# Patient Record
Sex: Male | Born: 1955 | Race: Black or African American | Hispanic: No | Marital: Married | Smoking: Former smoker
Health system: Southern US, Community
[De-identification: ages and names within clinical notes are randomized; demographics above are authoritative.]

## PROBLEM LIST (undated history)

## (undated) DIAGNOSIS — K2100 Gastro-esophageal reflux disease with esophagitis, without bleeding: Secondary | ICD-10-CM

## (undated) DIAGNOSIS — K219 Gastro-esophageal reflux disease without esophagitis: Secondary | ICD-10-CM

## (undated) DIAGNOSIS — R609 Edema, unspecified: Secondary | ICD-10-CM

## (undated) DIAGNOSIS — R079 Chest pain, unspecified: Secondary | ICD-10-CM

## (undated) DIAGNOSIS — J849 Interstitial pulmonary disease, unspecified: Secondary | ICD-10-CM

## (undated) DIAGNOSIS — Z720 Tobacco use: Secondary | ICD-10-CM

## (undated) DIAGNOSIS — K921 Melena: Secondary | ICD-10-CM

## (undated) DIAGNOSIS — N529 Male erectile dysfunction, unspecified: Secondary | ICD-10-CM

## (undated) DIAGNOSIS — F1010 Alcohol abuse, uncomplicated: Secondary | ICD-10-CM

## (undated) DIAGNOSIS — I351 Nonrheumatic aortic (valve) insufficiency: Secondary | ICD-10-CM

## (undated) DIAGNOSIS — R634 Abnormal weight loss: Secondary | ICD-10-CM

## (undated) DIAGNOSIS — D126 Benign neoplasm of colon, unspecified: Secondary | ICD-10-CM

## (undated) DIAGNOSIS — F17200 Nicotine dependence, unspecified, uncomplicated: Secondary | ICD-10-CM

## (undated) DIAGNOSIS — R911 Solitary pulmonary nodule: Secondary | ICD-10-CM

## (undated) DIAGNOSIS — D509 Iron deficiency anemia, unspecified: Secondary | ICD-10-CM

## (undated) DIAGNOSIS — D500 Iron deficiency anemia secondary to blood loss (chronic): Secondary | ICD-10-CM

## (undated) DIAGNOSIS — R64 Cachexia: Secondary | ICD-10-CM

## (undated) HISTORY — DX: Solitary pulmonary nodule: R91.1

## (undated) HISTORY — DX: Nicotine dependence, unspecified, uncomplicated: F17.200

## (undated) HISTORY — DX: Alcohol abuse, uncomplicated: F10.10

## (undated) HISTORY — DX: Iron deficiency anemia, unspecified: D50.9

## (undated) HISTORY — DX: Nonrheumatic aortic (valve) insufficiency: I35.1

## (undated) HISTORY — DX: Tobacco use: Z72.0

## (undated) HISTORY — DX: Edema, unspecified: R60.9

## (undated) HISTORY — DX: Gastro-esophageal reflux disease with esophagitis: K21.0

## (undated) HISTORY — DX: Male erectile dysfunction, unspecified: N52.9

## (undated) HISTORY — DX: Gastro-esophageal reflux disease with esophagitis, without bleeding: K21.00

## (undated) HISTORY — DX: Iron deficiency anemia secondary to blood loss (chronic): D50.0

## (undated) HISTORY — DX: Abnormal weight loss: R63.4

## (undated) HISTORY — DX: Interstitial pulmonary disease, unspecified: J84.9

## (undated) HISTORY — DX: Benign neoplasm of colon, unspecified: D12.6

## (undated) HISTORY — DX: Cachexia: R64

## (undated) SURGERY — EGD (ESOPHAGOGASTRODUODENOSCOPY)
Anesthesia: Moderate Sedation

---

## 2007-10-08 ENCOUNTER — Ambulatory Visit: Payer: Self-pay | Admitting: Family Medicine

## 2008-07-04 ENCOUNTER — Ambulatory Visit: Payer: Self-pay | Admitting: Family Medicine

## 2011-09-09 DIAGNOSIS — D500 Iron deficiency anemia secondary to blood loss (chronic): Secondary | ICD-10-CM

## 2011-09-09 HISTORY — DX: Iron deficiency anemia secondary to blood loss (chronic): D50.0

## 2011-10-01 ENCOUNTER — Inpatient Hospital Stay (HOSPITAL_COMMUNITY): Payer: Self-pay

## 2011-10-01 ENCOUNTER — Other Ambulatory Visit: Payer: Self-pay

## 2011-10-01 ENCOUNTER — Inpatient Hospital Stay (HOSPITAL_COMMUNITY)
Admission: EM | Admit: 2011-10-01 | Discharge: 2011-10-06 | DRG: 378 | Disposition: A | Discharge status: Home / Self Care | Payer: Self-pay | Attending: Internal Medicine | Admitting: Internal Medicine

## 2011-10-01 ENCOUNTER — Encounter (HOSPITAL_COMMUNITY): Payer: Self-pay | Admitting: Emergency Medicine

## 2011-10-01 ENCOUNTER — Emergency Department (HOSPITAL_COMMUNITY): Payer: Self-pay

## 2011-10-01 DIAGNOSIS — F1010 Alcohol abuse, uncomplicated: Secondary | ICD-10-CM | POA: Diagnosis present

## 2011-10-01 DIAGNOSIS — K222 Esophageal obstruction: Secondary | ICD-10-CM | POA: Diagnosis present

## 2011-10-01 DIAGNOSIS — I472 Ventricular tachycardia, unspecified: Secondary | ICD-10-CM | POA: Diagnosis not present

## 2011-10-01 DIAGNOSIS — R0600 Dyspnea, unspecified: Secondary | ICD-10-CM

## 2011-10-01 DIAGNOSIS — Z720 Tobacco use: Secondary | ICD-10-CM

## 2011-10-01 DIAGNOSIS — R634 Abnormal weight loss: Secondary | ICD-10-CM

## 2011-10-01 DIAGNOSIS — F17200 Nicotine dependence, unspecified, uncomplicated: Secondary | ICD-10-CM | POA: Diagnosis present

## 2011-10-01 DIAGNOSIS — K209 Esophagitis, unspecified without bleeding: Secondary | ICD-10-CM | POA: Diagnosis present

## 2011-10-01 DIAGNOSIS — R0989 Other specified symptoms and signs involving the circulatory and respiratory systems: Secondary | ICD-10-CM | POA: Diagnosis present

## 2011-10-01 DIAGNOSIS — I4729 Other ventricular tachycardia: Secondary | ICD-10-CM | POA: Diagnosis not present

## 2011-10-01 DIAGNOSIS — R0609 Other forms of dyspnea: Secondary | ICD-10-CM | POA: Diagnosis present

## 2011-10-01 DIAGNOSIS — D72829 Elevated white blood cell count, unspecified: Secondary | ICD-10-CM

## 2011-10-01 DIAGNOSIS — R1310 Dysphagia, unspecified: Secondary | ICD-10-CM | POA: Diagnosis present

## 2011-10-01 DIAGNOSIS — D126 Benign neoplasm of colon, unspecified: Secondary | ICD-10-CM | POA: Diagnosis present

## 2011-10-01 DIAGNOSIS — D500 Iron deficiency anemia secondary to blood loss (chronic): Secondary | ICD-10-CM | POA: Diagnosis present

## 2011-10-01 DIAGNOSIS — E872 Acidosis, unspecified: Secondary | ICD-10-CM | POA: Diagnosis present

## 2011-10-01 DIAGNOSIS — G8929 Other chronic pain: Secondary | ICD-10-CM | POA: Diagnosis present

## 2011-10-01 DIAGNOSIS — F1210 Cannabis abuse, uncomplicated: Secondary | ICD-10-CM | POA: Diagnosis present

## 2011-10-01 DIAGNOSIS — R531 Weakness: Secondary | ICD-10-CM

## 2011-10-01 DIAGNOSIS — E871 Hypo-osmolality and hyponatremia: Secondary | ICD-10-CM | POA: Diagnosis present

## 2011-10-01 DIAGNOSIS — E876 Hypokalemia: Secondary | ICD-10-CM | POA: Diagnosis present

## 2011-10-01 DIAGNOSIS — R911 Solitary pulmonary nodule: Secondary | ICD-10-CM | POA: Diagnosis present

## 2011-10-01 DIAGNOSIS — D509 Iron deficiency anemia, unspecified: Secondary | ICD-10-CM

## 2011-10-01 DIAGNOSIS — R195 Other fecal abnormalities: Secondary | ICD-10-CM

## 2011-10-01 DIAGNOSIS — D128 Benign neoplasm of rectum: Secondary | ICD-10-CM | POA: Diagnosis present

## 2011-10-01 DIAGNOSIS — K5730 Diverticulosis of large intestine without perforation or abscess without bleeding: Secondary | ICD-10-CM | POA: Diagnosis present

## 2011-10-01 DIAGNOSIS — K922 Gastrointestinal hemorrhage, unspecified: Principal | ICD-10-CM | POA: Diagnosis present

## 2011-10-01 HISTORY — DX: Gastro-esophageal reflux disease without esophagitis: K21.9

## 2011-10-01 LAB — RAPID URINE DRUG SCREEN, HOSP PERFORMED
Amphetamines: NOT DETECTED
Benzodiazepines: NOT DETECTED
Cocaine: NOT DETECTED
Opiates: NOT DETECTED
Tetrahydrocannabinol: POSITIVE — AB

## 2011-10-01 LAB — CBC
MCH: 13.8 pg — ABNORMAL LOW (ref 26.0–34.0)
MCV: 58.3 fL — ABNORMAL LOW (ref 78.0–100.0)
Platelets: 403 10*3/uL — ABNORMAL HIGH (ref 150–400)
RDW: 23.9 % — ABNORMAL HIGH (ref 11.5–15.5)
WBC: 25 10*3/uL — ABNORMAL HIGH (ref 4.0–10.5)

## 2011-10-01 LAB — BASIC METABOLIC PANEL
Calcium: 8.3 mg/dL — ABNORMAL LOW (ref 8.4–10.5)
Creatinine, Ser: 0.46 mg/dL — ABNORMAL LOW (ref 0.50–1.35)
GFR calc Af Amer: >90 mL/min (ref >90)
GFR calc non Af Amer: >90 mL/min (ref >90)
Sodium: 131 mEq/L — ABNORMAL LOW (ref 135–145)

## 2011-10-01 LAB — BLOOD GAS, ARTERIAL
Acid-base deficit: 1.4 mmol/L (ref 0.0–2.0)
Bicarbonate: 21.8 mEq/L (ref 20.0–24.0)
TCO2: 22.7 mmol/L (ref 0–100)
pCO2 arterial: 30.5 mmHg — ABNORMAL LOW (ref 35.0–45.0)
pO2, Arterial: 125 mmHg — ABNORMAL HIGH (ref 80.0–100.0)

## 2011-10-01 LAB — OCCULT BLOOD, POC DEVICE: Fecal Occult Bld: POSITIVE

## 2011-10-01 LAB — DIFFERENTIAL
Basophils Absolute: 0 10*3/uL (ref 0.0–0.1)
Lymphs Abs: 2.3 10*3/uL (ref 0.7–4.0)
Monocytes Absolute: 1.5 10*3/uL — ABNORMAL HIGH (ref 0.1–1.0)
Monocytes Relative: 6 % (ref 3–12)
Neutrophils Relative %: 84 % — ABNORMAL HIGH (ref 43–77)

## 2011-10-01 LAB — MRSA PCR SCREENING: MRSA by PCR: NEGATIVE

## 2011-10-01 LAB — ABO/RH: ABO/RH(D): O POS

## 2011-10-01 MED ORDER — ONDANSETRON HCL 4 MG/2ML IJ SOLN: 4.0000 mg | Freq: Four times a day (QID) | INTRAMUSCULAR | Status: DC | PRN

## 2011-10-01 MED ORDER — HYDROMORPHONE HCL PF 1 MG/ML IJ SOLN: 0.5000 mg | INTRAMUSCULAR | Status: AC | PRN

## 2011-10-01 MED ORDER — IOHEXOL 300 MG/ML  SOLN
90.0000 mL | Freq: Once | INTRAMUSCULAR | Status: AC | PRN
  Administered 2011-10-01: 90 mL via INTRAVENOUS

## 2011-10-01 MED ORDER — FOLIC ACID 1 MG PO TABS
1.0000 mg | ORAL_TABLET | Freq: Every day | ORAL | Status: DC
  Administered 2011-10-01 – 2011-10-06 (×5): 1 mg via ORAL
  Filled 2011-10-01 (×6): qty 1

## 2011-10-01 MED ORDER — NICOTINE 21 MG/24HR TD PT24
21.0000 mg | MEDICATED_PATCH | Freq: Every day | TRANSDERMAL | Status: DC
  Administered 2011-10-01 – 2011-10-06 (×6): 21 mg via TRANSDERMAL
  Filled 2011-10-01 (×6): qty 1

## 2011-10-01 MED ORDER — PANTOPRAZOLE SODIUM 40 MG PO TBEC
40.0000 mg | DELAYED_RELEASE_TABLET | Freq: Every day | ORAL | Status: DC
  Administered 2011-10-02 – 2011-10-06 (×5): 40 mg via ORAL
  Filled 2011-10-01 (×5): qty 1

## 2011-10-01 MED ORDER — VITAMIN B-1 100 MG PO TABS
100.0000 mg | ORAL_TABLET | Freq: Every day | ORAL | Status: DC
  Administered 2011-10-01 – 2011-10-06 (×5): 100 mg via ORAL
  Filled 2011-10-01 (×6): qty 1

## 2011-10-01 MED ORDER — POTASSIUM CHLORIDE 10 MEQ/100ML IV SOLN
10.0000 meq | INTRAVENOUS | Status: AC
  Administered 2011-10-01 (×4): 10 meq via INTRAVENOUS
  Filled 2011-10-01 (×4): qty 100

## 2011-10-01 MED ORDER — ALBUTEROL SULFATE (5 MG/ML) 0.5% IN NEBU: 2.5000 mg | INHALATION_SOLUTION | RESPIRATORY_TRACT | Status: AC | PRN

## 2011-10-01 MED ORDER — ONDANSETRON HCL 4 MG/2ML IJ SOLN: 4.0000 mg | Freq: Three times a day (TID) | INTRAMUSCULAR | Status: DC | PRN

## 2011-10-01 MED ORDER — ENSURE CLINICAL ST REVIGOR PO LIQD
237.0000 mL | Freq: Three times a day (TID) | ORAL | Status: DC
  Administered 2011-10-01 – 2011-10-06 (×10): 237 mL via ORAL

## 2011-10-01 MED ORDER — ONDANSETRON HCL 4 MG PO TABS: 4.0000 mg | ORAL_TABLET | Freq: Four times a day (QID) | ORAL | Status: DC | PRN

## 2011-10-01 MED ORDER — ACETAMINOPHEN 650 MG RE SUPP: 650.0000 mg | Freq: Four times a day (QID) | RECTAL | Status: DC | PRN

## 2011-10-01 MED ORDER — ACETAMINOPHEN 325 MG PO TABS
650.0000 mg | ORAL_TABLET | Freq: Four times a day (QID) | ORAL | Status: DC | PRN
  Administered 2011-10-01: 325 mg via ORAL
  Filled 2011-10-01: qty 1

## 2011-10-01 MED ORDER — SODIUM CHLORIDE 0.9 % IV SOLN
Freq: Once | INTRAVENOUS | Status: AC
  Administered 2011-10-01: 12:00:00 via INTRAVENOUS

## 2011-10-01 MED ORDER — SODIUM CHLORIDE 0.9 % IV SOLN
INTRAVENOUS | Status: AC
  Administered 2011-10-01: 1000 mL via INTRAVENOUS

## 2011-10-01 MED ORDER — ADULT MULTIVITAMIN W/MINERALS CH
1.0000 | ORAL_TABLET | Freq: Every day | ORAL | Status: DC
  Administered 2011-10-01 – 2011-10-06 (×5): 1 via ORAL
  Filled 2011-10-01 (×6): qty 1

## 2011-10-01 MED ORDER — PANTOPRAZOLE SODIUM 40 MG IV SOLR
40.0000 mg | Freq: Once | INTRAVENOUS | Status: AC
  Administered 2011-10-01: 40 mg via INTRAVENOUS
  Filled 2011-10-01: qty 40

## 2011-10-01 MED ORDER — BISACODYL 5 MG PO TBEC
10.0000 mg | DELAYED_RELEASE_TABLET | Freq: Four times a day (QID) | ORAL | Status: DC
  Filled 2011-10-01 (×3): qty 2

## 2011-10-01 MED ORDER — PANTOPRAZOLE SODIUM 40 MG IV SOLR
40.0000 mg | INTRAVENOUS | Status: DC
  Filled 2011-10-01: qty 40

## 2011-10-01 MED ORDER — POLYETHYLENE GLYCOL 3350 17 GM/SCOOP PO POWD
1.0000 | Freq: Once | ORAL | Status: DC
  Filled 2011-10-01: qty 255

## 2011-10-01 NOTE — Progress Notes (Signed)
INITIAL ADULT NUTRITION ASSESSMENT Date: 10/01/2011   Time: 4:43 PM  Reason for Assessment: Nutrition Consult  ASSESSMENT: Male 56 y.o.  Dx: Anemia due to GI blood loss, microcytic anemia, leukocytosis- GI has seen with plans for EGD and Colonoscopy Friday  Hx:  Past Medical History  Diagnosis Date  . No pertinent past medical history     Related Meds:     . sodium chloride   Intravenous Once  . sodium chloride   Intravenous STAT  . bisacodyl  10 mg Oral Q6H  . folic acid  1 mg Oral Daily  . mulitivitamin with minerals  1 tablet Oral Daily  . nicotine  21 mg Transdermal Daily  . pantoprazole  40 mg Oral Q0600  . pantoprazole (PROTONIX) IV  40 mg Intravenous Once  . polyethylene glycol powder  1 Container Oral Once  . potassium chloride  10 mEq Intravenous Q1 Hr x 4  . thiamine  100 mg Oral Daily  . DISCONTD: pantoprazole (PROTONIX) IV  40 mg Intravenous Q24H     Ht: 5\' 9"  (175.3 cm)  Wt: 97 lb 3.6 oz (44.1 kg)  Ideal Wt:  73.6 kg % Ideal Wt: 59.9%  Usual Wt: 65.9 kg % Usual Wt: 66.9%  Body mass index is 14.36 kg/(m^2). Underweight  Food/Nutrition Related Hx: Patient reported a moderate appetite, but experiences an "acid-reflux" like pain in chest that makes it difficult to keep foods down. He also stated that his coughing interfered with his ease of swallowing foods.  He reported a 48 lb weight loss over a year.  Labs:  CMP     Component Value Date/Time   NA 131* 10/01/2011 1008   K 3.1* 10/01/2011 1008   CL 92* 10/01/2011 1008   CO2 13* 10/01/2011 1008   GLUCOSE 123* 10/01/2011 1008   BUN 4* 10/01/2011 1008   CREATININE 0.46* 10/01/2011 1008   CALCIUM 8.3* 10/01/2011 1008   GFRNONAA >90 10/01/2011 1008   GFRAA >90 10/01/2011 1008    Intake:   Total I/O In: 820 [P.O.:120; Blood:700] Out: -    Diet Order: Regular  Supplements/Tube Feeding: N/A  IVF: N/A  Estimated Nutritional Needs:   Kcal:1500-1600 kcal Protein: 75-85 gm Fluid: > 1.5 L  Patient  reported to consume most foods at home, but needs to eat foods in small pieces to assist with swallowing. He seemed eager to eat lunch tray.    Patient weight loss of 33.1% usual body weight over one year and < 75% of estimated energy requirements meets criteria for severe malnutrition in context of chronic illness  NUTRITION DIAGNOSIS: -Inadequate oral intake (NI-2.1).  Status: Ongoing  RELATED TO: difficulty keeping foods down/altered GI function  AS EVIDENCE BY: 48lb weight loss over year  MONITORING/EVALUATION(Goals): Goal: Consume >/= 90% of estimated needs Monitor: PO intake, weight, electrolytes  EDUCATION NEEDS: -No education needs identified at this time  INTERVENTION: Supplement Ensure Complete with meal TID  Dietitian #:[phone]  DOCUMENTATION CODES Per approved criteria  -Severe malnutrition in the context of chronic illness -Underweight    Lloyd Huger 10/01/2011, 4:43 PM Derrell Lolling Anastasia Fiedler [phone]

## 2011-10-01 NOTE — ED Notes (Signed)
Pt back to hallway, sample to lab per PA.

## 2011-10-01 NOTE — Consult Note (Addendum)
[facility] Consult: 2:25 PM 10/01/2011   Referring Provider: S. Cleotis Lema  Primary Care Physician:  Dr. Susann Givens, but pt lost his medical insurance so has not seen him in over one year. Primary Gastroenterologist:   Gentry Fitz.  Reason for Consultation:  Microcytic anemia, Hgb of 3.5.  Stool FOB positive.   HPI: James [name] is a 56 y.o. male.  Unremarkable past medical and surgical history.  For several months having progressive DOE/fatigue.  Also several months of severe pyrosis, early satiety, solid dysphagia.  More recently having nocturnal vomiting which is non-bloody.  Today, on way to courthouse to deal with home forclosure proceedings, was unable to take more than a few steps without profound exhaustion and SOB.  He was dizzy.  No chest pain.  At ED Hgb is 3.5 with MCV in the 50s.  Starting on transfusions of 4 ordered PRBCs.  BUN is normal. Reports average weight a few years back of 155, currently 97 #, BMI 14.    He does not take ASA or NSAIDS, drinks 2 beers a day.  No unusual blood clotting problems or increased bruising.  No prior hx of abnormal liver tests or anemia.   No family hx of stomach or intestinal problems.   Past Medical History  Diagnosis Date  . No pertinent past medical history     History reviewed. No pertinent past surgical history.  Prior to Admission medications   None    Scheduled Meds:    . sodium chloride   Intravenous Once  . sodium chloride   Intravenous STAT  . folic acid  1 mg Oral Daily  . mulitivitamin with minerals  1 tablet Oral Daily  . pantoprazole (PROTONIX) IV  40 mg Intravenous Once  . pantoprazole (PROTONIX) IV  40 mg Intravenous Q24H  . potassium chloride  10 mEq Intravenous Q1 Hr x 4  . thiamine  100 mg Oral Daily   Infusions:   PRN Meds: acetaminophen, acetaminophen, albuterol, HYDROmorphone (DILAUDID) injection, ondansetron (ZOFRAN) IV, ondansetron, DISCONTD: ondansetron (ZOFRAN)  IV   Allergies as of 10/01/2011  . (No Known Allergies)    History reviewed. No pertinent family history.  History   Social History  . Marital Status: Married    Spouse Name: N/A    Number of Children: N/A  . Years of Education: N/A   Occupational History  . Unemployed, previously worked as a Facilities manager   Social History Main Topics  . Smoking status: Current Everyday Smoker -- 1.0 packs/day for 20 years    Types: Cigarettes  . Smokeless tobacco: Never Used  . Alcohol Use: 1.2 oz/week    2 Cans of beer per day  . Drug Use: No  . Sexually Active: Not Currently   Social History Narrative  . His home is being forclosed (Jan 2013)    REVIEW OF SYSTEMS: Constitutional:  above ENT:  No nose bleeding Pulm:  No cough CV:  Tachycardia with minor exertion.  No edema. GU:  No dark colored or bloody urine. GI:  Above. Heme:  above.    Transfusions:  never Neuro:  No headaches.  Dizzyness in last few days. Derm:  No rash, itching or sores Endocrine:  No history blood sugar problems Immunization:  Not current on flu shot. Travel:  None.   PHYSICAL EXAM: Vital signs in last 24 hours: Temp:  [97.4 F (36.3 C)-98 F (36.7 C)] 97.6 F (36.4 C) (01/23 1415) Pulse Rate:  [96-114] 96  (01/23 1415) Resp:  [15-19]  16  (01/23 1415) BP: (94-133)/(52-78) 108/58 mmHg (01/23 1415) SpO2:  [100 %] 100 % (01/23 1400) Weight:  [44.1 kg (97 lb 3.6 oz)-58.968 kg (130 lb)] 44.1 kg (97 lb 3.6 oz) (01/23 1321)  Last BM Date: 09/30/11  General: cachectic, pale AA man.  Looks chronically unwell. Head:  Normocephallic, atraumatic  Eyes:  Pale conjunctiva, muddy sclera Ears:  Not HOH  Nose:  No discharge. Mouth:  Missing teeth, pink and moist MM Neck:  No JVD.  No mass, no goiter Lungs:  Clear B.  Not dyspneic, no cough Heart: Regular rhythm.  Tacchy.  Slight systolic murmur. Abdomen:  Soft, NT, fullness vs mass in mid abdomen.  Active BS.  No HSM Rectal: dark brown, non-melenic but  trace FOB positive stool.   Musc/Skeltl: no swollen or deformed joints.   Extremities:  Muscle wasting of limbs Neurologic:  No tremor, oriented x 3.  Moves all 4s. Skin:  Pale, no rash or sores Tattoos:  none Nodes:  None at neck   Psych:  Pleasant, not anxious.  Cooperative.  LAB RESULTS:  Basename 10/01/11 1008  WBC 25.0*  HGB 3.5*  HCT 14.8*  PLT 403*   BMET Lab Results  Component Value Date   NA 131* 10/01/2011   K 3.1* 10/01/2011   CL 92* 10/01/2011   CO2 13* 10/01/2011   GLUCOSE 123* 10/01/2011   BUN 4* 10/01/2011   CREATININE 0.46* 10/01/2011   CALCIUM 8.3* 10/01/2011   LFT No results found for this basename: prot, albumin, ast, alt, alkphos, bilitot, bilidir, ibili   PT/INR No results found for this basename: INR, PROTIME   Hepatitis Panel No results found for this basename: HEPBSAG,HCVAB,HEPAIGM,HEPBIGM in the last 72 hours C-Diff No components found with this basename: cdiff    Drugs of Abuse  No results found for this basename: labopia, cocainscrnur, labbenz, amphetmu, thcu, labbarb     RADIOLOGY STUDIES: Dg Chest 2 View  10/01/2011  *RADIOLOGY REPORT*  Clinical Data: Shortness of breath, smoker.  CHEST - 2 VIEW  Comparison: None  Findings: Heart and mediastinal contours are within normal limits. No focal opacities or effusions.  No acute bony abnormality.  IMPRESSION: No active cardiopulmonary disease.  Original Report Authenticated By: Cyndie Chime, M.D.    ENDOSCOPIC STUDIES: Flex sig within last 5 years at Dr Jola Babinski office.  IMPRESSION: 1.  Profound, symptomatic microcytic anemia.  4 units of PRBCs ordered to transfuse. 2.  Heme positive stool.  GI ROS suggests upper GI source (pyrosis, dysphagia, early satiety, nocturnal vomiting).  He has not ever had a full colonoscopy however.  His weight loss is concerning for a cancer.  3.  Hypokalemia and hyponatremia.  4.  Leukocytosis. ? Reactive.  No sxs to suggest active infection.  Afebrile.  5.  Mild  hyperglycemia.   PLAN: 1.  EGD and Colonoscopy Friday at noon.   2.  Protime/inr and LFTs today.  3.  PPI coverage with once daily protonix, will change to pills. 4.  Solid food today, start clears tomorrow.      LOS: 0 days   Jennye Moccasin  10/01/2011, 2:25 PM Pager: [phone]  GI ATTENDING  HISTORY REVIEWED. PATIENT SEEN AND EXAMINED. LABS AND XRAYS REVIEWED. FAMILY IN ROOM. AGREE W/ ABOVE H/P. PRESENTS WITH PROFOUND MICROCYTIC ANEMIA, 30# WEIGHT LOSS, DYSPHAGIA, AND HEME+ STOOL. CHRONIC SMOKER / DRINKER. I'M VERY CONCERNED ABOUT ESOPHAGEAL CANCER. PLAN EGD TOMORROW. IF NEGATIVE, COLONOSCOPY Friday. The nature of the procedure, as well as the  risks, benefits, and alternatives were carefully and thoroughly reviewed with the patient. Ample time for discussion and questions allowed. The patient understood, was satisfied, and agreed to proceed.   Wilhemina Bonito. Eda Keys., M.D. [facility] Division of Gastroenterology       NO INTERVAL CHANGE. FOR EGD. Wilhemina Bonito. Eda Keys., M.D. [facility] Division of Gastroenterology

## 2011-10-01 NOTE — H&P (Addendum)
PCP:  Sharlot Gowda DOA:  10/01/2011  9:28 AM  Chief Complaint:  Generalized weakness and dyspnea  HPI: James [name] is a 56 years old African American man with no significant past medical history presented to the ER today with chief complaint of generalized weakness and dyspnea with exertion. He denies any cough, nasal congestion, fever or chills. He stated that he noticed dark stool for about a month. But denies any red blood per rectum. He also denies any hematemesis or abdominal pain. Patient stated that he had a colonoscopy about 5 years ago and was told was unremarkable. He noticed about 30 pounds weight loss over the last couple of years. He was last seen by a physician about 3 years ago. In the ED patient was found to be anemic with a hemoglobin of 3.5, his stool was positive for occult blood. I was called to admit for further management.  Allergies: No Known Allergies  Prior to Admission medications   None     Past Medical History  Diagnosis Date  . No pertinent past medical history     Family History : Denies family history of cancers or GI pathology. Positive family history for emphysema. Social History: Lives with his wife, reports that he has been smoking Cigarettes.  He has a 20 pack-year smoking history.Marland Kitchen He reports that he drinks about 3-4 beers 3-4 days a week . He reports that he does not use illicit drugs.  History reviewed. No pertinent family history.  Review of Systems:  Constitutional: Denies fever, chills, diaphoresis, appetite change  HEENT: Denies photophobia, eye pain, redness, hearing loss, ear pain, congestion, sore throat, rhinorrhea, sneezing, mouth sores, trouble swallowing, neck pain, neck stiffness and tinnitus.   Respiratory: C/O  SOB, DOE, denies cough, chest tightness,  and wheezing.   Cardiovascular: Denies chest pain, palpitations and leg swelling.  Gastrointestinal: Denies nausea, vomiting, abdominal pain, diarrhea, constipation, abdominal  distention.  complaint of black stools Genitourinary: Denies dysuria, urgency, frequency, hematuria, flank pain and difficulty urinating.  Musculoskeletal: Denies myalgias,  joint swelling, arthralgias and gait problem.  complain of lower back pain on and off for more than a year  Neurological: Denies dizziness, seizures, syncope, focal weakness, light-headedness, numbness and headaches.  Hematological: Denies adenopathy. Easy bruising, personal or family bleeding history .complain of occasional nosebleed with blowing his nose  Psychiatric/Behavioral: Denies suicidal ideation, mood changes, confusion, nervousness, sleep disturbance and agitation   Physical Exam:  Filed Vitals:   10/01/11 1242 10/01/11 1321 10/01/11 1330 10/01/11 1345  BP: 94/52 115/67 113/70 122/70  Pulse: 103 103 109 109  Temp: 97.7 F (36.5 C) 97.4 F (36.3 C)    TempSrc: Oral Oral    Resp: 18 18 19 16   Height:  5\' 9"  (1.753 m)    Weight:  44.1 kg (97 lb 3.6 oz)    SpO2:  100% 100% 100%    Constitutional: Vital signs reviewed.  Patient is cachectic in no acute distress and cooperative with exam. Alert and oriented x3.  Head: Normocephalic and atraumatic Ear: TM normal bilaterally Mouth: no erythema or exudates, MMM Eyes: PERRL, EOMI, conjunctivae normal, No scleral icterus.  pale Neck: Supple, Trachea midline normal ROM, No JVD, mass, thyromegaly, or carotid bruit present.  Cardiovascular: RRR, S1 normal, S2 normal, tachycardic no MRG, pulses symmetric and intact bilaterally Pulmonary/Chest: CTAB, no wheezes, rales, or rhonchi Abdominal: Soft. Non-tender, non-distended, bowel sounds are normal, no masses, organomegaly, or guarding present.  GU: no CVA tenderness Musculoskeletal: No joint deformities,  erythema, or stiffness, ROM full and no nontender Ext: no edema and no cyanosis, pulses palpable bilaterally (DP and PT) Neurological: A&O x3, Strenght is normal and symmetric bilaterally, cranial nerve II-XII are  grossly intact, no focal motor deficit, sensory intact to light touch bilaterally.    Labs on Admission:  Results for orders placed during the hospital encounter of 10/01/11 (from the past 48 hour(s))  D-DIMER, QUANTITATIVE     Status: Abnormal   Collection Time   10/01/11  9:55 AM      Component Value Range Comment   D-Dimer, Quant 1.33 (*) 0.00 - 0.48 (ug/mL-FEU)   CBC     Status: Abnormal   Collection Time   10/01/11 10:08 AM      Component Value Range Comment   WBC 25.0 (*) 4.0 - 10.5 (K/uL)    RBC 2.54 (*) 4.22 - 5.81 (MIL/uL)    Hemoglobin 3.5 (*) 13.0 - 17.0 (g/dL)    HCT 96.0 (*) 45.4 - 52.0 (%)    MCV 58.3 (*) 78.0 - 100.0 (fL)    MCH 13.8 (*) 26.0 - 34.0 (pg)    MCHC 23.6 (*) 30.0 - 36.0 (g/dL)    RDW 09.8 (*) 11.9 - 15.5 (%)    Platelets 403 (*) 150 - 400 (K/uL)   DIFFERENTIAL     Status: Abnormal   Collection Time   10/01/11 10:08 AM      Component Value Range Comment   Neutrophils Relative 84 (*) 43 - 77 (%)    Lymphocytes Relative 9 (*) 12 - 46 (%)    Monocytes Relative 6  3 - 12 (%)    Eosinophils Relative 1  0 - 5 (%)    Basophils Relative 0  0 - 1 (%)    Neutro Abs 20.9 (*) 1.7 - 7.7 (K/uL)    Lymphs Abs 2.3  0.7 - 4.0 (K/uL)    Monocytes Absolute 1.5 (*) 0.1 - 1.0 (K/uL)    Eosinophils Absolute 0.3  0.0 - 0.7 (K/uL)    Basophils Absolute 0.0  0.0 - 0.1 (K/uL)    RBC Morphology POLYCHROMASIA PRESENT     BASIC METABOLIC PANEL     Status: Abnormal   Collection Time   10/01/11 10:08 AM      Component Value Range Comment   Sodium 131 (*) 135 - 145 (mEq/L)    Potassium 3.1 (*) 3.5 - 5.1 (mEq/L)    Chloride 92 (*) 96 - 112 (mEq/L)    CO2 13 (*) 19 - 32 (mEq/L)    Glucose, Bld 123 (*) 70 - 99 (mg/dL)    BUN 4 (*) 6 - 23 (mg/dL)    Creatinine, Ser 1.47 (*) 0.50 - 1.35 (mg/dL)    Calcium 8.3 (*) 8.4 - 10.5 (mg/dL)    GFR calc non Af Amer >90  >90 (mL/min)    GFR calc Af Amer >90  >90 (mL/min)   TYPE AND SCREEN     Status: Normal (Preliminary result)    Collection Time   10/01/11 11:10 AM      Component Value Range Comment   ABO/RH(D) O POS      Antibody Screen NEG      Sample Expiration 10/04/2011      Unit Number [id number]      Blood Component Type RED CELLS,LR      Unit division 00      Status of Unit ISSUED      Transfusion Status OK TO TRANSFUSE  Crossmatch Result Compatible      Unit Number [id number]      Blood Component Type RED CELLS,LR      Unit division 00      Status of Unit ALLOCATED      Transfusion Status OK TO TRANSFUSE      Crossmatch Result Compatible      Unit Number [id number]      Blood Component Type RED CELLS,LR      Unit division 00      Status of Unit ALLOCATED      Transfusion Status OK TO TRANSFUSE      Crossmatch Result Compatible      Unit Number [id number]      Blood Component Type RED CELLS,LR      Unit division 00      Status of Unit ALLOCATED      Transfusion Status OK TO TRANSFUSE      Crossmatch Result Compatible      Unit Number [id number]      Blood Component Type RED CELLS,LR      Unit division 00      Status of Unit ALLOCATED      Transfusion Status OK TO TRANSFUSE      Crossmatch Result Compatible      Unit Number [id number]      Blood Component Type RED CELLS,LR      Unit division 00      Status of Unit ALLOCATED      Transfusion Status OK TO TRANSFUSE      Crossmatch Result Compatible     PREPARE RBC (CROSSMATCH)     Status: Normal   Collection Time   10/01/11 11:10 AM      Component Value Range Comment   Order Confirmation ORDER PROCESSED BY BLOOD BANK     ABO/RH     Status: Normal   Collection Time   10/01/11 11:10 AM      Component Value Range Comment   ABO/RH(D) O POS     OCCULT BLOOD, POC DEVICE     Status: Normal   Collection Time   10/01/11 11:57 AM      Component Value Range Comment   Fecal Occult Bld POSITIVE       Radiological Exams on Admission: CHEST - 2 VIEW  Comparison: None  Findings: Heart and mediastinal contours are within normal limits.  No focal  opacities or effusions. No acute bony abnormality.  IMPRESSION:  No active cardiopulmonary disease.   Assessment/Plan Principal Problem:  *Anemia due to GI blood loss Transfuse 4 units of packed RBCs, monitor serial H&H Would keep patient on clear liquid diet for now, continue Protonix IV LB  GI was consulted Active Problems:  Weakness generalized Most likely secondary to symptomatic anemia. Request PT eval when more stable. Anion gap metabolic acidosis Possibly secondary to alcohol versus compensatory mechanism for her respiratory process. Patient denies any ingestion of any substance other than alcohol. He also denies any diarrhea which should give non-anion gap acidosis. will check ethanol level, will check lactic acid level, acetaminophen and aspirin level. I would also check serum osmolality to calculate osmolar gap. Check ABG and urine drug screen.  Hypokalemia Replete, check magnesium level  Hyponatremia Probably secondary to dehydration, however SIADH is not excluded given his smoking history Check urine sodium, urine osmolality. Add on serum osmolality, check TSH and random cortisol. Continue to monitor sodium level and treat according to studies results.  Dyspnea Probably secondary to symptomatic anemia however d-dimer  was also noted to be elevated, will check CTA chest to rule out PE. Leukocytosis Most likely reactive secondary to severe anemia, patient is afebrile we'll continue to observe. Chronic back pain No neurological deficit appreciated on exam. Given severe anemia I will also check urine and serum protein electrophoresis. Consider imaging study if any worsening. Weight loss Rule out malignant process. Would start with GI evaluation as above if no abnormality consider CT chest and abdomen. Alcohol abuse Will place patient on CIWA protocol, folate, thiamine and multivitamin. Tobacco abuse Would order for nicotine patch, smoking cessation counseling CODE  STATUS Full code   Time Spent on Admission: 50 minutes  Dashay Giesler 10/01/2011, 1:49 PM

## 2011-10-01 NOTE — ED Notes (Signed)
Lab results reported to PA. 

## 2011-10-01 NOTE — ED Notes (Signed)
Pt moved to room cdu 2 for PA to do rectal exam and will return to hallway.

## 2011-10-01 NOTE — Progress Notes (Signed)
Pt to CT at 1805hrs, CT tolerated without problem, RN with pt.  Returned from CT at 1845hrs.  3rd unit of blood to start.

## 2011-10-01 NOTE — ED Notes (Signed)
Pt is very thin but states is his "normal".

## 2011-10-01 NOTE — ED Provider Notes (Signed)
History     CSN: [id number]  Arrival date & time 10/01/11  1478   First MD Initiated Contact with Patient 10/01/11 [phone]      Chief Complaint  Patient presents with  . Shortness of Breath    sob and general weakness on and off for "a while' but worse this morning. denies any medical history.    (Consider location/radiation/quality/duration/timing/severity/associated sxs/prior treatment) Patient is a 56 y.o. male presenting with shortness of breath. The history is provided by the patient.  Shortness of Breath  The current episode started more than 2 weeks ago. The onset was gradual. The problem occurs frequently. The problem has been gradually worsening. The problem is moderate. The symptoms are relieved by rest. The symptoms are aggravated by activity. Associated symptoms include cough and shortness of breath. Pertinent negatives include no chest pain, no chest pressure, no orthopnea, no fever, no rhinorrhea, no sore throat, no stridor and no wheezing. He has had no prior hospitalizations. His past medical history does not include asthma or past wheezing. There were no sick contacts.   Pt has had dyspnea over the past several weeks which has been worsening. States this gets worse with activity and improves with rest. He does feel that since this started, he has gradually become more winded with activity and has to rest more frequently when walking. He feels generally weak.  He is a smoker, smoking approx 1 ppd. Has a morning cough at baseline; has not noted any change in this. Denies hx of cardiopulmonary disease or other chronic medical conditions. Denies fevers.  Patient has no prior history of DVT/PE. Denies recent trauma, surgery, or prolonged immobilization. Denies hemoptysis. Denies leg swelling/pain.  History reviewed. No pertinent past medical history.  History reviewed. No pertinent past surgical history.  History reviewed. No pertinent family history.  History  Substance Use  Topics  . Smoking status: Current Everyday Smoker -- 1.0 packs/day  . Smokeless tobacco: Not on file  . Alcohol Use: 1.2 oz/week    2 Cans of beer per week      Review of Systems  Constitutional: Negative for fever, chills, appetite change and unexpected weight change.  HENT: Negative for congestion, sore throat and rhinorrhea.   Respiratory: Positive for cough and shortness of breath. Negative for chest tightness, wheezing and stridor.   Cardiovascular: Negative for chest pain, palpitations and orthopnea.  Gastrointestinal: Negative for vomiting, abdominal pain and blood in stool.  Genitourinary: Negative for dysuria.  Musculoskeletal: Negative for myalgias.  Skin: Negative for color change.  Neurological: Positive for weakness.    Allergies  Review of patient's allergies indicates no known allergies.  Home Medications  No current outpatient prescriptions on file.  BP 133/60  Pulse 112  Temp(Src) 97.9 F (36.6 C) (Oral)  Resp 18  Ht 5\' 9"  (1.753 m)  Wt 130 lb (58.968 kg)  BMI 19.20 kg/m2  SpO2 100%  Physical Exam  Nursing note and vitals reviewed. Constitutional: He is oriented to person, place, and time. He appears well-developed. No distress.       Cachetic appearing. Pt states this is normal for him.  HENT:  Head: Normocephalic and atraumatic.  Right Ear: External ear normal.  Left Ear: External ear normal.  Eyes: EOM are normal.  Neck: Normal range of motion.  Cardiovascular: Normal rate, regular rhythm and normal heart sounds.  Exam reveals no gallop and no friction rub.   No murmur heard. Pulmonary/Chest: Effort normal and breath sounds normal. No respiratory distress.  He has no wheezes. He has no rales. He exhibits no tenderness.  Abdominal: Soft. There is no tenderness.  Genitourinary: Rectal exam shows no mass. Guaiac positive stool.       RN chaperone present during exam Dark brown stool seen  Musculoskeletal: Normal range of motion. He exhibits no  edema.  Neurological: He is alert and oriented to person, place, and time.  Skin: Skin is warm and dry. He is not diaphoretic.  Psychiatric: He has a normal mood and affect.    ED Course  Procedures (including critical care time)   Date: 10/01/2011 0958  Rate: 105  Rhythm: sinus tachycardia  QRS Axis: normal  Intervals: normal  ST/T Wave abnormalities: normal  Conduction Disutrbances:none  Narrative Interpretation: "noisy" appearing ECG. Questionable R atrial enlargement.  Old EKG Reviewed: none available  Labs Reviewed  D-DIMER, QUANTITATIVE - Abnormal; Notable for the following:    D-Dimer, Quant 1.33 (*)    All other components within normal limits  CBC - Abnormal; Notable for the following:    WBC 25.0 (*)    RBC 2.54 (*)    Hemoglobin 3.5 (*)    HCT 14.8 (*)    MCV 58.3 (*)    MCH 13.8 (*)    MCHC 23.6 (*)    RDW 23.9 (*)    Platelets 403 (*)    All other components within normal limits  DIFFERENTIAL - Abnormal; Notable for the following:    Neutrophils Relative 84 (*)    Lymphocytes Relative 9 (*)    Neutro Abs 20.9 (*)    Monocytes Absolute 1.5 (*)    All other components within normal limits  BASIC METABOLIC PANEL - Abnormal; Notable for the following:    Sodium 131 (*)    Potassium 3.1 (*)    Chloride 92 (*)    CO2 13 (*)    Glucose, Bld 123 (*)    BUN 4 (*)    Creatinine, Ser 0.46 (*)    Calcium 8.3 (*)    All other components within normal limits  TYPE AND SCREEN  PREPARE RBC (CROSSMATCH)  ABO/RH  OCCULT BLOOD, POC DEVICE  POCT OCCULT BLOOD STOOL, DEVICE   Dg Chest 2 View  10/01/2011  *RADIOLOGY REPORT*  Clinical Data: Shortness of breath, smoker.  CHEST - 2 VIEW  Comparison: None  Findings: Heart and mediastinal contours are within normal limits. No focal opacities or effusions.  No acute bony abnormality.  IMPRESSION: No active cardiopulmonary disease.  Original Report Authenticated By: Cyndie Chime, M.D.     1. Anemia due to GI blood loss         MDM  12:34 PM Pt with hemoglobin of 3.5 which is the most likely explanation for his dyspnea. Stool hemoccult positive but no evidence of gross blood. Pt denies any abd pain though states he has had looser stools lately. Denies rectal pain or BRBPR. D dimer is elevated which was ordered before H/H value was known; suspect this is attributable to H/H. Do not feel that CT angio chest clinically necessary at this point. Discussed with [facility]. Will admit to stepdown given hgb. GI consult requested. Page placed.        Grant Fontana, [place] 10/01/11 [phone]

## 2011-10-01 NOTE — Progress Notes (Signed)
2 units of blood infused, CT of abd to be done.  Per Dr. Cleotis Lema will do now and then start 3rd unit of blood.

## 2011-10-01 NOTE — ED Notes (Signed)
Pt states general weakness and sob for several weeks. Denies cough. Denies fever

## 2011-10-02 ENCOUNTER — Encounter (HOSPITAL_COMMUNITY): Payer: Self-pay

## 2011-10-02 ENCOUNTER — Encounter (HOSPITAL_COMMUNITY)
Admission: EM | Disposition: A | Discharge status: Home / Self Care | Payer: Self-pay | Source: Home / Self Care | Attending: Internal Medicine

## 2011-10-02 DIAGNOSIS — D500 Iron deficiency anemia secondary to blood loss (chronic): Secondary | ICD-10-CM

## 2011-10-02 HISTORY — PX: ESOPHAGOGASTRODUODENOSCOPY: SHX5428

## 2011-10-02 LAB — CBC
HCT: 29.3 % — ABNORMAL LOW (ref 39.0–52.0)
Hemoglobin: 9.7 g/dL — ABNORMAL LOW (ref 13.0–17.0)
MCH: 22.8 pg — ABNORMAL LOW (ref 26.0–34.0)
MCHC: 32 g/dL (ref 30.0–36.0)
MCV: 71.8 fL — ABNORMAL LOW (ref 78.0–100.0)
Platelets: 276 10*3/uL (ref 150–400)
RBC: 4.08 MIL/uL — ABNORMAL LOW (ref 4.22–5.81)
RDW: 28.7 % — ABNORMAL HIGH (ref 11.5–15.5)
WBC: 19.2 10*3/uL — ABNORMAL HIGH (ref 4.0–10.5)

## 2011-10-02 LAB — COMPREHENSIVE METABOLIC PANEL
ALT: 17 U/L (ref 0–53)
AST: 33 U/L (ref 0–37)
Alkaline Phosphatase: 262 U/L — ABNORMAL HIGH (ref 39–117)
CO2: 24 mEq/L (ref 19–32)
Chloride: 99 mEq/L (ref 96–112)
GFR calc non Af Amer: >90 mL/min (ref >90)
Glucose, Bld: 91 mg/dL (ref 70–99)
Sodium: 131 mEq/L — ABNORMAL LOW (ref 135–145)
Total Bilirubin: 1.4 mg/dL — ABNORMAL HIGH (ref 0.3–1.2)

## 2011-10-02 LAB — LACTIC ACID, PLASMA: Lactic Acid, Venous: 1.3 mmol/L (ref 0.5–2.2)

## 2011-10-02 LAB — TSH: TSH: 4.302 u[IU]/mL (ref 0.350–4.500)

## 2011-10-02 LAB — ETHANOL: Alcohol, Ethyl (B): <11 mg/dL (ref 0–11)

## 2011-10-02 LAB — SALICYLATE LEVEL: Salicylate Lvl: <2 mg/dL — ABNORMAL LOW (ref 2.8–20.0)

## 2011-10-02 SURGERY — EGD (ESOPHAGOGASTRODUODENOSCOPY)
Anesthesia: Moderate Sedation

## 2011-10-02 MED ORDER — FENTANYL CITRATE 0.05 MG/ML IJ SOLN
INTRAMUSCULAR | Status: AC
  Filled 2011-10-02: qty 4

## 2011-10-02 MED ORDER — BUTAMBEN-TETRACAINE-BENZOCAINE 2-2-14 % EX AERO
INHALATION_SPRAY | CUTANEOUS | Status: DC | PRN
  Administered 2011-10-02: 2 via TOPICAL

## 2011-10-02 MED ORDER — MIDAZOLAM HCL 10 MG/2ML IJ SOLN
INTRAMUSCULAR | Status: AC
  Filled 2011-10-02: qty 4

## 2011-10-02 MED ORDER — POTASSIUM CHLORIDE IN NACL 20-0.9 MEQ/L-% IV SOLN
INTRAVENOUS | Status: DC
  Administered 2011-10-02 – 2011-10-03 (×4): via INTRAVENOUS
  Filled 2011-10-02 (×6): qty 1000

## 2011-10-02 MED ORDER — SODIUM CHLORIDE 0.9 % IV SOLN: Freq: Once | INTRAVENOUS | Status: DC

## 2011-10-02 MED ORDER — FENTANYL NICU IV SYRINGE 50 MCG/ML
INJECTION | INTRAMUSCULAR | Status: DC | PRN
  Administered 2011-10-02 (×3): 25 ug via INTRAVENOUS

## 2011-10-02 MED ORDER — PEG-KCL-NACL-NASULF-NA ASC-C 100 G PO SOLR
1.0000 | Freq: Once | ORAL | Status: AC
  Administered 2011-10-02: 100 g via ORAL
  Filled 2011-10-02 (×2): qty 1

## 2011-10-02 MED ORDER — BISACODYL 5 MG PO TBEC
10.0000 mg | DELAYED_RELEASE_TABLET | Freq: Once | ORAL | Status: AC
  Administered 2011-10-02: 10 mg via ORAL
  Filled 2011-10-02: qty 2

## 2011-10-02 MED ORDER — MIDAZOLAM HCL 10 MG/2ML IJ SOLN
INTRAMUSCULAR | Status: DC | PRN
  Administered 2011-10-02 (×3): 2 mg via INTRAVENOUS

## 2011-10-02 NOTE — Op Note (Signed)
SEE PENTAX REPORT. PLAN COLONOSCOPY TOMORROW

## 2011-10-02 NOTE — Progress Notes (Signed)
Pt smokes 1 ppd and is in contemplation stage. Discussed with pt the risk factors of smoking and its effects on his health. Pt voices understanding. He wants to think more about it however and is not ready to quit now. Advised and encouraged pt to quit. Referred to 1-800 quit now for f/u and support. Discussed oral fixation substitutes, second hand smoke and in home smoking policy. Reviewed and gave pt Written education/contact information.

## 2011-10-02 NOTE — Op Note (Signed)
[facility] [address] [place], [place]  [address]  ENDOSCOPY PROCEDURE REPORT  PATIENT:  James [name], James [name]  MR#:  [id number] BIRTHDATE:  [birth year], 55 yrs. old  GENDER:  male  ENDOSCOPIST:  Wilhemina Bonito. Eda Keys, MD Referred by:  [facility],  PROCEDURE DATE:  10/02/2011 PROCEDURE:  EGD, diagnostic 43235 ASA CLASS:  Class II INDICATIONS:  iron deficiency anemia, dysphagia, weight loss  MEDICATIONS:   Fentanyl 75 mcg IV, Versed 6 mg IV TOPICAL ANESTHETIC:  Cetacaine Spray  DESCRIPTION OF PROCEDURE:   After the risks benefits and alternatives of the procedure were thoroughly explained, informed consent was obtained.  The Pentax Gastroscope [id number] endoscope was introduced through the mouth and advanced to the second portion of the duodenum, without limitations.  The instrument was slowly withdrawn as the mucosa was fully examined. <<PROCEDUREIMAGES>>  Mild Esophagitis was found in the distal esophagus nd benign large caliber stricture.  Otherwise normal esophagus.  The stomach was entered and closely examined. The antrum, angularis, and lesser curvature were well visualized, including a retroflexed view of the cardia and fundus. The stomach wall was normally distensable. The scope passed easily through the pylorus into the duodenum. The duodenal bulb was normal in appearance, as was the postbulbar duodenum.    Retroflexed views revealed no abnormalities.    The scope was then withdrawn from the patient and the procedure completed.  COMPLICATIONS:  None  ENDOSCOPIC IMPRESSION: 1) Esophagitis in the distal esophagus 2) Stricture in the distal esophagus 3) Otherwise normal esophagus 4) Normal stomach 5) Normal duodenum  RECOMMENDATIONS: 1) Anti-reflux regimen to be followed 2) PROTONIX 40 MGDAILY 3) Plan to  schedule a colonoscopy for tomorrow  ______________________________ Wilhemina Bonito. Eda Keys, MD  CC:  The Patient.;   Hospitalists  n. eSIGNED:   Wilhemina Bonito. Eda Keys at 10/02/2011 12:58 PM  [place], [phone]

## 2011-10-02 NOTE — Progress Notes (Signed)
Utilization review completed. James [name] 10/02/2011 

## 2011-10-02 NOTE — Progress Notes (Addendum)
Subjective: Chart reviewed. Patient says he feels a whole lot better. He feels stronger and denies dyspnea, chest pain or dizziness. He says his last BM was 2-3 days ago and it was "black brown". Denies pain.  Objective: Blood pressure 103/55, pulse 74, temperature 98.1 F (36.7 C), temperature source Oral, resp. rate 19, height 5\' 9"  (1.753 m), weight 54.1 kg (119 lb 4.3 oz), SpO2 100.00%.  Intake/Output Summary (Last 24 hours) at 10/02/11 0812 Last data filed at 10/02/11 0400  Gross per 24 hour  Intake 2901.67 ml  Output   1100 ml  Net 1801.67 ml    General Exam: Comfortable. Looks older than stated age. Respiratory System: Clear. No increased work of breathing. Cardiovascular System: First and second heart sounds heard. Regular rate and rhythm. No JVD/murmurs. Telemetry shows sinus rhythm in the 70s to 80s. Gastrointestinal System: Abdomen is non distended, soft and normal bowel sounds heard. Central Nervous System: Alert and oriented. No focal neurological deficits.  Lab Results: Basic Metabolic Panel:  Basename 10/01/11 1008  NA 131*  K 3.1*  CL 92*  CO2 13*  GLUCOSE 123*  BUN 4*  CREATININE 0.46*  CALCIUM 8.3*  MG --  PHOS --   Liver Function Tests: No results found for this basename: AST:2,ALT:2,ALKPHOS:2,BILITOT:2,PROT:2,ALBUMIN:2 in the last 72 hours No results found for this basename: LIPASE:2,AMYLASE:2 in the last 72 hours No results found for this basename: AMMONIA:2 in the last 72 hours CBC:  Basename 10/02/11 0645 10/01/11 1008  WBC 19.4* 25.0*  NEUTROABS -- 20.9*  HGB 9.7* 3.5*  HCT 30.3* 14.8*  MCV 71.1* 58.3*  PLT 276 403*   Cardiac Enzymes: No results found for this basename: CKTOTAL:3,CKMB:3,CKMBINDEX:3,TROPONINI:3 in the last 72 hours BNP: No results found for this basename: PROBNP:3 in the last 72 hours D-Dimer:  Alvira Philips 10/01/11 0955  DDIMER 1.33*   CBG: No results found for this basename: GLUCAP:6 in the last 72 hours Hemoglobin  A1C: No results found for this basename: HGBA1C in the last 72 hours Fasting Lipid Panel: No results found for this basename: CHOL,HDL,LDLCALC,TRIG,CHOLHDL,LDLDIRECT in the last 72 hours Thyroid Function Tests: No results found for this basename: TSH,T4TOTAL,FREET4,T3FREE,THYROIDAB in the last 72 hours Anemia Panel: No results found for this basename: VITAMINB12,FOLATE,FERRITIN,TIBC,IRON,RETICCTPCT in the last 72 hours Coagulation:  Basename 10/02/11 0645  LABPROT 15.3*  INR 1.18   Urine Drug Screen: Drugs of Abuse     Component Value Date/Time   LABOPIA NONE DETECTED 10/01/2011 1712   COCAINSCRNUR NONE DETECTED 10/01/2011 1712   LABBENZ NONE DETECTED 10/01/2011 1712   AMPHETMU NONE DETECTED 10/01/2011 1712   THCU POSITIVE* 10/01/2011 1712   LABBARB NONE DETECTED 10/01/2011 1712    Alcohol Level: No results found for this basename: ETH:2 in the last 72 hours  Micro Results: Recent Results (from the past 240 hour(s))  MRSA PCR SCREENING     Status: Normal   Collection Time   10/01/11  1:15 PM      Component Value Range Status Comment   MRSA by PCR NEGATIVE  NEGATIVE  Final     Studies/Results: Dg Chest 2 View  10/01/2011  *RADIOLOGY REPORT*  Clinical Data: Shortness of breath, smoker.  CHEST - 2 VIEW  Comparison: None  Findings: Heart and mediastinal contours are within normal limits. No focal opacities or effusions.  No acute bony abnormality.  IMPRESSION: No active cardiopulmonary disease.  Original Report Authenticated By: Cyndie Chime, M.D.   Ct Angio Chest W/cm &/or Wo Cm  10/01/2011  *  RADIOLOGY REPORT*  Clinical Data: Shortness of breath.  Evaluate for pulmonary embolism.  CT ANGIOGRAPHY CHEST  Technique:  Multidetector CT imaging of the chest using the standard protocol during bolus administration of intravenous contrast. Multiplanar reconstructed images including MIPs were obtained and reviewed to evaluate the vascular anatomy.  Contrast: 90mL OMNIPAQUE IOHEXOL 300 MG/ML  IV SOLN  Comparison: Chest x-ray 10/01/2011.  Findings:  Mediastinum:  No filling defects are noted within the pulmonary arterial tree to suggest pulmonary emboli.  Heart size is normal. No pericardial fluid, thickening or calcification.  No acute abnormality of the thoracic aorta or other great vessels of the mediastinum.  There is atherosclerosis of the thoracic aorta, the great vessels of the mediastinum and the coronary arteries, including calcified atherosclerotic plaque in the left anterior descending left circumflex and right coronary arteries. No pathologically enlarged mediastinal or hilar lymph nodes. Mild circumferential thickening of the distal esophagus.  Lungs/Pleura:  No consolidative airspace disease.  No pleural effusions.  Images 59-61 of series 5 demonstrates a 7 mm nodular density in the left lower lobe.  This does not appear to be a parenchymal nodule, but rather appears to be associated with the bronchi extending to the posterobasal segment of the right lower lobe.  No additional suspicious appearing pulmonary nodules or masses are otherwise identified. However, there is a diffuse pattern of very fine centrilobular ground-glass attenuation micronodularity.  Upper Abdomen:  Visualized portions of the upper abdomen are unremarkable.  Musculoskeletal:  No aggressive appearing lytic or blastic lesions are noted in the visualized portions of the skeleton.  IMPRESSION:  1.  No evidence of pulmonary embolism. 2.  Pattern of a very fine centrilobular ground-glass attenuation micronodularity in the lungs bilaterally.  Appearance is suggestive of a smoking related disease such as RB (respiratory bronchiolitis), or if the patient is symptomatic, RB-ILD (respiratory bronchiolitis interstitial lung disease). 3. 7 mm nodular density in the right lower lobe, as above.  It is uncertain whether not this represents a parenchymal lung nodule, or, if this may represent an endobronchial lesion within the bronchi  extending to the posterior basal segment of the right lower lobe.  At this time, there are no postobstructive changes in the right lower lobe.  If the patient is at high risk for bronchogenic carcinoma, follow-up chest CT at 3-6 months is recommended.  If the patient is at low risk for bronchogenic carcinoma, follow-up chest CT at 6-12 months is recommended.  This recommendation follows the consensus statement: Guidelines for Management of Small Pulmonary Nodules Detected on CT Scans: A Statement from the Fleischner Society as published in Radiology 2005; 237:395-400. Online at: DietDisorder.cz. 4. Atherosclerosis, including three-vessel coronary artery disease. Please note that although the presence of coronary artery calcium documents the presence of coronary artery disease, the severity of this disease and any potential stenosis cannot be assessed on this non-gated CT examination.  Assessment for potential risk factor modification, dietary therapy or pharmacologic therapy may be warranted, if clinically indicated.  These results will be called to the ordering clinician or representative by the Radiologist Assistant, and communication documented in the PACS Dashboard.  Original Report Authenticated By: Florencia Reasons, M.D.    Medications: Scheduled Meds:    . sodium chloride   Intravenous Once  . sodium chloride   Intravenous STAT  . bisacodyl  10 mg Oral Q6H  . feeding supplement  237 mL Oral TID WC  . folic acid  1 mg Oral Daily  . mulitivitamin with  minerals  1 tablet Oral Daily  . nicotine  21 mg Transdermal Daily  . pantoprazole  40 mg Oral Q0600  . pantoprazole (PROTONIX) IV  40 mg Intravenous Once  . polyethylene glycol powder  1 Container Oral Once  . potassium chloride  10 mEq Intravenous Q1 Hr x 4  . thiamine  100 mg Oral Daily  . DISCONTD: pantoprazole (PROTONIX) IV  40 mg Intravenous Q24H   Continuous Infusions:  PRN Meds:.acetaminophen,  acetaminophen, albuterol, HYDROmorphone (DILAUDID) injection, iohexol, ondansetron (ZOFRAN) IV, ondansetron, DISCONTD: ondansetron (ZOFRAN) IV  Assessment/Plan: 1. Symptomatic severe microcytic anemia, possibly of iron deficiency secondary to slow/intermittent GI blood loss: Status post 4 units of packed red blood cell transfusion on 10/01/2011. Improved. GI workup is in progress. 2. Slow GI bleeding: GI input appreciated. Ruling out malignancy among other causes. Suspecting upper GI source. EGD today and if negative for colonoscopy tomorrow. Continue proton pump inhibitors. 3. Anion gap metabolic acidosis: Unclear etiology. Followup CMP that is pending. Normal saline hydration. Blood alcohol level, salicylate, lactate levels are pending. Creatinine and blood sugars look okay. 4. Hypokalemia: Follow up CMP and replete as needed. 5. Hyponatremia:? Secondary to dehydration versus SIADH. Continue normal saline hydration. Followup CMP, serum and urine osmolarity. 6. Tobacco abuse: Cessation counseling. Continue nicotine patch 7. Substance abuse/tetrahydrocannabinol: Substance abuse counseling 8. Abnormal CT scan of the chest: very fine centrilobular ground-glass attenuation micronodularity in the lungs bilaterally. ? Respiratory bronchiolitis interstitial lung disease. Patient asymptomatic. Smoking cessation advised. Outpatient followup. 9. Right lower lobe pulmonary nodule on CT scan of the chest in patient with history of tobacco abuse: Tobacco cessation. Followup CT scan of the chest in 3-6 months. Consider pulmonary consultation at some point. 10. Leukocytosis:? Secondary to iron deficiency anemia. Improved. No clinical focus of infection. Monitor 11. Alcohol use/?? Abuse: Continue multivitamins and check CIWA scores. Currently not on Ativan protocol. No features of withdrawal at this time.  Reviewed labs. Metabolic acidosis has resolved. Potassium and magnesium are normal. Serum lactate, acetaminophen  levels and blood alcohol levels negative. Mild hyponatremia.  Rubby Barbary 10/02/2011, 8:12 AM

## 2011-10-02 NOTE — ED Provider Notes (Signed)
Medical screening examination/treatment/procedure(s) were performed by non-physician practitioner and as supervising physician I was immediately available for consultation/collaboration. Tin Engram Y.   Lynzi Meulemans Y. Lummie Montijo, MD 10/02/11 0707 

## 2011-10-02 NOTE — Progress Notes (Signed)
Pt very sleepy, unable sustain awakeness for more than few mini.  Will Move all ext, grip equal and follows simple command with eye closed. unsafe for any po med at this time. High risk for aspiration. Will keep pt npo until fully awake. James [name]

## 2011-10-03 ENCOUNTER — Other Ambulatory Visit: Payer: Self-pay | Admitting: Internal Medicine

## 2011-10-03 ENCOUNTER — Encounter (HOSPITAL_COMMUNITY)
Admission: EM | Disposition: A | Discharge status: Home / Self Care | Payer: Self-pay | Source: Home / Self Care | Attending: Internal Medicine

## 2011-10-03 ENCOUNTER — Encounter (HOSPITAL_COMMUNITY): Payer: Self-pay | Admitting: Internal Medicine

## 2011-10-03 DIAGNOSIS — D126 Benign neoplasm of colon, unspecified: Secondary | ICD-10-CM | POA: Clinically undetermined

## 2011-10-03 DIAGNOSIS — R000 Tachycardia, unspecified: Secondary | ICD-10-CM

## 2011-10-03 HISTORY — PX: COLONOSCOPY: SHX5424

## 2011-10-03 LAB — UIFE/LIGHT CHAINS/TP QN, 24-HR UR
Alpha 1, Urine: DETECTED — AB
Alpha 2, Urine: DETECTED — AB
Free Kappa Lt Chains,Ur: 53.8 mg/dL — ABNORMAL HIGH (ref 0.14–2.42)
Free Kappa/Lambda Ratio: 8.72 ratio (ref 2.04–10.37)
Gamma Globulin, Urine: DETECTED — AB
Total Protein, Urine: 65.2 mg/dL

## 2011-10-03 LAB — CBC
HCT: 26.9 % — ABNORMAL LOW (ref 39.0–52.0)
Hemoglobin: 8.3 g/dL — ABNORMAL LOW (ref 13.0–17.0)
MCH: 22.2 pg — ABNORMAL LOW (ref 26.0–34.0)
MCHC: 30.9 g/dL (ref 30.0–36.0)
MCV: 71.9 fL — ABNORMAL LOW (ref 78.0–100.0)

## 2011-10-03 LAB — BASIC METABOLIC PANEL
BUN: 4 mg/dL — ABNORMAL LOW (ref 6–23)
Calcium: 7.5 mg/dL — ABNORMAL LOW (ref 8.4–10.5)
GFR calc non Af Amer: >90 mL/min (ref >90)
Glucose, Bld: 78 mg/dL (ref 70–99)

## 2011-10-03 SURGERY — COLONOSCOPY
Anesthesia: Moderate Sedation

## 2011-10-03 MED ORDER — POTASSIUM CHLORIDE CRYS ER 20 MEQ PO TBCR
EXTENDED_RELEASE_TABLET | ORAL | Status: AC
  Administered 2011-10-03: 40 meq via ORAL
  Filled 2011-10-03: qty 2

## 2011-10-03 MED ORDER — POTASSIUM CHLORIDE CRYS ER 20 MEQ PO TBCR
EXTENDED_RELEASE_TABLET | ORAL | Status: AC
  Filled 2011-10-03: qty 1

## 2011-10-03 MED ORDER — POTASSIUM CHLORIDE CRYS ER 20 MEQ PO TBCR
40.0000 meq | EXTENDED_RELEASE_TABLET | Freq: Once | ORAL | Status: AC
  Administered 2011-10-03: 40 meq via ORAL

## 2011-10-03 MED ORDER — MIDAZOLAM HCL 10 MG/2ML IJ SOLN
INTRAMUSCULAR | Status: AC
  Filled 2011-10-03: qty 2

## 2011-10-03 MED ORDER — POTASSIUM CHLORIDE CRYS ER 20 MEQ PO TBCR
20.0000 meq | EXTENDED_RELEASE_TABLET | Freq: Once | ORAL | Status: AC
  Administered 2011-10-03: 20 meq via ORAL
  Filled 2011-10-03: qty 1

## 2011-10-03 MED ORDER — MIDAZOLAM HCL 10 MG/2ML IJ SOLN
INTRAMUSCULAR | Status: DC | PRN
  Administered 2011-10-03: 1 mg via INTRAVENOUS
  Administered 2011-10-03 (×2): 2 mg via INTRAVENOUS
  Administered 2011-10-03: 1 mg via INTRAVENOUS
  Administered 2011-10-03: 2 mg via INTRAVENOUS

## 2011-10-03 MED ORDER — POTASSIUM CHLORIDE CRYS ER 20 MEQ PO TBCR
20.0000 meq | EXTENDED_RELEASE_TABLET | Freq: Once | ORAL | Status: AC
  Administered 2011-10-03: 20 meq via ORAL

## 2011-10-03 MED ORDER — FENTANYL CITRATE 0.05 MG/ML IJ SOLN
INTRAMUSCULAR | Status: AC
  Filled 2011-10-03: qty 2

## 2011-10-03 MED ORDER — FENTANYL CITRATE 0.05 MG/ML IJ SOLN
INTRAMUSCULAR | Status: DC | PRN
  Administered 2011-10-03 (×4): 25 ug via INTRAVENOUS

## 2011-10-03 NOTE — Progress Notes (Signed)
1 unit PRBCS given without problem.  Echo done at bedside.  Pt taken for colonoscopy at 1210hrs.

## 2011-10-03 NOTE — Progress Notes (Signed)
Subjective: Denies abdominal pain or nausea vomiting. Had bowel prep overnight for colonoscopy today. Watery brown stools. Denies blood or black-colored stools. At times feels weak on standing. Denies dizziness or lightheadedness. No chest pain, dyspnea or palpitations.  Objective: Blood pressure 127/56, pulse 84, temperature 97.6 F (36.4 C), temperature source Oral, resp. rate 23, height 5\' 9"  (1.753 m), weight 48.2 kg (106 lb 4.2 oz), SpO2 100.00%.  Intake/Output Summary (Last 24 hours) at 10/03/11 0757 Last data filed at 10/03/11 0700  Gross per 24 hour  Intake   1200 ml  Output    400 ml  Net    800 ml    General Exam: Comfortable. Looks older than stated age. Respiratory System: Clear. No increased work of breathing. Cardiovascular System: First and second heart sounds heard. Regular rate and rhythm. No JVD/murmurs. Telemetry shows sinus rhythm. One episode of 6 beat wide complex tachycardia? Nonsustained ventricular tachycardia. Gastrointestinal System: Abdomen is non distended, soft and normal bowel sounds heard. Central Nervous System: Alert and oriented. No focal neurological deficits.  Lab Results: Basic Metabolic Panel:  Basename 10/03/11 0410 10/02/11 0645  NA 135 131*  K 3.1* 3.7  CL 106 99  CO2 22 24  GLUCOSE 78 91  BUN 4* 5*  CREATININE 0.53 0.50  CALCIUM 7.5* 7.9*  MG -- 2.0  PHOS -- --   Liver Function Tests:  [facility] 10/02/11 0645  AST 33  ALT 17  ALKPHOS 262*  BILITOT 1.4*  PROT 6.1  ALBUMIN 2.1*   No results found for this basename: LIPASE:2,AMYLASE:2 in the last 72 hours No results found for this basename: AMMONIA:2 in the last 72 hours CBC:  Basename 10/03/11 0410 10/02/11 1704 10/01/11 1008  WBC 18.9* 19.2* --  NEUTROABS -- -- 20.9*  HGB 8.3* 9.2* --  HCT 26.9* 29.3* --  MCV 71.9* 71.8* --  PLT 241 245 --   Cardiac Enzymes: No results found for this basename: CKTOTAL:3,CKMB:3,CKMBINDEX:3,TROPONINI:3 in the last 72 hours BNP: No  results found for this basename: PROBNP:3 in the last 72 hours D-Dimer:  Alvira Philips 10/01/11 0955  DDIMER 1.33*   CBG: No results found for this basename: GLUCAP:6 in the last 72 hours Hemoglobin A1C: No results found for this basename: HGBA1C in the last 72 hours Fasting Lipid Panel: No results found for this basename: CHOL,HDL,LDLCALC,TRIG,CHOLHDL,LDLDIRECT in the last 72 hours Thyroid Function Tests:  [facility] 10/02/11 0645  TSH 4.302  T4TOTAL --  FREET4 --  T3FREE --  THYROIDAB --   Anemia Panel: No results found for this basename: VITAMINB12,FOLATE,FERRITIN,TIBC,IRON,RETICCTPCT in the last 72 hours Coagulation:  Basename 10/02/11 0645  LABPROT 15.3*  INR 1.18   Urine Drug Screen: Drugs of Abuse     Component Value Date/Time   LABOPIA NONE DETECTED 10/01/2011 1712   COCAINSCRNUR NONE DETECTED 10/01/2011 1712   LABBENZ NONE DETECTED 10/01/2011 1712   AMPHETMU NONE DETECTED 10/01/2011 1712   THCU POSITIVE* 10/01/2011 1712   LABBARB NONE DETECTED 10/01/2011 1712    Alcohol Level:  Basename 10/02/11 0645  ETH <11    Micro Results: Recent Results (from the past 240 hour(s))  MRSA PCR SCREENING     Status: Normal   Collection Time   10/01/11  1:15 PM      Component Value Range Status Comment   MRSA by PCR NEGATIVE  NEGATIVE  Final     Studies/Results: Dg Chest 2 View  10/01/2011  *RADIOLOGY REPORT*  Clinical Data: Shortness of breath, smoker.  CHEST - 2  VIEW  Comparison: None  Findings: Heart and mediastinal contours are within normal limits. No focal opacities or effusions.  No acute bony abnormality.  IMPRESSION: No active cardiopulmonary disease.  Original Report Authenticated By: Cyndie Chime, M.D.   Ct Angio Chest W/cm &/or Wo Cm  10/01/2011  *RADIOLOGY REPORT*  Clinical Data: Shortness of breath.  Evaluate for pulmonary embolism.  CT ANGIOGRAPHY CHEST  Technique:  Multidetector CT imaging of the chest using the standard protocol during bolus administration of  intravenous contrast. Multiplanar reconstructed images including MIPs were obtained and reviewed to evaluate the vascular anatomy.  Contrast: 90mL OMNIPAQUE IOHEXOL 300 MG/ML IV SOLN  Comparison: Chest x-ray 10/01/2011.  Findings:  Mediastinum:  No filling defects are noted within the pulmonary arterial tree to suggest pulmonary emboli.  Heart size is normal. No pericardial fluid, thickening or calcification.  No acute abnormality of the thoracic aorta or other great vessels of the mediastinum.  There is atherosclerosis of the thoracic aorta, the great vessels of the mediastinum and the coronary arteries, including calcified atherosclerotic plaque in the left anterior descending left circumflex and right coronary arteries. No pathologically enlarged mediastinal or hilar lymph nodes. Mild circumferential thickening of the distal esophagus.  Lungs/Pleura:  No consolidative airspace disease.  No pleural effusions.  Images 59-61 of series 5 demonstrates a 7 mm nodular density in the left lower lobe.  This does not appear to be a parenchymal nodule, but rather appears to be associated with the bronchi extending to the posterobasal segment of the right lower lobe.  No additional suspicious appearing pulmonary nodules or masses are otherwise identified. However, there is a diffuse pattern of very fine centrilobular ground-glass attenuation micronodularity.  Upper Abdomen:  Visualized portions of the upper abdomen are unremarkable.  Musculoskeletal:  No aggressive appearing lytic or blastic lesions are noted in the visualized portions of the skeleton.  IMPRESSION:  1.  No evidence of pulmonary embolism. 2.  Pattern of a very fine centrilobular ground-glass attenuation micronodularity in the lungs bilaterally.  Appearance is suggestive of a smoking related disease such as RB (respiratory bronchiolitis), or if the patient is symptomatic, RB-ILD (respiratory bronchiolitis interstitial lung disease). 3. 7 mm nodular density in  the right lower lobe, as above.  It is uncertain whether not this represents a parenchymal lung nodule, or, if this may represent an endobronchial lesion within the bronchi extending to the posterior basal segment of the right lower lobe.  At this time, there are no postobstructive changes in the right lower lobe.  If the patient is at high risk for bronchogenic carcinoma, follow-up chest CT at 3-6 months is recommended.  If the patient is at low risk for bronchogenic carcinoma, follow-up chest CT at 6-12 months is recommended.  This recommendation follows the consensus statement: Guidelines for Management of Small Pulmonary Nodules Detected on CT Scans: A Statement from the Fleischner Society as published in Radiology 2005; 237:395-400. Online at: DietDisorder.cz. 4. Atherosclerosis, including three-vessel coronary artery disease. Please note that although the presence of coronary artery calcium documents the presence of coronary artery disease, the severity of this disease and any potential stenosis cannot be assessed on this non-gated CT examination.  Assessment for potential risk factor modification, dietary therapy or pharmacologic therapy may be warranted, if clinically indicated.  These results will be called to the ordering clinician or representative by the Radiologist Assistant, and communication documented in the PACS Dashboard.  Original Report Authenticated By: Florencia Reasons, M.D.    Medications:  Scheduled Meds:    . bisacodyl  10 mg Oral Once  . feeding supplement  237 mL Oral TID WC  . folic acid  1 mg Oral Daily  . mulitivitamin with minerals  1 tablet Oral Daily  . nicotine  21 mg Transdermal Daily  . pantoprazole  40 mg Oral Q0600  . peg 3350 powder  1 kit Oral Once  . polyethylene glycol powder  1 Container Oral Once  . potassium chloride  20 mEq Oral Once  . thiamine  100 mg Oral Daily  . DISCONTD: sodium chloride   Intravenous Once  .  DISCONTD: bisacodyl  10 mg Oral Q6H   Continuous Infusions:    . 0.9 % NaCl with KCl 20 mEq / L 75 mL/hr at 10/02/11 2331   PRN Meds:.acetaminophen, acetaminophen, ondansetron (ZOFRAN) IV, ondansetron, DISCONTD: butamben-tetracaine-benzocaine, DISCONTD: fentaNYL, DISCONTD: midazolam  Assessment/Plan: 1. Symptomatic severe microcytic anemia, possibly of iron deficiency secondary to slow/intermittent GI blood loss: Status post 4 units of packed red blood cell transfusion on 10/01/2011 with appropriate response. Improved but still seems to be slightly symptomatic. Will transfuse another unit of packed red blood cells..  2. Slow GI bleeding: GI input appreciated. EGD showed distal esophagitis. Ruling out malignancy among other causes. For colonoscopy today. Continue proton pump inhibitors. 3. Hypokalemia: Replete and follow 4. Nonsustained wide complex tachycardia/NSVT: 2-D echocardiogram. Magnesium was 2 yesterday. Replete potassium and monitor on telemetry. TSH was normal. 5. Anion gap metabolic acidosis: Unclear etiology. Resolved. 6. Hyponatremia:? Secondary to dehydration versus SIADH. Resolved  7. Tobacco abuse: Cessation counseling. Continue nicotine patch 8. Substance abuse/tetrahydrocannabinol: Substance abuse counseling 9. Abnormal CT scan of the chest: very fine centrilobular ground-glass attenuation micronodularity in the lungs bilaterally. ? Respiratory bronchiolitis interstitial lung disease. Patient asymptomatic. Smoking cessation advised. Outpatient followup. 10. Right lower lobe pulmonary nodule on CT scan of the chest in patient with history of tobacco abuse: Tobacco cessation. Followup CT scan of the chest in 3-6 months. Consider pulmonary consultation at some point. 11. Leukocytosis:? Secondary to iron deficiency anemia. Improved. No clinical focus of infection. Monitor 12. Alcohol use/?? Abuse: Continue multivitamins and check CIWA scores. Currently not on Ativan protocol. No  features of withdrawal at this time.  Continue to monitor and manage in the step down unit for another day.   James [name] 10/03/2011, 7:57 AM

## 2011-10-03 NOTE — Op Note (Signed)
[facility] [address] [place], [place]  [address]  COLONOSCOPY PROCEDURE REPORT  PATIENT:  James [name], James [name]  MR#:  [id number] BIRTHDATE:  [birth year], 55 yrs. old  GENDER:  male ENDOSCOPIST:  Wilhemina Bonito. Eda Keys, MD REF. BY:  [facility], PROCEDURE DATE:  10/03/2011 PROCEDURE:  Colonoscopy with snare polypectomy X 3 ASA CLASS:  Class II INDICATIONS:  Iron deficiency anemia, weight loss, heme positive stool MEDICATIONS:   Fentanyl 100 mcg IV, Versed 8 mg IV  DESCRIPTION OF PROCEDURE:   After the risks benefits and alternatives of the procedure were thoroughly explained, informed consent was obtained.  Digital rectal exam was performed and revealed no abnormalities.   The EC-[id number] ([id number]) endoscope was introduced through the anus and advanced to the cecum, which was identified by both the appendix and ileocecal valve, without limitations.  The quality of the prep was excellent, using MoviPrep.  The instrument was then slowly withdrawn as the colon was fully examined. <<PROCEDUREIMAGES>>  FINDINGS:  Three polyps MEASURING 12-14mm were found in the mid transverse colon (2) and rectum. Polyps were snared, then cauterized with monopolar cautery. Retrieval was successful. Moderate diverticulosis was found found scattered throught the colon.   Retroflexed views in the rectum revealed internal hemorrhoids.    The time to cecum = 6  minutes. The scope was then withdrawn in  28  minutes from the cecum and the procedure completed.  COMPLICATIONS:  None ENDOSCOPIC IMPRESSION: 1) Three large polyps in the mid transverse colon and rectum - emoved 2) Moderate diverticulosis found scattered throught the colon 3) Internal hemorrhoids  RECOMMENDATIONS: 1) IRON REPLACEMENT AND MNITOR OF CBC BY PCP 1) Repeat Colonoscopy in 3 years if lesions benign.  ______________________________ Wilhemina Bonito. Eda Keys, MD  CC:  The Patient  n. eSIGNED:   Wilhemina Bonito. Eda Keys at  10/03/2011 02:26 PM  [place], [phone]

## 2011-10-03 NOTE — Progress Notes (Signed)
1524 hrs pt returned from colonoscopy.  VSS, Alert and oriented.

## 2011-10-03 NOTE — Progress Notes (Signed)
2D Echo completed.  Jolana Runkles Johnson, RDCS 

## 2011-10-03 NOTE — Progress Notes (Signed)
Completed prep.  Stools clear.  Ready for colonoscopy.   Did not re-eaxamine.  VSS H & H stable  Esophagitis and distal esophageal stricture on EGD yesterday.   On Protonix daily. He will need generic PPI at d/c as he has limited funds.   For colonoscopy to complete workup of profound microcytic anemia.    Jennye Moccasin  PA-C  Seen and agree.The nature of the procedure, as well as the risks, benefits, and alternatives were carefully and thoroughly reviewed with the patient. Ample time for discussion and questions allowed. The patient understood, was satisfied, and agreed to proceed.   Wilhemina Bonito. Eda Keys., M.D. [facility] Division of Gastroenterology

## 2011-10-04 LAB — CBC
HCT: 29.6 % — ABNORMAL LOW (ref 39.0–52.0)
Hemoglobin: 9.3 g/dL — ABNORMAL LOW (ref 13.0–17.0)
MCH: 23.8 pg — ABNORMAL LOW (ref 26.0–34.0)
MCH: 23.6 pg — ABNORMAL LOW (ref 26.0–34.0)
MCHC: 31.4 g/dL (ref 30.0–36.0)
MCV: 75.7 fL — ABNORMAL LOW (ref 78.0–100.0)
Platelets: 247 10*3/uL (ref 150–400)
RDW: 28.2 % — ABNORMAL HIGH (ref 11.5–15.5)
RDW: 28.3 % — ABNORMAL HIGH (ref 11.5–15.5)

## 2011-10-04 LAB — BASIC METABOLIC PANEL
BUN: 2 mg/dL — ABNORMAL LOW (ref 6–23)
Calcium: 8 mg/dL — ABNORMAL LOW (ref 8.4–10.5)
GFR calc Af Amer: >90 mL/min (ref >90)
GFR calc non Af Amer: >90 mL/min (ref >90)
Glucose, Bld: 101 mg/dL — ABNORMAL HIGH (ref 70–99)
Potassium: 4.1 mEq/L (ref 3.5–5.1)
Sodium: 135 mEq/L (ref 135–145)

## 2011-10-04 LAB — TYPE AND SCREEN
ABO/RH(D): O POS
Unit division: 0
Unit division: 0

## 2011-10-04 MED ORDER — FERROUS SULFATE 325 (65 FE) MG PO TABS
325.0000 mg | ORAL_TABLET | Freq: Two times a day (BID) | ORAL | Status: DC
  Administered 2011-10-04: 325 mg via ORAL
  Filled 2011-10-04 (×2): qty 1

## 2011-10-04 NOTE — Progress Notes (Addendum)
Subjective: Feels slightly unsteady with ambulation. Denies any other complaints. Tolerating regular diet.  Objective: Blood pressure 112/56, pulse 76, temperature 98 F (36.7 C), temperature source Oral, resp. rate 18, height 5\' 9"  (1.753 m), weight 55 kg (121 lb 4.1 oz), SpO2 99.00%.  Intake/Output Summary (Last 24 hours) at 10/04/11 0950 Last data filed at 10/04/11 0900  Gross per 24 hour  Intake   3120 ml  Output      0 ml  Net   3120 ml    General Exam: Comfortable. Looks older than stated age. Respiratory System: Clear. No increased work of breathing. Cardiovascular System: First and second heart sounds heard. Regular rate and rhythm. No JVD/murmurs. Telemetry shows sinus rhythm. No further episodes of wide complex tachycardia. Gastrointestinal System: Abdomen is non distended, soft and normal bowel sounds heard. Central Nervous System: Alert and oriented. No focal neurological deficits.  Lab Results: Basic Metabolic Panel:  Basename 10/03/11 0410 10/02/11 0645  NA 135 131*  K 3.1* 3.7  CL 106 99  CO2 22 24  GLUCOSE 78 91  BUN 4* 5*  CREATININE 0.53 0.50  CALCIUM 7.5* 7.9*  MG -- 2.0  PHOS -- --   Liver Function Tests:  [facility] 10/02/11 0645  AST 33  ALT 17  ALKPHOS 262*  BILITOT 1.4*  PROT 6.1  ALBUMIN 2.1*   No results found for this basename: LIPASE:2,AMYLASE:2 in the last 72 hours No results found for this basename: AMMONIA:2 in the last 72 hours CBC:  Basename 10/04/11 0730 10/03/11 0410 10/01/11 1008  WBC 15.1* 18.9* --  NEUTROABS -- -- 20.9*  HGB 9.3* 8.3* --  HCT 29.6* 26.9* --  MCV 75.7* 71.9* --  PLT 226 241 --   Cardiac Enzymes: No results found for this basename: CKTOTAL:3,CKMB:3,CKMBINDEX:3,TROPONINI:3 in the last 72 hours BNP: No results found for this basename: PROBNP:3 in the last 72 hours D-Dimer:  Alvira Philips 10/01/11 0955  DDIMER 1.33*   CBG: No results found for this basename: GLUCAP:6 in the last 72 hours Hemoglobin  A1C: No results found for this basename: HGBA1C in the last 72 hours Fasting Lipid Panel: No results found for this basename: CHOL,HDL,LDLCALC,TRIG,CHOLHDL,LDLDIRECT in the last 72 hours Thyroid Function Tests:  [facility] 10/02/11 0645  TSH 4.302  T4TOTAL --  FREET4 --  T3FREE --  THYROIDAB --   Anemia Panel: No results found for this basename: VITAMINB12,FOLATE,FERRITIN,TIBC,IRON,RETICCTPCT in the last 72 hours Coagulation:  Basename 10/02/11 0645  LABPROT 15.3*  INR 1.18   Urine Drug Screen: Drugs of Abuse     Component Value Date/Time   LABOPIA NONE DETECTED 10/01/2011 1712   COCAINSCRNUR NONE DETECTED 10/01/2011 1712   LABBENZ NONE DETECTED 10/01/2011 1712   AMPHETMU NONE DETECTED 10/01/2011 1712   THCU POSITIVE* 10/01/2011 1712   LABBARB NONE DETECTED 10/01/2011 1712    Alcohol Level:  Basename 10/02/11 0645  ETH <11    Micro Results: Recent Results (from the past 240 hour(s))  MRSA PCR SCREENING     Status: Normal   Collection Time   10/01/11  1:15 PM      Component Value Range Status Comment   MRSA by PCR NEGATIVE  NEGATIVE  Final     Studies/Results: Dg Chest 2 View  10/01/2011  *RADIOLOGY REPORT*  Clinical Data: Shortness of breath, smoker.  CHEST - 2 VIEW  Comparison: None  Findings: Heart and mediastinal contours are within normal limits. No focal opacities or effusions.  No acute bony abnormality.  IMPRESSION: No active  cardiopulmonary disease.  Original Report Authenticated By: Cyndie Chime, M.D.   Ct Angio Chest W/cm &/or Wo Cm  10/01/2011  *RADIOLOGY REPORT*  Clinical Data: Shortness of breath.  Evaluate for pulmonary embolism.  CT ANGIOGRAPHY CHEST  Technique:  Multidetector CT imaging of the chest using the standard protocol during bolus administration of intravenous contrast. Multiplanar reconstructed images including MIPs were obtained and reviewed to evaluate the vascular anatomy.  Contrast: 90mL OMNIPAQUE IOHEXOL 300 MG/ML IV SOLN  Comparison: Chest  x-ray 10/01/2011.  Findings:  Mediastinum:  No filling defects are noted within the pulmonary arterial tree to suggest pulmonary emboli.  Heart size is normal. No pericardial fluid, thickening or calcification.  No acute abnormality of the thoracic aorta or other great vessels of the mediastinum.  There is atherosclerosis of the thoracic aorta, the great vessels of the mediastinum and the coronary arteries, including calcified atherosclerotic plaque in the left anterior descending left circumflex and right coronary arteries. No pathologically enlarged mediastinal or hilar lymph nodes. Mild circumferential thickening of the distal esophagus.  Lungs/Pleura:  No consolidative airspace disease.  No pleural effusions.  Images 59-61 of series 5 demonstrates a 7 mm nodular density in the left lower lobe.  This does not appear to be a parenchymal nodule, but rather appears to be associated with the bronchi extending to the posterobasal segment of the right lower lobe.  No additional suspicious appearing pulmonary nodules or masses are otherwise identified. However, there is a diffuse pattern of very fine centrilobular ground-glass attenuation micronodularity.  Upper Abdomen:  Visualized portions of the upper abdomen are unremarkable.  Musculoskeletal:  No aggressive appearing lytic or blastic lesions are noted in the visualized portions of the skeleton.  IMPRESSION:  1.  No evidence of pulmonary embolism. 2.  Pattern of a very fine centrilobular ground-glass attenuation micronodularity in the lungs bilaterally.  Appearance is suggestive of a smoking related disease such as RB (respiratory bronchiolitis), or if the patient is symptomatic, RB-ILD (respiratory bronchiolitis interstitial lung disease). 3. 7 mm nodular density in the right lower lobe, as above.  It is uncertain whether not this represents a parenchymal lung nodule, or, if this may represent an endobronchial lesion within the bronchi extending to the posterior  basal segment of the right lower lobe.  At this time, there are no postobstructive changes in the right lower lobe.  If the patient is at high risk for bronchogenic carcinoma, follow-up chest CT at 3-6 months is recommended.  If the patient is at low risk for bronchogenic carcinoma, follow-up chest CT at 6-12 months is recommended.  This recommendation follows the consensus statement: Guidelines for Management of Small Pulmonary Nodules Detected on CT Scans: A Statement from the Fleischner Society as published in Radiology 2005; 237:395-400. Online at: DietDisorder.cz. 4. Atherosclerosis, including three-vessel coronary artery disease. Please note that although the presence of coronary artery calcium documents the presence of coronary artery disease, the severity of this disease and any potential stenosis cannot be assessed on this non-gated CT examination.  Assessment for potential risk factor modification, dietary therapy or pharmacologic therapy may be warranted, if clinically indicated.  These results will be called to the ordering clinician or representative by the Radiologist Assistant, and communication documented in the PACS Dashboard.  Original Report Authenticated By: Florencia Reasons, M.D.    Medications: Scheduled Meds:    . feeding supplement  237 mL Oral TID WC  . ferrous sulfate  325 mg Oral BID WC  . folic acid  1 mg Oral Daily  . mulitivitamin with minerals  1 tablet Oral Daily  . nicotine  21 mg Transdermal Daily  . pantoprazole  40 mg Oral Q0600  . potassium chloride  40 mEq Oral Once  . thiamine  100 mg Oral Daily  . DISCONTD: polyethylene glycol powder  1 Container Oral Once   Continuous Infusions:    . DISCONTD: 0.9 % NaCl with KCl 20 mEq / L 75 mL/hr at 10/03/11 2127   PRN Meds:.acetaminophen, acetaminophen, ondansetron (ZOFRAN) IV, ondansetron, DISCONTD: fentaNYL, DISCONTD: midazolam  Assessment/Plan: 1. Symptomatic severe  microcytic anemia, possibly of iron deficiency secondary to slow/intermittent GI blood loss: Status post 5 units of packed red blood cell transfusion since admission: Improved. Unfortunately anemia panel was not sent prior to transfusions and hence will not be helpful to draw at this time. Will start on oral ferrous sulfate. 2. Slow GI bleeding: GI input appreciated. EGD showed distal esophagitis. Colonoscopy showed 3 large polyps in the mid transverse colon and rectum which were removed and moderate diverticulosis. Continue proton pump inhibitors. 3. Hypokalemia: BMP sample this morning was apparently contaminated. Will repeat and follow. 4. Nonsustained wide complex tachycardia/NSVT on the night of 10/02/2011: 2-D echocardiogram shows left ventricle ejection fraction of 45-50% and no wall motion abnormalities. Will discuss with cardiology regarding need for further evaluation. Followup BMP and magnesium this morning. TSH was normal. EKG on 23rd of January 2013 showed sinus rhythm with no acute changes and QTC of 457 ms. 5. Anion gap metabolic acidosis: Unclear etiology. Resolved. 6. Hyponatremia:? Secondary to dehydration versus SIADH. Resolved  7. Tobacco abuse: Cessation counseling. Continue nicotine patch 8. Substance abuse/tetrahydrocannabinol: Substance abuse counseling 9. Abnormal CT scan of the chest: very fine centrilobular ground-glass attenuation micronodularity in the lungs bilaterally. ? Respiratory bronchiolitis interstitial lung disease. Patient asymptomatic. Smoking cessation advised. Outpatient followup. 10. Right lower lobe pulmonary nodule on CT scan of the chest in patient with history of tobacco abuse: Tobacco cessation. Followup CT scan of the chest in 3-6 months. Consider pulmonary consultation at some point. 11. Leukocytosis:? Secondary to iron deficiency anemia. Improved. No clinical focus of infection. Monitor 12. Alcohol use/?? Abuse: Continue multivitamins and check CIWA  scores. Currently not on Ativan protocol. No features of withdrawal at this time.  We'll transfer patient to telemetry. Should be able to discharge home in approximately 24 hours.  Addendum: Discussed with [place] cardiologist on call who will assist with and arrange for outpatient followup with outpatient stress testing  Discussed at length over the phone with patient's spouse Ms. Magda Paganini, at the patient's request, updated care and answered all questions.   James [name] 10/04/2011, 9:50 AM

## 2011-10-04 NOTE — Progress Notes (Signed)
Patient ID: James [name], male   DOB: 01-11-1956, 56 y.o.   MRN: [id number] The patient was admitted with severe anemia. He is being treated well and improving. Telemetry revealed 6 beats of ventricular tachycardia with a rate of approximately 150. The patient was asymptomatic. Two-dimensional echo had been obtained showing an ejection fraction of 45-50% with no focal wall motion abnormalities. Cardiology was called for further advice by telephone. It was decided that we would schedule the patient to be seen for cardiology consultation as an outpatient. Most probably at that time it would be appropriate to proceed with exercise testing. It may also be appropriate to proceed with repeat two-dimensional echo. It is possible that the patient has had high output failure with some decrease in LV function related to the work load of a hemoglobin of 3.2. We will be in contact with the patient and make the arrangements.

## 2011-10-05 DIAGNOSIS — D72829 Elevated white blood cell count, unspecified: Secondary | ICD-10-CM

## 2011-10-05 LAB — CBC
HCT: 34.7 % — ABNORMAL LOW (ref 39.0–52.0)
Hemoglobin: 10.9 g/dL — ABNORMAL LOW (ref 13.0–17.0)
MCH: 23.4 pg — ABNORMAL LOW (ref 26.0–34.0)
MCHC: 31.4 g/dL (ref 30.0–36.0)
MCV: 74.5 fL — ABNORMAL LOW (ref 78.0–100.0)
Platelets: 285 K/uL (ref 150–400)
RBC: 4.66 MIL/uL (ref 4.22–5.81)
RDW: 29.6 % — ABNORMAL HIGH (ref 11.5–15.5)
WBC: 22.2 K/uL — ABNORMAL HIGH (ref 4.0–10.5)

## 2011-10-05 LAB — BASIC METABOLIC PANEL WITH GFR
BUN: 5 mg/dL — ABNORMAL LOW (ref 6–23)
CO2: 24 meq/L (ref 19–32)
Calcium: 8.6 mg/dL (ref 8.4–10.5)
Chloride: 106 meq/L (ref 96–112)
Creatinine, Ser: 0.55 mg/dL (ref 0.50–1.35)
GFR calc Af Amer: >90 mL/min
GFR calc non Af Amer: >90 mL/min
Glucose, Bld: 94 mg/dL (ref 70–99)
Potassium: 4.9 meq/L (ref 3.5–5.1)
Sodium: 134 meq/L — ABNORMAL LOW (ref 135–145)

## 2011-10-05 MED ORDER — FERROUS SULFATE 325 (65 FE) MG PO TABS
325.0000 mg | ORAL_TABLET | Freq: Two times a day (BID) | ORAL | Status: DC
  Administered 2011-10-05 – 2011-10-06 (×3): 325 mg via ORAL
  Filled 2011-10-05 (×5): qty 1

## 2011-10-05 NOTE — Consult Note (Signed)
Reason for Referral: Anemia and Leukocytosis.    Chief Complaint  Patient presents with  . Shortness of Breath    sob and general weakness on and off for "a while' but worse this morning. denies any medical history.    HPI: 56 year old man, native of [place] and currently lives with his brother. He is a pleasant gentleman who have not seen a doctor for few years and not know to have a significant past medical history. He presented to the ED on 1/23 with chief complaint of generalized weakness and dyspnea with exertion. He denies any cough, nasal congestion, fever or chills. He stated that he noticed dark stool for about a month. But denies any red blood per rectum. He also denies any hematemesis or abdominal pain. Patient stated that he had a colonoscopy about 5 years ago and was told was unremarkable. He noticed about 30 pounds weight loss over the last couple of years. He was last seen by a physician about 3 years ago. In the ED patient was found to be anemic with a hemoglobin of 3.5, and sever microcytosis with MCV of 58.  His stool was positive for occult blood.   He under went a colonoscopy and endoscopy after transfusion of PRBC.   Colonoscopy showed polyps but no cancer, and the endoscopy showed:   Esophagitis in the distal esophagus  2) Stricture in the distal esophagus  3) Otherwise normal esophagus  4) Normal stomach  5) Normal duodenum.  It was also noted that he has elevated WBC ranging  From 25 K (1/23) down to 15 K on 1/26. And today is up to 22 K. The diff showed predominantly left shift and neutrophilia.   I was asked to comment on his leucocytosis to rule out a hematological disorder.  Clinically, patient is improving at this time. And H/H are stable after transfusion.    Past Medical History  Diagnosis Date  . No pertinent past medical history   . Anemia   . GERD (gastroesophageal reflux disease)   . Dysphagia   :  Past Surgical History  Procedure  Date  . Esophagogastroduodenoscopy 10/02/2011    Procedure: ESOPHAGOGASTRODUODENOSCOPY (EGD);  Surgeon: Yancey Flemings, MD;  Location: [facility] ENDOSCOPY;  Service: Endoscopy;  Laterality: N/A;  :  Current facility-administered medications:acetaminophen (TYLENOL) suppository 650 mg, 650 mg, Rectal, Q6H PRN, Baltazar Najjar, MD;  acetaminophen (TYLENOL) tablet 650 mg, 650 mg, Oral, Q6H PRN, Baltazar Najjar, MD, 325 mg at 10/01/11 2346;  feeding supplement (ENSURE CLINICAL STRENGTH) liquid 237 mL, 237 mL, Oral, TID WC, Jeoffrey Massed, RD, 237 mL at 10/05/11 9811 ferrous sulfate tablet 325 mg, 325 mg, Oral, BID WC, Marcellus Scott, MD, 325 mg at 10/05/11 9147;  folic acid (FOLVITE) tablet 1 mg, 1 mg, Oral, Daily, Baltazar Najjar, MD, 1 mg at 10/04/11 1006;  mulitivitamin with minerals tablet 1 tablet, 1 tablet, Oral, Daily, Baltazar Najjar, MD, 1 tablet at 10/04/11 1006;  nicotine (NICODERM CQ - dosed in mg/24 hours) patch 21 mg, 21 mg, Transdermal, Daily, Baltazar Najjar, MD, 21 mg at 10/04/11 1006 ondansetron (ZOFRAN) injection 4 mg, 4 mg, Intravenous, Q6H PRN, Baltazar Najjar, MD;  ondansetron (ZOFRAN) tablet 4 mg, 4 mg, Oral, Q6H PRN, Baltazar Najjar, MD;  pantoprazole (PROTONIX) EC tablet 40 mg, 40 mg, Oral, Q0600, Dianah Field, PA, 40 mg at 10/05/11 8295;  thiamine (VITAMIN B-1) tablet 100 mg, 100 mg, Oral, Daily, Baltazar Najjar, MD, 100 mg at 10/04/11 1006 DISCONTD: 0.9 %  NaCl with KCl 20 mEq/ L  infusion, , Intravenous, Continuous, Marcellus Scott, MD, Last Rate: 75 mL/hr at 10/03/11 2127;  DISCONTD: ferrous sulfate tablet 325 mg, 325 mg, Oral, BID WC, Marcellus Scott, MD, 325 mg at 10/04/11 1638:     . feeding supplement  237 mL Oral TID WC  . ferrous sulfate  325 mg Oral BID WC  . folic acid  1 mg Oral Daily  . mulitivitamin with minerals  1 tablet Oral Daily  . nicotine  21 mg Transdermal Daily  . pantoprazole  40 mg Oral Q0600  . thiamine  100 mg Oral Daily  . DISCONTD: ferrous sulfate  325 mg Oral BID WC    :  No Known Allergies:  History reviewed. No pertinent family history.:  History   Social History  . Marital Status: Married    Spouse Name: N/A    Number of Children: N/A  . Years of Education: N/A   Occupational History  . Not on file.   Social History Main Topics  . Smoking status: Current Everyday Smoker -- 1.0 packs/day for 20 years    Types: Cigarettes  . Smokeless tobacco: Never Used  . Alcohol Use: 1.2 oz/week    2 Cans of beer per week  . Drug Use: No  . Sexually Active: Not Currently   Other Topics Concern  . Not on file   Social History Narrative  . No narrative on file  :  Constitutional: negative Eyes: negative Ears, nose, mouth, throat, and face: negative Respiratory: negative Cardiovascular: negative Gastrointestinal: negative Genitourinary:negative Hematologic/lymphatic: negative Musculoskeletal:negative Neurological: negative  Exam: Patient Vitals for the past 24 hrs:  BP Temp Temp src Pulse Resp SpO2 Height Weight  10/05/11 0830 110/64 mmHg - - 70  - - - -  10/05/11 0550 107/64 mmHg 98.4 F (36.9 C) Oral 71  24  99 % - -  10/04/11 2345 102/61 mmHg - - 72  - - - -  10/04/11 2109 110/62 mmHg 98.4 F (36.9 C) - 73  18  99 % - -  10/04/11 1455 113/67 mmHg 98.3 F (36.8 C) - 76  19  97 % 5\' 10"  (1.778 m) 114 lb 6.7 oz (51.9 kg)  10/04/11 1200 115/56 mmHg 98.2 F (36.8 C) Oral 68  18  100 % - -   General appearance: alert, cooperative and appears older than stated age Head: Normocephalic, without obvious abnormality, atraumatic Nose: Nares normal. Septum midline. Mucosa normal. No drainage or sinus tenderness. Throat: lips, mucosa, and tongue normal; teeth and gums normal Neck: no adenopathy, no carotid bruit, no JVD, supple, symmetrical, trachea midline and thyroid not enlarged, symmetric, no tenderness/mass/nodules Resp: clear to auscultation bilaterally Chest wall: no tenderness Cardio: regular rate and rhythm, S1, S2 normal, no  murmur, click, rub or gallop GI: soft, non-tender; bowel sounds normal; no masses,  no organomegaly Extremities: extremities normal, atraumatic, no cyanosis or edema Skin: Skin color, texture, turgor normal. No rashes or lesions   Basename 10/05/11 0615 10/04/11 0943  WBC 22.2* 18.0*  HGB 10.9* 10.8*  HCT 34.7* 34.6*  PLT 285 247    Basename 10/05/11 0615 10/04/11 0943  NA 134* 135  K 4.9 4.1  CL 106 108  CO2 24 18*  GLUCOSE 94 101*  BUN 5* 2*  CREATININE 0.55 0.40*  CALCIUM 8.6 8.0*   Lab Results  Component Value Date   WBC 22.2* 10/05/2011   HGB 10.9* 10/05/2011   HCT 34.7* 10/05/2011  MCV 74.5* 10/05/2011   PLT 285 10/05/2011    Lab 10/05/11 0615 10/02/11 0645  NA 134* --  K 4.9 --  CL 106 --  CO2 24 --  BUN 5* --  CREATININE 0.55 --  CALCIUM 8.6 --  PROT -- 6.1  BILITOT -- 1.4*  ALKPHOS -- 262*  ALT -- 17  AST -- 33  GLUCOSE 94 --     Blood smear review: I personally reviewed the smear today which showed: RBCs are microcytic and hypochromic. Polychromasia noted.  No spherocytosis WBC: no immature cells noted. Hypo lobated neutrophils noted.   Platelet: increase in number and size.    Dg Chest 2 View  10/01/2011  *RADIOLOGY REPORT*  Clinical Data: Shortness of breath, smoker.  CHEST - 2 VIEW  Comparison: None  Findings: Heart and mediastinal contours are within normal limits. No focal opacities or effusions.  No acute bony abnormality.  IMPRESSION: No active cardiopulmonary disease.  Original Report Authenticated By: Cyndie Chime, M.D.   Ct Angio Chest W/cm &/or Wo Cm  10/01/2011  *RADIOLOGY REPORT*  Clinical Data: Shortness of breath.  Evaluate for pulmonary embolism.  CT ANGIOGRAPHY CHEST  Technique:  Multidetector CT imaging of the chest using the standard protocol during bolus administration of intravenous contrast. Multiplanar reconstructed images including MIPs were obtained and reviewed to evaluate the vascular anatomy.  Contrast: 90mL OMNIPAQUE  IOHEXOL 300 MG/ML IV SOLN  Comparison: Chest x-ray 10/01/2011.  Findings:  Mediastinum:  No filling defects are noted within the pulmonary arterial tree to suggest pulmonary emboli.  Heart size is normal. No pericardial fluid, thickening or calcification.  No acute abnormality of the thoracic aorta or other great vessels of the mediastinum.  There is atherosclerosis of the thoracic aorta, the great vessels of the mediastinum and the coronary arteries, including calcified atherosclerotic plaque in the left anterior descending left circumflex and right coronary arteries. No pathologically enlarged mediastinal or hilar lymph nodes. Mild circumferential thickening of the distal esophagus.  Lungs/Pleura:  No consolidative airspace disease.  No pleural effusions.  Images 59-61 of series 5 demonstrates a 7 mm nodular density in the left lower lobe.  This does not appear to be a parenchymal nodule, but rather appears to be associated with the bronchi extending to the posterobasal segment of the right lower lobe.  No additional suspicious appearing pulmonary nodules or masses are otherwise identified. However, there is a diffuse pattern of very fine centrilobular ground-glass attenuation micronodularity.  Upper Abdomen:  Visualized portions of the upper abdomen are unremarkable.  Musculoskeletal:  No aggressive appearing lytic or blastic lesions are noted in the visualized portions of the skeleton.  IMPRESSION:  1.  No evidence of pulmonary embolism. 2.  Pattern of a very fine centrilobular ground-glass attenuation micronodularity in the lungs bilaterally.  Appearance is suggestive of a smoking related disease such as RB (respiratory bronchiolitis), or if the patient is symptomatic, RB-ILD (respiratory bronchiolitis interstitial lung disease). 3. 7 mm nodular density in the right lower lobe, as above.  It is uncertain whether not this represents a parenchymal lung nodule, or, if this may represent an endobronchial lesion  within the bronchi extending to the posterior basal segment of the right lower lobe.  At this time, there are no postobstructive changes in the right lower lobe.  If the patient is at high risk for bronchogenic carcinoma, follow-up chest CT at 3-6 months is recommended.  If the patient is at low risk for bronchogenic carcinoma, follow-up chest CT  at 6-12 months is recommended.  This recommendation follows the consensus statement: Guidelines for Management of Small Pulmonary Nodules Detected on CT Scans: A Statement from the Fleischner Society as published in Radiology 2005; 237:395-400. Online at: DietDisorder.cz. 4. Atherosclerosis, including three-vessel coronary artery disease. Please note that although the presence of coronary artery calcium documents the presence of coronary artery disease, the severity of this disease and any potential stenosis cannot be assessed on this non-gated CT examination.  Assessment for potential risk factor modification, dietary therapy or pharmacologic therapy may be warranted, if clinically indicated.  These results will be called to the ordering clinician or representative by the Radiologist Assistant, and communication documented in the PACS Dashboard.  Original Report Authenticated By: Florencia Reasons, M.D.    Assessment and Plan:   56 year old with the following issues:  1. Microcytic, hypochromic severe anemia on presentation. GI work up showed colon polyps and esophagitis. 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  Other etiologies such as MDS/MPD (myelodysplastic syndrome/ myeloproliferative disorder) are also a consideration. This could have combined with his Fe loss to make his anemia more profound. I doubt there was any hemolysis involved.  I agree with Oral Fe replacement at this time.   2. Leukocytosis with a left shift. This most likely represent a reactive process to his  acute illness and severe anemia. However, MDS/MPD are also a consideration. His smear has no features to suggest an acute hematological process at this point.  I would recommend a hematological follow up as a out patient in next 3-4 weeks which include repeat his CBC and smear and see if WBC comes down in the steady state. If not, further hematological work up will be recommended at that time (JAK-2 mutation, bone marrow biopsy, etc..)  I will arrange for him to follow up at [facility] to repeat his CBC and smear in 3-4 weeks.  Thank you for the consult.  Please call with questions.

## 2011-10-05 NOTE — Evaluation (Signed)
Physical Therapy Evaluation Patient Details Name: James [name] MRN: [id number] DOB: 1956/04/11 Today's Date: 10/05/2011  Problem List:  Patient Active Problem List  Diagnoses  . Anemia due to GI blood loss  . Weakness generalized  . Hypokalemia  . Hyponatremia  . Dyspnea  . Tobacco abuse  . Alcohol abuse  . Leukocytosis  . Dysphagia, unspecified  . Iron deficiency anemia, unspecified  . Weight loss, abnormal  . Nonspecific abnormal finding in stool contents  . Reflux esophagitis  . Benign neoplasm of colon    Past Medical History:  Past Medical History  Diagnosis Date  . No pertinent past medical history   . Anemia   . GERD (gastroesophageal reflux disease)   . Dysphagia    Past Surgical History:  Past Surgical History  Procedure Date  . Esophagogastroduodenoscopy 10/02/2011    Procedure: ESOPHAGOGASTRODUODENOSCOPY (EGD);  Surgeon: Yancey Flemings, MD;  Location: [facility] ENDOSCOPY;  Service: Endoscopy;  Laterality: N/A;    PT Assessment/Plan/Recommendation PT Assessment Clinical Impression Statement: Patient is a 56 yo male admitted with anemia due to GI blood loss.  Patient with general weakness impacting functional mobility.  Minimal balance deficit (scored 21/24 on DGI).  Provided patient with exercises and encouraged ambulation for strengthening.  Anticipate patient will progress well with this plan. No other acute or follow-up PT needs identified.   PT will sign off. PT Recommendation/Assessment: Patent does not need any further PT services No Skilled PT: All education completed;Patient is independent with all acitivity/mobility PT Recommendation Follow Up Recommendations: No PT follow up Equipment Recommended: None recommended by PT PT Goals     PT Evaluation Precautions/Restrictions  Precautions Precaution Comments: None Required Braces or Orthoses: No Restrictions Weight Bearing Restrictions: No Prior Functioning  Home Living Lives With:  Spouse;Son;Daughter Receives Help From: Family Type of Home: House Home Layout: One level Home Access: Stairs to enter Entrance Stairs-Rails: None Entrance Stairs-Number of Steps: 3 Bathroom Shower/Tub: Health visitor: Standard Home Adaptive Equipment: None Prior Function Level of Independence: Independent with basic ADLs;Independent with gait Driving: No Cognition Cognition Arousal/Alertness: Awake/alert Overall Cognitive Status: Appears within functional limits for tasks assessed Orientation Level: Oriented X4 Sensation/Coordination Sensation Light Touch: Appears Intact Coordination Gross Motor Movements are Fluid and Coordinated: Yes Extremity Assessment RUE Assessment RUE Assessment: Within Functional Limits LUE Assessment LUE Assessment: Within Functional Limits RLE Assessment RLE Assessment:  (Grossly 4-/5 strength - general weakness) LLE Assessment LLE Assessment:  (Grossly 4-/5 strength - general weakness) Mobility (including Balance) Bed Mobility Bed Mobility: Yes Supine to Sit: 7: Independent;HOB flat Transfers Transfers: Yes Sit to Stand: 7: Independent;With upper extremity assist;From bed Stand to Sit: 7: Independent;With upper extremity assist;To bed (Left sitting on edge of bed with family present) Ambulation/Gait Ambulation/Gait: Yes Ambulation/Gait Assistance: 7: Independent Ambulation Distance (Feet): 300 Feet Assistive device: None Gait Pattern: Step-through pattern;Trunk flexed (Occasional stagger step - patient self-corrects balance) Gait velocity: WFL Stairs: Yes Stairs Assistance: 6: Modified independent (Device/Increase time) Stair Management Technique: One rail Right;Alternating pattern;Forwards (Unable to amb steps without use of rail due to LE weakness) Number of Stairs: 5   Balance Balance Assessed: Yes Dynamic Gait Index Level Surface: Normal Change in Gait Speed: Mild Impairment Gait with Horizontal Head Turns:  Normal Gait with Vertical Head Turns: Normal Gait and Pivot Turn: Mild Impairment Step Over Obstacle: Normal Step Around Obstacles: Normal Steps: Mild Impairment Total Score: 21  Exercise  General Exercises - Lower Extremity Mini-Sqauts: AROM;Strengthening;Both;5 reps (5 reps each: Feet parallel; Rt  foot forward; Lt foot forward) End of Session PT - End of Session Activity Tolerance: Patient tolerated treatment well Patient left: in bed;with call bell in reach;with family/visitor present (sitting on edge of bed) Nurse Communication: Mobility status for ambulation General Behavior During Session: [facility] for tasks performed Cognition: [facility] for tasks performed  Vena Austria [phone] 10/05/2011, 3:31 PM

## 2011-10-05 NOTE — Progress Notes (Signed)
Subjective: Denies any complaints.  Review of systems: No headache, earache, sore throat, tooth pain, fever, chills, nausea, vomiting, diarrhea, cough, dyspnea, skin rashes, urinary frequency or dysuria.  Objective: Blood pressure 107/64, pulse 71, temperature 98.4 F (36.9 C), temperature source Oral, resp. rate 24, height 5\' 10"  (1.778 m), weight 51.9 kg (114 lb 6.7 oz), SpO2 99.00%.  Intake/Output Summary (Last 24 hours) at 10/05/11 0835 Last data filed at 10/04/11 1400  Gross per 24 hour  Intake    435 ml  Output    375 ml  Net     60 ml    General Exam: Comfortable. Looks older than stated age. Respiratory System: Clear. No increased work of breathing. Cardiovascular System: First and second heart sounds heard. Regular rate and rhythm. No JVD/murmurs. Telemetry shows sinus rhythm. No further episodes of wide complex tachycardia. Gastrointestinal System: Abdomen is non distended, soft and normal bowel sounds heard. Central Nervous System: Alert and oriented. No focal neurological deficits.  Lab Results: Basic Metabolic Panel:  Basename 10/05/11 0615 10/04/11 0943  NA 134* 135  K 4.9 4.1  CL 106 108  CO2 24 18*  GLUCOSE 94 101*  BUN 5* 2*  CREATININE 0.55 0.40*  CALCIUM 8.6 8.0*  MG -- 1.9  PHOS -- --   Liver Function Tests: No results found for this basename: AST:2,ALT:2,ALKPHOS:2,BILITOT:2,PROT:2,ALBUMIN:2 in the last 72 hours No results found for this basename: LIPASE:2,AMYLASE:2 in the last 72 hours No results found for this basename: AMMONIA:2 in the last 72 hours CBC:  Basename 10/05/11 0615 10/04/11 0943  WBC 22.2* 18.0*  NEUTROABS -- --  HGB 10.9* 10.8*  HCT 34.7* 34.6*  MCV 74.5* 75.7*  PLT 285 247   Cardiac Enzymes: No results found for this basename: CKTOTAL:3,CKMB:3,CKMBINDEX:3,TROPONINI:3 in the last 72 hours BNP: No results found for this basename: PROBNP:3 in the last 72 hours D-Dimer: No results found for this basename: DDIMER:2 in the last  72 hours CBG: No results found for this basename: GLUCAP:6 in the last 72 hours Hemoglobin A1C: No results found for this basename: HGBA1C in the last 72 hours Fasting Lipid Panel: No results found for this basename: CHOL,HDL,LDLCALC,TRIG,CHOLHDL,LDLDIRECT in the last 72 hours Thyroid Function Tests: No results found for this basename: TSH,T4TOTAL,FREET4,T3FREE,THYROIDAB in the last 72 hours Anemia Panel: No results found for this basename: VITAMINB12,FOLATE,FERRITIN,TIBC,IRON,RETICCTPCT in the last 72 hours Coagulation: No results found for this basename: LABPROT:2,INR:2 in the last 72 hours Urine Drug Screen: Drugs of Abuse     Component Value Date/Time   LABOPIA NONE DETECTED 10/01/2011 1712   COCAINSCRNUR NONE DETECTED 10/01/2011 1712   LABBENZ NONE DETECTED 10/01/2011 1712   AMPHETMU NONE DETECTED 10/01/2011 1712   THCU POSITIVE* 10/01/2011 1712   LABBARB NONE DETECTED 10/01/2011 1712    Alcohol Level: No results found for this basename: ETH:2 in the last 72 hours  Micro Results: Recent Results (from the past 240 hour(s))  MRSA PCR SCREENING     Status: Normal   Collection Time   10/01/11  1:15 PM      Component Value Range Status Comment   MRSA by PCR NEGATIVE  NEGATIVE  Final     Studies/Results: Dg Chest 2 View  10/01/2011  *RADIOLOGY REPORT*  Clinical Data: Shortness of breath, smoker.  CHEST - 2 VIEW  Comparison: None  Findings: Heart and mediastinal contours are within normal limits. No focal opacities or effusions.  No acute bony abnormality.  IMPRESSION: No active cardiopulmonary disease.  Original Report Authenticated By:  Cyndie Chime, M.D.   Ct Angio Chest W/cm &/or Wo Cm  10/01/2011  *RADIOLOGY REPORT*  Clinical Data: Shortness of breath.  Evaluate for pulmonary embolism.  CT ANGIOGRAPHY CHEST  Technique:  Multidetector CT imaging of the chest using the standard protocol during bolus administration of intravenous contrast. Multiplanar reconstructed images including  MIPs were obtained and reviewed to evaluate the vascular anatomy.  Contrast: 90mL OMNIPAQUE IOHEXOL 300 MG/ML IV SOLN  Comparison: Chest x-ray 10/01/2011.  Findings:  Mediastinum:  No filling defects are noted within the pulmonary arterial tree to suggest pulmonary emboli.  Heart size is normal. No pericardial fluid, thickening or calcification.  No acute abnormality of the thoracic aorta or other great vessels of the mediastinum.  There is atherosclerosis of the thoracic aorta, the great vessels of the mediastinum and the coronary arteries, including calcified atherosclerotic plaque in the left anterior descending left circumflex and right coronary arteries. No pathologically enlarged mediastinal or hilar lymph nodes. Mild circumferential thickening of the distal esophagus.  Lungs/Pleura:  No consolidative airspace disease.  No pleural effusions.  Images 59-61 of series 5 demonstrates a 7 mm nodular density in the left lower lobe.  This does not appear to be a parenchymal nodule, but rather appears to be associated with the bronchi extending to the posterobasal segment of the right lower lobe.  No additional suspicious appearing pulmonary nodules or masses are otherwise identified. However, there is a diffuse pattern of very fine centrilobular ground-glass attenuation micronodularity.  Upper Abdomen:  Visualized portions of the upper abdomen are unremarkable.  Musculoskeletal:  No aggressive appearing lytic or blastic lesions are noted in the visualized portions of the skeleton.  IMPRESSION:  1.  No evidence of pulmonary embolism. 2.  Pattern of a very fine centrilobular ground-glass attenuation micronodularity in the lungs bilaterally.  Appearance is suggestive of a smoking related disease such as RB (respiratory bronchiolitis), or if the patient is symptomatic, RB-ILD (respiratory bronchiolitis interstitial lung disease). 3. 7 mm nodular density in the right lower lobe, as above.  It is uncertain whether not this  represents a parenchymal lung nodule, or, if this may represent an endobronchial lesion within the bronchi extending to the posterior basal segment of the right lower lobe.  At this time, there are no postobstructive changes in the right lower lobe.  If the patient is at high risk for bronchogenic carcinoma, follow-up chest CT at 3-6 months is recommended.  If the patient is at low risk for bronchogenic carcinoma, follow-up chest CT at 6-12 months is recommended.  This recommendation follows the consensus statement: Guidelines for Management of Small Pulmonary Nodules Detected on CT Scans: A Statement from the Fleischner Society as published in Radiology 2005; 237:395-400. Online at: DietDisorder.cz. 4. Atherosclerosis, including three-vessel coronary artery disease. Please note that although the presence of coronary artery calcium documents the presence of coronary artery disease, the severity of this disease and any potential stenosis cannot be assessed on this non-gated CT examination.  Assessment for potential risk factor modification, dietary therapy or pharmacologic therapy may be warranted, if clinically indicated.  These results will be called to the ordering clinician or representative by the Radiologist Assistant, and communication documented in the PACS Dashboard.  Original Report Authenticated By: Florencia Reasons, M.D.    Medications: Scheduled Meds:    . feeding supplement  237 mL Oral TID WC  . ferrous sulfate  325 mg Oral BID WC  . folic acid  1 mg Oral Daily  .  mulitivitamin with minerals  1 tablet Oral Daily  . nicotine  21 mg Transdermal Daily  . pantoprazole  40 mg Oral Q0600  . thiamine  100 mg Oral Daily  . DISCONTD: ferrous sulfate  325 mg Oral BID WC   Continuous Infusions:    . DISCONTD: 0.9 % NaCl with KCl 20 mEq / L 75 mL/hr at 10/03/11 2127   PRN Meds:.acetaminophen, acetaminophen, ondansetron (ZOFRAN) IV,  ondansetron  Assessment/Plan: 1. Symptomatic severe microcytic anemia, possibly of iron deficiency secondary to slow/intermittent GI blood loss: Status post 5 units of packed red blood cell transfusion since admission: Improved. Unfortunately anemia panel was not sent prior to transfusions and hence will not be helpful to draw at this time. Continue oral ferrous sulfate. Hematology consulted secondary to worsening leukocytosis in the context of anemia on admission. 2. Slow GI bleeding: GI input appreciated. EGD showed distal esophagitis. Colonoscopy showed 3 large polyps in the mid transverse colon and rectum which were removed and moderate diverticulosis. Continue proton pump inhibitors. 3. Hypokalemia: Corrected. 4. Nonsustained wide complex tachycardia/NSVT on the night of 10/02/2011: 2-D echocardiogram shows left ventricle ejection fraction of 45-50% and no wall motion abnormalities. Outpatient evaluation including a stress test by [facility] cardiology. 5. Anion gap metabolic acidosis: Unclear etiology. Resolved. 6. Hyponatremia:? Secondary to dehydration versus SIADH. Resolved  7. Tobacco abuse: Cessation counseling. Continue nicotine patch 8. Substance abuse/tetrahydrocannabinol: Substance abuse counseling 9. Abnormal CT scan of the chest: very fine centrilobular ground-glass attenuation micronodularity in the lungs bilaterally. ? Respiratory bronchiolitis interstitial lung disease. Patient asymptomatic. Smoking cessation advised. Outpatient followup. 10. Right lower lobe pulmonary nodule on CT scan of the chest in patient with history of tobacco abuse: Tobacco cessation. Followup CT scan of the chest in 3-6 months. Consider pulmonary consultation at some point. 11. Leukocytosis:? Secondary to iron deficiency anemia versus stress. No clinical focus of infection. Hematology consulted. 12. Alcohol use/?? Abuse: Continue multivitamins. No features of withdrawal at this time.   Disposition: Possible  discharge tomorrow after input from hematology, arranging outpatient primary care physician to followup and? Pulmonary followup.  Ahkeem Goede 10/05/2011, 8:35 AM

## 2011-10-06 ENCOUNTER — Encounter: Payer: Self-pay | Admitting: Internal Medicine

## 2011-10-06 ENCOUNTER — Encounter (HOSPITAL_COMMUNITY): Payer: Self-pay | Admitting: Internal Medicine

## 2011-10-06 LAB — PROTEIN ELECTROPHORESIS, SERUM
Albumin ELP: 40.5 % — ABNORMAL LOW (ref 55.8–66.1)
Alpha-2-Globulin: 12.1 % — ABNORMAL HIGH (ref 7.1–11.8)
Beta Globulin: 6.7 % (ref 4.7–7.2)
M-Spike, %: NOT DETECTED g/dL
Total Protein ELP: 5.9 g/dL — ABNORMAL LOW (ref 6.0–8.3)

## 2011-10-06 MED ORDER — THIAMINE HCL 100 MG PO TABS: 100.0000 mg | ORAL_TABLET | Freq: Every day | ORAL | Status: DC

## 2011-10-06 MED ORDER — OMEPRAZOLE MAGNESIUM 20 MG PO TBEC: 20.0000 mg | DELAYED_RELEASE_TABLET | Freq: Every day | ORAL | Status: DC

## 2011-10-06 MED ORDER — ADULT MULTIVITAMIN W/MINERALS CH: 1.0000 | ORAL_TABLET | Freq: Every day | ORAL | Status: DC

## 2011-10-06 MED ORDER — NICOTINE 21 MG/24HR TD PT24: 1.0000 | MEDICATED_PATCH | Freq: Every day | TRANSDERMAL | Status: AC

## 2011-10-06 MED ORDER — FOLIC ACID 1 MG PO TABS: 1.0000 mg | ORAL_TABLET | Freq: Every day | ORAL | Status: DC

## 2011-10-06 MED ORDER — FERROUS SULFATE 325 (65 FE) MG PO TABS: 325.0000 mg | ORAL_TABLET | Freq: Two times a day (BID) | ORAL | Status: DC

## 2011-10-06 NOTE — Discharge Summary (Addendum)
Discharge Summary  James [name] MR#: [id number]  DOB:11-10-1955  Date of Admission: 10/01/2011 Date of Discharge: 10/06/2011  Patient's PCP: No primary provider on file. Has an appointment to be [facility]  Attending Physician:James Senn  Consults: 1. Gastroenterology: Dr. Yancey Flemings Hematology: Dr. Eli Hose  Discharge Diagnoses: 1. Symptomatic, severe microcytic anemia 2. Slow GI bleeding 3. Distal esophagitis by EGD 4. Anion gap metabolic acidosis, resolved 5. Hypokalemia 6. Hyponatremia 7. Tobacco abuse 8. Substance abuse/tetrahydrocannabinol 9. Abnormal CT scan of the chest : Outpatient followup with pulmonology 10. Right lower lobe pulmonary nodule on CT scan of the chest: Outpatient followup with pulmonology 11. Alcohol use 12. Nonsustained ventricular tachycardia: Outpatient followup with cardiology for stress test 13. Leukocytosis  Brief Admitting History and Physical 56 year old [place] American male patient with no significant past medical or surgical history, history of polysubstance abuse presented with several months history of progressive dyspnea on exertion and fatigue. He also had several months history of early satiety and dysphagia. He now came in with profound exhaustion and dyspnea and dizziness. No chest pain. In the emergency room patient was found to have hemoglobin of 3.5 an MCV in the 50s. He also had a bicarbonate of 14 and anion gap metabolic acidosis of unclear etiology. Is no history of aspirin or NSAID use. He was admitted for further evaluation and management.  Discharge Medications Current Discharge Medication List    START taking these medications   Details  ferrous sulfate 325 (65 FE) MG tablet Take 1 tablet (325 mg total) by mouth 2 (two) times daily with a meal. Qty: 60 tablet, Refills: 0    folic acid (FOLVITE) 1 MG tablet Take 1 tablet (1 mg total) by mouth daily.    Multiple Vitamin (MULITIVITAMIN WITH  MINERALS) TABS Take 1 tablet by mouth daily.    nicotine (NICODERM CQ - DOSED IN MG/24 HOURS) 21 mg/24hr patch Place 1 patch onto the skin daily. Qty: 28 patch, Refills: 0    omeprazole (PRILOSEC OTC) 20 MG tablet Take 1 tablet (20 mg total) by mouth daily. Qty: 30 tablet, Refills: 0    thiamine 100 MG tablet Take 1 tablet (100 mg total) by mouth daily.        Hospital Course:  1. Symptomatic severe microcytic anemia, possibly of iron deficiency secondary to slow/intermittent GI blood loss: Status post 5 units of packed red blood cell transfusion since admission: Stable. Unfortunately anemia panel was not sent prior to transfusions. Gastroenterology was consulted and completed her GI workup. Started on oral ferrous sulfate. Hematology was consulted and will followup in their office's to see if any further workup is required. 2. Slow GI bleeding: Gastroenterology was consulted. EGD showed distal esophagitis. Colonoscopy showed 3 large polyps in the mid transverse colon and rectum which were removed and moderate diverticulosis. Continue proton pump inhibitors. Clinically no overt bleeding. 3. Hypokalemia: Corrected the 4. Nonsustained wide complex tachycardia/NSVT on the night of 10/02/2011: 2-D echocardiogram shows left ventricle ejection fraction of 45-50% and no wall motion abnormalities. TSH was normal. Discussed with [facility] cardiology and they will arrange for outpatient followup with outpatient stress test. 5. Anion gap metabolic acidosis, on admission: Unclear etiology. Resolved. 6. Hyponatremia:? Secondary to dehydration versus SIADH. Resolved  7. Tobacco abuse: Cessation counseling. Continue nicotine patch 8. Substance abuse/tetrahydrocannabinol: Substance abuse counseled 9. Abnormal CT scan of the chest: very fine centrilobular ground-glass attenuation micronodularity in the lungs bilaterally. ? Respiratory bronchiolitis interstitial lung disease. Patient asymptomatic. Smoking  cessation advised. Discussed with a [facility] pulmonologist who have made an appointment for the patient to see them as an outpatient for further evaluation and followup. 10. Right lower lobe pulmonary nodule on CT scan of the chest in patient with history of tobacco abuse: Tobacco cessation. Followup CT scan of the chest in 3-6 months. Has appointment to see [facility] pulmonology as an outpatient for further evaluation and followup. 11. Leukocytosis: Most likely due to reactive process from his acute illness and severe anemia. Differential diagnosis include myelodysplastic syndrome/myeloproliferative disease. His peripheral smear had no features to suggest acute hematological process. Dr. Clelia Croft will followup with outpatient CBC and smear in 3-4 weeks and evaluate if any further evaluation is necessary. 12. Alcohol use/?? Abuse: Patient did not have any features of withdrawal. Moderation/abstinence from alcohol was counseled.      Day of Discharge BP 99/53  Pulse 68  Temp(Src) 98.1 F (36.7 C) (Oral)  Resp 20  Ht 5\' 10"  (1.778 m)  Wt 51.9 kg (114 lb 6.7 oz)  BMI 16.42 kg/m2  SpO2 97%  General Exam: Comfortable. Looks older than stated age.  Respiratory System: Clear. No increased work of breathing.  Cardiovascular System: First and second heart sounds heard. Regular rate and rhythm. No JVD/murmurs. Telemetry shows sinus rhythm. No further episodes of wide complex tachycardia.  Gastrointestinal System: Abdomen is non distended, soft and normal bowel sounds heard.  Central Nervous System: Alert and oriented. No focal neurological deficits.  Results for orders placed during the hospital encounter of 10/01/11 (from the past 48 hour(s))  CBC     Status: Abnormal   Collection Time   10/05/11  6:15 AM      Component Value Range Comment   WBC 22.2 (*) 4.0 - 10.5 (K/uL)    RBC 4.66  4.22 - 5.81 (MIL/uL)    Hemoglobin 10.9 (*) 13.0 - 17.0 (g/dL)    HCT 82.9 (*) 56.2 - 52.0 (%)    MCV 74.5 (*)  78.0 - 100.0 (fL)    MCH 23.4 (*) 26.0 - 34.0 (pg)    MCHC 31.4  30.0 - 36.0 (g/dL)    RDW 13.0 (*) 86.5 - 15.5 (%)    Platelets 285  150 - 400 (K/uL) PLATELET COUNT CONFIRMED BY SMEAR  BASIC METABOLIC PANEL     Status: Abnormal   Collection Time   10/05/11  6:15 AM      Component Value Range Comment   Sodium 134 (*) 135 - 145 (mEq/L)    Potassium 4.9  3.5 - 5.1 (mEq/L)    Chloride 106  96 - 112 (mEq/L)    CO2 24  19 - 32 (mEq/L)    Glucose, Bld 94  70 - 99 (mg/dL)    BUN 5 (*) 6 - 23 (mg/dL)    Creatinine, Ser 7.84  0.50 - 1.35 (mg/dL)    Calcium 8.6  8.4 - 10.5 (mg/dL)    GFR calc non Af Amer >90  >90 (mL/min)    GFR calc Af Amer >90  >90 (mL/min)    Lab data: 1. Magnesium 1.9. 2. LFTs significant for alkaline phosphatase 262, albumin 2.1, AST 33, ALT 17, total protein 6.1 and bilirubin 1.4 3. Serum protein electrophoresis showed total protein 5.9, alpha 1 globulin 7.4, alpha 2 globulin 12.1, beta globulin 6.7, beta 28.4, gamma globulin 25, no M spike was detected 4. D-dimer 1.33 and INR 1.18. 5. Serum salicylate less than 2 and serum acetaminophen level less than 15 6. Plasma cortisol  of 17.5 7. TSH 4.302 8. Urine protein electrophoresis: No M spike detected. Free kappa light chain 53.8 and free lambda light chain 6.17. 9. Blood alcohol level less than 11 10. Urine drug screen was positive for tetrahydrocannabinol   Studies/Results:   Dg Chest 2 View   10/01/2011 *RADIOLOGY REPORT* Clinical Data: Shortness of breath, smoker. CHEST - 2 VIEW IMPRESSION: No active cardiopulmonary disease.  Ct Angio Chest W/cm &/or Wo Cm   10/01/2011 *RADIOLOGY REPORT* Clinical Data: Shortness of breath. Evaluate for pulmonary embolism. CT ANGIOGRAPHY CHEST IMPRESSION: 1. No evidence of pulmonary embolism. 2. Pattern of a very fine centrilobular ground-glass attenuation micronodularity in the lungs bilaterally. Appearance is suggestive of a smoking related disease such as RB (respiratory  bronchiolitis), or if the patient is symptomatic, RB-ILD (respiratory bronchiolitis interstitial lung disease). 3. 7 mm nodular density in the right lower lobe, as above. It is uncertain whether not this represents a parenchymal lung nodule, or, if this may represent an endobronchial lesion within the bronchi extending to the posterior basal segment of the right lower lobe. At this time, there are no postobstructive changes in the right lower lobe. If the patient is at high risk for bronchogenic carcinoma, follow-up chest CT at 3-6 months is recommended. If the patient is at low risk for bronchogenic carcinoma, follow-up chest CT at 6-12 months is recommended. 4. Atherosclerosis, including three-vessel coronary artery disease.    2-D echocardiogram:  Study Conclusions  - Left ventricle: The cavity size was normal. Wall thickness was normal. Systolic function was mildly reduced. The estimated ejection fraction was in the range of 45% to 50%. Wall motion was normal; there were no regional wall motion abnormalities. Left ventricular diastolic function parameters were normal. - Aortic valve: Moderate regurgitation. - Mitral valve: Mild regurgitation.    Disposition: Discharged home in stable condition.  Diet: Heart healthy  Activity: Increase activity gradually  Follow-up Appts: Discharge Orders    Future Appointments: Provider: Department: Dept Phone: Center:   10/24/2011 2:15 PM Kalman Shan, MD [facility] [phone] None     Future Orders Please Complete By Expires   Diet - low sodium heart healthy      Increase activity slowly      Call MD for:  persistant dizziness or light-headedness      Call MD for:  difficulty breathing, headache or visual disturbances         TESTS THAT NEED FOLLOW-UP CBC in 3-4 weeks through Dr. Alver Fisher office.  Time spent on discharge, talking to the patient, and coordinating care: 40 mins.  At the time of this dictation pathology reports  are pending.  SignedMarcellus Scott, MD 10/06/2011, 4:06 PM

## 2011-10-06 NOTE — Progress Notes (Signed)
IV removed, pressure and dressing applied, site unremarkable.  Discharge instructions, follow-up appointments, and prescriptions given.  Pt given 30 day supply of Prilosec, provided by [facility] pharmacy.  All belongings sent with pt and all questions answered.  Pt transported in wheelchair by tech, and traveled home with family by private vehicle.

## 2011-10-24 ENCOUNTER — Ambulatory Visit (INDEPENDENT_AMBULATORY_CARE_PROVIDER_SITE_OTHER): Payer: Self-pay | Admitting: Internal Medicine

## 2011-10-24 ENCOUNTER — Other Ambulatory Visit: Payer: Self-pay

## 2011-10-24 ENCOUNTER — Encounter: Payer: Self-pay | Admitting: Internal Medicine

## 2011-10-24 VITALS — BP 132/74 | HR 83 | Temp 98.6°F | Ht 69.0 in | Wt 123.4 lb

## 2011-10-24 DIAGNOSIS — F17200 Nicotine dependence, unspecified, uncomplicated: Secondary | ICD-10-CM

## 2011-10-24 DIAGNOSIS — R609 Edema, unspecified: Secondary | ICD-10-CM

## 2011-10-24 DIAGNOSIS — R634 Abnormal weight loss: Secondary | ICD-10-CM

## 2011-10-24 DIAGNOSIS — J8410 Pulmonary fibrosis, unspecified: Secondary | ICD-10-CM

## 2011-10-24 DIAGNOSIS — J849 Interstitial pulmonary disease, unspecified: Secondary | ICD-10-CM | POA: Insufficient documentation

## 2011-10-24 DIAGNOSIS — M7989 Other specified soft tissue disorders: Secondary | ICD-10-CM

## 2011-10-24 DIAGNOSIS — R911 Solitary pulmonary nodule: Secondary | ICD-10-CM

## 2011-10-24 DIAGNOSIS — J984 Other disorders of lung: Secondary | ICD-10-CM

## 2011-10-24 DIAGNOSIS — R64 Cachexia: Secondary | ICD-10-CM | POA: Insufficient documentation

## 2011-10-24 DIAGNOSIS — Z720 Tobacco use: Secondary | ICD-10-CM

## 2011-10-24 MED ORDER — FUROSEMIDE 20 MG PO TABS: 20.0000 mg | ORAL_TABLET | Freq: Two times a day (BID) | ORAL | Status: DC

## 2011-10-24 MED ORDER — POTASSIUM CHLORIDE CRYS ER 10 MEQ PO TBCR: 10.0000 meq | EXTENDED_RELEASE_TABLET | Freq: Every day | ORAL | Status: DC

## 2011-10-24 NOTE — Assessment & Plan Note (Signed)
He is working on quitting by self. STates he is down to 7 cig per day

## 2011-10-24 NOTE — Assessment & Plan Note (Signed)
Probably due to etoh. But will check hiv

## 2011-10-24 NOTE — Assessment & Plan Note (Addendum)
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

## 2011-10-24 NOTE — Assessment & Plan Note (Signed)
?   Due to chf v cirrhosis  Plan Start lasix Keep up cards appt 11/03/11 ultrsound liver rule out cirrhosis

## 2011-10-24 NOTE — Patient Instructions (Signed)
#  Spot in lung and scarring in lung  - this is from smoking  - repeat ct scan of chest without contrast in 6 months #Smoking   - please work on quitting #WEight loss   - please give blood for hiv test today as we agreed upon #Leg swelling   - do not know why  - do ultrasound liver - rule out cirrhosis  - please start lasix 20mg  once daily along with potassium daily  - I am only giving you one month suppply  - follow with cardiology on 11/03/11 to discuss this further if from heart #Followup  6 months after CT scan chest  at followup do walk test and spirometry

## 2011-10-24 NOTE — Progress Notes (Signed)
Subjective:    Patient ID: James [name], male    DOB: May 29, 1956, 56 y.o.   MRN: [id number]  HPI  Date of Admission: 10/01/2011 . Date of Discharge: 10/06/2011   Discharge Diagnoses:  1. Symptomatic severe microcytic anemia, possibly of iron deficiency secondary to slow/intermittent GI blood loss: Status post 5 units of packed red blood cell transfusion since admission: Stable. Unfortunately anemia panel was not sent prior to transfusions. Gastroenterology was consulted and completed her GI workup. Started on oral ferrous sulfate. Hematology was consulted and will followup in their office's to see if any further workup is required. 2. Slow GI bleeding: Gastroenterology was consulted. EGD showed distal esophagitis. Colonoscopy showed 3 large polyps in the mid transverse colon and rectum which were removed and moderate diverticulosis. Continue proton pump inhibitors. Clinically no overt bleeding. 3. Nonsustained wide complex tachycardia/NSVT on the night of 10/02/2011: 2-D echocardiogram shows left ventricle ejection fraction of 45-50% and no wall motion abnormalities. TSH was normal. Discussed with [facility] cardiology and they will arrange for outpatient followup with outpatient stress test. Appt pending 11/03/11 4. Tobacco abuse: Cessation counseling. Continue nicotine patch 5. Substance abuse/tetrahydrocannabinol: Substance abuse counseled 6. Abnormal CT scan of the chest: very fine centrilobular ground-glass attenuation micronodularity in the lungs bilaterally. ? Respiratory bronchiolitis interstitial lung disease. Patient asymptomatic. Smoking cessation advised. Discussed with a [facility] pulmonologist who have made an appointment for the patient to see them as an outpatient for further evaluation and followup. 7. Right lower lobe pulmonary nodule on CT scan of the chest in patient with history of tobacco abuse: Tobacco cessation. Followup CT scan of the chest in 3-6 months. Has appointment to see  [facility] pulmonology as an outpatient for further evaluation and followup. 8. Uninsured. Unemployed. Lives with friends 9. Alcohol use/?? Abuse: Patient did not have any features of withdrawal. Moderation/abstinence from alcohol was counseled Cachexia  Body mass index is 18.22 kg/(m^2). on 10/24/2011 -> Body mass index is 18.22 kg/(m^2). on 10/24/2011    IOV 10/24/2011 56 year old male. No PMD. New pulmonary consult 10/24/11. He was admitted to [facility] with above medical problems. Pulmonary CT was abnormal and referred to outpatient pulm clinic. Currently feels well except for bilateral pedal edema x 4 days. Insidious onset. Progressive. Associated soreness for few days present. Static all day. No clear cut aggravating or relieving factors. Tried bengay and it did not help.  No associaed abdominal distension. This is a new problem for him  Denies resp complaints of dyspnea, chest pain, cough, fever, hemoptysis  Still smoking  reports that he has been smoking Cigarettes.  He has a 20 pack-year smoking history. He has never used smokeless tobacco. He has cut down to 7 cig/day.     Review of Systems  Constitutional: Negative for fever and unexpected weight change.  HENT: Negative for ear pain, nosebleeds, congestion, sore throat, rhinorrhea, sneezing, trouble swallowing, dental problem, postnasal drip and sinus pressure.   Eyes: Negative for redness and itching.  Respiratory: Negative for cough, chest tightness, shortness of breath and wheezing.   Cardiovascular: Positive for leg swelling. Negative for palpitations.  Gastrointestinal: Negative for nausea and vomiting.  Genitourinary: Negative for dysuria.  Musculoskeletal: Negative for joint swelling.  Skin: Negative for rash.  Neurological: Negative for headaches.  Hematological: Does not bruise/bleed easily.  Psychiatric/Behavioral: Negative for dysphoric mood. The patient is not nervous/anxious.        Objective:   Physical Exam  Nursing  note and vitals reviewed. Constitutional: He is  oriented to person, place, and time. He appears well-developed and well-nourished. No distress.       Cachexia Body mass index is 18.22 kg/(m^2). On 10/24/2011   HENT:  Head: Normocephalic and atraumatic.  Right Ear: External ear normal.  Left Ear: External ear normal.  Mouth/Throat: Oropharynx is clear and moist. No oropharyngeal exudate.       Smells of etoh  Eyes: Conjunctivae and EOM are normal. Pupils are equal, round, and reactive to light. Right eye exhibits no discharge. Left eye exhibits no discharge. No scleral icterus.  Neck: Normal range of motion. Neck supple. No JVD present. No tracheal deviation present. No thyromegaly present.  Cardiovascular: Normal rate, regular rhythm and intact distal pulses.  Exam reveals no gallop and no friction rub.   No murmur heard. Pulmonary/Chest: Effort normal and breath sounds normal. No respiratory distress. He has no wheezes. He has no rales. He exhibits no tenderness.  Abdominal: Soft. Bowel sounds are normal. He exhibits no distension and no mass. There is no tenderness. There is no rebound and no guarding.  Musculoskeletal: Normal range of motion. He exhibits edema. He exhibits no tenderness.       ++  Lymphadenopathy:    He has no cervical adenopathy.  Neurological: He is alert and oriented to person, place, and time. He has normal reflexes. No cranial nerve deficit. Coordination normal.  Skin: Skin is warm and dry. No rash noted. He is not diaphoretic. No erythema. No pallor.       Chronic folliculitis on back  Psychiatric: He has a normal mood and affect. His behavior is normal. Judgment and thought content normal.          Assessment & Plan:

## 2011-10-24 NOTE — Assessment & Plan Note (Addendum)
repoeat ct chest 6 months (Warned of cancer risk)

## 2011-10-30 ENCOUNTER — Inpatient Hospital Stay (HOSPITAL_COMMUNITY): Admission: RE | Admit: 2011-10-30 | Payer: Self-pay | Source: Ambulatory Visit

## 2011-10-31 ENCOUNTER — Encounter: Payer: Self-pay | Admitting: *Deleted

## 2011-10-31 ENCOUNTER — Encounter: Payer: Self-pay | Admitting: Cardiovascular Disease

## 2011-11-03 ENCOUNTER — Ambulatory Visit: Payer: 59 | Admitting: Cardiovascular Disease

## 2011-11-17 ENCOUNTER — Telehealth: Payer: Self-pay | Admitting: Internal Medicine

## 2011-11-17 NOTE — Telephone Encounter (Signed)
You ordered an ultrasound of the pt liver to r/o cirrhosis but pt no showed for the appt. [facility] is calling asking do you want them to attempt to r/s this with the pt? Please advise.Carron Curie, CMA

## 2011-11-18 NOTE — Telephone Encounter (Signed)
Yes, have them reschedule

## 2011-11-18 NOTE — Telephone Encounter (Signed)
I spoke with James [name] and she will r/s [place]. Carron Curie, CMA

## 2011-11-21 ENCOUNTER — Ambulatory Visit: Payer: Self-pay | Admitting: Cardiovascular Disease

## 2012-05-03 ENCOUNTER — Other Ambulatory Visit: Payer: Self-pay

## 2012-06-04 ENCOUNTER — Ambulatory Visit (INDEPENDENT_AMBULATORY_CARE_PROVIDER_SITE_OTHER): Payer: BC Managed Care – PPO | Admitting: Medical

## 2012-06-04 ENCOUNTER — Encounter: Payer: Self-pay | Admitting: Medical

## 2012-06-04 VITALS — BP 128/60 | HR 92 | Temp 98.3°F | Resp 16 | Wt 120.0 lb

## 2012-06-04 DIAGNOSIS — L729 Follicular cyst of the skin and subcutaneous tissue, unspecified: Secondary | ICD-10-CM

## 2012-06-04 DIAGNOSIS — L723 Sebaceous cyst: Secondary | ICD-10-CM

## 2012-06-04 MED ORDER — DOXYCYCLINE HYCLATE 100 MG PO TABS: 100.0000 mg | ORAL_TABLET | Freq: Two times a day (BID) | ORAL | Status: DC

## 2012-06-04 NOTE — Progress Notes (Signed)
Subjective:   HPI  James [name] is a 56 y.o. male who presents for cyst on right face.  He is a former pt here, last visit 2009.  He had a large swollen bump on right cheek earlier in the week but it ended up bursting and draining pus and blood.  Its now mostly resolved.  He notes hx/o prior similar, including some that have been lanced in the past on the right lower face. He also has left small cystic lesion of left upper cheek.  He denies fever, NVD. He is using warm compresses.  No other aggravating or relieving factors.    No other c/o.  The following portions of the patient's history were reviewed and updated as appropriate: allergies, current medications, past family history, past medical history, past social history, past surgical history and problem list.  Past Medical History  Diagnosis Date  . Anemia   . GERD (gastroesophageal reflux disease)   . Dysphagia   . Alcohol abuse   . Anemia due to GI blood loss   . Benign neoplasm of colon   . Cachexia   . Edema   . ILD (interstitial lung disease)   . Iron deficiency anemia, unspecified   . Nonspecific abnormal finding in stool contents   . Pulmonary nodule   . Reflux esophagitis   . Tobacco abuse   . Weight loss, abnormal     No Known Allergies   Review of Systems ROS reviewed and was negative other than noted in HPI or above.    Objective:   Physical Exam  General appearance: alert, no distress, WD/WN Right cheek with 1.5 cm somewhat raised area with central pore and minimal drainage.   No erythema, induration, warmth.  Few other small pustular lesions of both cheeks present.   Assessment and Plan :     Encounter Diagnosis  Name Primary?  . Cyst of skin Yes   Discussed options including continued warm compresses and antibiotic vs this plus I&D to try and remove capsule.  He wants to just try antibiotic and c/t warm compresses for now.  Thus, he will begin Doxycycline, warm compresses, and this will hopefully  resolve completely.  Recheck if not.  In general, return for physical soon as last visit here 2009.

## 2013-01-06 ENCOUNTER — Telehealth: Payer: Self-pay | Admitting: Internal Medicine

## 2013-01-06 NOTE — Telephone Encounter (Signed)
He was supposted to see me in June 2013 for ILD fu but has not. He does not have IPF but still needs fu. First avail fine   Dr. Kalman Shan, M.D., [facility].C.P Pulmonary and Critical Care Medicine Staff Physician [facility] Pager: [phone], If no answer or between  15:00h - 7:00h: call [phone]  01/06/2013 5:12 PM

## 2013-01-07 NOTE — Telephone Encounter (Signed)
[email or web address].Sadrac Zeoli, CMA  

## 2013-01-13 NOTE — Telephone Encounter (Signed)
LMTCBx2. Jayani Rozman, CMA  

## 2013-01-27 ENCOUNTER — Encounter: Payer: Self-pay | Admitting: *Deleted

## 2013-01-27 NOTE — Telephone Encounter (Signed)
Letter mailed. Jennifer Castillo, CMA  

## 2013-02-02 ENCOUNTER — Encounter: Payer: Self-pay | Admitting: Family Medicine

## 2013-02-02 ENCOUNTER — Ambulatory Visit (INDEPENDENT_AMBULATORY_CARE_PROVIDER_SITE_OTHER): Payer: BC Managed Care – PPO | Admitting: Family Medicine

## 2013-02-02 VITALS — BP 120/68 | HR 86 | Wt 120.0 lb

## 2013-02-02 DIAGNOSIS — N529 Male erectile dysfunction, unspecified: Secondary | ICD-10-CM

## 2013-02-02 DIAGNOSIS — M7520 Bicipital tendinitis, unspecified shoulder: Secondary | ICD-10-CM

## 2013-02-02 DIAGNOSIS — M7521 Bicipital tendinitis, right shoulder: Secondary | ICD-10-CM

## 2013-02-02 DIAGNOSIS — L723 Sebaceous cyst: Secondary | ICD-10-CM

## 2013-02-02 MED ORDER — AVANAFIL 100 MG PO TABS: 100.0000 | ORAL_TABLET | ORAL | Status: DC

## 2013-02-02 NOTE — Patient Instructions (Signed)
Use heat to the area for 20 minutes 3 times per day. Take 2 Aleve twice per day. Be careful when you raise her arm up. If he keeps bothering you in a couple weeks come back

## 2013-02-02 NOTE — Progress Notes (Signed)
  Subjective:    Patient ID: James [name], male    DOB: 12/16/1955, 57 y.o.   MRN: [id number]  HPI 5 days ago while moving some furniture, he felt a popping sensation in his right shoulder. He was able to continue to work without difficulty however the next day noted pain in the a.c. Joint. He notes that when he slightly abducts and internally rotates his shoulder, he will have more pain and it will shoot down into his fingers. He has tried [facility] powders without success. He also complains of a 3 year history of difficulty with acne on his back. He has tried OTC acne medication no benefit.   Review of Systems     Objective:   Physical Exam Alert and in no distress. Exam of his back shows multiple cystic lesions. Right shoulder exam shows pain on abduction. He is tender to palpation over the bicipital groove. No laxity is noted. Drop arm test was uncomfortable. Slight crepitus noted with near his test.  At the end of the encounter he then mentioned difficulty with erectile dysfunction. A sample of a need the med was given however I recommend he come back for complete examination since it has been several years. He apparently has his 20 year wedding anniversary in the next several days.     Assessment & Plan:  Sebaceous cyst - Plan: Ambulatory referral to Dermatology  Bicipital tendinitis of right shoulder  ED (erectile dysfunction) - Plan: Avanafil (STENDRA) 100 MG TABS  recommend conservative care for the right shoulder with heat, anti-inflammatory and avoiding any activity that would cause undue discomfort. Referral to dermatology at his request to see if anything can be done for his back.

## 2013-03-02 ENCOUNTER — Encounter: Payer: Self-pay | Admitting: Internal Medicine

## 2013-03-09 ENCOUNTER — Ambulatory Visit (INDEPENDENT_AMBULATORY_CARE_PROVIDER_SITE_OTHER): Payer: BC Managed Care – PPO | Admitting: Family Medicine

## 2013-03-09 ENCOUNTER — Encounter: Payer: Self-pay | Admitting: Family Medicine

## 2013-03-09 VITALS — BP 150/70 | Temp 98.4°F | Wt 119.0 lb

## 2013-03-09 DIAGNOSIS — K5289 Other specified noninfective gastroenteritis and colitis: Secondary | ICD-10-CM

## 2013-03-09 DIAGNOSIS — K529 Noninfective gastroenteritis and colitis, unspecified: Secondary | ICD-10-CM

## 2013-03-09 NOTE — Progress Notes (Signed)
  Subjective:    Patient ID: James [name], male    DOB: 1956-03-10, 57 y.o.   MRN: [id number]  HPI He is here for evaluation of an upset stomach. 2 days ago he had difficulty with vomiting and diarrhea and he missed several days of work. He is now feeling better and needs a note for work. He states he is now doing well and not having any difficulties.   Review of Systems     Objective:   Physical Exam alert and in no distress. Tympanic membranes and canals are normal. Throat is clear. Tonsils are normal. Neck is supple without adenopathy or thyromegaly. Cardiac exam shows a regular sinus rhythm without murmurs or gallops. Lungs are clear to auscultation. Smells of alcohol but denies drinking       Assessment & Plan:  Acute gastroenteritis  note given to return to work.

## 2013-03-18 ENCOUNTER — Encounter: Payer: BC Managed Care – PPO | Admitting: Family Medicine

## 2015-01-01 ENCOUNTER — Inpatient Hospital Stay (HOSPITAL_COMMUNITY)
Admission: EM | Admit: 2015-01-01 | Discharge: 2015-01-05 | DRG: 377 | Disposition: A | Discharge status: Home / Self Care | Payer: BC Managed Care – PPO | Attending: Internal Medicine | Admitting: Internal Medicine

## 2015-01-01 ENCOUNTER — Encounter (HOSPITAL_COMMUNITY): Payer: Self-pay | Admitting: Emergency Medicine

## 2015-01-01 DIAGNOSIS — R079 Chest pain, unspecified: Secondary | ICD-10-CM

## 2015-01-01 DIAGNOSIS — I493 Ventricular premature depolarization: Secondary | ICD-10-CM | POA: Diagnosis present

## 2015-01-01 DIAGNOSIS — R0600 Dyspnea, unspecified: Secondary | ICD-10-CM

## 2015-01-01 DIAGNOSIS — D649 Anemia, unspecified: Secondary | ICD-10-CM

## 2015-01-01 DIAGNOSIS — I472 Ventricular tachycardia: Secondary | ICD-10-CM | POA: Diagnosis present

## 2015-01-01 DIAGNOSIS — E43 Unspecified severe protein-calorie malnutrition: Secondary | ICD-10-CM | POA: Diagnosis present

## 2015-01-01 DIAGNOSIS — F17210 Nicotine dependence, cigarettes, uncomplicated: Secondary | ICD-10-CM | POA: Diagnosis present

## 2015-01-01 DIAGNOSIS — Z681 Body mass index (BMI) 19 or less, adult: Secondary | ICD-10-CM | POA: Diagnosis not present

## 2015-01-01 DIAGNOSIS — F1020 Alcohol dependence, uncomplicated: Secondary | ICD-10-CM | POA: Diagnosis present

## 2015-01-01 DIAGNOSIS — K922 Gastrointestinal hemorrhage, unspecified: Secondary | ICD-10-CM | POA: Diagnosis present

## 2015-01-01 DIAGNOSIS — E162 Hypoglycemia, unspecified: Secondary | ICD-10-CM | POA: Diagnosis present

## 2015-01-01 DIAGNOSIS — Z86010 Personal history of colonic polyps: Secondary | ICD-10-CM

## 2015-01-01 DIAGNOSIS — R64 Cachexia: Secondary | ICD-10-CM | POA: Diagnosis present

## 2015-01-01 DIAGNOSIS — I351 Nonrheumatic aortic (valve) insufficiency: Secondary | ICD-10-CM | POA: Diagnosis present

## 2015-01-01 DIAGNOSIS — R634 Abnormal weight loss: Secondary | ICD-10-CM

## 2015-01-01 DIAGNOSIS — K222 Esophageal obstruction: Secondary | ICD-10-CM | POA: Diagnosis present

## 2015-01-01 DIAGNOSIS — J849 Interstitial pulmonary disease, unspecified: Secondary | ICD-10-CM | POA: Diagnosis present

## 2015-01-01 DIAGNOSIS — D500 Iron deficiency anemia secondary to blood loss (chronic): Secondary | ICD-10-CM | POA: Diagnosis not present

## 2015-01-01 DIAGNOSIS — Z720 Tobacco use: Secondary | ICD-10-CM | POA: Diagnosis present

## 2015-01-01 DIAGNOSIS — E871 Hypo-osmolality and hyponatremia: Secondary | ICD-10-CM | POA: Diagnosis present

## 2015-01-01 DIAGNOSIS — D62 Acute posthemorrhagic anemia: Secondary | ICD-10-CM | POA: Diagnosis present

## 2015-01-01 DIAGNOSIS — K219 Gastro-esophageal reflux disease without esophagitis: Secondary | ICD-10-CM | POA: Diagnosis present

## 2015-01-01 DIAGNOSIS — F1210 Cannabis abuse, uncomplicated: Secondary | ICD-10-CM | POA: Diagnosis present

## 2015-01-01 DIAGNOSIS — F1010 Alcohol abuse, uncomplicated: Secondary | ICD-10-CM | POA: Diagnosis not present

## 2015-01-01 DIAGNOSIS — K2970 Gastritis, unspecified, without bleeding: Secondary | ICD-10-CM

## 2015-01-01 DIAGNOSIS — K2990 Gastroduodenitis, unspecified, without bleeding: Secondary | ICD-10-CM

## 2015-01-01 DIAGNOSIS — R7989 Other specified abnormal findings of blood chemistry: Secondary | ICD-10-CM | POA: Diagnosis not present

## 2015-01-01 DIAGNOSIS — R1314 Dysphagia, pharyngoesophageal phase: Secondary | ICD-10-CM | POA: Diagnosis not present

## 2015-01-01 DIAGNOSIS — R0789 Other chest pain: Secondary | ICD-10-CM | POA: Diagnosis not present

## 2015-01-01 LAB — BASIC METABOLIC PANEL
ANION GAP: 16 — AB (ref 5–15)
BUN: 6 mg/dL (ref 6–23)
CALCIUM: 8.3 mg/dL — AB (ref 8.4–10.5)
CO2: 19 mmol/L (ref 19–32)
CREATININE: 0.68 mg/dL (ref 0.50–1.35)
Chloride: 97 mmol/L (ref 96–112)
GFR calc Af Amer: >90 mL/min (ref >90)
GFR calc non Af Amer: >90 mL/min (ref >90)
GLUCOSE: 69 mg/dL — AB (ref 70–99)
POTASSIUM: 4.2 mmol/L (ref 3.5–5.1)
SODIUM: 132 mmol/L — AB (ref 135–145)

## 2015-01-01 LAB — CBC
HEMATOCRIT: 19.3 % — AB (ref 39.0–52.0)
HEMOGLOBIN: 4.9 g/dL — AB (ref 13.0–17.0)
MCH: 14.7 pg — ABNORMAL LOW (ref 26.0–34.0)
MCHC: 25.4 g/dL — AB (ref 30.0–36.0)
MCV: 57.8 fL — AB (ref 78.0–100.0)
Platelets: 301 10*3/uL (ref 150–400)
RBC: 3.34 MIL/uL — ABNORMAL LOW (ref 4.22–5.81)
RDW: 23.6 % — AB (ref 11.5–15.5)
WBC: 9.6 10*3/uL (ref 4.0–10.5)

## 2015-01-01 LAB — BRAIN NATRIURETIC PEPTIDE: B Natriuretic Peptide: 20.4 pg/mL (ref 0.0–100.0)

## 2015-01-01 LAB — HEPATIC FUNCTION PANEL
ALBUMIN: 2.8 g/dL — AB (ref 3.5–5.2)
ALK PHOS: 135 U/L — AB (ref 39–117)
ALT: 25 U/L (ref 0–53)
AST: 44 U/L — ABNORMAL HIGH (ref 0–37)
BILIRUBIN INDIRECT: 0.6 mg/dL (ref 0.3–0.9)
Bilirubin, Direct: 0.2 mg/dL (ref 0.0–0.5)
TOTAL PROTEIN: 6.9 g/dL (ref 6.0–8.3)
Total Bilirubin: 0.8 mg/dL (ref 0.3–1.2)

## 2015-01-01 LAB — I-STAT TROPONIN, ED
TROPONIN I, POC: 0 ng/mL (ref 0.00–0.08)
Troponin i, poc: 0 ng/mL (ref 0.00–0.08)

## 2015-01-01 LAB — PROTIME-INR
INR: 1.02 (ref 0.00–1.49)
Prothrombin Time: 13.5 seconds (ref 11.6–15.2)

## 2015-01-01 LAB — PREPARE RBC (CROSSMATCH)

## 2015-01-01 MED ORDER — IOHEXOL 300 MG/ML  SOLN
25.0000 mL | INTRAMUSCULAR | Status: AC
  Administered 2015-01-01 – 2015-01-02 (×2): 25 mL via ORAL

## 2015-01-01 MED ORDER — FOLIC ACID 1 MG PO TABS
1.0000 mg | ORAL_TABLET | Freq: Every day | ORAL | Status: DC
  Administered 2015-01-02 – 2015-01-05 (×4): 1 mg via ORAL
  Filled 2015-01-01 (×4): qty 1

## 2015-01-01 MED ORDER — THIAMINE HCL 100 MG/ML IJ SOLN
100.0000 mg | Freq: Every day | INTRAMUSCULAR | Status: DC
  Filled 2015-01-01 (×4): qty 1

## 2015-01-01 MED ORDER — SODIUM CHLORIDE 0.9 % IV SOLN
INTRAVENOUS | Status: DC
  Administered 2015-01-02: 03:00:00 via INTRAVENOUS

## 2015-01-01 MED ORDER — SODIUM CHLORIDE 0.9 % IV SOLN
10.0000 mL/h | Freq: Once | INTRAVENOUS | Status: AC
  Administered 2015-01-01: 10 mL/h via INTRAVENOUS

## 2015-01-01 MED ORDER — ADULT MULTIVITAMIN W/MINERALS CH
1.0000 | ORAL_TABLET | Freq: Every day | ORAL | Status: DC
  Administered 2015-01-02 – 2015-01-05 (×4): 1 via ORAL
  Filled 2015-01-01 (×4): qty 1

## 2015-01-01 MED ORDER — M.V.I. ADULT IV INJ
INJECTION | Freq: Once | INTRAVENOUS | Status: DC
  Filled 2015-01-01: qty 1000

## 2015-01-01 MED ORDER — LORAZEPAM 1 MG PO TABS: 1.0000 mg | ORAL_TABLET | Freq: Four times a day (QID) | ORAL | Status: AC | PRN

## 2015-01-01 MED ORDER — PANTOPRAZOLE SODIUM 40 MG IV SOLR: 40.0000 mg | Freq: Two times a day (BID) | INTRAVENOUS | Status: DC

## 2015-01-01 MED ORDER — VITAMIN B-1 100 MG PO TABS
100.0000 mg | ORAL_TABLET | Freq: Every day | ORAL | Status: DC
  Administered 2015-01-02 – 2015-01-05 (×4): 100 mg via ORAL
  Filled 2015-01-01 (×4): qty 1

## 2015-01-01 MED ORDER — SODIUM CHLORIDE 0.9 % IV SOLN
8.0000 mg/h | INTRAVENOUS | Status: DC
  Administered 2015-01-01: 8 mg/h via INTRAVENOUS
  Filled 2015-01-01 (×3): qty 80

## 2015-01-01 MED ORDER — LORAZEPAM 2 MG/ML IJ SOLN: 1.0000 mg | Freq: Four times a day (QID) | INTRAMUSCULAR | Status: AC | PRN

## 2015-01-01 MED ORDER — PANTOPRAZOLE SODIUM 40 MG IV SOLR
80.0000 mg | Freq: Once | INTRAVENOUS | Status: AC
  Administered 2015-01-01: 80 mg via INTRAVENOUS
  Filled 2015-01-01: qty 80

## 2015-01-01 NOTE — H&P (Addendum)
Hospitalist Admission History and Physical  Patient name: James [name] Medical record number: [id number] Date of birth: 06/25/1956 Age: 59 y.o. Gender: male  Primary Care Provider: Wyatt Haste, MD Gastroenteroly: Velora Heckler Henrene Pastor) Chief Complaint: GIB, anemia, dyspnea   History of Present Illness:This is a 59 y.o. year old male with significant past medical history of anemia, cachexia, ETOH abuse, tobacco abuse, aortic insufficiency   presenting with GIB, anemia, dyspnea. Patient reports progressive shortness of breath over the past 2 or 3 weeks. Being unable to take 2-3 steps feeling significantly short of breath. No chest pains. Mild wheezing in the setting of tobacco abuse. Also reports at least 10-20 pound weight loss over the past 2-3 months. No fevers or chills. No night sweats. Has had recurrent episodes of emesis over the past 2-3 weeks. Nonbilious nonbloody. Unable to tolerate by mouth intake. Also with recurring episodes of diarrhea. Has noticed black stools over the past 2-3 days. Everyday drinker. Gentle ease 2-3 beers per day. Drinks very heavily on the weekend up to 12 beers daily. Reports having colonoscopy approximately 2 years ago where to 3 polyps were removed per patient. No reported NSAID use. Present to the ER afebrile, hemodynamically stable. Labs notable for hemoglobin 4.9. Hemoccult-positive. LFTs and CT of the chest abdomen and pelvis are pending. Pending 2 units of packed red cells for transfusion. Assessment and Plan: * Active Problems:   GIB (gastrointestinal bleeding)   Anemia   Dyspnea   1-GIB -hemoccult positive on presentation -noted admission 09/2011 for similar sxs  -+ ETOH abuse -transfuse pRBCs  -protonix gtt  -propranolol as BP tolerates in setting of ETOH abuse -GI c/s in am   2-Anemia -likely secondary to acute blood loss in setting of above -transfuse pRBCs -anemia panel  -serial CBCs   3- Dyspnea -likely symptomatic anemia in  setting of above -CT chest pending -2D ECHO  -trop neg x1  -EKG sinus tach w/ occ PVCs -tele bed  -follow   4-ETOH abuse -banana bag  -CIWA protocol   FEN/GI: NPO for now  Prophylaxis: SCDs  Disposition: pending further evaluation  Code Status:Full Code    Patient Active Problem List   Diagnosis Date Noted  . GIB (gastrointestinal bleeding) 01/01/2015  . ILD (interstitial lung disease) 10/24/2011  . Pulmonary nodule 10/24/2011  . Edema 10/24/2011  . Cachexia 10/24/2011  . Benign neoplasm of colon 10/03/2011  . Reflux esophagitis 10/02/2011  . Anemia due to GI blood loss 10/01/2011  . Tobacco abuse 10/01/2011  . Alcohol abuse 10/01/2011  . Dysphagia, unspecified(787.20) 10/01/2011  . Iron deficiency anemia, unspecified 10/01/2011  . Weight loss, abnormal 10/01/2011  . Nonspecific abnormal finding in stool contents 10/01/2011   Past Medical History: Past Medical History  Diagnosis Date  . Anemia   . GERD (gastroesophageal reflux disease)   . Dysphagia   . Alcohol abuse   . Anemia due to GI blood loss   . Benign neoplasm of colon   . Cachexia   . Edema   . ILD (interstitial lung disease)   . Iron deficiency anemia, unspecified   . Nonspecific abnormal finding in stool contents   . Pulmonary nodule   . Reflux esophagitis   . Tobacco abuse   . Weight loss, abnormal   . Smoker   . ED (erectile dysfunction)   . Aortic insufficiency     Past Surgical History: Past Surgical History  Procedure Laterality Date  . Esophagogastroduodenoscopy  10/02/2011    Procedure: ESOPHAGOGASTRODUODENOSCOPY (EGD);  Surgeon: Scarlette Shorts, MD;  Location: [facility] ENDOSCOPY;  Service: Endoscopy;  Laterality: N/A;  . Colonoscopy  10/03/2011    Procedure: COLONOSCOPY;  Surgeon: Scarlette Shorts, MD;  Location: [facility];  Service: Endoscopy;  Laterality: N/A;    Social History: History   Social History  . Marital Status: Married    Spouse Name: N/A  . Number of Children: N/A  . Years of  Education: N/A   Occupational History  . unemployed    Social History Main Topics  . Smoking status: Current Every Day Smoker -- 1.00 packs/day for 20 years    Types: Cigarettes  . Smokeless tobacco: Never Used     Comment: pt states he is down to 7 cigs per day  . Alcohol Use: 1.2 oz/week    2 Cans of beer per week  . Drug Use: Yes    Special: Marijuana  . Sexual Activity: Not Currently   Other Topics Concern  . None   Social History Narrative    Family History: Family History  Problem Relation Age of Onset  . Hypertension Mother   . Diabetes Maternal Grandfather     Allergies: No Known Allergies  Current Facility-Administered Medications  Medication Dose Route Frequency Provider Last Rate Last Dose  . 0.9 %  sodium chloride infusion   Intravenous Continuous Deneise Lever, MD      . Derrill Memo ON 8/89/1694] folic acid (FOLVITE) tablet 1 mg  1 mg Oral Daily Deneise Lever, MD      . iohexol (OMNIPAQUE) 300 MG/ML solution 25 mL  25 mL Oral Q1 Hr x 2 Medication Radiologist, MD   25 mL at 01/01/15 2218  . LORazepam (ATIVAN) tablet 1 mg  1 mg Oral Q6H PRN Deneise Lever, MD       Or  . LORazepam (ATIVAN) injection 1 mg  1 mg Intravenous Q6H PRN Deneise Lever, MD      . Derrill Memo ON 01/02/2015] multivitamin with minerals tablet 1 tablet  1 tablet Oral Daily Deneise Lever, MD      . sodium chloride 0.9 % 1,000 mL with thiamine 503 mg, folic acid 1 mg, multivitamins adult 10 mL infusion   Intravenous Once Deneise Lever, MD      . Derrill Memo ON 01/02/2015] thiamine (VITAMIN B-1) tablet 100 mg  100 mg Oral Daily Deneise Lever, MD       Or  . Derrill Memo ON 01/02/2015] thiamine (B-1) injection 100 mg  100 mg Intravenous Daily Deneise Lever, MD       Current Outpatient Prescriptions  Medication Sig Dispense Refill  . Avanafil (STENDRA) 100 MG TABS Take 100 capsules by mouth 1 day or 1 dose. 3 tablet 0   Review Of Systems: 12 point ROS negative except as noted above in  HPI.  Physical Exam: Filed Vitals:   01/01/15 2145  BP: 114/72  Pulse: 96  Temp:   Resp: 18    General: alert, cooperative and cachectic HEENT: PERRLA, extra ocular movement intact and dry oral mucosa Heart: S1, S2 normal, no murmur, rub or gallop, regular rate and rhythm Lungs: clear to auscultation, no wheezes or rales and unlabored breathing Abdomen: abdomen is soft without significant tenderness, masses, organomegaly or guarding Extremities: extremities normal, atraumatic, no cyanosis or edema Skin:no rashes Neurology: normal without focal findings  Labs and Imaging: Lab Results  Component Value Date/Time   NA 132* 01/01/2015 07:57 PM   K 4.2 01/01/2015 07:57 PM  CL 97 01/01/2015 07:57 PM   CO2 19 01/01/2015 07:57 PM   BUN 6 01/01/2015 07:57 PM   CREATININE 0.68 01/01/2015 07:57 PM   GLUCOSE 69* 01/01/2015 07:57 PM   Lab Results  Component Value Date   WBC 9.6 01/01/2015   HGB 4.9* 01/01/2015   HCT 19.3* 01/01/2015   MCV 57.8* 01/01/2015   PLT 301 01/01/2015    No results found.         Shanda Howells MD  Pager: [phone]

## 2015-01-01 NOTE — ED Notes (Signed)
Pt reports drinking 12 pack of beer with a pint of liquor on weekends, "couple beers" on weekdays.  Pt reports chronic ETOH use.

## 2015-01-01 NOTE — ED Notes (Signed)
Patient here with shortness of breath which began 1 week ago. States stomach virus last week, but otherwise has felt well. Presents tonight with daughters encouragement. Denies chest pain right now, but states chest pain with ambulation.

## 2015-01-01 NOTE — ED Provider Notes (Signed)
Complains of progressively worsening generalized weakness for approximately the past 2 months. Accompanied by dyspnea on exertion patient states he can't walk across the room without becoming more dyspneic. Denies chest pain He is presently asymptomatic. On exam alert no distress, chronically ill-appearing, cachectic  James Dakin, MD 01/02/15 [phone]

## 2015-01-01 NOTE — ED Provider Notes (Signed)
CSN: [id number]     Arrival date & time 01/01/15  1926 History   First MD Initiated Contact with Patient 01/01/15 2049     Chief Complaint  Patient presents with  . Shortness of Breath     (Consider location/radiation/quality/duration/timing/severity/associated sxs/prior Treatment) HPI Comments: This is 59 year old gentleman who presents with 1 week of increasing shortness of breath with exertion.  He states last week he had a GI upset that has not totally resolved.  He states he's been having difficulty digesting his food for the past several months.  He states he will eat dinner but it sits in his chest and in the morning.  He vomits.  He also reports a 20 pound plus weight loss in the past month. He states that his stools have become black and tarry in the past week. He denies having any chest pain, shortness of breath at rest. He does drink alcohol on a daily basis during the week.  He drinks 2-3 beers on the weekend.  He drinks a case of beer plus a pint of hard liquor.  He's been doing this for his entire adult life.  He does smoke cigarettes on a daily basis.  He does have a history of having a blood transfusion in the past for him to medical anemia without specific cause  Patient is a 59 y.o. male presenting with shortness of breath. The history is provided by the patient.  Shortness of Breath Severity:  Severe Onset quality:  Gradual Duration:  1 week Timing:  Constant Progression:  Worsening Chronicity:  New Context: activity   Relieved by:  Rest Worsened by:  Activity Associated symptoms: vomiting   Associated symptoms: no abdominal pain, no diaphoresis and no syncope   Vomiting:    Quality:  Undigested food   Number of occurrences:  In the morning   Severity:  Mild   Timing:  Constant Risk factors: alcohol use     Past Medical History  Diagnosis Date  . Anemia   . GERD (gastroesophageal reflux disease)   . Dysphagia   . Alcohol abuse   . Anemia due to GI blood  loss   . Benign neoplasm of colon   . Cachexia   . Edema   . ILD (interstitial lung disease)   . Iron deficiency anemia, unspecified   . Nonspecific abnormal finding in stool contents   . Pulmonary nodule   . Reflux esophagitis   . Tobacco abuse   . Weight loss, abnormal   . Smoker   . ED (erectile dysfunction)   . Aortic insufficiency    Past Surgical History  Procedure Laterality Date  . Esophagogastroduodenoscopy  10/02/2011    Procedure: ESOPHAGOGASTRODUODENOSCOPY (EGD);  Surgeon: Scarlette Shorts, MD;  Location: [facility] ENDOSCOPY;  Service: Endoscopy;  Laterality: N/A;  . Colonoscopy  10/03/2011    Procedure: COLONOSCOPY;  Surgeon: Scarlette Shorts, MD;  Location: [place];  Service: Endoscopy;  Laterality: N/A;   Family History  Problem Relation Age of Onset  . Hypertension Mother   . Diabetes Maternal Grandfather    History  Substance Use Topics  . Smoking status: Current Every Day Smoker -- 1.00 packs/day for 20 years    Types: Cigarettes  . Smokeless tobacco: Never Used     Comment: pt states he is down to 7 cigs per day  . Alcohol Use: 1.2 oz/week    2 Cans of beer per week    Review of Systems  Constitutional: Positive for activity change and  unexpected weight change. Negative for chills, diaphoresis, appetite change and fatigue.  Respiratory: Positive for shortness of breath.   Cardiovascular: Negative for syncope.  Gastrointestinal: Positive for vomiting and blood in stool. Negative for nausea, abdominal pain, diarrhea and abdominal distention.  Genitourinary: Negative for dysuria and frequency.  All other systems reviewed and are negative.     Allergies  Review of patient's allergies indicates no known allergies.  Home Medications   Prior to Admission medications   Medication Sig Start Date End Date Taking? Authorizing Provider  Avanafil (STENDRA) 100 MG TABS Take 100 capsules by mouth 1 day or 1 dose. 02/02/13   Denita Lung, MD   BP 114/72 mmHg  Pulse 96   Temp(Src) 98.4 F (36.9 C) (Oral)  Resp 18  Ht 5\' 10"  (1.778 m)  Wt 92 lb 8 oz (41.958 kg)  BMI 13.27 kg/m2  SpO2 100% Physical Exam  Constitutional: He appears cachectic. No distress.  HENT:  Head: Normocephalic.  Mouth/Throat: Oropharynx is clear and moist.  Eyes: Right eye exhibits no discharge. Left eye exhibits no discharge. Scleral icterus is present.  Neck: Normal range of motion.  Cardiovascular: Regular rhythm.  Tachycardia present.   Pulmonary/Chest: Effort normal.  Abdominal: Soft. Bowel sounds are normal. He exhibits no distension. There is no tenderness.  Genitourinary: Rectal exam shows no external hemorrhoid, no internal hemorrhoid and no tenderness. Guaiac positive stool. Prostate is not enlarged and not tender.  Neurological: He is alert.  Skin: Skin is warm. He is not diaphoretic. There is pallor.    ED Course  Procedures (including critical care time) Labs Review Labs Reviewed  CBC - Abnormal; Notable for the following:    RBC 3.34 (*)    Hemoglobin 4.9 (*)    HCT 19.3 (*)    MCV 57.8 (*)    MCH 14.7 (*)    MCHC 25.4 (*)    RDW 23.6 (*)    All other components within normal limits  BASIC METABOLIC PANEL - Abnormal; Notable for the following:    Sodium 132 (*)    Glucose, Bld 69 (*)    Calcium 8.3 (*)    Anion gap 16 (*)    All other components within normal limits  HEPATIC FUNCTION PANEL - Abnormal; Notable for the following:    Albumin 2.8 (*)    AST 44 (*)    Alkaline Phosphatase 135 (*)    All other components within normal limits  BRAIN NATRIURETIC PEPTIDE  PROTIME-INR  CBC WITH DIFFERENTIAL/PLATELET  CBC WITH DIFFERENTIAL/PLATELET  COMPREHENSIVE METABOLIC PANEL  VITAMIN V95  FOLATE  IRON AND TIBC  FERRITIN  RETICULOCYTES  URINE RAPID DRUG SCREEN ([place])  I-STAT TROPOININ, ED  I-STAT TROPOININ, ED  POC OCCULT BLOOD, ED  TYPE AND SCREEN  PREPARE RBC (CROSSMATCH)    Imaging Review No results found.   EKG  Interpretation   Date/Time:  Monday January 01 2015 19:41:09 EDT Ventricular Rate:  117 PR Interval:  140 QRS Duration: 84 QT Interval:  314 QTC Calculation: 438 R Axis:   87 Text Interpretation:  Sinus tachycardia with occasional Premature  ventricular complexes Biatrial enlargement Abnormal ECG No significant  change since last tracing Confirmed by Winfred Leeds  MD, SAM [phone]) on  01/01/2015 9:17:05 PM      MDM   Final diagnoses:  Weight loss  Anemia, unspecified anemia type  Gastrointestinal hemorrhage, unspecified gastritis, unspecified gastrointestinal hemorrhage type        Junius Creamer, NP 01/01/15 2227  Junius Creamer, NP 01/01/15 2228  Orlie Dakin, MD 01/02/15 8270

## 2015-01-02 ENCOUNTER — Inpatient Hospital Stay (HOSPITAL_COMMUNITY): Payer: BC Managed Care – PPO

## 2015-01-02 ENCOUNTER — Encounter (HOSPITAL_COMMUNITY): Payer: Self-pay

## 2015-01-02 DIAGNOSIS — D62 Acute posthemorrhagic anemia: Secondary | ICD-10-CM

## 2015-01-02 DIAGNOSIS — E43 Unspecified severe protein-calorie malnutrition: Secondary | ICD-10-CM | POA: Diagnosis present

## 2015-01-02 DIAGNOSIS — K922 Gastrointestinal hemorrhage, unspecified: Principal | ICD-10-CM

## 2015-01-02 DIAGNOSIS — R0600 Dyspnea, unspecified: Secondary | ICD-10-CM

## 2015-01-02 DIAGNOSIS — I472 Ventricular tachycardia: Secondary | ICD-10-CM

## 2015-01-02 LAB — GLUCOSE, CAPILLARY
Glucose-Capillary: 181 mg/dL — ABNORMAL HIGH (ref 70–99)
Glucose-Capillary: 150 mg/dL — ABNORMAL HIGH (ref 70–99)
Glucose-Capillary: 129 mg/dL — ABNORMAL HIGH (ref 70–99)

## 2015-01-02 LAB — CBC WITH DIFFERENTIAL/PLATELET
Basophils Absolute: 0.1 10*3/uL (ref 0.0–0.1)
Basophils Relative: 1 % (ref 0–1)
EOS ABS: 0 10*3/uL (ref 0.0–0.7)
Eosinophils Relative: 0 % (ref 0–5)
HEMATOCRIT: 17.7 % — AB (ref 39.0–52.0)
Hemoglobin: 4.5 g/dL — CL (ref 13.0–17.0)
Lymphocytes Relative: 19 % (ref 12–46)
Lymphs Abs: 1.7 10*3/uL (ref 0.7–4.0)
MCH: 14.7 pg — AB (ref 26.0–34.0)
MCHC: 25.4 g/dL — ABNORMAL LOW (ref 30.0–36.0)
MCV: 57.8 fL — ABNORMAL LOW (ref 78.0–100.0)
MONO ABS: 0.8 10*3/uL (ref 0.1–1.0)
Monocytes Relative: 9 % (ref 3–12)
NEUTROS PCT: 71 % (ref 43–77)
Neutro Abs: 6.6 10*3/uL (ref 1.7–7.7)
PLATELETS: 302 10*3/uL (ref 150–400)
RBC: 3.06 MIL/uL — AB (ref 4.22–5.81)
RDW: 23.5 % — ABNORMAL HIGH (ref 11.5–15.5)
WBC: 9.2 10*3/uL (ref 4.0–10.5)

## 2015-01-02 LAB — RETICULOCYTES
RBC.: 3.06 MIL/uL — AB (ref 4.22–5.81)
RETIC COUNT ABSOLUTE: 79.6 10*3/uL (ref 19.0–186.0)
Retic Ct Pct: 2.6 % (ref 0.4–3.1)

## 2015-01-02 LAB — COMPREHENSIVE METABOLIC PANEL
ALBUMIN: 2.5 g/dL — AB (ref 3.5–5.2)
ALT: 27 U/L (ref 0–53)
AST: 57 U/L — AB (ref 0–37)
Alkaline Phosphatase: 135 U/L — ABNORMAL HIGH (ref 39–117)
Anion gap: 18 — ABNORMAL HIGH (ref 5–15)
BUN: 6 mg/dL (ref 6–23)
CO2: 16 mmol/L — AB (ref 19–32)
CREATININE: 0.83 mg/dL (ref 0.50–1.35)
Calcium: 7.9 mg/dL — ABNORMAL LOW (ref 8.4–10.5)
Chloride: 96 mmol/L (ref 96–112)
GFR calc Af Amer: >90 mL/min (ref >90)
GFR calc non Af Amer: >90 mL/min (ref >90)
Glucose, Bld: 35 mg/dL — CL (ref 70–99)
POTASSIUM: 4 mmol/L (ref 3.5–5.1)
SODIUM: 130 mmol/L — AB (ref 135–145)
Total Bilirubin: 1.9 mg/dL — ABNORMAL HIGH (ref 0.3–1.2)
Total Protein: 6.1 g/dL (ref 6.0–8.3)

## 2015-01-02 LAB — FOLATE: Folate: 6 ng/mL

## 2015-01-02 LAB — RAPID URINE DRUG SCREEN, HOSP PERFORMED
AMPHETAMINES: NOT DETECTED
Barbiturates: NOT DETECTED
Benzodiazepines: NOT DETECTED
COCAINE: NOT DETECTED
OPIATES: NOT DETECTED
Tetrahydrocannabinol: POSITIVE — AB

## 2015-01-02 LAB — FERRITIN: Ferritin: 9 ng/mL — ABNORMAL LOW (ref 22–322)

## 2015-01-02 LAB — PREPARE RBC (CROSSMATCH)

## 2015-01-02 LAB — IRON AND TIBC
Iron: 12 ug/dL — ABNORMAL LOW (ref 42–165)
Saturation Ratios: 5 % — ABNORMAL LOW (ref 20–55)
TIBC: 257 ug/dL (ref 215–435)
UIBC: 245 ug/dL (ref 125–400)

## 2015-01-02 LAB — VITAMIN B12: Vitamin B-12: 605 pg/mL (ref 211–911)

## 2015-01-02 LAB — MAGNESIUM: Magnesium: 2 mg/dL (ref 1.5–2.5)

## 2015-01-02 MED ORDER — IOHEXOL 300 MG/ML  SOLN
80.0000 mL | Freq: Once | INTRAMUSCULAR | Status: AC | PRN
  Administered 2015-01-02: 80 mL via INTRAVENOUS

## 2015-01-02 MED ORDER — SODIUM CHLORIDE 0.9 % IV SOLN
Freq: Once | INTRAVENOUS | Status: AC
  Administered 2015-01-02: 06:00:00 via INTRAVENOUS

## 2015-01-02 MED ORDER — PANTOPRAZOLE SODIUM 40 MG IV SOLR
40.0000 mg | Freq: Two times a day (BID) | INTRAVENOUS | Status: DC
  Administered 2015-01-02 – 2015-01-03 (×3): 40 mg via INTRAVENOUS
  Filled 2015-01-02 (×5): qty 40

## 2015-01-02 MED ORDER — DEXTROSE-NACL 5-0.9 % IV SOLN
INTRAVENOUS | Status: DC
  Administered 2015-01-02 – 2015-01-03 (×2): via INTRAVENOUS

## 2015-01-02 MED ORDER — NICOTINE 21 MG/24HR TD PT24
21.0000 mg | MEDICATED_PATCH | Freq: Every day | TRANSDERMAL | Status: DC
  Administered 2015-01-02 – 2015-01-05 (×4): 21 mg via TRANSDERMAL
  Filled 2015-01-02 (×4): qty 1

## 2015-01-02 MED ORDER — SODIUM CHLORIDE 0.9 % IV SOLN
Freq: Once | INTRAVENOUS | Status: AC
  Administered 2015-01-02: 13:00:00 via INTRAVENOUS

## 2015-01-02 MED ORDER — ENSURE ENLIVE PO LIQD
237.0000 mL | Freq: Three times a day (TID) | ORAL | Status: DC
  Administered 2015-01-02 – 2015-01-05 (×5): 237 mL via ORAL

## 2015-01-02 MED ORDER — MAGNESIUM SULFATE 2 GM/50ML IV SOLN
2.0000 g | Freq: Once | INTRAVENOUS | Status: AC
  Administered 2015-01-02: 2 g via INTRAVENOUS
  Filled 2015-01-02: qty 50

## 2015-01-02 NOTE — Progress Notes (Signed)
INITIAL NUTRITION ASSESSMENT  DOCUMENTATION CODES Per approved criteria  -Severe  malnutrition in the context of social or environmental circumstances   Pt meets criteria for severe MALNUTRITION in the context of chronic illness as evidenced by severe fat and muscle depletion.  INTERVENTION: Ensure Enlive po TID, each supplement provides 350 kcal and 20 grams of protein  NUTRITION DIAGNOSIS: Malnutrition related to inadequate oral intake as evidenced by severe fat and muscle depletion.   Goal: Pt will meet >90% of estimated nutritional needs  Monitor:  Diet advancement, PO/supplement intake, labs, weight chnages, I/O's  Reason for Assessment: MST=3  59 y.o. male  Admitting Dx: <principal problem not specified>  This is a 59 y.o. year old male with significant past medical history of anemia, cachexia, ETOH abuse, tobacco abuse, aortic insufficiency presenting with GIB, anemia, dyspnea. Patient reports progressive shortness of breath over the past 2 or 3 weeks. Being unable to take 2-3 steps feeling significantly short of breath. No chest pains. Mild wheezing in the setting of tobacco abuse. Also reports at least 10-20 pound weight loss over the past 2-3 months. No fevers or chills. No night sweats. Has had recurrent episodes of emesis over the past 2-3 weeks. Nonbilious nonbloody. Unable to tolerate by mouth intake. Also with recurring episodes of diarrhea. Has noticed black stools over the past 2-3 days. Everyday drinker. Gentle ease 2-3 beers per day. Drinks very heavily on the weekend up to 12 beers daily.  ASSESSMENT: Pt admitted with anemia. He was being administered a blood transfusion at time of visit. He has a hx of ETOH abuse.  Pt reports poor appetite over the past month. He confirms that he has lost weight, however, is unable to quantify time frame or amount of weight loss. He reveals UBW 140-145#, but unable to determine the last time he weighed that amount. Per weight  records, pt weighed 199# approximately 2 years ago.  Pt reports that he consumed all of his full liquid diet and feels much better. He is requesting Ensure shakes for additional calories and protein. RD to order. Educated pt on importance of good meal and supplement intake to promote healing process.  Noted pt is also receiving MVI, folic acid, and folic acid supplements. Labs reviewed. Na: 130, CO2: 16, Calcium: 7.9, Glucose: 35.   Nutrition Focused Physical Exam:  Subcutaneous Fat:  Orbital Region: severe depletion Upper Arm Region: severe depletion Thoracic and Lumbar Region: severe depletion  Muscle:  Temple Region: severe depletion Clavicle Bone Region: severe depletion Clavicle and Acromion Bone Region: severe depletion Scapular Bone Region: severe depletion Dorsal Hand: severe depletion Patellar Region: severe depletion Anterior Thigh Region: severe depletion Posterior Calf Region: severe depletion  Edema: none present   Height: Ht Readings from Last 1 Encounters:  01/02/15 5\' 10"  (1.778 m)    Weight: Wt Readings from Last 1 Encounters:  01/02/15 97 lb 4.8 oz (44.135 kg)    Ideal Body Weight: 166#  % Ideal Body Weight: 58%  Wt Readings from Last 10 Encounters:  01/02/15 97 lb 4.8 oz (44.135 kg)  03/09/13 119 lb (53.978 kg)  02/02/13 120 lb (54.432 kg)  06/04/12 120 lb (54.432 kg)  10/24/11 123 lb 6.4 oz (55.974 kg)  10/04/11 114 lb 6.7 oz (51.9 kg)    Usual Body Weight: 120#  % Usual Body Weight: 81%  BMI:  Body mass index is 13.96 kg/(m^2). Underweight  Estimated Nutritional Needs: Kcal: 1500-1700 Protein: 65-75 grams Fluid: 1.5-1.7 L  Skin: generalized acne  Diet Order: Diet NPO time specified Diet full liquid Room service appropriate?: Yes; Fluid consistency:: Thin  EDUCATION NEEDS: -Education needs addressed   Intake/Output Summary (Last 24 hours) at 01/02/15 1420 Last data filed at 01/02/15 1409  Gross per 24 hour  Intake   1671 ml   Output    380 ml  Net   1291 ml    Last BM: 01/01/15  Labs:   Recent Labs Lab 01/01/15 1957 01/02/15 1020  NA 132* 130*  K 4.2 4.0  CL 97 96  CO2 19 16*  BUN 6 6  CREATININE 0.68 0.83  CALCIUM 8.3* 7.9*  GLUCOSE 69* 35*    CBG (last 3)  No results for input(s): GLUCAP in the last 72 hours.  Scheduled Meds: . folic acid  1 mg Oral Daily  . multivitamin with minerals  1 tablet Oral Daily  . nicotine  21 mg Transdermal Daily  . pantoprazole (PROTONIX) IV  40 mg Intravenous Q12H  . banana bag IV 1000 mL   Intravenous Once  . thiamine  100 mg Oral Daily   Or  . thiamine  100 mg Intravenous Daily    Continuous Infusions: . sodium chloride 100 mL/hr at 01/02/15 0300    Past Medical History  Diagnosis Date  . GERD (gastroesophageal reflux disease)   . Dysphagia   . Alcohol abuse   . Anemia due to GI blood loss 09/2011    microcytic.  transfused for Hgb 3.5, MCV in 50s.   . Benign neoplasm of colon   . Cachexia   . Edema   . ILD (interstitial lung disease)   . Iron deficiency anemia, unspecified   . Pulmonary nodule   . Reflux esophagitis   . Tobacco abuse   . Weight loss, abnormal   . Smoker   . ED (erectile dysfunction)   . Aortic insufficiency     Past Surgical History  Procedure Laterality Date  . Esophagogastroduodenoscopy  10/02/2011    Procedure: ESOPHAGOGASTRODUODENOSCOPY (EGD);  Surgeon: Scarlette Shorts, MD;  Location[facility] ENDOSCOPY;  Service: Endoscopy;  Laterality: N/A;  . Colonoscopy  10/03/2011    Procedure: COLONOSCOPY;  Surgeon: Scarlette Shorts, MD;  Location: [place];  Service: Endoscopy;  Laterality: N/A;    Rahma Meller A. Jimmye Norman, RD, LDN, CDE Pager: [phone] After hours Pager: [phone]

## 2015-01-02 NOTE — Progress Notes (Signed)
Re-paged Dr. Ernestina Patches regarding blood transfusion and hgb 4.5, orders received and noted.

## 2015-01-02 NOTE — Progress Notes (Signed)
CRITICAL VALUE ALERT  Critical value received:  CBG  Date of notification:  01/02/15  Time of notification:  6151  Critical value read back:Yes.    Nurse who received alert:  Riccardo Dubin, RN  MD notified (1st page):  Dr Clementeen Graham  Time of first page:  1336  MD notified (2nd page):  Time of second page:  Responding MD:    Time MD responded:

## 2015-01-02 NOTE — Progress Notes (Addendum)
[facility] PROGRESS NOTE  James [name] [facility] IWP:[id number] DOB: [birth year] DOA: 01/01/2015 PCP: Wyatt Haste, MD   brief narrative 59 year old male wit history of anemia, alcohol abuse, tobacco abuse, aortic insufficiency who presented with progressive shortness of breath for the past 2-3 weeks with significant weight loss of about 20 pounds for past 3 months nonbloody emesis for the past 2 weeks and black stools for past few days. Patient severely anemic with hb of 4.5.  Placed on protonix drip. GI following.     Assessment/Plan: Severe anemia due to GI bleed S/p 2 u prbc on admission with hb still 6.5. Ordered another 2 U PRBC. Monitor h&H closely.  Seen by GI with plan on EGD tomorrow afternoon. Colonoscopy after bowel prep subsequently. Protonix IV twice a day. Gastric wall thickening on CT abdomen and significant restenosis concerning for malignancy.  Insterstitial lung ds Follows with Dr Chase Caller.    etoh and tobacco abuse Counseled on cessation. Nicotine patch. Place on CIWA. Added thiamine , folate and  multivitamin.  Severe protein calorie malnutrition Nutrition consult    Non intentional weight loss  check CA 125 and CEA. Check acute hepatitis panel given jaundice and elevated ALKPand HIV ab   NSVT Check 12 lead EKG. Ordered magnesium sulfate. 2d echo ordered on admission for dyspnea. Monitor on tele   Hypoglycemia possibly due to being NPO. asymptmoatic switch fluid  to D5 NS   Hyponatremia and low bicarb  monitor with fluids  Diet: full liquid, nothing by mouth after midnight  DVT prophylaxis: SCDs   Code Status: full code Family Communication: none at bedside Disposition Plan: currently inpatient on tele   Consultants:  [facility]  Procedures:  CT chest and abdomen  2D echo  Antibiotics: None  HPI/Subjective: Seen and examined. No further vomiting or dark stool. Admission H&P reviewed  Objective: Filed Vitals:   01/02/15  1504  BP: 113/63  Pulse: 81  Temp: 97.8 F (36.6 C)  Resp: 18    Intake/Output Summary (Last 24 hours) at 01/02/15 1547 Last data filed at 01/02/15 1504  Gross per 24 hour  Intake   2006 ml  Output    380 ml  Net   1626 ml   Filed Weights   01/01/15 1942 01/02/15 0020  Weight: 41.958 kg (92 lb 8 oz) 44.135 kg (97 lb 4.8 oz)    Exam:   General:  Middle aged Male in no acute distress  HEENT: Pallor present moist oral mucosa, no icterus, supple neck, no cervical or cervical lymph nodes  Chest: Clear to auscultation bilaterally, no added sounds  CVS: Normal S1 and S2, no murmurs rub or gallop  GI: soft, NT, ND, BS+  Musculoskeletal: warm, no edema  CNS: alert and oriented, no tremors   Data Reviewed: Basic Metabolic Panel:  Recent Labs Lab 01/01/15 1957 01/02/15 1020  NA 132* 130*  K 4.2 4.0  CL 97 96  CO2 19 16*  GLUCOSE 69* 35*  BUN 6 6  CREATININE 0.68 0.83  CALCIUM 8.3* 7.9*   Liver Function Tests:  Recent Labs Lab 01/01/15 2108 01/02/15 1020  AST 44* 57*  ALT 25 27  ALKPHOS 135* 135*  BILITOT 0.8 1.9*  PROT 6.9 6.1  ALBUMIN 2.8* 2.5*   No results for input(s): LIPASE, AMYLASE in the last 168 hours. No results for input(s): AMMONIA in the last 168 hours. CBC:  Recent Labs Lab 01/01/15 1957 01/01/15 2304 01/02/15 1020  WBC 9.6 9.2 10.3  NEUTROABS  --  6.6 8.3*  HGB 4.9* 4.5* 6.5*  HCT 19.3* 17.7* 23.2*  MCV 57.8* 57.8* 66.3*  PLT 301 302 242   Cardiac Enzymes: No results for input(s): CKTOTAL, CKMB, CKMBINDEX, TROPONINI in the last 168 hours. BNP (last 3 results)  Recent Labs  01/01/15 1957  BNP 20.4    ProBNP (last 3 results) No results for input(s): PROBNP in the last 8760 hours.  CBG:  Recent Labs Lab 01/02/15 1512  GLUCAP 181*    No results found for this or any previous visit (from the past 240 hour(s)).   Studies: Ct Chest W Contrast  01/02/2015   CLINICAL DATA:  Acute onset of shortness of breath and  difficulty swallowing. Weight loss. Initial encounter.  EXAM: CT CHEST, ABDOMEN, AND PELVIS WITH CONTRAST  TECHNIQUE: Multidetector CT imaging of the chest, abdomen and pelvis was performed following the standard protocol during bolus administration of intravenous contrast.  CONTRAST:  7mL OMNIPAQUE IOHEXOL 300 MG/ML  SOLN  COMPARISON:  CTA of the chest performed 10/01/2011  FINDINGS: CT CHEST FINDINGS  The previously noted opacity at the right lung base appears to have reflected an impacted bronchiole. This has since resolved. The lungs are clear bilaterally. No focal consolidation, pleural effusion or pneumothorax is seen. No masses are identified.  The mediastinum is unremarkable in appearance. No mediastinal lymphadenopathy is seen. Diffuse coronary artery calcifications are noted. No pericardial effusion is identified. The ascending thoracic aorta is borderline normal in caliber. The great vessels are grossly unremarkable in appearance.  The thyroid gland is unremarkable. No axillary lymphadenopathy is seen.  No acute osseous abnormalities are identified.  CT ABDOMEN AND PELVIS FINDINGS  There is somewhat unusual marked gastric wall thickening along the greater curvature of the stomach. This may reflect some degree of underlying acute gastritis, though malignancy is also a concern. Endoscopy would be helpful for further evaluation, as deemed clinically appropriate.  Somewhat prominent surrounding gastric vessels are noted.  The liver and spleen are unremarkable in appearance. The gallbladder is within normal limits. The pancreas and adrenal glands are unremarkable.  The kidneys are unremarkable in appearance. There is no evidence of hydronephrosis. No renal or ureteral stones are seen. No perinephric stranding is appreciated.  No free fluid is identified. The small bowel is unremarkable in appearance. The stomach is within normal limits. No acute vascular abnormalities are seen. Scattered calcification is  noted along the abdominal aorta and its branches.  The appendix is not definitely characterized; there is no evidence of appendicitis. The colon is grossly unremarkable in appearance. Trace stranding along the descending colon is nonspecific and likely chronic in nature.  The bladder is mildly distended and grossly unremarkable. The prostate remains normal in size, with scattered calcification. No inguinal lymphadenopathy is seen.  No acute osseous abnormalities are identified.  IMPRESSION: 1. Somewhat unusual marked gastric wall thickening along the greater curvature of the stomach. This may reflect underlying acute gastritis, though malignancy is also a concern. Endoscopy would be helpful for further evaluation, as deemed clinically appropriate. 2. Lungs clear bilaterally. 3. Diffuse coronary artery calcifications seen. 4. Scattered calcification along the abdominal aorta and its branches. 5. Colon grossly unremarkable in appearance.   Electronically Signed   By: Garald Balding M.D.   On: 01/02/2015 03:21   Ct Abdomen Pelvis W Contrast  01/02/2015   CLINICAL DATA:  Acute onset of shortness of breath and difficulty swallowing. Weight loss. Initial encounter.  EXAM: CT CHEST, ABDOMEN, AND PELVIS WITH CONTRAST  TECHNIQUE:  Multidetector CT imaging of the chest, abdomen and pelvis was performed following the standard protocol during bolus administration of intravenous contrast.  CONTRAST:  1mL OMNIPAQUE IOHEXOL 300 MG/ML  SOLN  COMPARISON:  CTA of the chest performed 10/01/2011  FINDINGS: CT CHEST FINDINGS  The previously noted opacity at the right lung base appears to have reflected an impacted bronchiole. This has since resolved. The lungs are clear bilaterally. No focal consolidation, pleural effusion or pneumothorax is seen. No masses are identified.  The mediastinum is unremarkable in appearance. No mediastinal lymphadenopathy is seen. Diffuse coronary artery calcifications are noted. No pericardial effusion  is identified. The ascending thoracic aorta is borderline normal in caliber. The great vessels are grossly unremarkable in appearance.  The thyroid gland is unremarkable. No axillary lymphadenopathy is seen.  No acute osseous abnormalities are identified.  CT ABDOMEN AND PELVIS FINDINGS  There is somewhat unusual marked gastric wall thickening along the greater curvature of the stomach. This may reflect some degree of underlying acute gastritis, though malignancy is also a concern. Endoscopy would be helpful for further evaluation, as deemed clinically appropriate.  Somewhat prominent surrounding gastric vessels are noted.  The liver and spleen are unremarkable in appearance. The gallbladder is within normal limits. The pancreas and adrenal glands are unremarkable.  The kidneys are unremarkable in appearance. There is no evidence of hydronephrosis. No renal or ureteral stones are seen. No perinephric stranding is appreciated.  No free fluid is identified. The small bowel is unremarkable in appearance. The stomach is within normal limits. No acute vascular abnormalities are seen. Scattered calcification is noted along the abdominal aorta and its branches.  The appendix is not definitely characterized; there is no evidence of appendicitis. The colon is grossly unremarkable in appearance. Trace stranding along the descending colon is nonspecific and likely chronic in nature.  The bladder is mildly distended and grossly unremarkable. The prostate remains normal in size, with scattered calcification. No inguinal lymphadenopathy is seen.  No acute osseous abnormalities are identified.  IMPRESSION: 1. Somewhat unusual marked gastric wall thickening along the greater curvature of the stomach. This may reflect underlying acute gastritis, though malignancy is also a concern. Endoscopy would be helpful for further evaluation, as deemed clinically appropriate. 2. Lungs clear bilaterally. 3. Diffuse coronary artery  calcifications seen. 4. Scattered calcification along the abdominal aorta and its branches. 5. Colon grossly unremarkable in appearance.   Electronically Signed   By: Garald Balding M.D.   On: 01/02/2015 03:21    Scheduled Meds: . folic acid  1 mg Oral Daily  . magnesium sulfate 1 - 4 g bolus IVPB  2 g Intravenous Once  . multivitamin with minerals  1 tablet Oral Daily  . nicotine  21 mg Transdermal Daily  . pantoprazole (PROTONIX) IV  40 mg Intravenous Q12H  . banana bag IV 1000 mL   Intravenous Once  . thiamine  100 mg Oral Daily   Or  . thiamine  100 mg Intravenous Daily   Continuous Infusions: . sodium chloride 100 mL/hr at 01/02/15 0300      Time spent: 35 minutes    Ekin Pilar, [facility] Pager [phone] If 7PM-7AM, please contact night-coverage at [email or web address], password [email or web address] 01/02/2015, 3:47 PM  LOS: 1 day

## 2015-01-02 NOTE — Progress Notes (Signed)
[facility] Consult: 11:17 AM 01/02/2015  LOS: 1 day    Referring Provider: Dr Clementeen Graham  Primary Care Physician:  Wyatt Haste, MD Primary Gastroenterologist:  Dr. Henrene Pastor     Reason for Consultation:  GI bleed   HPI: James [name] is a 59 y.o. male.  Heavy drinker, smoker. Work up for microcytic anemia in 09/2011 when Hgb 3.5, MCV in 50s.  10/03/2011 Colonoscopy 1) Three large polyps in the mid transverse colon (TA without HGD) and rectum (tubular adenoma with HGD) removed 2) Moderate diverticulosis found scattered throught the colon 3) Internal hemorrhoids Suggested follow up surveillance colonoscopy was 3 years/Jan 2016.  10/02/2011 EGD 1) Esophagitis in the distal esophagus.  2) Stricture in the distal esophagus.  3) Otherwise normal esophagus.  4) Normal stomach.  5) Normal duodenum  Presented to ED with 2 to 3 weeks of progressive DOE, to the point of profound SOB, tachycardia  but no chest pain when walking 2 to 3 steps.  Recurrent emesis for 3 weeks, non-bloody.  Describes food feeling like it sticks in lower esophageal region.  Last week had 2 loose, black stools but otherwise brown BMs. ETOH of 2 -3 beers daily, 12 pack plus ~ 1 pint of gin over a weekend.  Smokes pot but no use currently or in past of cocaine, IVD etc. Still smoking cigarettes 1ppd.  10 to 20 # weight loss over 3 months.  No NSAIDs, ASA.  No GI protective meds.   Hgb of 4.9, last Hgb was 09/2011 when Hgb of 10.9 following multiple PRBCs.  MCV 57.  Coags normal.  BUN not elevated.  AST 44, alk phos 135 otherwise LFTs normal.  Hgb is 6.5 after 2 units PRBCs. CT chest/abdomen/pelvis: thickened gastric wall, coronary artery and abdominal aorta calcifications, grossly normal colon.  Admitted    Past Medical History  Diagnosis Date   . GERD (gastroesophageal reflux disease)   . Dysphagia   . Alcohol abuse   . Anemia due to GI blood loss 09/2011    microcytic.  transfused for Hgb 3.5, MCV in 50s.   . Benign neoplasm of colon   . Cachexia   . Edema   . ILD (interstitial lung disease)   . Iron deficiency anemia, unspecified   . Pulmonary nodule   . Reflux esophagitis   . Tobacco abuse   . Weight loss, abnormal   . Smoker   . ED (erectile dysfunction)   . Aortic insufficiency     Past Surgical History  Procedure Laterality Date  . Esophagogastroduodenoscopy  10/02/2011    Procedure: ESOPHAGOGASTRODUODENOSCOPY (EGD);  Surgeon: Scarlette Shorts, MD;  Location: [facility] ENDOSCOPY;  Service: Endoscopy;  Laterality: N/A;  . Colonoscopy  10/03/2011    Procedure: COLONOSCOPY;  Surgeon: Scarlette Shorts, MD;  Location: [facility];  Service: Endoscopy;  Laterality: N/A;    Prior to Admission medications   Medication Sig Start Date End Date Taking? Authorizing Provider  Avanafil (STENDRA) 100 MG TABS Take 100 capsules by mouth 1 day or 1 dose. 02/02/13   Elyse Jarvis  [employer], MD    Scheduled Meds: . folic acid  1 mg Oral Daily  . multivitamin with minerals  1 tablet Oral Daily  . [START ON 01/05/2015] pantoprazole (PROTONIX) IV  40 mg Intravenous Q12H  . banana bag IV 1000 mL   Intravenous Once  . thiamine  100 mg Oral Daily   Or  . thiamine  100 mg Intravenous Daily   Infusions: . sodium chloride 100 mL/hr at 01/02/15 0300  . pantoprozole (PROTONIX) infusion 8 mg/hr (01/01/15 2337)   PRN Meds: LORazepam **OR** LORazepam   Allergies as of 01/01/2015  . (No Known Allergies)    Family History  Problem Relation Age of Onset  . Hypertension Mother   . Diabetes Maternal Grandfather     History   Social History  . Marital Status: Married    Spouse Name: N/A  . Number of Children: N/A  . Years of Education: N/A   Occupational History  . unemployed    Social History Main Topics  . Smoking status: Current Every Day Smoker  -- 1.00 packs/day for 20 years    Types: Cigarettes  . Smokeless tobacco: Never Used     Comment: pt states he is down to 7 cigs per day  . Alcohol Use: 1.2 oz/week    2 Cans of beer per week  . Drug Use: Yes    Special: Marijuana  . Sexual Activity: Not Currently   Other Topics Concern  . Not on file   Social History Narrative    REVIEW OF SYSTEMS: Constitutional:  Per HPI ENT:  No nose bleeds Pulm:  Per HPI CV:  No LE edema. No chest pain.  Tachycardia with exertion. GU:  No hematuria, no frequency.  No oliguria. No tea-colored urine. GI:  Per HPI Heme:  Per HPI   Transfusions:  Per HPI Neuro:  No headaches, no peripheral tingling or numbness Derm:  No itching, no rash or sores.  Endocrine:  No sweats or chills.  No polyuria or dysuria Immunization:  None in last few years. Last recorded tetanus was 6/20o2.  Travel:  None beyond local counties in last few months.    PHYSICAL EXAM: Vital signs in last 24 hours: Filed Vitals:   01/02/15 0855  BP: 108/61  Pulse: 77  Temp: 97.9 F (36.6 C)  Resp: 19   Wt Readings from Last 3 Encounters:  01/02/15 97 lb 4.8 oz (44.135 kg)  03/09/13 119 lb (53.978 kg)  02/02/13 120 lb (54.432 kg)   General: Pleasant, comfortable, cachectic and generally ill appearing AAM. He looks older than stated age. Head:  No signs of head trauma. No facial asymmetry or swelling.  Eyes:  No scleral icterus, conjunctiva is pale. Ears:  Not hard of hearing.  Nose:  No congestion or nasal discharge Mouth:  Clear, moist.  No sores. Neck:  No JVD. No masses. No TMG. Lungs:  No dyspnea, no cough. Lungs clear to auscultation and percussion bilaterally. Heart: RRR. No MRG. S1/S2 audible Abdomen:  Soft, thin. No organomegaly. Active bowel sounds. No masses, no tenderness. No hernias or bruits..   Rectal: Deferred. Stool specimen submitted to lab is FOBT positive.   Musc/Skeltl: No joint contractures, swelling or gross deformity. Extremities:  No  pedal edema. Feet are warm  Neurologic:  Oriented 3. Appropriate. Provides a good history. Moves all 4 limbs. Limb strength not diminished. No tremor, no asterixis. Skin:  No rash, no sores. Nodes:  No cervical or inguinal adenopathy.  Psych:  Pleasant, cooperative. Affect somewhat flat but patient fully engaged in dialogue.  Intake/Output from previous day: 04/25 0701 - 04/26 0700 In: 970 [P.O.:480; Blood:490] Out: 80 [Urine:80] Intake/Output this shift: Total I/O In: 479 [P.O.:240; Blood:239] Out: 300 [Urine:300]  LAB RESULTS:  Recent Labs  01/01/15 1957 01/01/15 2304 01/02/15 1020  WBC 9.6 9.2 10.3  HGB 4.9* 4.5* 6.5*  HCT 19.3* 17.7* 23.2*  PLT 301 302 PENDING   BMET Lab Results  Component Value Date   NA 132* 01/01/2015   NA 134* 10/05/2011   NA 135 10/04/2011   K 4.2 01/01/2015   K 4.9 10/05/2011   K 4.1 10/04/2011   CL 97 01/01/2015   CL 106 10/05/2011   CL 108 10/04/2011   CO2 19 01/01/2015   CO2 24 10/05/2011   CO2 18* 10/04/2011   GLUCOSE 69* 01/01/2015   GLUCOSE 94 10/05/2011   GLUCOSE 101* 10/04/2011   BUN 6 01/01/2015   BUN 5* 10/05/2011   BUN 2* 10/04/2011   CREATININE 0.68 01/01/2015   CREATININE 0.55 10/05/2011   CREATININE 0.40* 10/04/2011   CALCIUM 8.3* 01/01/2015   CALCIUM 8.6 10/05/2011   CALCIUM 8.0* 10/04/2011   LFT  Recent Labs  01/01/15 2108  PROT 6.9  ALBUMIN 2.8*  AST 44*  ALT 25  ALKPHOS 135*  BILITOT 0.8  BILIDIR 0.2  IBILI 0.6   PT/INR Lab Results  Component Value Date   INR 1.02 01/01/2015   INR 1.18 10/02/2011   Lipase  No results found for: LIPASE  Drugs of Abuse     Component Value Date/Time   LABOPIA NONE DETECTED 01/01/2015 2300   COCAINSCRNUR NONE DETECTED 01/01/2015 2300   LABBENZ NONE DETECTED 01/01/2015 2300   AMPHETMU NONE DETECTED 01/01/2015 2300   THCU POSITIVE* 01/01/2015 2300   LABBARB NONE DETECTED 01/01/2015 2300     RADIOLOGY STUDIES: Ct Chest W Contrast Ct Abdomen Pelvis W  Contrast 01/02/2015  COMPARISON:  CTA of the chest performed 10/01/2011  FINDINGS: CT CHEST FINDINGS  The previously noted opacity at the right lung base appears to have reflected an impacted bronchiole. This has since resolved. The lungs are clear bilaterally. No focal consolidation, pleural effusion or pneumothorax is seen. No masses are identified.  The mediastinum is unremarkable in appearance. No mediastinal lymphadenopathy is seen. Diffuse coronary artery calcifications are noted. No pericardial effusion is identified. The ascending thoracic aorta is borderline normal in caliber. The great vessels are grossly unremarkable in appearance.  The thyroid gland is unremarkable. No axillary lymphadenopathy is seen.  No acute osseous abnormalities are identified.  CT ABDOMEN AND PELVIS FINDINGS  There is somewhat unusual marked gastric wall thickening along the greater curvature of the stomach. This may reflect some degree of underlying acute gastritis, though malignancy is also a concern. Endoscopy would be helpful for further evaluation, as deemed clinically appropriate.  Somewhat prominent surrounding gastric vessels are noted.  The liver and spleen are unremarkable in appearance. The gallbladder is within normal limits. The pancreas and adrenal glands are unremarkable.  The kidneys are unremarkable in appearance. There is no evidence of hydronephrosis. No renal or ureteral stones are seen. No perinephric stranding is appreciated.  No free fluid is identified. The small bowel is unremarkable in appearance. The stomach is within normal limits. No acute vascular abnormalities are seen. Scattered calcification is noted along the abdominal aorta and its branches.  The appendix is not definitely characterized; there is no evidence of appendicitis. The colon is  grossly unremarkable in appearance. Trace stranding along the descending colon is nonspecific and likely chronic in nature.  The bladder is mildly distended and  grossly unremarkable. The prostate remains normal in size, with scattered calcification. No inguinal lymphadenopathy is seen.  No acute osseous abnormalities are identified.  IMPRESSION: 1. Somewhat unusual marked gastric wall thickening along the greater curvature of the stomach. This may reflect underlying acute gastritis, though malignancy is also a concern. Endoscopy would be helpful for further evaluation, as deemed clinically appropriate. 2. Lungs clear bilaterally. 3. Diffuse coronary artery calcifications seen. 4. Scattered calcification along the abdominal aorta and its branches. 5. Colon grossly unremarkable in appearance.   Electronically Signed   By: Garald Balding M.D.   On: 01/02/2015 03:21    ENDOSCOPIC STUDIES: Per HPI  IMPRESSION:   *  Weight loss, n/v, recurrent microcytic anemia, FOBT + with dark stools last week. Suspect slow, chronic GI blood loss with possible accelerated losses in recent weeks.  Appropriate response to 2 units PRBCs.     Hx colon tubular adenomatous polyps, one with HGD, 09/2011 Hx esophagitis and distal esophageal stricture 09/2011.  Currently c/o dysphagia.   Gastric thickening by CT scan.  Empiric Protonix drip in place, not convinced he needs drip but will leave in place for now.   *  Alcoholism.  Minor elevation of AST and ALK phos.  CT appearance of liver, pancreas, biliary tree unremarkable.   *  Hyponatremia.    PLAN:     *  EGD with MAC is set for 1400 for 4/27.  May need esophageal dilatation.  Can trial full liquids for now.  Plan surveillance colonoscopy when nausea/vomiting subsides and he can complete bowel prep.   *  Hgb/hct BID. 2 more units of blood has been ordered to transfuse.   *  Changed Protonix to IV BID, let current drip complete   Azucena Freed  01/02/2015, 11:17 AM Pager: [phone]

## 2015-01-02 NOTE — Progress Notes (Signed)
Pt had a 10-beat run of V-tach on tele this afternoon. 12-lead and Magnesium ordered and administered.

## 2015-01-02 NOTE — Progress Notes (Signed)
CRITICAL VALUE ALERT  Critical value received:  hgb 6.5  Date of notification:  01/02/2015  Time of notification:  1108  Critical value read back:Yes.    Nurse who received alert:  Esaw Dace  MD notified (1st page):  Dr. Clementeen Graham  Time of first page:  1116  MD notified (2nd page):  Time of second page:  Responding MD:  [email or web address]  Time MD responded:  0768, pgd

## 2015-01-02 NOTE — ED Notes (Signed)
Per Santiago Glad, RN, okay for NT to take pt up with blood transfusing.  Per Bevelyn Ngo, RN, Santiago Glad approved pt going up before CT done.

## 2015-01-02 NOTE — Progress Notes (Signed)
Covering physician paged  via  [place] regarding blood transfusion orders, Dr. Baltazar Najjar returned paged, to notify Dr. Ernestina Patches. Dr. Ernestina Patches paged+

## 2015-01-02 NOTE — Progress Notes (Signed)
Utilization Review completed. Ioana Louks RN BSN CM 

## 2015-01-02 NOTE — Care Management Note (Signed)
    Page 1 of 1   01/08/2015     5:57:43 PM CARE MANAGEMENT NOTE 01/08/2015  Patient:  James [name], James [name]   Account Number:  [id number]  Date Initiated:  01/02/2015  Documentation initiated by:  Carles Collet  Subjective/Objective Assessment:   severe anemia, recieving blood, GI consult     Action/Plan:   will follow for home needs   Anticipated DC Date:  01/05/2015   Anticipated DC Plan:  [place]  CM consult      Choice offered to / List presented to:             Status of service:  Completed, signed off Medicare Important Message given?  NO (If response is "NO", the following Medicare IM given date fields will be blank) Date Medicare IM given:   Medicare IM given by:   Date Additional Medicare IM given:   Additional Medicare IM given by:    Discharge Disposition:  HOME/SELF CARE  Per UR Regulation:  Reviewed for med. necessity/level of care/duration of stay  If discussed at [place] of Stay Meetings, dates discussed:    Comments:  01-02-15 Will follow for ome needs. Carles Collet RN BSN CM

## 2015-01-02 NOTE — Progress Notes (Signed)
Echocardiogram 2D Echocardiogram has been performed.  James [name] 01/02/2015, 3:36 PM

## 2015-01-02 NOTE — Progress Notes (Signed)
Pt to 5w rm 38 via stretcher, a/o x3, skin w/ acne through out. PRBC to RT hand infusing w/o difficulty. Pt oriented to unit, rm, call light system, and poc, expressing understanding. Family at bedside. Assessment completed

## 2015-01-03 ENCOUNTER — Inpatient Hospital Stay (HOSPITAL_COMMUNITY): Payer: BC Managed Care – PPO | Admitting: Anesthesiology

## 2015-01-03 ENCOUNTER — Encounter (HOSPITAL_COMMUNITY): Payer: Self-pay

## 2015-01-03 ENCOUNTER — Encounter (HOSPITAL_COMMUNITY)
Admission: EM | Disposition: A | Discharge status: Home / Self Care | Payer: Self-pay | Source: Home / Self Care | Attending: Internal Medicine

## 2015-01-03 DIAGNOSIS — R1314 Dysphagia, pharyngoesophageal phase: Secondary | ICD-10-CM

## 2015-01-03 DIAGNOSIS — R079 Chest pain, unspecified: Secondary | ICD-10-CM

## 2015-01-03 DIAGNOSIS — R0600 Dyspnea, unspecified: Secondary | ICD-10-CM

## 2015-01-03 DIAGNOSIS — D500 Iron deficiency anemia secondary to blood loss (chronic): Secondary | ICD-10-CM

## 2015-01-03 DIAGNOSIS — F1010 Alcohol abuse, uncomplicated: Secondary | ICD-10-CM

## 2015-01-03 DIAGNOSIS — Z720 Tobacco use: Secondary | ICD-10-CM

## 2015-01-03 DIAGNOSIS — R634 Abnormal weight loss: Secondary | ICD-10-CM

## 2015-01-03 DIAGNOSIS — E43 Unspecified severe protein-calorie malnutrition: Secondary | ICD-10-CM

## 2015-01-03 LAB — TYPE AND SCREEN
ABO/RH(D): O POS
Antibody Screen: NEGATIVE
UNIT DIVISION: 0
Unit division: 0
Unit division: 0
Unit division: 0

## 2015-01-03 LAB — COMPREHENSIVE METABOLIC PANEL
ALT: 26 U/L (ref 0–53)
ANION GAP: 9 (ref 5–15)
AST: 43 U/L — ABNORMAL HIGH (ref 0–37)
Albumin: 2.2 g/dL — ABNORMAL LOW (ref 3.5–5.2)
Alkaline Phosphatase: 131 U/L — ABNORMAL HIGH (ref 39–117)
BUN: 5 mg/dL — AB (ref 6–23)
CALCIUM: 7.6 mg/dL — AB (ref 8.4–10.5)
CO2: 21 mmol/L (ref 19–32)
Chloride: 98 mmol/L (ref 96–112)
Creatinine, Ser: 0.58 mg/dL (ref 0.50–1.35)
GFR calc non Af Amer: >90 mL/min (ref >90)
Glucose, Bld: 90 mg/dL (ref 70–99)
Potassium: 3.6 mmol/L (ref 3.5–5.1)
Sodium: 128 mmol/L — ABNORMAL LOW (ref 135–145)
TOTAL PROTEIN: 5.2 g/dL — AB (ref 6.0–8.3)
Total Bilirubin: 1.2 mg/dL (ref 0.3–1.2)

## 2015-01-03 LAB — HEMOGLOBIN AND HEMATOCRIT, BLOOD
HCT: 32.6 % — ABNORMAL LOW (ref 39.0–52.0)
HEMATOCRIT: 31.1 % — AB (ref 39.0–52.0)
HEMOGLOBIN: 9.6 g/dL — AB (ref 13.0–17.0)
Hemoglobin: 10.1 g/dL — ABNORMAL LOW (ref 13.0–17.0)

## 2015-01-03 LAB — GLUCOSE, CAPILLARY
GLUCOSE-CAPILLARY: 92 mg/dL (ref 70–99)
Glucose-Capillary: 112 mg/dL — ABNORMAL HIGH (ref 70–99)
Glucose-Capillary: 130 mg/dL — ABNORMAL HIGH (ref 70–99)

## 2015-01-03 LAB — HEPATITIS PANEL, ACUTE
HCV Ab: NEGATIVE
HEP A IGM: NONREACTIVE
Hep B C IgM: NONREACTIVE
Hepatitis B Surface Ag: NEGATIVE

## 2015-01-03 LAB — CA 125: CA 125: 2.9 U/mL (ref 0.0–34.0)

## 2015-01-03 LAB — HIV ANTIBODY (ROUTINE TESTING W REFLEX): HIV SCREEN 4TH GENERATION: NONREACTIVE

## 2015-01-03 LAB — CEA: CEA: 4.2 ng/mL (ref 0.0–4.7)

## 2015-01-03 LAB — TROPONIN I: Troponin I: 0.04 ng/mL — ABNORMAL HIGH (ref <0.031)

## 2015-01-03 SURGERY — CANCELLED PROCEDURE

## 2015-01-03 MED ORDER — LACTATED RINGERS IV SOLN: INTRAVENOUS | Status: DC

## 2015-01-03 MED ORDER — SODIUM CHLORIDE 0.9 % IV SOLN
INTRAVENOUS | Status: DC
  Administered 2015-01-03: 14:00:00 via INTRAVENOUS

## 2015-01-03 NOTE — Progress Notes (Signed)
I spoke with Dr. Glennon Mac.  She elicited a history of exertional chest pain.  Because of concern for possible cardiac ischemia we have decided to postpone upper endoscopy and will ask cardiology to evaluate the patient.

## 2015-01-03 NOTE — Interval H&P Note (Signed)
History and Physical Interval Note:  01/03/2015 1:54 PM  James [name]  has presented today for surgery, with the diagnosis of Profound microcytic anemia. FOBT positive stools.  The various methods of treatment have been discussed with the patient and family. After consideration of risks, benefits and other options for treatment, the patient has consented to  Procedure(s): ESOPHAGOGASTRODUODENOSCOPY (EGD) (N/A) SAVORY DILATION (N/A) BALLOON DILATION (N/A) as a surgical intervention .  The patient's history has been reviewed, patient examined, no change in status, stable for surgery.  I have reviewed the patient's chart and labs.  Questions were answered to the patient's satisfaction.    The recent H&P (dated *01/02/15**) was reviewed, the patient was examined and there is no change in the patients condition since that H&P was completed.   Erskine Emery  01/03/2015, 1:55 PM    Erskine Emery

## 2015-01-03 NOTE — Progress Notes (Signed)
[facility] PROGRESS NOTE  Cully [place]ow [facility] ZOX:[id number] DOB: 1956-03-09 DOA: 01/01/2015 PCP: Wyatt Haste, MD  Assessment/Plan: 1. Acute blood loss anemia 1. GI consulted 2. Ultimately plans for endoscopy after cardiac clearance (see below) 3. Hemoglobin thus far stable at just under 10 4. Follow h/h 2. Chest pain 1. Likely secondary to demand mismatch 2. Cardiology was consulted 3. Plans for either cardiac CT vs stress test pending results of AM H/H 3. Interstitial lung disease 1. Stable 2. Followed by Pulmonary as outpatient 4. ETOH abuse and tobacco abuse 1. On CIWA 2. Nicotine patch 5. Severe protein calorie malnutrition 1. Nutrition consulted 6. Unintentional wt loss 1. CEA and CA-125 both unremarkable 7. Hyponatremia 1. Stable 2. Cont to monitor 8. DVT prophylaxis 1. SCD's  Code Status: Full Family Communication: Pt in room (indicate person spoken with, relationship, and if by phone, the number) Disposition Plan: Pending   Consultants:  GI  Cardiology  Procedures:    Antibiotics:   (indicate start date, and stop date if known)  HPI/Subjective: No complaints. States feeling well currently  Objective: Filed Vitals:   01/03/15 1233 01/03/15 1351 01/03/15 1516 01/03/15 1818  BP: 121/59 125/61 111/56 114/58  Pulse: 73 75 61 86  Temp: 98.1 F (36.7 C) 98.3 F (36.8 C) 98.5 F (36.9 C) 97.9 F (36.6 C)  TempSrc: Oral Oral Oral Oral  Resp: 18 14 16 16   Height:      Weight:      SpO2: 100% 100% 100% 100%    Intake/Output Summary (Last 24 hours) at 01/03/15 2033 Last data filed at 01/03/15 1946  Gross per 24 hour  Intake    240 ml  Output    450 ml  Net   -210 ml   Filed Weights   01/01/15 1942 01/02/15 0020  Weight: 41.958 kg (92 lb 8 oz) 44.135 kg (97 lb 4.8 oz)    Exam:   General:  Awake, in nad  Cardiovascular: regular, s1, s2  Respiratory: normal resp effort, no wheezing  Abdomen:  soft,nondistended  Musculoskeletal: perfused, no clubbing   Data Reviewed: Basic Metabolic Panel:  Recent Labs Lab 01/01/15 1957 01/02/15 1020 01/02/15 1630 01/03/15 0629  NA 132* 130*  --  128*  K 4.2 4.0  --  3.6  CL 97 96  --  98  CO2 19 16*  --  21  GLUCOSE 69* 35*  --  90  BUN 6 6  --  5*  CREATININE 0.68 0.83  --  0.58  CALCIUM 8.3* 7.9*  --  7.6*  MG  --   --  2.0  --    Liver Function Tests:  Recent Labs Lab 01/01/15 2108 01/02/15 1020 01/03/15 0629  AST 44* 57* 43*  ALT 25 27 26   ALKPHOS 135* 135* 131*  BILITOT 0.8 1.9* 1.2  PROT 6.9 6.1 5.2*  ALBUMIN 2.8* 2.5* 2.2*   No results for input(s): LIPASE, AMYLASE in the last 168 hours. No results for input(s): AMMONIA in the last 168 hours. CBC:  Recent Labs Lab 01/01/15 1957 01/01/15 2304 01/02/15 1020 01/03/15 0802 01/03/15 1840  WBC 9.6 9.2 10.3  --   --   NEUTROABS  --  6.6 8.3*  --   --   HGB 4.9* 4.5* 6.5* 9.6* 10.1*  HCT 19.3* 17.7* 23.2* 31.1* 32.6*  MCV 57.8* 57.8* 66.3*  --   --   PLT 301 302 242  --   --    Cardiac Enzymes:  Recent Labs Lab 01/03/15 1659  TROPONINI 0.04*   BNP (last 3 results)  Recent Labs  01/01/15 1957  BNP 20.4    ProBNP (last 3 results) No results for input(s): PROBNP in the last 8760 hours.  CBG:  Recent Labs Lab 01/02/15 1512 01/02/15 1710 01/02/15 2240 01/03/15 1231 01/03/15 1713  GLUCAP 181* 150* 129* 112* 92    No results found for this or any previous visit (from the past 240 hour(s)).   Studies: Ct Chest W Contrast  01/02/2015   CLINICAL DATA:  Acute onset of shortness of breath and difficulty swallowing. Weight loss. Initial encounter.  EXAM: CT CHEST, ABDOMEN, AND PELVIS WITH CONTRAST  TECHNIQUE: Multidetector CT imaging of the chest, abdomen and pelvis was performed following the standard protocol during bolus administration of intravenous contrast.  CONTRAST:  68mL OMNIPAQUE IOHEXOL 300 MG/ML  SOLN  COMPARISON:  CTA of the chest  performed 10/01/2011  FINDINGS: CT CHEST FINDINGS  The previously noted opacity at the right lung base appears to have reflected an impacted bronchiole. This has since resolved. The lungs are clear bilaterally. No focal consolidation, pleural effusion or pneumothorax is seen. No masses are identified.  The mediastinum is unremarkable in appearance. No mediastinal lymphadenopathy is seen. Diffuse coronary artery calcifications are noted. No pericardial effusion is identified. The ascending thoracic aorta is borderline normal in caliber. The great vessels are grossly unremarkable in appearance.  The thyroid gland is unremarkable. No axillary lymphadenopathy is seen.  No acute osseous abnormalities are identified.  CT ABDOMEN AND PELVIS FINDINGS  There is somewhat unusual marked gastric wall thickening along the greater curvature of the stomach. This may reflect some degree of underlying acute gastritis, though malignancy is also a concern. Endoscopy would be helpful for further evaluation, as deemed clinically appropriate.  Somewhat prominent surrounding gastric vessels are noted.  The liver and spleen are unremarkable in appearance. The gallbladder is within normal limits. The pancreas and adrenal glands are unremarkable.  The kidneys are unremarkable in appearance. There is no evidence of hydronephrosis. No renal or ureteral stones are seen. No perinephric stranding is appreciated.  No free fluid is identified. The small bowel is unremarkable in appearance. The stomach is within normal limits. No acute vascular abnormalities are seen. Scattered calcification is noted along the abdominal aorta and its branches.  The appendix is not definitely characterized; there is no evidence of appendicitis. The colon is grossly unremarkable in appearance. Trace stranding along the descending colon is nonspecific and likely chronic in nature.  The bladder is mildly distended and grossly unremarkable. The prostate remains normal in  size, with scattered calcification. No inguinal lymphadenopathy is seen.  No acute osseous abnormalities are identified.  IMPRESSION: 1. Somewhat unusual marked gastric wall thickening along the greater curvature of the stomach. This may reflect underlying acute gastritis, though malignancy is also a concern. Endoscopy would be helpful for further evaluation, as deemed clinically appropriate. 2. Lungs clear bilaterally. 3. Diffuse coronary artery calcifications seen. 4. Scattered calcification along the abdominal aorta and its branches. 5. Colon grossly unremarkable in appearance.   Electronically Signed   By: Garald Balding M.D.   On: 01/02/2015 03:21   Ct Abdomen Pelvis W Contrast  01/02/2015   CLINICAL DATA:  Acute onset of shortness of breath and difficulty swallowing. Weight loss. Initial encounter.  EXAM: CT CHEST, ABDOMEN, AND PELVIS WITH CONTRAST  TECHNIQUE: Multidetector CT imaging of the chest, abdomen and pelvis was performed following the standard protocol during bolus  administration of intravenous contrast.  CONTRAST:  15mL OMNIPAQUE IOHEXOL 300 MG/ML  SOLN  COMPARISON:  CTA of the chest performed 10/01/2011  FINDINGS: CT CHEST FINDINGS  The previously noted opacity at the right lung base appears to have reflected an impacted bronchiole. This has since resolved. The lungs are clear bilaterally. No focal consolidation, pleural effusion or pneumothorax is seen. No masses are identified.  The mediastinum is unremarkable in appearance. No mediastinal lymphadenopathy is seen. Diffuse coronary artery calcifications are noted. No pericardial effusion is identified. The ascending thoracic aorta is borderline normal in caliber. The great vessels are grossly unremarkable in appearance.  The thyroid gland is unremarkable. No axillary lymphadenopathy is seen.  No acute osseous abnormalities are identified.  CT ABDOMEN AND PELVIS FINDINGS  There is somewhat unusual marked gastric wall thickening along the greater  curvature of the stomach. This may reflect some degree of underlying acute gastritis, though malignancy is also a concern. Endoscopy would be helpful for further evaluation, as deemed clinically appropriate.  Somewhat prominent surrounding gastric vessels are noted.  The liver and spleen are unremarkable in appearance. The gallbladder is within normal limits. The pancreas and adrenal glands are unremarkable.  The kidneys are unremarkable in appearance. There is no evidence of hydronephrosis. No renal or ureteral stones are seen. No perinephric stranding is appreciated.  No free fluid is identified. The small bowel is unremarkable in appearance. The stomach is within normal limits. No acute vascular abnormalities are seen. Scattered calcification is noted along the abdominal aorta and its branches.  The appendix is not definitely characterized; there is no evidence of appendicitis. The colon is grossly unremarkable in appearance. Trace stranding along the descending colon is nonspecific and likely chronic in nature.  The bladder is mildly distended and grossly unremarkable. The prostate remains normal in size, with scattered calcification. No inguinal lymphadenopathy is seen.  No acute osseous abnormalities are identified.  IMPRESSION: 1. Somewhat unusual marked gastric wall thickening along the greater curvature of the stomach. This may reflect underlying acute gastritis, though malignancy is also a concern. Endoscopy would be helpful for further evaluation, as deemed clinically appropriate. 2. Lungs clear bilaterally. 3. Diffuse coronary artery calcifications seen. 4. Scattered calcification along the abdominal aorta and its branches. 5. Colon grossly unremarkable in appearance.   Electronically Signed   By: Garald Balding M.D.   On: 01/02/2015 03:21    Scheduled Meds: . feeding supplement (ENSURE ENLIVE)  237 mL Oral TID BM  . folic acid  1 mg Oral Daily  . multivitamin with minerals  1 tablet Oral Daily  .  nicotine  21 mg Transdermal Daily  . pantoprazole (PROTONIX) IV  40 mg Intravenous Q12H  . banana bag IV 1000 mL   Intravenous Once  . thiamine  100 mg Oral Daily   Or  . thiamine  100 mg Intravenous Daily   Continuous Infusions: . sodium chloride 20 mL/hr at 01/03/15 1400  . dextrose 5 % and 0.9% NaCl Stopped (01/03/15 1330)  . lactated ringers      Active Problems:   Anemia due to GI blood loss   Tobacco abuse   Weight loss, abnormal   GIB (gastrointestinal bleeding)   Anemia   Dyspnea   ETOH abuse   Severe protein-calorie malnutrition   Dysphagia, pharyngoesophageal phase   Chest pain   James [name], [facility] Pager [phone]. If 7PM-7AM, please contact night-coverage at [email or web address], password [facility] 01/03/2015, 8:33 PM  LOS: 2 days

## 2015-01-03 NOTE — Anesthesia Preprocedure Evaluation (Addendum)
Anesthesia Evaluation  Patient identified by MRN, date of birth, ID band Patient awake    Reviewed: Allergy & Precautions, NPO status , Patient's Chart, lab work & pertinent test results  History of Anesthesia Complications Negative for: history of anesthetic complications  Airway Mallampati: II  TM Distance: >3 FB Neck ROM: Full    Dental  (+) Missing, Poor Dentition, Dental Advisory Given   Pulmonary COPDCurrent Smoker,  breath sounds clear to auscultation        Cardiovascular + angina with exertion Rhythm:Regular Rate:Normal  01/02/15 ECHO: EF 55-60%, mod AI, mild TR   Neuro/Psych negative neurological ROS     GI/Hepatic GERD-  Medicated and Poorly Controlled,(+)     substance abuse  alcohol use and marijuana use,   Endo/Other    Renal/GU      Musculoskeletal   Abdominal   Peds  Hematology negative hematology ROS (+) Blood dyscrasia (Hb 9.6), ,   Anesthesia Other Findings   Reproductive/Obstetrics                          Anesthesia Physical Anesthesia Plan  ASA: III  Anesthesia Plan: MAC   Post-op Pain Management:    Induction:   Airway Management Planned: Natural Airway and Nasal Cannula  Additional Equipment:   Intra-op Plan:   Post-operative Plan:   Informed Consent:   Dental advisory given  Plan Discussed with: CRNA and Surgeon  Anesthesia Plan Comments: (Given patient's c/o of chest pain radiating to shoulder, dyspnea with exertion, Dr Deatra Ina and I are in agreement for cardiology to eval patient prior to elective endoscopy  )        Anesthesia Quick Evaluation

## 2015-01-03 NOTE — H&P (View-Only) (Signed)
[facility] Consult: 11:17 AM 01/02/2015  LOS: 1 day    Referring Provider: Dr Clementeen Graham  Primary Care Physician:  Wyatt Haste, MD Primary Gastroenterologist:  Dr. Henrene Pastor     Reason for Consultation:  GI bleed   HPI: James [name] is a 59 y.o. male.  Heavy drinker, smoker. Work up for microcytic anemia in 09/2011 when Hgb 3.5, MCV in 50s.  10/03/2011 Colonoscopy 1) Three large polyps in the mid transverse colon (TA without HGD) and rectum (tubular adenoma with HGD) removed 2) Moderate diverticulosis found scattered throught the colon 3) Internal hemorrhoids Suggested follow up surveillance colonoscopy was 3 years/Jan 2016.  10/02/2011 EGD 1) Esophagitis in the distal esophagus.  2) Stricture in the distal esophagus.  3) Otherwise normal esophagus.  4) Normal stomach.  5) Normal duodenum  Presented to ED with 2 to 3 weeks of progressive DOE, to the point of profound SOB, tachycardia  but no chest pain when walking 2 to 3 steps.  Recurrent emesis for 3 weeks, non-bloody.  Describes food feeling like it sticks in lower esophageal region.  Last week had 2 loose, black stools but otherwise brown BMs. ETOH of 2 -3 beers daily, 12 pack plus ~ 1 pint of gin over a weekend.  Smokes pot but no use currently or in past of cocaine, IVD etc. Still smoking cigarettes 1ppd.  10 to 20 # weight loss over 3 months.  No NSAIDs, ASA.  No GI protective meds.   Hgb of 4.9, last Hgb was 09/2011 when Hgb of 10.9 following multiple PRBCs.  MCV 57.  Coags normal.  BUN not elevated.  AST 44, alk phos 135 otherwise LFTs normal.  Hgb is 6.5 after 2 units PRBCs. CT chest/abdomen/pelvis: thickened gastric wall, coronary artery and abdominal aorta calcifications, grossly normal colon.  Admitted    Past Medical History  Diagnosis Date   . GERD (gastroesophageal reflux disease)   . Dysphagia   . Alcohol abuse   . Anemia due to GI blood loss 09/2011    microcytic.  transfused for Hgb 3.5, MCV in 50s.   . Benign neoplasm of colon   . Cachexia   . Edema   . ILD (interstitial lung disease)   . Iron deficiency anemia, unspecified   . Pulmonary nodule   . Reflux esophagitis   . Tobacco abuse   . Weight loss, abnormal   . Smoker   . ED (erectile dysfunction)   . Aortic insufficiency     Past Surgical History  Procedure Laterality Date  . Esophagogastroduodenoscopy  10/02/2011    Procedure: ESOPHAGOGASTRODUODENOSCOPY (EGD);  Surgeon: Scarlette Shorts, MD;  Location: [facility] ENDOSCOPY;  Service: Endoscopy;  Laterality: N/A;  . Colonoscopy  10/03/2011    Procedure: COLONOSCOPY;  Surgeon: Scarlette Shorts, MD;  Location: [facility];  Service: Endoscopy;  Laterality: N/A;    Prior to Admission medications   Medication Sig Start Date End Date Taking? Authorizing Provider  Avanafil (STENDRA) 100 MG TABS Take 100 capsules by mouth 1 day or 1 dose. 02/02/13   Elyse Jarvis  [employer], MD    Scheduled Meds: . folic acid  1 mg Oral Daily  . multivitamin with minerals  1 tablet Oral Daily  . [START ON 01/05/2015] pantoprazole (PROTONIX) IV  40 mg Intravenous Q12H  . banana bag IV 1000 mL   Intravenous Once  . thiamine  100 mg Oral Daily   Or  . thiamine  100 mg Intravenous Daily   Infusions: . sodium chloride 100 mL/hr at 01/02/15 0300  . pantoprozole (PROTONIX) infusion 8 mg/hr (01/01/15 2337)   PRN Meds: LORazepam **OR** LORazepam   Allergies as of 01/01/2015  . (No Known Allergies)    Family History  Problem Relation Age of Onset  . Hypertension Mother   . Diabetes Maternal Grandfather     History   Social History  . Marital Status: Married    Spouse Name: N/A  . Number of Children: N/A  . Years of Education: N/A   Occupational History  . unemployed    Social History Main Topics  . Smoking status: Current Every Day Smoker  -- 1.00 packs/day for 20 years    Types: Cigarettes  . Smokeless tobacco: Never Used     Comment: pt states he is down to 7 cigs per day  . Alcohol Use: 1.2 oz/week    2 Cans of beer per week  . Drug Use: Yes    Special: Marijuana  . Sexual Activity: Not Currently   Other Topics Concern  . Not on file   Social History Narrative    REVIEW OF SYSTEMS: Constitutional:  Per HPI ENT:  No nose bleeds Pulm:  Per HPI CV:  No LE edema. No chest pain.  Tachycardia with exertion. GU:  No hematuria, no frequency.  No oliguria. No tea-colored urine. GI:  Per HPI Heme:  Per HPI   Transfusions:  Per HPI Neuro:  No headaches, no peripheral tingling or numbness Derm:  No itching, no rash or sores.  Endocrine:  No sweats or chills.  No polyuria or dysuria Immunization:  None in last few years. Last recorded tetanus was 6/20o2.  Travel:  None beyond local counties in last few months.    PHYSICAL EXAM: Vital signs in last 24 hours: Filed Vitals:   01/02/15 0855  BP: 108/61  Pulse: 77  Temp: 97.9 F (36.6 C)  Resp: 19   Wt Readings from Last 3 Encounters:  01/02/15 97 lb 4.8 oz (44.135 kg)  03/09/13 119 lb (53.978 kg)  02/02/13 120 lb (54.432 kg)   General: Pleasant, comfortable, cachectic and generally ill appearing AAM. He looks older than stated age. Head:  No signs of head trauma. No facial asymmetry or swelling.  Eyes:  No scleral icterus, conjunctiva is pale. Ears:  Not hard of hearing.  Nose:  No congestion or nasal discharge Mouth:  Clear, moist.  No sores. Neck:  No JVD. No masses. No TMG. Lungs:  No dyspnea, no cough. Lungs clear to auscultation and percussion bilaterally. Heart: RRR. No MRG. S1/S2 audible Abdomen:  Soft, thin. No organomegaly. Active bowel sounds. No masses, no tenderness. No hernias or bruits..   Rectal: Deferred. Stool specimen submitted to lab is FOBT positive.   Musc/Skeltl: No joint contractures, swelling or gross deformity. Extremities:  No  pedal edema. Feet are warm  Neurologic:  Oriented 3. Appropriate. Provides a good history. Moves all 4 limbs. Limb strength not diminished. No tremor, no asterixis. Skin:  No rash, no sores. Nodes:  No cervical or inguinal adenopathy.  Psych:  Pleasant, cooperative. Affect somewhat flat but patient fully engaged in dialogue.  Intake/Output from previous day: 04/25 0701 - 04/26 0700 In: 970 [P.O.:480; Blood:490] Out: 80 [Urine:80] Intake/Output this shift: Total I/O In: 479 [P.O.:240; Blood:239] Out: 300 [Urine:300]  LAB RESULTS:  Recent Labs  01/01/15 1957 01/01/15 2304 01/02/15 1020  WBC 9.6 9.2 10.3  HGB 4.9* 4.5* 6.5*  HCT 19.3* 17.7* 23.2*  PLT 301 302 PENDING   BMET Lab Results  Component Value Date   NA 132* 01/01/2015   NA 134* 10/05/2011   NA 135 10/04/2011   K 4.2 01/01/2015   K 4.9 10/05/2011   K 4.1 10/04/2011   CL 97 01/01/2015   CL 106 10/05/2011   CL 108 10/04/2011   CO2 19 01/01/2015   CO2 24 10/05/2011   CO2 18* 10/04/2011   GLUCOSE 69* 01/01/2015   GLUCOSE 94 10/05/2011   GLUCOSE 101* 10/04/2011   BUN 6 01/01/2015   BUN 5* 10/05/2011   BUN 2* 10/04/2011   CREATININE 0.68 01/01/2015   CREATININE 0.55 10/05/2011   CREATININE 0.40* 10/04/2011   CALCIUM 8.3* 01/01/2015   CALCIUM 8.6 10/05/2011   CALCIUM 8.0* 10/04/2011   LFT  Recent Labs  01/01/15 2108  PROT 6.9  ALBUMIN 2.8*  AST 44*  ALT 25  ALKPHOS 135*  BILITOT 0.8  BILIDIR 0.2  IBILI 0.6   PT/INR Lab Results  Component Value Date   INR 1.02 01/01/2015   INR 1.18 10/02/2011   Lipase  No results found for: LIPASE  Drugs of Abuse     Component Value Date/Time   LABOPIA NONE DETECTED 01/01/2015 2300   COCAINSCRNUR NONE DETECTED 01/01/2015 2300   LABBENZ NONE DETECTED 01/01/2015 2300   AMPHETMU NONE DETECTED 01/01/2015 2300   THCU POSITIVE* 01/01/2015 2300   LABBARB NONE DETECTED 01/01/2015 2300     RADIOLOGY STUDIES: Ct Chest W Contrast Ct Abdomen Pelvis W  Contrast 01/02/2015  COMPARISON:  CTA of the chest performed 10/01/2011  FINDINGS: CT CHEST FINDINGS  The previously noted opacity at the right lung base appears to have reflected an impacted bronchiole. This has since resolved. The lungs are clear bilaterally. No focal consolidation, pleural effusion or pneumothorax is seen. No masses are identified.  The mediastinum is unremarkable in appearance. No mediastinal lymphadenopathy is seen. Diffuse coronary artery calcifications are noted. No pericardial effusion is identified. The ascending thoracic aorta is borderline normal in caliber. The great vessels are grossly unremarkable in appearance.  The thyroid gland is unremarkable. No axillary lymphadenopathy is seen.  No acute osseous abnormalities are identified.  CT ABDOMEN AND PELVIS FINDINGS  There is somewhat unusual marked gastric wall thickening along the greater curvature of the stomach. This may reflect some degree of underlying acute gastritis, though malignancy is also a concern. Endoscopy would be helpful for further evaluation, as deemed clinically appropriate.  Somewhat prominent surrounding gastric vessels are noted.  The liver and spleen are unremarkable in appearance. The gallbladder is within normal limits. The pancreas and adrenal glands are unremarkable.  The kidneys are unremarkable in appearance. There is no evidence of hydronephrosis. No renal or ureteral stones are seen. No perinephric stranding is appreciated.  No free fluid is identified. The small bowel is unremarkable in appearance. The stomach is within normal limits. No acute vascular abnormalities are seen. Scattered calcification is noted along the abdominal aorta and its branches.  The appendix is not definitely characterized; there is no evidence of appendicitis. The colon is  grossly unremarkable in appearance. Trace stranding along the descending colon is nonspecific and likely chronic in nature.  The bladder is mildly distended and  grossly unremarkable. The prostate remains normal in size, with scattered calcification. No inguinal lymphadenopathy is seen.  No acute osseous abnormalities are identified.  IMPRESSION: 1. Somewhat unusual marked gastric wall thickening along the greater curvature of the stomach. This may reflect underlying acute gastritis, though malignancy is also a concern. Endoscopy would be helpful for further evaluation, as deemed clinically appropriate. 2. Lungs clear bilaterally. 3. Diffuse coronary artery calcifications seen. 4. Scattered calcification along the abdominal aorta and its branches. 5. Colon grossly unremarkable in appearance.   Electronically Signed   By: Garald Balding M.D.   On: 01/02/2015 03:21    ENDOSCOPIC STUDIES: Per HPI  IMPRESSION:   *  Weight loss, n/v, recurrent microcytic anemia, FOBT + with dark stools last week. Suspect slow, chronic GI blood loss with possible accelerated losses in recent weeks.  Appropriate response to 2 units PRBCs.     Hx colon tubular adenomatous polyps, one with HGD, 09/2011 Hx esophagitis and distal esophageal stricture 09/2011.  Currently c/o dysphagia.   Gastric thickening by CT scan.  Empiric Protonix drip in place, not convinced he needs drip but will leave in place for now.   *  Alcoholism.  Minor elevation of AST and ALK phos.  CT appearance of liver, pancreas, biliary tree unremarkable.   *  Hyponatremia.    PLAN:     *  EGD with MAC is set for 1400 for 4/27.  May need esophageal dilatation.  Can trial full liquids for now.  Plan surveillance colonoscopy when nausea/vomiting subsides and he can complete bowel prep.   *  Hgb/hct BID. 2 more units of blood has been ordered to transfuse.   *  Changed Protonix to IV BID, let current drip complete   Azucena Freed  01/02/2015, 11:17 AM Pager: [phone]

## 2015-01-03 NOTE — Consult Note (Addendum)
Admit date: 01/01/2015 Referring Physician  Dr. Wyline Copas Primary Physician  Dr. Redmond School Primary Cardiologist  None Reason for Consultation:  Chest pain and SOB  HPI: This is a 59yo AAM with a history of anemia, cachexia, tobacco abuse, ETOH abuse, AI, interstitial lung disease who presented with progressive SOB over the past month.  He says that it gets so bad that he cannot walk very far and has to sit and rest.  He also has complained of chest pain that he describes as a pressure that is midsternal with radiation to the shoulders and associated with nausea.  He also states that he gets very fatigued and weak with the CP. Also reports at least 10-20 pound weight loss over the past 2-3 months. No fevers or chills. No night sweats. Has had recurrent episodes of emesis over the past 2-3 weeks. Nonbilious nonbloody. Unable to tolerate by mouth intake. Also with recurring episodes of diarrhea. Has noticed black stools over the past 2-3 days. Everyday drinker.  Drinks very heavily on the weekend up to 12 beers daily. Reports having colonoscopy approximately 2 years ago where to 3 polyps were removed per patient. No reported NSAID use.  Present to the ER afebrile, hemodynamically stable. Labs notable for hemoglobin 4.9. Hemoccult-positive. LFTs and CT of the chest abdomen and pelvis are pending. He got transfused 2 units of packed red cells.  Hbg today was 9.6 after transfusion.  GI wanted a cardiac workup prior to endoscoopy due to recent CP.  Troponin negative x 2 and BNP is normal.  EKG showed NSR with no ST changes.  Cardiology is now asked to consult.      PMH:   Past Medical History  Diagnosis Date  . GERD (gastroesophageal reflux disease)   . Dysphagia   . Alcohol abuse   . Anemia due to GI blood loss 09/2011    microcytic.  transfused for Hgb 3.5, MCV in 50s.   . Benign neoplasm of colon   . Cachexia   . Edema   . ILD (interstitial lung disease)   . Iron deficiency anemia, unspecified   .  Pulmonary nodule   . Reflux esophagitis   . Tobacco abuse   . Weight loss, abnormal   . Smoker   . ED (erectile dysfunction)   . Aortic insufficiency      PSH:   Past Surgical History  Procedure Laterality Date  . Esophagogastroduodenoscopy  10/02/2011    Procedure: ESOPHAGOGASTRODUODENOSCOPY (EGD);  Surgeon: Scarlette Shorts, MD;  Location: [facility] ENDOSCOPY;  Service: Endoscopy;  Laterality: N/A;  . Colonoscopy  10/03/2011    Procedure: COLONOSCOPY;  Surgeon: Scarlette Shorts, MD;  Location: [facility];  Service: Endoscopy;  Laterality: N/A;    Allergies:  Review of patient's allergies indicates no known allergies. Prior to Admit Meds:   Prescriptions prior to admission  Medication Sig Dispense Refill Last Dose  . Avanafil (STENDRA) 100 MG TABS Take 100 capsules by mouth 1 day or 1 dose. 3 tablet 0 Taking   Fam HX:    Family History  Problem Relation Age of Onset  . Hypertension Mother   . Diabetes Maternal Grandfather    Social HX:    History   Social History  . Marital Status: Married    Spouse Name: N/A  . Number of Children: N/A  . Years of Education: N/A   Occupational History  . unemployed    Social History Main Topics  . Smoking status: Current Every Day Smoker --  1.00 packs/day for 20 years    Types: Cigarettes  . Smokeless tobacco: Never Used     Comment: pt states he is down to 7 cigs per day  . Alcohol Use: 1.2 oz/week    2 Cans of beer per week  . Drug Use: Yes    Special: Marijuana  . Sexual Activity: Not Currently   Other Topics Concern  . Not on file   Social History Narrative     ROS:  All 11 ROS were addressed and are negative except what is stated in the HPI  Physical Exam: Blood pressure 111/56, pulse 61, temperature 98.5 F (36.9 C), temperature source Oral, resp. rate 16, height 5\' 10"  (1.778 m), weight 97 lb 4.8 oz (44.135 kg), SpO2 100 %.    General: Well developed, well nourished, in no acute distress Head: Eyes PERRLA, No xanthomas.   Normal  cephalic and atramatic  Lungs:   Clear bilaterally to auscultation and percussion. Heart:   HRRR S1 S2 Pulses are 2+ & equal.            No carotid bruit. No JVD.  No abdominal bruits. No femoral bruits. Abdomen: Bowel sounds are positive, abdomen soft and non-tender without masses  Extremities:   No clubbing, cyanosis or edema.  DP +1 Neuro: Alert and oriented X 3. Psych:  Good affect, responds appropriately    Labs:   Lab Results  Component Value Date   WBC 10.3 01/02/2015   HGB 9.6* 01/03/2015   HCT 31.1* 01/03/2015   MCV 66.3* 01/02/2015   PLT 242 01/02/2015    Recent Labs Lab 01/03/15 0629  NA 128*  K 3.6  CL 98  CO2 21  BUN 5*  CREATININE 0.58  CALCIUM 7.6*  PROT 5.2*  BILITOT 1.2  ALKPHOS 131*  ALT 26  AST 43*  GLUCOSE 90   No results found for: PTT Lab Results  Component Value Date   INR 1.02 01/01/2015   INR 1.18 10/02/2011   No results found for: CKTOTAL, CKMB, CKMBINDEX, TROPONINI  No results found for: CHOL No results found for: HDL No results found for: LDLCALC No results found for: TRIG No results found for: CHOLHDL No results found for: LDLDIRECT    Radiology:  Ct Chest W Contrast  01/02/2015   CLINICAL DATA:  Acute onset of shortness of breath and difficulty swallowing. Weight loss. Initial encounter.  EXAM: CT CHEST, ABDOMEN, AND PELVIS WITH CONTRAST  TECHNIQUE: Multidetector CT imaging of the chest, abdomen and pelvis was performed following the standard protocol during bolus administration of intravenous contrast.  CONTRAST:  4mL OMNIPAQUE IOHEXOL 300 MG/ML  SOLN  COMPARISON:  CTA of the chest performed 10/01/2011  FINDINGS: CT CHEST FINDINGS  The previously noted opacity at the right lung base appears to have reflected an impacted bronchiole. This has since resolved. The lungs are clear bilaterally. No focal consolidation, pleural effusion or pneumothorax is seen. No masses are identified.  The mediastinum is unremarkable in appearance. No  mediastinal lymphadenopathy is seen. Diffuse coronary artery calcifications are noted. No pericardial effusion is identified. The ascending thoracic aorta is borderline normal in caliber. The great vessels are grossly unremarkable in appearance.  The thyroid gland is unremarkable. No axillary lymphadenopathy is seen.  No acute osseous abnormalities are identified.  CT ABDOMEN AND PELVIS FINDINGS  There is somewhat unusual marked gastric wall thickening along the greater curvature of the stomach. This may reflect some degree of underlying acute gastritis, though malignancy is  also a concern. Endoscopy would be helpful for further evaluation, as deemed clinically appropriate.  Somewhat prominent surrounding gastric vessels are noted.  The liver and spleen are unremarkable in appearance. The gallbladder is within normal limits. The pancreas and adrenal glands are unremarkable.  The kidneys are unremarkable in appearance. There is no evidence of hydronephrosis. No renal or ureteral stones are seen. No perinephric stranding is appreciated.  No free fluid is identified. The small bowel is unremarkable in appearance. The stomach is within normal limits. No acute vascular abnormalities are seen. Scattered calcification is noted along the abdominal aorta and its branches.  The appendix is not definitely characterized; there is no evidence of appendicitis. The colon is grossly unremarkable in appearance. Trace stranding along the descending colon is nonspecific and likely chronic in nature.  The bladder is mildly distended and grossly unremarkable. The prostate remains normal in size, with scattered calcification. No inguinal lymphadenopathy is seen.  No acute osseous abnormalities are identified.  IMPRESSION: 1. Somewhat unusual marked gastric wall thickening along the greater curvature of the stomach. This may reflect underlying acute gastritis, though malignancy is also a concern. Endoscopy would be helpful for further  evaluation, as deemed clinically appropriate. 2. Lungs clear bilaterally. 3. Diffuse coronary artery calcifications seen. 4. Scattered calcification along the abdominal aorta and its branches. 5. Colon grossly unremarkable in appearance.   Electronically Signed   By: Garald Balding M.D.   On: 01/02/2015 03:21   Ct Abdomen Pelvis W Contrast  01/02/2015   CLINICAL DATA:  Acute onset of shortness of breath and difficulty swallowing. Weight loss. Initial encounter.  EXAM: CT CHEST, ABDOMEN, AND PELVIS WITH CONTRAST  TECHNIQUE: Multidetector CT imaging of the chest, abdomen and pelvis was performed following the standard protocol during bolus administration of intravenous contrast.  CONTRAST:  6mL OMNIPAQUE IOHEXOL 300 MG/ML  SOLN  COMPARISON:  CTA of the chest performed 10/01/2011  FINDINGS: CT CHEST FINDINGS  The previously noted opacity at the right lung base appears to have reflected an impacted bronchiole. This has since resolved. The lungs are clear bilaterally. No focal consolidation, pleural effusion or pneumothorax is seen. No masses are identified.  The mediastinum is unremarkable in appearance. No mediastinal lymphadenopathy is seen. Diffuse coronary artery calcifications are noted. No pericardial effusion is identified. The ascending thoracic aorta is borderline normal in caliber. The great vessels are grossly unremarkable in appearance.  The thyroid gland is unremarkable. No axillary lymphadenopathy is seen.  No acute osseous abnormalities are identified.  CT ABDOMEN AND PELVIS FINDINGS  There is somewhat unusual marked gastric wall thickening along the greater curvature of the stomach. This may reflect some degree of underlying acute gastritis, though malignancy is also a concern. Endoscopy would be helpful for further evaluation, as deemed clinically appropriate.  Somewhat prominent surrounding gastric vessels are noted.  The liver and spleen are unremarkable in appearance. The gallbladder is within  normal limits. The pancreas and adrenal glands are unremarkable.  The kidneys are unremarkable in appearance. There is no evidence of hydronephrosis. No renal or ureteral stones are seen. No perinephric stranding is appreciated.  No free fluid is identified. The small bowel is unremarkable in appearance. The stomach is within normal limits. No acute vascular abnormalities are seen. Scattered calcification is noted along the abdominal aorta and its branches.  The appendix is not definitely characterized; there is no evidence of appendicitis. The colon is grossly unremarkable in appearance. Trace stranding along the descending colon is nonspecific and likely  chronic in nature.  The bladder is mildly distended and grossly unremarkable. The prostate remains normal in size, with scattered calcification. No inguinal lymphadenopathy is seen.  No acute osseous abnormalities are identified.  IMPRESSION: 1. Somewhat unusual marked gastric wall thickening along the greater curvature of the stomach. This may reflect underlying acute gastritis, though malignancy is also a concern. Endoscopy would be helpful for further evaluation, as deemed clinically appropriate. 2. Lungs clear bilaterally. 3. Diffuse coronary artery calcifications seen. 4. Scattered calcification along the abdominal aorta and its branches. 5. Colon grossly unremarkable in appearance.   Electronically Signed   By: Garald Balding M.D.   On: 01/02/2015 03:21    EKG:  NSR with no ST changes  ASSESSMENT/PLAN:  1.  Chest pain that sounds like angina and is exertional and resolves with rest.  His EKG is nonischemic and his trop is normal.  His Hbg was 4.5 which could account for chest pain due to supply demand mismatch in absence of CAD.  He is a smoker though, so increased risk for CAD.  2D echo with normal LVF, DD and moderate AR.  Cycle cardiac enzymes.   If normal then consider Coronary CT angio with morphology and calcium score vs. Nuclear stress test.  If  Hbg drops again would favor CT over stress test given risk to promote ischemia in setting of severe anemia.  GI would like this worked up before endoscopy. 2.  SOB most likely secondary to severe anemia again with supply demand mismatch.   2D echo with normal LVF and diastolic dysfunction and normal BNP - suspect this is from anemia 3.  Severe anemia secondary to GI bleed - per GI.  He is a heavy drinker.      Sueanne Margarita, MD  01/03/2015  4:06 PM

## 2015-01-04 ENCOUNTER — Encounter (HOSPITAL_COMMUNITY): Payer: Self-pay

## 2015-01-04 ENCOUNTER — Inpatient Hospital Stay (HOSPITAL_COMMUNITY): Payer: BC Managed Care – PPO

## 2015-01-04 DIAGNOSIS — D649 Anemia, unspecified: Secondary | ICD-10-CM

## 2015-01-04 DIAGNOSIS — F1210 Cannabis abuse, uncomplicated: Secondary | ICD-10-CM

## 2015-01-04 DIAGNOSIS — R0789 Other chest pain: Secondary | ICD-10-CM

## 2015-01-04 DIAGNOSIS — R079 Chest pain, unspecified: Secondary | ICD-10-CM

## 2015-01-04 LAB — COMPREHENSIVE METABOLIC PANEL
ALBUMIN: 2.2 g/dL — AB (ref 3.5–5.2)
ALT: 27 U/L (ref 0–53)
AST: 47 U/L — AB (ref 0–37)
Alkaline Phosphatase: 121 U/L — ABNORMAL HIGH (ref 39–117)
Anion gap: 9 (ref 5–15)
BUN: <5 mg/dL — ABNORMAL LOW (ref 6–23)
CALCIUM: 7.8 mg/dL — AB (ref 8.4–10.5)
CO2: 21 mmol/L (ref 19–32)
Chloride: 102 mmol/L (ref 96–112)
Creatinine, Ser: 0.65 mg/dL (ref 0.50–1.35)
GFR calc non Af Amer: >90 mL/min (ref >90)
Glucose, Bld: 76 mg/dL (ref 70–99)
Potassium: 4.6 mmol/L (ref 3.5–5.1)
Sodium: 132 mmol/L — ABNORMAL LOW (ref 135–145)
TOTAL PROTEIN: 5.5 g/dL — AB (ref 6.0–8.3)
Total Bilirubin: 1 mg/dL (ref 0.3–1.2)

## 2015-01-04 LAB — GLUCOSE, CAPILLARY
GLUCOSE-CAPILLARY: 125 mg/dL — AB (ref 70–99)
Glucose-Capillary: 92 mg/dL (ref 70–99)
Glucose-Capillary: 132 mg/dL — ABNORMAL HIGH (ref 70–99)
Glucose-Capillary: 101 mg/dL — ABNORMAL HIGH (ref 70–99)

## 2015-01-04 LAB — TROPONIN I
TROPONIN I: 0.04 ng/mL — AB (ref <0.031)
Troponin I: <0.03 ng/mL (ref <0.031)

## 2015-01-04 LAB — HEMOGLOBIN AND HEMATOCRIT, BLOOD
HCT: 32.7 % — ABNORMAL LOW (ref 39.0–52.0)
Hemoglobin: 9.9 g/dL — ABNORMAL LOW (ref 13.0–17.0)

## 2015-01-04 MED ORDER — METOPROLOL TARTRATE 1 MG/ML IV SOLN
5.0000 mg | Freq: Once | INTRAVENOUS | Status: AC
  Administered 2015-01-04: 5 mg via INTRAVENOUS

## 2015-01-04 MED ORDER — IOHEXOL 350 MG/ML SOLN
80.0000 mL | Freq: Once | INTRAVENOUS | Status: AC | PRN
  Administered 2015-01-04: 80 mL via INTRAVENOUS

## 2015-01-04 MED ORDER — PANTOPRAZOLE SODIUM 40 MG PO TBEC
40.0000 mg | DELAYED_RELEASE_TABLET | Freq: Every day | ORAL | Status: DC
  Administered 2015-01-04 – 2015-01-05 (×2): 40 mg via ORAL
  Filled 2015-01-04 (×2): qty 1

## 2015-01-04 MED ORDER — METOPROLOL TARTRATE 1 MG/ML IV SOLN
INTRAVENOUS | Status: AC
  Administered 2015-01-04: 5 mg via INTRAVENOUS
  Filled 2015-01-04: qty 5

## 2015-01-04 NOTE — Progress Notes (Signed)
    Subjective: No CP.  Prior to admission he would develop dyspnea and shoulder/chest pain with exertion.   Objective: Vital signs in last 24 hours: Temp:  [97.9 F (36.6 C)-98.8 F (37.1 C)] 98.5 F (36.9 C) (04/28 0521) Pulse Rate:  [61-86] 71 (04/28 0521) Resp:  [14-18] 18 (04/28 0521) BP: (99-125)/(56-66) 99/57 mmHg (04/28 0521) SpO2:  [100 %] 100 % (04/28 0521) Last BM Date: 01/04/15  Intake/Output from previous day: 04/27 0701 - 04/28 0700 In: 240 [P.O.:240] Out: 350 [Urine:350] Intake/Output this shift:    Medications Scheduled Meds: . feeding supplement (ENSURE ENLIVE)  237 mL Oral TID BM  . folic acid  1 mg Oral Daily  . multivitamin with minerals  1 tablet Oral Daily  . nicotine  21 mg Transdermal Daily  . pantoprazole  40 mg Oral Q0600  . banana bag IV 1000 mL   Intravenous Once  . thiamine  100 mg Oral Daily   Or  . thiamine  100 mg Intravenous Daily   Continuous Infusions: . dextrose 5 % and 0.9% NaCl Stopped (01/03/15 1330)  . lactated ringers     PRN Meds:.LORazepam **OR** LORazepam  PE: General appearance: alert, cooperative and Very thin Lungs: clear to auscultation bilaterally Heart: regular rate and rhythm, S1, S2 normal, no murmur, click, rub or gallop Abdomen: +BS, nontender Extremities: No LEE Pulses: 2+ and symmetric Skin: Warm and dry Neurologic: Grossly normal  Lab Results:   Recent Labs  01/01/15 1957 01/01/15 2304 01/02/15 1020 01/03/15 0802 01/03/15 1840 01/04/15 0800  WBC 9.6 9.2 10.3  --   --   --   HGB 4.9* 4.5* 6.5* 9.6* 10.1* 9.9*  HCT 19.3* 17.7* 23.2* 31.1* 32.6* 32.7*  PLT 301 302 242  --   --   --    BMET  Recent Labs  01/02/15 1020 01/03/15 0629 01/04/15 0510  NA 130* 128* 132*  K 4.0 3.6 4.6  CL 96 98 102  CO2 16* 21 21  GLUCOSE 35* 90 76  BUN 6 5* <5*  CREATININE 0.83 0.58 0.65  CALCIUM 7.9* 7.6* 7.8*   PT/INR  Recent Labs  01/01/15 2108  LABPROT 13.5  INR 1.02    Assessment/Plan 59yo  AAM with a history of anemia, cachexia, tobacco abuse, ETOH abuse, AI, interstitial lung disease who presented with progressive SOB over the past month.  Active Problems:   Anemia due to GI blood loss   Tobacco abuse   Weight loss, abnormal   GIB (gastrointestinal bleeding)   Anemia   Dyspnea   ETOH abuse   Severe protein-calorie malnutrition   Dysphagia, pharyngoesophageal phase   Chest pain  Troponin 0.04 in the setting of severe anemia(Hgb 4.5 on 4/26 and 6.5 after four units red cells.)  Stable now at 9.9.  Coronary CT today for preop prior to EGD and colonoscopy.    Echo: EF 55-60% with normal wall motion and G1DD.     LOS: 3 days    HAGER, BRYAN PA-C 01/04/2015 11:05 AM  Personally seen and examined. Agree with above. Thin male, RRR Cor CTA for pre op Reviewed labs and Dr. Radford Pax note  Candee Furbish, MD

## 2015-01-04 NOTE — Progress Notes (Signed)
Daily Rounding Note  01/04/2015, 8:48 AM  LOS: 3 days   SUBJECTIVE:       Hungry.  Tolerating solids, no n/v for >36 hours.  No weakness, no dizziness.  Feels better  OBJECTIVE:         Vital signs in last 24 hours:    Temp:  [97.9 F (36.6 C)-98.8 F (37.1 C)] 98.5 F (36.9 C) (04/28 0521) Pulse Rate:  [61-86] 71 (04/28 0521) Resp:  [14-18] 18 (04/28 0521) BP: (99-125)/(56-66) 99/57 mmHg (04/28 0521) SpO2:  [100 %] 100 % (04/28 0521) Last BM Date: 01/04/15 Filed Weights   01/01/15 1942 01/02/15 0020  Weight: 92 lb 8 oz (41.958 kg) 97 lb 4.8 oz (44.135 kg)   General: looks unwell, cachectic.  Alert, comfortable   Heart: RRR Chest: clear bil.   Abdomen: soft, active BS, NT, ND.    Extremities: no CCE.  Muscle wasting in all limbs.  Neuro/Psych:  Oriented x 3.  Pleasant, relaxed.   Intake/Output from previous day: 04/27 0701 - 04/28 0700 In: 240 [P.O.:240] Out: 350 [Urine:350]  Intake/Output this shift:    Lab Results:  Recent Labs  01/01/15 1957 01/01/15 2304 01/02/15 1020 01/03/15 0802 01/03/15 1840 01/04/15 0800  WBC 9.6 9.2 10.3  --   --   --   HGB 4.9* 4.5* 6.5* 9.6* 10.1* 9.9*  HCT 19.3* 17.7* 23.2* 31.1* 32.6* 32.7*  PLT 301 302 242  --   --   --    BMET  Recent Labs  01/02/15 1020 01/03/15 0629 01/04/15 0510  NA 130* 128* 132*  K 4.0 3.6 4.6  CL 96 98 102  CO2 16* 21 21  GLUCOSE 35* 90 76  BUN 6 5* <5*  CREATININE 0.83 0.58 0.65  CALCIUM 7.9* 7.6* 7.8*   LFT  Recent Labs  01/01/15 2108 01/02/15 1020 01/03/15 0629 01/04/15 0510  PROT 6.9 6.1 5.2* 5.5*  ALBUMIN 2.8* 2.5* 2.2* 2.2*  AST 44* 57* 43* 47*  ALT 25 27 26 27   ALKPHOS 135* 135* 131* 121*  BILITOT 0.8 1.9* 1.2 1.0  BILIDIR 0.2  --   --   --   IBILI 0.6  --   --   --    Cardiac Panel (last 3 results)  Recent Labs  01/03/15 1659 01/03/15 2225 01/04/15 0510  TROPONINI 0.04* <0.03 0.04*    PT/INR  Recent Labs  01/01/15 2108  LABPROT 13.5  INR 1.02   Hepatitis Panel  Recent Labs  01/02/15 1815  HEPBSAG NEGATIVE  HCVAB NEGATIVE  HEPAIGM NON REACTIVE  HEPBIGM NON REACTIVE    Studies/Results: No results found.   Scheduled Meds: . feeding supplement (ENSURE ENLIVE)  237 mL Oral TID BM  . folic acid  1 mg Oral Daily  . multivitamin with minerals  1 tablet Oral Daily  . nicotine  21 mg Transdermal Daily  . pantoprazole (PROTONIX) IV  40 mg Intravenous Q12H  . banana bag IV 1000 mL   Intravenous Once  . thiamine  100 mg Oral Daily   Or  . thiamine  100 mg Intravenous Daily   Continuous Infusions: . sodium chloride 20 mL/hr at 01/03/15 1400  . dextrose 5 % and 0.9% NaCl Stopped (01/03/15 1330)  . lactated ringers     PRN Meds:.LORazepam **OR** LORazepam   ASSESMENT:   * Weight loss, n/v, recurrent microcytic anemia, FOBT + with dark stools last week. Suspect slow, chronic GI  blood loss with possible accelerated losses in recent weeks.  Ferritin 9, iron 12, B12/Folate ok.  Target and teardrop cells on periph smear.  Appropriate response to 4 units PRBCs.  Hx colon tubular adenomatous polyps, one with HGD, 09/2011 Hx esophagitis and distal esophageal stricture 09/2011. Currently c/o dysphagia.  Gastric thickening by CT scan.  IV protonix in place.  N/V resolved, appetite improved  * Alcoholism. Minor elevation of AST and ALK phos. CT appearance of liver, pancreas, biliary tree unremarkable.   * Hyponatremia.  Improved.   *  Chest pain.  Minimal bump on 2 of 3 Troponins.  Await cardiology plans as to investigational studies.    PLAN   *  Cardiac CT today.    *  Plan colon/egd once cardiology testing and clearance obtained.      *  Restart diet.  Once timing of colon/egd set, will convert to clears.   *  Switch to oral Protonix.     Azucena Freed  01/04/2015, 8:48 AM Pager: [phone]

## 2015-01-04 NOTE — Progress Notes (Signed)
[facility] PROGRESS NOTE  James [name] [facility] YDX:[id number] DOB: [birth year] DOA: 01/01/2015 PCP: Wyatt Haste, MD  Assessment/Plan: 1. Acute blood loss anemia 1. GI consulted 2. Ultimately plans for endoscopy after cardiac clearance (see below) 3. Hemoglobin remaining stable at around 10 4. Continue to follow h/h 2. Chest pain 1. Likely secondary to demand mismatch 2. Cardiology was consulted 3. Pt is now s/p cardiac CT 4. Pain free currently 3. Interstitial lung disease 1. Stable 2. Followed by Pulmonary as outpatient 4. ETOH abuse and tobacco abuse 1. On CIWA 2. Nicotine patch 5. Severe protein calorie malnutrition 1. Nutrition consulted 6. Unintentional wt loss 1. CEA and CA-125 both unremarkable 7. Hyponatremia 1. Stable 2. Cont to monitor 8. DVT prophylaxis 1. SCD's 9. Marijuana abuse 1. Cessation done at bedside 2. Pt reports smoking to "get high" 3. Pt reminded marijuana is illegal and should stop  Code Status: Full Family Communication: Pt in room Disposition Plan: Pending   Consultants:  GI  Cardiology  Procedures:    Antibiotics:   (indicate start date, and stop date if known)  HPI/Subjective: Denies chest pain or sob. No complaints currently  Objective: Filed Vitals:   01/04/15 1210 01/04/15 1507 01/04/15 1530 01/04/15 1550  BP: 100/48   97/55  Pulse: 75 77 72 68  Temp: 98.3 F (36.8 C)     TempSrc: Oral     Resp: 16     Height:      Weight:      SpO2: 100%       Intake/Output Summary (Last 24 hours) at 01/04/15 1727 Last data filed at 01/04/15 1508  Gross per 24 hour  Intake    600 ml  Output    650 ml  Net    -50 ml   Filed Weights   01/01/15 1942 01/02/15 0020  Weight: 41.958 kg (92 lb 8 oz) 44.135 kg (97 lb 4.8 oz)    Exam:   General:  Awake, sitting in bed, in nad  Cardiovascular: regular, s1, s2  Respiratory: normal resp effort, no wheezing, no crackles  Abdomen: soft,nondistended, pos  BS  Musculoskeletal: perfused, no clubbing   Data Reviewed: Basic Metabolic Panel:  Recent Labs Lab 01/01/15 1957 01/02/15 1020 01/02/15 1630 01/03/15 0629 01/04/15 0510  NA 132* 130*  --  128* 132*  K 4.2 4.0  --  3.6 4.6  CL 97 96  --  98 102  CO2 19 16*  --  21 21  GLUCOSE 69* 35*  --  90 76  BUN 6 6  --  5* <5*  CREATININE 0.68 0.83  --  0.58 0.65  CALCIUM 8.3* 7.9*  --  7.6* 7.8*  MG  --   --  2.0  --   --    Liver Function Tests:  Recent Labs Lab 01/01/15 2108 01/02/15 1020 01/03/15 0629 01/04/15 0510  AST 44* 57* 43* 47*  ALT 25 27 26 27   ALKPHOS 135* 135* 131* 121*  BILITOT 0.8 1.9* 1.2 1.0  PROT 6.9 6.1 5.2* 5.5*  ALBUMIN 2.8* 2.5* 2.2* 2.2*   No results for input(s): LIPASE, AMYLASE in the last 168 hours. No results for input(s): AMMONIA in the last 168 hours. CBC:  Recent Labs Lab 01/01/15 1957 01/01/15 2304 01/02/15 1020 01/03/15 0802 01/03/15 1840 01/04/15 0800  WBC 9.6 9.2 10.3  --   --   --   NEUTROABS  --  6.6 8.3*  --   --   --  HGB 4.9* 4.5* 6.5* 9.6* 10.1* 9.9*  HCT 19.3* 17.7* 23.2* 31.1* 32.6* 32.7*  MCV 57.8* 57.8* 66.3*  --   --   --   PLT 301 302 242  --   --   --    Cardiac Enzymes:  Recent Labs Lab 01/03/15 1659 01/03/15 2225 01/04/15 0510  TROPONINI 0.04* <0.03 0.04*   BNP (last 3 results)  Recent Labs  01/01/15 1957  BNP 20.4    ProBNP (last 3 results) No results for input(s): PROBNP in the last 8760 hours.  CBG:  Recent Labs Lab 01/03/15 1231 01/03/15 1713 01/03/15 2149 01/04/15 0757 01/04/15 1207  GLUCAP 112* 92 130* 92 132*    No results found for this or any previous visit (from the past 240 hour(s)).   Studies: Ct Coronary Morp W/cta Cor W/score W/ca W/cm &/or Wo/cm  01/04/2015   ADDENDUM REPORT: 01/04/2015 17:24  CLINICAL DATA:  Chest pain  EXAM: Cardiac CTA  MEDICATIONS: Sub lingual nitro. 4mg  and lopressor 10  mg  TECHNIQUE: The patient was scanned on a Philips 027 slice scanner. Gantry  rotation speed was 270 msecs. Collimation was .70mm. A 100 kV prospective scan was triggered in the descending thoracic aorta at 111 HU's with 5% padding centered around 78% of the R-R interval. Average HR during the scan was 80 bpm. The 3D data set was interpreted on a dedicated work station using MPR, MIP and VRT modes. A total of 80cc of contrast was used.  FINDINGS: Non-cardiac: See separate report from [facility]. No significant findings on limited lung and soft tissue windows.  Calcium Score:  1361 with calcification of LM and all 3 vessels  Coronary Arteries: Right dominant with no anomalies  LM:  No obstructive calcified lesion at ostium  LAD: Less than 50% multiple calcified lesions in proximal and mid vessel  D1:  Small and normal  D2: Normal  Circumflex: Less than 50% calcified disease in proximal and mid vessel  OM1: Large vessel with less than 50% ostial and mid vessel calcific disease  OM2:  Small and normal  RCA: Less than 50% calcified disease in proximal mid and distal vessel  PDA: Normal  PLA:  Normal  IMPRESSION: 1) Calcium score 1361 with calcium in LM and all 3 major coronary vessels 97th percentile for age and sex matched controls  2) Despite severe calcification there does not appear to be obstructive disease. See body of report  Would proceed with GI w/u for severe anemia  F/U with Dr Radford Pax for outpatient myovue which is usually recommended for calcium scores over 1000 Discussed with her  Jenkins Rouge   Electronically Signed   By: Jenkins Rouge M.D.   On: 01/04/2015 17:24   01/04/2015   EXAM: OVER-READ INTERPRETATION  CT CHEST  The following report is an over-read performed by radiologist Dr. Rebekah Chesterfield [facility] on 01/04/2015. This over-read does not include interpretation of cardiac or coronary anatomy or pathology. The coronary calcium score/coronary CTA interpretation by the cardiologist is attached.  COMPARISON:  Chest CT 01/02/2015.  FINDINGS: New small  bilateral pleural effusions layering dependently, with some associated passive atelectasis in the dependent portions of the lower lobes of the lungs bilaterally. Mild diffuse ground-glass attenuation and interlobular septal thickening, suggesting a background of mild interstitial pulmonary edema. No acute consolidative airspace disease. Within the visualized portions of the thorax there are no suspicious appearing pulmonary nodules or masses, there is no pneumothorax, and no lymphadenopathy. Visualized portions  of the upper abdomen are unremarkable. There are no aggressive appearing lytic or blastic lesions noted in the visualized portions of the skeleton.  IMPRESSION: Interval development of mild interstitial pulmonary edema and small bilateral pleural effusions with some passive dependent subsegmental atelectasis in the lower lobes of the lungs bilaterally.  Electronically Signed: By: Vinnie Langton M.D. On: 01/04/2015 16:17    Scheduled Meds: . feeding supplement (ENSURE ENLIVE)  237 mL Oral TID BM  . folic acid  1 mg Oral Daily  . multivitamin with minerals  1 tablet Oral Daily  . nicotine  21 mg Transdermal Daily  . pantoprazole  40 mg Oral Q0600  . banana bag IV 1000 mL   Intravenous Once  . thiamine  100 mg Oral Daily   Or  . thiamine  100 mg Intravenous Daily   Continuous Infusions: . lactated ringers      Active Problems:   Anemia due to GI blood loss   Tobacco abuse   Weight loss, abnormal   GIB (gastrointestinal bleeding)   Anemia   Dyspnea   ETOH abuse   Severe protein-calorie malnutrition   Dysphagia, pharyngoesophageal phase   Chest pain   James [name], [facility] Pager [phone]. If 7PM-7AM, please contact night-coverage at [email or web address], password [facility] 01/04/2015, 5:27 PM  LOS: 3 days

## 2015-01-05 ENCOUNTER — Encounter (HOSPITAL_COMMUNITY): Payer: Self-pay | Admitting: Gastroenterology

## 2015-01-05 ENCOUNTER — Other Ambulatory Visit: Payer: Self-pay | Admitting: Physician Assistant

## 2015-01-05 ENCOUNTER — Encounter (HOSPITAL_COMMUNITY)
Admission: EM | Disposition: A | Discharge status: Home / Self Care | Payer: Self-pay | Source: Home / Self Care | Attending: Internal Medicine

## 2015-01-05 ENCOUNTER — Encounter: Payer: Self-pay | Admitting: Internal Medicine

## 2015-01-05 DIAGNOSIS — K2990 Gastroduodenitis, unspecified, without bleeding: Secondary | ICD-10-CM

## 2015-01-05 DIAGNOSIS — R7989 Other specified abnormal findings of blood chemistry: Secondary | ICD-10-CM

## 2015-01-05 DIAGNOSIS — I2584 Coronary atherosclerosis due to calcified coronary lesion: Principal | ICD-10-CM

## 2015-01-05 DIAGNOSIS — K2970 Gastritis, unspecified, without bleeding: Secondary | ICD-10-CM

## 2015-01-05 DIAGNOSIS — I2510 Atherosclerotic heart disease of native coronary artery without angina pectoris: Secondary | ICD-10-CM

## 2015-01-05 HISTORY — PX: ESOPHAGOGASTRODUODENOSCOPY: SHX5428

## 2015-01-05 LAB — CBC
HEMATOCRIT: 32.8 % — AB (ref 39.0–52.0)
Hemoglobin: 9.9 g/dL — ABNORMAL LOW (ref 13.0–17.0)
MCH: 21.7 pg — ABNORMAL LOW (ref 26.0–34.0)
MCHC: 30.2 g/dL (ref 30.0–36.0)
MCV: 71.9 fL — AB (ref 78.0–100.0)
Platelets: 254 10*3/uL (ref 150–400)
RBC: 4.56 MIL/uL (ref 4.22–5.81)
RDW: 29 % — AB (ref 11.5–15.5)
WBC: 10.1 10*3/uL (ref 4.0–10.5)

## 2015-01-05 LAB — BASIC METABOLIC PANEL
Anion gap: 7 (ref 5–15)
BUN: 7 mg/dL (ref 6–23)
CALCIUM: 8.4 mg/dL (ref 8.4–10.5)
CO2: 26 mmol/L (ref 19–32)
CREATININE: 0.63 mg/dL (ref 0.50–1.35)
Chloride: 100 mmol/L (ref 96–112)
GFR calc non Af Amer: >90 mL/min (ref >90)
Glucose, Bld: 87 mg/dL (ref 70–99)
POTASSIUM: 4.9 mmol/L (ref 3.5–5.1)
Sodium: 133 mmol/L — ABNORMAL LOW (ref 135–145)

## 2015-01-05 LAB — GLUCOSE, CAPILLARY
GLUCOSE-CAPILLARY: 112 mg/dL — AB (ref 70–99)
GLUCOSE-CAPILLARY: 94 mg/dL (ref 70–99)
Glucose-Capillary: 99 mg/dL (ref 70–99)

## 2015-01-05 LAB — PREALBUMIN: PREALBUMIN: 9 mg/dL — AB (ref 21–43)

## 2015-01-05 SURGERY — EGD (ESOPHAGOGASTRODUODENOSCOPY)
Anesthesia: Moderate Sedation

## 2015-01-05 MED ORDER — FERROUS SULFATE 325 (65 FE) MG PO TABS
325.0000 mg | ORAL_TABLET | Freq: Two times a day (BID) | ORAL | Status: DC
  Administered 2015-01-05 (×2): 325 mg via ORAL
  Filled 2015-01-05 (×3): qty 1

## 2015-01-05 MED ORDER — SODIUM CHLORIDE 0.9 % IV SOLN: INTRAVENOUS | Status: DC

## 2015-01-05 MED ORDER — FERROUS SULFATE 325 (65 FE) MG PO TABS: 325.0000 mg | ORAL_TABLET | Freq: Two times a day (BID) | ORAL | Status: DC

## 2015-01-05 MED ORDER — DIPHENHYDRAMINE HCL 50 MG/ML IJ SOLN
INTRAMUSCULAR | Status: DC | PRN
  Administered 2015-01-05: 25 mg via INTRAVENOUS

## 2015-01-05 MED ORDER — FENTANYL CITRATE (PF) 100 MCG/2ML IJ SOLN
INTRAMUSCULAR | Status: DC | PRN
  Administered 2015-01-05 (×2): 25 ug via INTRAVENOUS

## 2015-01-05 MED ORDER — DIPHENHYDRAMINE HCL 50 MG/ML IJ SOLN
INTRAMUSCULAR | Status: AC
  Filled 2015-01-05: qty 1

## 2015-01-05 MED ORDER — FENTANYL CITRATE (PF) 100 MCG/2ML IJ SOLN
INTRAMUSCULAR | Status: AC
  Filled 2015-01-05: qty 2

## 2015-01-05 MED ORDER — MIDAZOLAM HCL 5 MG/5ML IJ SOLN
INTRAMUSCULAR | Status: DC | PRN
  Administered 2015-01-05 (×2): 2 mg via INTRAVENOUS

## 2015-01-05 MED ORDER — BUTAMBEN-TETRACAINE-BENZOCAINE 2-2-14 % EX AERO
INHALATION_SPRAY | CUTANEOUS | Status: DC | PRN
  Administered 2015-01-05: 2 via TOPICAL

## 2015-01-05 MED ORDER — MIDAZOLAM HCL 5 MG/ML IJ SOLN
INTRAMUSCULAR | Status: AC
  Filled 2015-01-05: qty 2

## 2015-01-05 MED ORDER — FOLIC ACID 1 MG PO TABS: 1.0000 mg | ORAL_TABLET | Freq: Every day | ORAL | Status: DC

## 2015-01-05 NOTE — Progress Notes (Signed)
No bleeding source was seen by upper endoscopy. Recommendations per progress note.  Signing off.

## 2015-01-05 NOTE — H&P (View-Only) (Signed)
Daily Rounding Note  01/04/2015, 8:48 AM  LOS: 3 days   SUBJECTIVE:       Hungry.  Tolerating solids, no n/v for >36 hours.  No weakness, no dizziness.  Feels better  OBJECTIVE:         Vital signs in last 24 hours:    Temp:  [97.9 F (36.6 C)-98.8 F (37.1 C)] 98.5 F (36.9 C) (04/28 0521) Pulse Rate:  [61-86] 71 (04/28 0521) Resp:  [14-18] 18 (04/28 0521) BP: (99-125)/(56-66) 99/57 mmHg (04/28 0521) SpO2:  [100 %] 100 % (04/28 0521) Last BM Date: 01/04/15 Filed Weights   01/01/15 1942 01/02/15 0020  Weight: 92 lb 8 oz (41.958 kg) 97 lb 4.8 oz (44.135 kg)   General: looks unwell, cachectic.  Alert, comfortable   Heart: RRR Chest: clear bil.   Abdomen: soft, active BS, NT, ND.    Extremities: no CCE.  Muscle wasting in all limbs.  Neuro/Psych:  Oriented x 3.  Pleasant, relaxed.   Intake/Output from previous day: 04/27 0701 - 04/28 0700 In: 240 [P.O.:240] Out: 350 [Urine:350]  Intake/Output this shift:    Lab Results:  Recent Labs  01/01/15 1957 01/01/15 2304 01/02/15 1020 01/03/15 0802 01/03/15 1840 01/04/15 0800  WBC 9.6 9.2 10.3  --   --   --   HGB 4.9* 4.5* 6.5* 9.6* 10.1* 9.9*  HCT 19.3* 17.7* 23.2* 31.1* 32.6* 32.7*  PLT 301 302 242  --   --   --    BMET  Recent Labs  01/02/15 1020 01/03/15 0629 01/04/15 0510  NA 130* 128* 132*  K 4.0 3.6 4.6  CL 96 98 102  CO2 16* 21 21  GLUCOSE 35* 90 76  BUN 6 5* <5*  CREATININE 0.83 0.58 0.65  CALCIUM 7.9* 7.6* 7.8*   LFT  Recent Labs  01/01/15 2108 01/02/15 1020 01/03/15 0629 01/04/15 0510  PROT 6.9 6.1 5.2* 5.5*  ALBUMIN 2.8* 2.5* 2.2* 2.2*  AST 44* 57* 43* 47*  ALT 25 27 26 27   ALKPHOS 135* 135* 131* 121*  BILITOT 0.8 1.9* 1.2 1.0  BILIDIR 0.2  --   --   --   IBILI 0.6  --   --   --    Cardiac Panel (last 3 results)  Recent Labs  01/03/15 1659 01/03/15 2225 01/04/15 0510  TROPONINI 0.04* <0.03 0.04*    PT/INR  Recent Labs  01/01/15 2108  LABPROT 13.5  INR 1.02   Hepatitis Panel  Recent Labs  01/02/15 1815  HEPBSAG NEGATIVE  HCVAB NEGATIVE  HEPAIGM NON REACTIVE  HEPBIGM NON REACTIVE    Studies/Results: No results found.   Scheduled Meds: . feeding supplement (ENSURE ENLIVE)  237 mL Oral TID BM  . folic acid  1 mg Oral Daily  . multivitamin with minerals  1 tablet Oral Daily  . nicotine  21 mg Transdermal Daily  . pantoprazole (PROTONIX) IV  40 mg Intravenous Q12H  . banana bag IV 1000 mL   Intravenous Once  . thiamine  100 mg Oral Daily   Or  . thiamine  100 mg Intravenous Daily   Continuous Infusions: . sodium chloride 20 mL/hr at 01/03/15 1400  . dextrose 5 % and 0.9% NaCl Stopped (01/03/15 1330)  . lactated ringers     PRN Meds:.LORazepam **OR** LORazepam   ASSESMENT:   * Weight loss, n/v, recurrent microcytic anemia, FOBT + with dark stools last week. Suspect slow, chronic GI  blood loss with possible accelerated losses in recent weeks.  Ferritin 9, iron 12, B12/Folate ok.  Target and teardrop cells on periph smear.  Appropriate response to 4 units PRBCs.  Hx colon tubular adenomatous polyps, one with HGD, 09/2011 Hx esophagitis and distal esophageal stricture 09/2011. Currently c/o dysphagia.  Gastric thickening by CT scan.  IV protonix in place.  N/V resolved, appetite improved  * Alcoholism. Minor elevation of AST and ALK phos. CT appearance of liver, pancreas, biliary tree unremarkable.   * Hyponatremia.  Improved.   *  Chest pain.  Minimal bump on 2 of 3 Troponins.  Await cardiology plans as to investigational studies.    PLAN   *  Cardiac CT today.    *  Plan colon/egd once cardiology testing and clearance obtained.      *  Restart diet.  Once timing of colon/egd set, will convert to clears.   *  Switch to oral Protonix.     Azucena Freed  01/04/2015, 8:48 AM Pager: [phone]

## 2015-01-05 NOTE — Progress Notes (Signed)
Nsg Discharge Note  Admit Date:  01/01/2015 Discharge date: 01/05/2015   James [name] [facility] to be D/C'd Home per MD order.  AVS completed.  Copy for chart, and copy for patient signed, and dated. Patient/caregiver able to verbalize understanding.  Discharge Medication:   Medication List    TAKE these medications        Avanafil 100 MG Tabs  Commonly known as:  STENDRA  Take 100 capsules by mouth 1 day or 1 dose.     ferrous sulfate 325 (65 FE) MG tablet  Take 1 tablet (325 mg total) by mouth 2 (two) times daily with a meal.     folic acid 1 MG tablet  Commonly known as:  FOLVITE  Take 1 tablet (1 mg total) by mouth daily.        Discharge Assessment: Filed Vitals:   01/05/15 1620  BP: 98/48  Pulse: 73  Temp:   Resp: 20   Skin clean, dry and intact without evidence of skin break down, no evidence of skin tears noted. IV catheter discontinued intact. Site without signs and symptoms of complications - no redness or edema noted at insertion site, patient denies c/o pain - only slight tenderness at site.  Dressing with slight pressure applied.  D/c Instructions-Education: Discharge instructions given to patient/family with verbalized understanding. D/c education completed with patient/family including follow up instructions, medication list, d/c activities limitations if indicated, with other d/c instructions as indicated by MD - patient able to verbalize understanding, all questions fully answered. Patient instructed to return to ED, call 911, or call MD for any changes in condition.  Patient escorted via [place], and D/C home via private auto.  Dayle Points, RN 01/05/2015 7:36 PM

## 2015-01-05 NOTE — Interval H&P Note (Signed)
History and Physical Interval Note:  01/05/2015 3:14 PM  James [name]  has presented today for surgery, with the diagnosis of Anemia, FOBT positive, anorexia, weight loss.  The various methods of treatment have been discussed with the patient and family. After consideration of risks, benefits and other options for treatment, the patient has consented to  Procedure(s): ESOPHAGOGASTRODUODENOSCOPY (EGD) (N/A) as a surgical intervention .  The patient's history has been reviewed, patient examined, no change in status, stable for surgery.  I have reviewed the patient's chart and labs.  Questions were answered to the patient's satisfaction.    The recent H&P (dated *01/04/15**) was reviewed, the patient was examined and there is no change in the patients condition since that H&P was completed.   Erskine Emery  01/05/2015, 3:14 PM    Erskine Emery

## 2015-01-05 NOTE — Op Note (Signed)
[facility] [place], [id number]   ENDOSCOPY PROCEDURE REPORT  PATIENT: James [name], James [name]  MR#: [id number] BIRTHDATE: 1956/06/05 , 74  yrs. old GENDER: male ENDOSCOPIST: Inda Castle, MD REFERRED BY: PROCEDURE DATE:  01/05/2015 PROCEDURE:  EGD w/ biopsy ASA CLASS:     Class III INDICATIONS:  anemia. MEDICATIONS: Versed 4 mg IV and Fentanyl 50 mcg IV TOPICAL ANESTHETIC:  DESCRIPTION OF PROCEDURE: After the risks benefits and alternatives of the procedure were thoroughly explained, informed consent was obtained.  The PENTAX GASTOROSCOPE [id number] endoscope was introduced through the mouth and advanced to the second portion of the duodenum , Without limitations.  The instrument was slowly withdrawn as the mucosa was fully examined.    In the gastric body there were enlarged but soft gastric folds. Multiple biopsies were taken.   Except for the findings listed, the EGD was otherwise normal.  Retroflexed views revealed no abnormalities.     The scope was then withdrawn from the patient and the procedure completed.  COMPLICATIONS: There were no immediate complications.  ENDOSCOPIC IMPRESSION: 1.   In the gastric body there were enlarged but soft gastric folds. Multiple biopsies were taken 2.   EGD was otherwise normal  RECOMMENDATIONS: 1.  Await biopsy results 2.  Colonoscopy; if negative Endoscopy  REPEAT EXAM:  eSigned:  Inda Castle, MD 2015-01-05 [phone]    CC:

## 2015-01-05 NOTE — Progress Notes (Signed)
Daily Rounding Note  01/05/2015, 9:42 AM  LOS: 4 days   SUBJECTIVE:       Patient is hungry. He is disappointed that he hasn't had any more solid meals after lunch yesterday.  He was kept nothing by mouth after midnight. Cardiology has cleared him for EGD/colonoscopy.  Last BM was 4/26. Continues to be free of nausea, vomiting. Feels much better when when he was admitted.   OBJECTIVE:         Vital signs in last 24 hours:    Temp:  [98.1 F (36.7 C)-98.5 F (36.9 C)] 98.1 F (36.7 C) (04/29 0520) Pulse Rate:  [68-77] 71 (04/29 0520) Resp:  [16-18] 18 (04/29 0520) BP: (97-100)/(48-66) 98/55 mmHg (04/29 0520) SpO2:  [100 %] 100 % (04/29 0520) Last BM Date: 01/03/15 Filed Weights   01/01/15 1942 01/02/15 0020  Weight: 92 lb 8 oz (41.958 kg) 97 lb 4.8 oz (44.135 kg)   General: Pleasant, cachectic and generally unhealthy appearing. Comfortable   Heart: RRR. No MRG. Chest: Clear bilaterally. No dyspnea or cough. Abdomen: Soft, thin, NT, ND. No masses. Active bowel sounds.  Extremities: no CCE.   Neuro/Psych:  Pleasant, relaxed, cooperative.   Intake/Output from previous day: 04/28 0701 - 04/29 0700 In: 2207 [P.O.:2207] Out: 725 [Urine:725]  Intake/Output this shift: Total I/O In: 0  Out: 450 [Urine:450]  Lab Results:  Recent Labs  01/02/15 1020  01/03/15 1840 01/04/15 0800 01/05/15 0651  WBC 10.3  --   --   --  10.1  HGB 6.5*  < > 10.1* 9.9* 9.9*  HCT 23.2*  < > 32.6* 32.7* 32.8*  PLT 242  --   --   --  PENDING  < > = values in this interval not displayed. BMET  Recent Labs  01/03/15 0629 01/04/15 0510 01/05/15 0651  NA 128* 132* 133*  K 3.6 4.6 4.9  CL 98 102 100  CO2 _0 GLUCOSE 90 76 87  BUN 5* <5* 7  CREATININE 0.58 0.65 0.63  CALCIUM 7.6* 7.8* 8.4   LFT  Recent Labs  01/02/15 1020 01/03/15 0629 01/04/15 0510  PROT 6.1 5.2* 5.5*  ALBUMIN 2.5* 2.2* 2.2*  AST 57* 43* 47*  ALT  _1 ALKPHOS 135* 131* 121*  BILITOT 1.9* 1.2 1.0   Hepatitis Panel  Recent Labs  01/02/15 1815  HEPBSAG NEGATIVE  HCVAB NEGATIVE  HEPAIGM NON REACTIVE  HEPBIGM NON REACTIVE    Studies/Results: Ct Coronary Morp W/cta Cor W/score W/ca W/cm &/or Wo/cm  01/04/2015   ADDENDUM REPORT: 01/04/2015 17:24  CLINICAL DATA:  Chest pain  EXAM: Cardiac CTA  MEDICATIONS: Sub lingual nitro. 36m and lopressor 10  mg  TECHNIQUE: The patient was scanned on a Philips 2403slice scanner. Gantry rotation speed was 270 msecs. Collimation was .956m A 100 kV prospective scan was triggered in the descending thoracic aorta at 111 HU's with 5% padding centered around 78% of the R-R interval. Average HR during the scan was 80 bpm. The 3D data set was interpreted on a dedicated work station using MPR, MIP and VRT modes. A total of 80cc of contrast was used.  FINDINGS: [facility]cardiac: [facility] No significant findings on limited lung and soft tissue windows.  Calcium Score:  1361 with calcification of LM and all 3 vessels  Coronary Arteries: Right dominant with no anomalies  LM:  No obstructive calcified lesion at  ostium  LAD: Less than 50% multiple calcified lesions in proximal and mid vessel  D1:  Small and normal  D2: Normal  Circumflex: Less than 50% calcified disease in proximal and mid vessel  OM1: Large vessel with less than 50% ostial and mid vessel calcific disease  OM2:  Small and normal  RCA: Less than 50% calcified disease in proximal mid and distal vessel  PDA: Normal  PLA:  Normal  IMPRESSION: 1) Calcium score 1361 with calcium in LM and all 3 major coronary vessels 97th percentile for age and sex matched controls  2) Despite severe calcification there does not appear to be obstructive disease. See body of report  Would proceed with GI w/u for severe anemia  F/U with Dr Radford Pax for outpatient myovue which is usually recommended for calcium scores over 1000 Discussed with her   Jenkins Rouge   Electronically Signed   By: Jenkins Rouge M.D.   On: 01/04/2015 17:24   01/04/2015   EXAM: OVER-READ INTERPRETATION  CT CHEST  The following report is an over-read performed by radiologist Dr. Rebekah Chesterfield [facility] on 01/04/2015. This over-read does not include interpretation of cardiac or coronary anatomy or pathology. The coronary calcium score/coronary CTA interpretation by the cardiologist is attached.  COMPARISON:  Chest CT 01/02/2015.  FINDINGS: New small bilateral pleural effusions layering dependently, with some associated passive atelectasis in the dependent portions of the lower lobes of the lungs bilaterally. Mild diffuse ground-glass attenuation and interlobular septal thickening, suggesting a background of mild interstitial pulmonary edema. No acute consolidative airspace disease. Within the visualized portions of the thorax there are no suspicious appearing pulmonary nodules or masses, there is no pneumothorax, and no lymphadenopathy. Visualized portions of the upper abdomen are unremarkable. There are no aggressive appearing lytic or blastic lesions noted in the visualized portions of the skeleton.  IMPRESSION: Interval development of mild interstitial pulmonary edema and small bilateral pleural effusions with some passive dependent subsegmental atelectasis in the lower lobes of the lungs bilaterally.  Electronically Signed: By: Vinnie Langton M.D. On: 01/04/2015 16:17   Scheduled Meds: . feeding supplement (ENSURE ENLIVE)  237 mL Oral TID BM  . folic acid  1 mg Oral Daily  . multivitamin with minerals  1 tablet Oral Daily  . nicotine  21 mg Transdermal Daily  . pantoprazole  40 mg Oral Q0600  . banana bag IV 1000 mL   Intravenous Once  . thiamine  100 mg Oral Daily   Or  . thiamine  100 mg Intravenous Daily   Continuous Infusions: . lactated ringers     PRN Meds:.  ASSESMENT:   * Weight loss, n/v: resolved with PPI and cessation ETOH. Hx  esophagitis and distal esophageal stricture 09/2011. Currently c/o dysphagia.  Gastric thickening by CT scan.  Hx colon tubular adenomatous polyps, one with HGD, 09/2011 Not on PPI at home.  Once daily Protonix in place. N/V resolved, appetite improved.  Suspect he will gain weight if he can comply with PPI and with ETOH abstinence.   *  Recurrent microcytic anemia, FOBT + with dark stools last week. Suspect slow, chronic GI blood loss with possible accelerated losses in recent weeks.  Ferritin 9, iron 12, B12/Folate ok. Target and teardrop cells on periph smear.  Appropriate response to 4 units PRBCs.    * Alcoholism. Minor elevation of AST and ALK phos. CT appearance of liver, pancreas, biliary tree unremarkable.   * Hyponatremia. Improved.   * Exertional  chest/shoulder pain. Minimal bump on 2 of 3 Troponins felt due to demand ischemia in setting of profound anemia. 01/04/15 cardiac CT scan with elevated calcium score and calcium evident in left main and all 3 major coronary vessels but no obvious obstructive disease. Plan is for outpatient nuclear stress test. Cardiology feels patient is safe to proceed with GI workup.   PLAN   *  Upper endoscopy set for this afternoon approximately 4 PM. Has colonoscopy set at [facility] 6/8, preop visit 6/1, at GI office. Pt understands importance of undergoing colonoscopy, given hx of adenoma with HGD in  09/2011.  Reliable phone number for contacting patient is through his daughter Landry Dyke whose cell phone number is [place] set for  01/17/15  *  Would strongly consider dosing him with parenteral iron at least once before he discharges. But will start po iron BID for now, will need to hold it for the week before the colonoscopy.   *  Pt also seems to be embracing ETOH cessation and abstinence.  Printed multipage list of [place] meetings and gave it to the pt.     Azucena Freed  01/05/2015, 9:42 AM Pager: [phone]

## 2015-01-05 NOTE — Progress Notes (Signed)
    Subjective: No CP.  Prior to admission he would develop dyspnea and shoulder/chest pain with exertion.   Objective: Vital signs in last 24 hours: Temp:  [98.1 F (36.7 C)-98.5 F (36.9 C)] 98.1 F (36.7 C) (04/29 0520) Pulse Rate:  [68-77] 71 (04/29 0520) Resp:  [16-18] 18 (04/29 0520) BP: (97-100)/(48-66) 98/55 mmHg (04/29 0520) SpO2:  [100 %] 100 % (04/29 0520) Last BM Date: 01/03/15  Intake/Output from previous day: 04/28 0701 - 04/29 0700 In: 2207 [P.O.:2207] Out: 725 [Urine:725] Intake/Output this shift:    Medications Scheduled Meds: . feeding supplement (ENSURE ENLIVE)  237 mL Oral TID BM  . folic acid  1 mg Oral Daily  . multivitamin with minerals  1 tablet Oral Daily  . nicotine  21 mg Transdermal Daily  . pantoprazole  40 mg Oral Q0600  . banana bag IV 1000 mL   Intravenous Once  . thiamine  100 mg Oral Daily   Or  . thiamine  100 mg Intravenous Daily   Continuous Infusions: . lactated ringers     PRN Meds:.  PE: General appearance: alert, cooperative and Very thin Lungs: clear to auscultation bilaterally Heart: regular rate and rhythm, S1, S2 normal, no murmur, click, rub or gallop Abdomen: +BS, nontender Extremities: No LEE Pulses: 2+ and symmetric Skin: Warm and dry Neurologic: Grossly normal  Lab Results:   Recent Labs  01/02/15 1020  01/03/15 1840 01/04/15 0800 01/05/15 0651  WBC 10.3  --   --   --  10.1  HGB 6.5*  < > 10.1* 9.9* 9.9*  HCT 23.2*  < > 32.6* 32.7* 32.8*  PLT 242  --   --   --  PENDING  < > = values in this interval not displayed. BMET  Recent Labs  01/03/15 0629 01/04/15 0510 01/05/15 0651  NA 128* 132* 133*  K 3.6 4.6 4.9  CL 98 102 100  CO2 21 21 26   GLUCOSE 90 76 87  BUN 5* <5* 7  CREATININE 0.58 0.65 0.63  CALCIUM 7.6* 7.8* 8.4   PT/INR No results for input(s): LABPROT, INR in the last 72 hours.  Assessment/Plan 59yo AAM with a history of anemia, cachexia, tobacco abuse, ETOH abuse, AI,  interstitial lung disease who presented with progressive SOB over the past month.  Active Problems:   Anemia due to GI blood loss   Tobacco abuse   Weight loss, abnormal   GIB (gastrointestinal bleeding)   Anemia   Dyspnea   ETOH abuse   Severe protein-calorie malnutrition   Dysphagia, pharyngoesophageal phase   Chest pain   Absolute anemia   Bleeding gastrointestinal   Marijuana abuse   Coronary calcium - >1000 score on CT. Will get NUC as outpatient. Will set up in 2-3 weeks. Can proceed with GI procedure.   Troponin elevation - demand in setting of Hg 4   Troponin 0.04 in the setting of severe anemia(Hgb 4.5 on 4/26 and 6.5 after four units red cells.)  Stable now at 9.9.     Echo: EF 55-60% with normal wall motion and G1DD.    Will sign off. Please call if any ?   LOS: 4 days    Candee Furbish, MD

## 2015-01-05 NOTE — Discharge Summary (Signed)
Physician Discharge Summary  James [name] Baccari JJO:[id number] DOB: 09-Sep-1955 DOA: 01/01/2015  PCP: Wyatt Haste, MD  Admit date: 01/01/2015 Discharge date: 01/05/2015  Time spent: 20 minutes  Recommendations for Outpatient Follow-up:  1. Follow up with PCP in 1-2 weeks 2. Follow up with colonoscopy as scheduled 3. Follow up with stress test as scheduled  Discharge Diagnoses:  Active Problems:   Anemia due to GI blood loss   Tobacco abuse   Weight loss, abnormal   GIB (gastrointestinal bleeding)   Anemia   Dyspnea   ETOH abuse   Severe protein-calorie malnutrition   Dysphagia, pharyngoesophageal phase   Chest pain   Absolute anemia   Bleeding gastrointestinal   Marijuana abuse   Gastritis and gastroduodenitis   Discharge Condition: Stable  Diet recommendation: heart healthy  Filed Weights   01/01/15 1942 01/02/15 0020  Weight: 41.958 kg (92 lb 8 oz) 44.135 kg (97 lb 4.8 oz)    History of present illness:  Please see admit h and p from 4/26 for details. Briefly, pt presents with anemia and sob with hgb of 4.9 and black appearing stools. The patient was admitted for further work up.  Hospital Course:  1. Acute blood loss anemia 1. GI  wasconsulted 2. The patient underwent EGD on 4/29 with no evidence of bleeding 3. Pt was recommended to continue on PO iron 4. Pt is scheduled for outpt colonoscopy on 6/1 5. Hemoglobin remained stable at around 10 since receiving prbc transfusion on admit 2. Chest pain 1. Likely secondary to demand mismatch 2. Cardiology was consulted 3. Pt is s/p cardiac CT with >1000 coronary calcium score on CT 4. Cardiology recommends NUC as outpatient, scheduled 5. Patient remained pain free 3. Interstitial lung disease 1. Remained stable 2. Followed by Pulmonary as outpatient 4. ETOH abuse and tobacco abuse 1. On CIWA 2. Nicotine patch 5. Severe protein calorie malnutrition 1. Nutrition consulted 6. Unintentional wt loss 1. CEA  and CA-125 both unremarkable 7. Hyponatremia 1. Remained stable 2. Cont to monitor 8. DVT prophylaxis 1. SCD's while inpatient 9. Marijuana abuse 1. Cessation done at bedside 2. Pt reports smoking to "get high" 3. Pt reminded marijuana is illegal and should stop  Procedures:  EGD 4/29  Cardiac CT  Consultations:  Cardiology  GI  Discharge Exam: Filed Vitals:   01/05/15 1600 01/05/15 1607 01/05/15 1610 01/05/15 1620  BP: 92/49 99/51 99/51  98/48  Pulse: 78 76 77 73  Temp:      TempSrc:      Resp: 19 22 19 20   Height:      Weight:      SpO2: 100% 100% 100% 100%    General: Awake, in nad Cardiovascular: regular, s1, s2 Respiratory: normal resp effort, no wheezing  Discharge Instructions     Medication List    TAKE these medications        Avanafil 100 MG Tabs  Commonly known as:  STENDRA  Take 100 capsules by mouth 1 day or 1 dose.     ferrous sulfate 325 (65 FE) MG tablet  Take 1 tablet (325 mg total) by mouth 2 (two) times daily with a meal.     folic acid 1 MG tablet  Commonly known as:  FOLVITE  Take 1 tablet (1 mg total) by mouth daily.       No Known Allergies Follow-up Information    Follow up with Stress test On 01/17/2015.   Why:  11:45AM   Contact information:  [place] [place] [address] [phone]      Follow up with [facility] On 02/07/2015.   Specialty:  Gastroenterology   Why:   at 9 AM: nurse visit to go over instructions for colonoscopy.  bring all your medicines and your insurance card with you.     Contact information:   [address] [place] [address] [phone]      Follow up with [facility] On 02/14/2015.   Specialty:  Gastroenterology   Why:  this is date of colonoscopy.  arrive at 12:30 for 1:30 procedure with Dr Henrene Pastor.    Contact information:   [place] [address] [phone]      Follow  up with Wyatt Haste, MD. Schedule an appointment as soon as possible for a visit in 1 week.   Specialty:  Family Medicine   Why:  Hospital follow up   Contact information:   [place] [address] [phone]        The results of significant diagnostics from this hospitalization (including imaging, microbiology, ancillary and laboratory) are listed below for reference.    Significant Diagnostic Studies: Ct Chest W Contrast  01/02/2015   CLINICAL DATA:  Acute onset of shortness of breath and difficulty swallowing. Weight loss. Initial encounter.  EXAM: CT CHEST, ABDOMEN, AND PELVIS WITH CONTRAST  TECHNIQUE: Multidetector CT imaging of the chest, abdomen and pelvis was performed following the standard protocol during bolus administration of intravenous contrast.  CONTRAST:  24mL OMNIPAQUE IOHEXOL 300 MG/ML  SOLN  COMPARISON:  CTA of the chest performed 10/01/2011  FINDINGS: CT CHEST FINDINGS  The previously noted opacity at the right lung base appears to have reflected an impacted bronchiole. This has since resolved. The lungs are clear bilaterally. No focal consolidation, pleural effusion or pneumothorax is seen. No masses are identified.  The mediastinum is unremarkable in appearance. No mediastinal lymphadenopathy is seen. Diffuse coronary artery calcifications are noted. No pericardial effusion is identified. The ascending thoracic aorta is borderline normal in caliber. The great vessels are grossly unremarkable in appearance.  The thyroid gland is unremarkable. No axillary lymphadenopathy is seen.  No acute osseous abnormalities are identified.  CT ABDOMEN AND PELVIS FINDINGS  There is somewhat unusual marked gastric wall thickening along the greater curvature of the stomach. This may reflect some degree of underlying acute gastritis, though malignancy is also a concern. Endoscopy would be helpful for further evaluation, as deemed clinically appropriate.  Somewhat  prominent surrounding gastric vessels are noted.  The liver and spleen are unremarkable in appearance. The gallbladder is within normal limits. The pancreas and adrenal glands are unremarkable.  The kidneys are unremarkable in appearance. There is no evidence of hydronephrosis. No renal or ureteral stones are seen. No perinephric stranding is appreciated.  No free fluid is identified. The small bowel is unremarkable in appearance. The stomach is within normal limits. No acute vascular abnormalities are seen. Scattered calcification is noted along the abdominal aorta and its branches.  The appendix is not definitely characterized; there is no evidence of appendicitis. The colon is grossly unremarkable in appearance. Trace stranding along the descending colon is nonspecific and likely chronic in nature.  The bladder is mildly distended and grossly unremarkable. The prostate remains normal in size, with scattered calcification. No inguinal lymphadenopathy is seen.  No acute osseous abnormalities are identified.  IMPRESSION: 1. Somewhat unusual marked gastric wall thickening along the greater  curvature of the stomach. This may reflect underlying acute gastritis, though malignancy is also a concern. Endoscopy would be helpful for further evaluation, as deemed clinically appropriate. 2. Lungs clear bilaterally. 3. Diffuse coronary artery calcifications seen. 4. Scattered calcification along the abdominal aorta and its branches. 5. Colon grossly unremarkable in appearance.   Electronically Signed   By: Garald Balding M.D.   On: 01/02/2015 03:21   Ct Abdomen Pelvis W Contrast  01/02/2015   CLINICAL DATA:  Acute onset of shortness of breath and difficulty swallowing. Weight loss. Initial encounter.  EXAM: CT CHEST, ABDOMEN, AND PELVIS WITH CONTRAST  TECHNIQUE: Multidetector CT imaging of the chest, abdomen and pelvis was performed following the standard protocol during bolus administration of intravenous contrast.   CONTRAST:  22mL OMNIPAQUE IOHEXOL 300 MG/ML  SOLN  COMPARISON:  CTA of the chest performed 10/01/2011  FINDINGS: CT CHEST FINDINGS  The previously noted opacity at the right lung base appears to have reflected an impacted bronchiole. This has since resolved. The lungs are clear bilaterally. No focal consolidation, pleural effusion or pneumothorax is seen. No masses are identified.  The mediastinum is unremarkable in appearance. No mediastinal lymphadenopathy is seen. Diffuse coronary artery calcifications are noted. No pericardial effusion is identified. The ascending thoracic aorta is borderline normal in caliber. The great vessels are grossly unremarkable in appearance.  The thyroid gland is unremarkable. No axillary lymphadenopathy is seen.  No acute osseous abnormalities are identified.  CT ABDOMEN AND PELVIS FINDINGS  There is somewhat unusual marked gastric wall thickening along the greater curvature of the stomach. This may reflect some degree of underlying acute gastritis, though malignancy is also a concern. Endoscopy would be helpful for further evaluation, as deemed clinically appropriate.  Somewhat prominent surrounding gastric vessels are noted.  The liver and spleen are unremarkable in appearance. The gallbladder is within normal limits. The pancreas and adrenal glands are unremarkable.  The kidneys are unremarkable in appearance. There is no evidence of hydronephrosis. No renal or ureteral stones are seen. No perinephric stranding is appreciated.  No free fluid is identified. The small bowel is unremarkable in appearance. The stomach is within normal limits. No acute vascular abnormalities are seen. Scattered calcification is noted along the abdominal aorta and its branches.  The appendix is not definitely characterized; there is no evidence of appendicitis. The colon is grossly unremarkable in appearance. Trace stranding along the descending colon is nonspecific and likely chronic in nature.  The  bladder is mildly distended and grossly unremarkable. The prostate remains normal in size, with scattered calcification. No inguinal lymphadenopathy is seen.  No acute osseous abnormalities are identified.  IMPRESSION: 1. Somewhat unusual marked gastric wall thickening along the greater curvature of the stomach. This may reflect underlying acute gastritis, though malignancy is also a concern. Endoscopy would be helpful for further evaluation, as deemed clinically appropriate. 2. Lungs clear bilaterally. 3. Diffuse coronary artery calcifications seen. 4. Scattered calcification along the abdominal aorta and its branches. 5. Colon grossly unremarkable in appearance.   Electronically Signed   By: Garald Balding M.D.   On: 01/02/2015 03:21   Ct Coronary Morp W/cta Cor W/score W/ca W/cm &/or Wo/cm  01/04/2015   ADDENDUM REPORT: 01/04/2015 17:24  CLINICAL DATA:  Chest pain  EXAM: Cardiac CTA  MEDICATIONS: Sub lingual nitro. 4mg  and lopressor 10  mg  TECHNIQUE: The patient was scanned on a [employer]ips 588 slice scanner. Gantry rotation speed was 270 msecs. Collimation was .60mm. A 100 kV prospective  scan was triggered in the descending thoracic aorta at 111 HU's with 5% padding centered around 78% of the R-R interval. Average HR during the scan was 80 bpm. The 3D data set was interpreted on a dedicated work station using MPR, MIP and VRT modes. A total of 80cc of contrast was used.  FINDINGS: Non-cardiac: See separate report from [facility]. No significant findings on limited lung and soft tissue windows.  Calcium Score:  1361 with calcification of LM and all 3 vessels  Coronary Arteries: Right dominant with no anomalies  LM:  No obstructive calcified lesion at ostium  LAD: Less than 50% multiple calcified lesions in proximal and mid vessel  D1:  Small and normal  D2: Normal  Circumflex: Less than 50% calcified disease in proximal and mid vessel  OM1: Large vessel with less than 50% ostial and mid vessel calcific  disease  OM2:  Small and normal  RCA: Less than 50% calcified disease in proximal mid and distal vessel  PDA: Normal  PLA:  Normal  IMPRESSION: 1) Calcium score 1361 with calcium in LM and all 3 major coronary vessels 97th percentile for age and sex matched controls  2) Despite severe calcification there does not appear to be obstructive disease. See body of report  Would proceed with GI w/u for severe anemia  F/U with Dr Radford Pax for outpatient myovue which is usually recommended for calcium scores over 1000 Discussed with her  James [name]   Electronically Signed   By: James [name] M.D.   On: 01/04/2015 17:24   01/04/2015   EXAM: OVER-READ INTERPRETATION  CT CHEST  The following report is an over-read performed by radiologist Dr. Rebekah Chesterfield [facility] on 01/04/2015. This over-read does not include interpretation of cardiac or coronary anatomy or pathology. The coronary calcium score/coronary CTA interpretation by the cardiologist is attached.  COMPARISON:  Chest CT 01/02/2015.  FINDINGS: New small bilateral pleural effusions layering dependently, with some associated passive atelectasis in the dependent portions of the lower lobes of the lungs bilaterally. Mild diffuse ground-glass attenuation and interlobular septal thickening, suggesting a background of mild interstitial pulmonary edema. No acute consolidative airspace disease. Within the visualized portions of the thorax there are no suspicious appearing pulmonary nodules or masses, there is no pneumothorax, and no lymphadenopathy. Visualized portions of the upper abdomen are unremarkable. There are no aggressive appearing lytic or blastic lesions noted in the visualized portions of the skeleton.  IMPRESSION: Interval development of mild interstitial pulmonary edema and small bilateral pleural effusions with some passive dependent subsegmental atelectasis in the lower lobes of the lungs bilaterally.  Electronically Signed: By: Vinnie Langton M.D. On: 01/04/2015 16:17    Microbiology: No results found for this or any previous visit (from the past 240 hour(s)).   Labs: Basic Metabolic Panel:  Recent Labs Lab 01/01/15 1957 01/02/15 1020 01/02/15 1630 01/03/15 0629 01/04/15 0510 01/05/15 0651  NA 132* 130*  --  128* 132* 133*  K 4.2 4.0  --  3.6 4.6 4.9  CL 97 96  --  98 102 100  CO2 19 16*  --  21 21 26   GLUCOSE 69* 35*  --  90 76 87  BUN 6 6  --  5* <5* 7  CREATININE 0.68 0.83  --  0.58 0.65 0.63  CALCIUM 8.3* 7.9*  --  7.6* 7.8* 8.4  MG  --   --  2.0  --   --   --  Liver Function Tests:  Recent Labs Lab 01/01/15 2108 01/02/15 1020 01/03/15 0629 01/04/15 0510  AST 44* 57* 43* 47*  ALT 25 27 26 27   ALKPHOS 135* 135* 131* 121*  BILITOT 0.8 1.9* 1.2 1.0  PROT 6.9 6.1 5.2* 5.5*  ALBUMIN 2.8* 2.5* 2.2* 2.2*   No results for input(s): LIPASE, AMYLASE in the last 168 hours. No results for input(s): AMMONIA in the last 168 hours. CBC:  Recent Labs Lab 01/01/15 1957 01/01/15 2304 01/02/15 1020 01/03/15 0802 01/03/15 1840 01/04/15 0800 01/05/15 0651  WBC 9.6 9.2 10.3  --   --   --  10.1  NEUTROABS  --  6.6 8.3*  --   --   --   --   HGB 4.9* 4.5* 6.5* 9.6* 10.1* 9.9* 9.9*  HCT 19.3* 17.7* 23.2* 31.1* 32.6* 32.7* 32.8*  MCV 57.8* 57.8* 66.3*  --   --   --  71.9*  PLT 301 302 242  --   --   --  254   Cardiac Enzymes:  Recent Labs Lab 01/03/15 1659 01/03/15 2225 01/04/15 0510  TROPONINI 0.04* <0.03 0.04*   BNP: BNP (last 3 results)  Recent Labs  01/01/15 1957  BNP 20.4    ProBNP (last 3 results) No results for input(s): PROBNP in the last 8760 hours.  CBG:  Recent Labs Lab 01/04/15 1207 01/04/15 1758 01/04/15 2150 01/05/15 0806 01/05/15 1203  GLUCAP 132* 101* 125* 99 112*    Signed:  Betty Daidone K  [facility] 01/05/2015, 4:58 PM

## 2015-01-08 ENCOUNTER — Telehealth: Payer: Self-pay | Admitting: Family Medicine

## 2015-01-08 ENCOUNTER — Encounter (HOSPITAL_COMMUNITY): Payer: Self-pay | Admitting: Gastroenterology

## 2015-01-08 NOTE — Telephone Encounter (Signed)
Left message for pt to call per JCL needs an appt for hospital follow up kds 01/08/2015

## 2015-01-09 ENCOUNTER — Encounter: Payer: Self-pay | Admitting: Gastroenterology

## 2015-01-10 ENCOUNTER — Other Ambulatory Visit: Payer: Self-pay

## 2015-01-10 MED ORDER — AMOXICILL-CLARITHRO-LANSOPRAZ PO MISC: Freq: Two times a day (BID) | ORAL | Status: DC

## 2015-01-16 ENCOUNTER — Telehealth (HOSPITAL_COMMUNITY): Payer: Self-pay

## 2015-01-16 NOTE — Telephone Encounter (Signed)
Left message on voicemail in reference to upcoming appointment scheduled for 01-17-2015. Phone number given for [name] call back so details instructions can be given. James [name], James [name]

## 2015-01-17 ENCOUNTER — Encounter (HOSPITAL_COMMUNITY): Payer: BC Managed Care – PPO

## 2015-01-25 ENCOUNTER — Telehealth (HOSPITAL_COMMUNITY): Payer: Self-pay

## 2015-01-25 NOTE — Telephone Encounter (Signed)
Left message on voicemail in reference to upcoming appointment scheduled for 01-26-2015. Phone number given for a call back so details instructions can be given. Oletta Lamas, Zylee Marchiano A

## 2015-01-26 ENCOUNTER — Ambulatory Visit (HOSPITAL_COMMUNITY): Payer: BC Managed Care – PPO | Attending: Cardiology

## 2015-01-26 DIAGNOSIS — R9439 Abnormal result of other cardiovascular function study: Secondary | ICD-10-CM | POA: Diagnosis not present

## 2015-01-26 DIAGNOSIS — I2584 Coronary atherosclerosis due to calcified coronary lesion: Secondary | ICD-10-CM | POA: Diagnosis not present

## 2015-01-26 DIAGNOSIS — R0609 Other forms of dyspnea: Secondary | ICD-10-CM | POA: Diagnosis not present

## 2015-01-26 DIAGNOSIS — R079 Chest pain, unspecified: Secondary | ICD-10-CM | POA: Diagnosis not present

## 2015-01-26 DIAGNOSIS — R55 Syncope and collapse: Secondary | ICD-10-CM | POA: Insufficient documentation

## 2015-01-26 DIAGNOSIS — I2510 Atherosclerotic heart disease of native coronary artery without angina pectoris: Secondary | ICD-10-CM | POA: Diagnosis not present

## 2015-01-26 LAB — MYOCARDIAL PERFUSION IMAGING
CHL CUP NUCLEAR SDS: 3
CHL CUP NUCLEAR SRS: 2
CHL CUP NUCLEAR SSS: 5
CHL CUP STRESS STAGE 1 DBP: 66 mmHg
CHL CUP STRESS STAGE 1 GRADE: 0 %
CHL CUP STRESS STAGE 1 HR: 65 {beats}/min
CHL CUP STRESS STAGE 1 SPEED: 0 mph
CHL CUP STRESS STAGE 2 HR: 64 {beats}/min
CHL CUP STRESS STAGE 3 SPEED: 0 mph
CHL CUP STRESS STAGE 5 DBP: 67 mmHg
CHL CUP STRESS STAGE 5 SBP: 133 mmHg
CHL CUP STRESS STAGE 5 SPEED: 0 mph
CHL CUP STRESS STAGE 6 SPEED: 0 mph
CSEPPBP: 138 mmHg
Estimated workload: 1 METS
LHR: 0.25
LV sys vol: 69 mL
LVDIAVOL: 158 mL
NUC STRESS TID: 1
Nuc Stress EF: 56 %
Peak HR: 90 {beats}/min
Percent of predicted max HR: 54 %
Rest HR: 99 {beats}/min
Stage 1 SBP: 136 mmHg
Stage 2 Grade: 0 %
Stage 2 Speed: 0 mph
Stage 3 DBP: 65 mmHg
Stage 3 Grade: 0 %
Stage 3 HR: 96 {beats}/min
Stage 3 SBP: 128 mmHg
Stage 4 DBP: 65 mmHg
Stage 4 Grade: 0 %
Stage 4 HR: 90 {beats}/min
Stage 4 SBP: 138 mmHg
Stage 4 Speed: 0 mph
Stage 5 Grade: 0 %
Stage 5 HR: 96 {beats}/min
Stage 6 DBP: 64 mmHg
Stage 6 Grade: 0 %
Stage 6 HR: 81 {beats}/min
Stage 6 SBP: 128 mmHg

## 2015-01-26 MED ORDER — TECHNETIUM TC 99M SESTAMIBI GENERIC - CARDIOLITE
11.0000 | Freq: Once | INTRAVENOUS | Status: AC | PRN
  Administered 2015-01-26: 11 via INTRAVENOUS

## 2015-01-26 MED ORDER — REGADENOSON 0.4 MG/5ML IV SOLN
0.4000 mg | Freq: Once | INTRAVENOUS | Status: AC
  Administered 2015-01-26: 0.4 mg via INTRAVENOUS

## 2015-01-26 MED ORDER — TECHNETIUM TC 99M SESTAMIBI GENERIC - CARDIOLITE
33.0000 | Freq: Once | INTRAVENOUS | Status: AC | PRN
  Administered 2015-01-26: 33 via INTRAVENOUS

## 2015-01-29 ENCOUNTER — Encounter (HOSPITAL_COMMUNITY): Payer: Self-pay

## 2015-01-29 NOTE — Progress Notes (Unsigned)
Error

## 2015-02-14 ENCOUNTER — Encounter: Payer: BC Managed Care – PPO | Admitting: Internal Medicine

## 2015-03-01 ENCOUNTER — Encounter: Payer: BC Managed Care – PPO | Admitting: Internal Medicine

## 2015-08-20 ENCOUNTER — Inpatient Hospital Stay (HOSPITAL_COMMUNITY)
Admission: EM | Admit: 2015-08-20 | Discharge: 2015-08-26 | DRG: 377 | Disposition: A | Discharge status: Home / Self Care | Payer: BC Managed Care – PPO | Attending: Internal Medicine | Admitting: Internal Medicine

## 2015-08-20 ENCOUNTER — Encounter (HOSPITAL_COMMUNITY): Payer: Self-pay | Admitting: *Deleted

## 2015-08-20 ENCOUNTER — Emergency Department (HOSPITAL_COMMUNITY): Payer: BC Managed Care – PPO

## 2015-08-20 DIAGNOSIS — K449 Diaphragmatic hernia without obstruction or gangrene: Secondary | ICD-10-CM | POA: Diagnosis present

## 2015-08-20 DIAGNOSIS — K259 Gastric ulcer, unspecified as acute or chronic, without hemorrhage or perforation: Secondary | ICD-10-CM | POA: Diagnosis present

## 2015-08-20 DIAGNOSIS — K219 Gastro-esophageal reflux disease without esophagitis: Secondary | ICD-10-CM | POA: Diagnosis present

## 2015-08-20 DIAGNOSIS — R64 Cachexia: Secondary | ICD-10-CM | POA: Diagnosis present

## 2015-08-20 DIAGNOSIS — K648 Other hemorrhoids: Secondary | ICD-10-CM | POA: Diagnosis present

## 2015-08-20 DIAGNOSIS — K760 Fatty (change of) liver, not elsewhere classified: Secondary | ICD-10-CM | POA: Diagnosis present

## 2015-08-20 DIAGNOSIS — K922 Gastrointestinal hemorrhage, unspecified: Principal | ICD-10-CM | POA: Diagnosis present

## 2015-08-20 DIAGNOSIS — E876 Hypokalemia: Secondary | ICD-10-CM | POA: Diagnosis present

## 2015-08-20 DIAGNOSIS — I351 Nonrheumatic aortic (valve) insufficiency: Secondary | ICD-10-CM | POA: Diagnosis present

## 2015-08-20 DIAGNOSIS — J849 Interstitial pulmonary disease, unspecified: Secondary | ICD-10-CM | POA: Diagnosis present

## 2015-08-20 DIAGNOSIS — B9681 Helicobacter pylori [H. pylori] as the cause of diseases classified elsewhere: Secondary | ICD-10-CM | POA: Diagnosis present

## 2015-08-20 DIAGNOSIS — R634 Abnormal weight loss: Secondary | ICD-10-CM | POA: Diagnosis not present

## 2015-08-20 DIAGNOSIS — D500 Iron deficiency anemia secondary to blood loss (chronic): Secondary | ICD-10-CM | POA: Diagnosis present

## 2015-08-20 DIAGNOSIS — I248 Other forms of acute ischemic heart disease: Secondary | ICD-10-CM | POA: Diagnosis present

## 2015-08-20 DIAGNOSIS — E43 Unspecified severe protein-calorie malnutrition: Secondary | ICD-10-CM | POA: Diagnosis present

## 2015-08-20 DIAGNOSIS — F1010 Alcohol abuse, uncomplicated: Secondary | ICD-10-CM | POA: Diagnosis present

## 2015-08-20 DIAGNOSIS — F1210 Cannabis abuse, uncomplicated: Secondary | ICD-10-CM | POA: Diagnosis present

## 2015-08-20 DIAGNOSIS — D649 Anemia, unspecified: Secondary | ICD-10-CM

## 2015-08-20 DIAGNOSIS — Z720 Tobacco use: Secondary | ICD-10-CM | POA: Diagnosis present

## 2015-08-20 DIAGNOSIS — Z681 Body mass index (BMI) 19 or less, adult: Secondary | ICD-10-CM

## 2015-08-20 DIAGNOSIS — D62 Acute posthemorrhagic anemia: Secondary | ICD-10-CM | POA: Diagnosis present

## 2015-08-20 DIAGNOSIS — K2920 Alcoholic gastritis without bleeding: Secondary | ICD-10-CM | POA: Diagnosis present

## 2015-08-20 DIAGNOSIS — F17210 Nicotine dependence, cigarettes, uncomplicated: Secondary | ICD-10-CM | POA: Diagnosis present

## 2015-08-20 HISTORY — DX: Melena: K92.1

## 2015-08-20 HISTORY — DX: Chest pain, unspecified: R07.9

## 2015-08-20 LAB — I-STAT TROPONIN, ED: TROPONIN I, POC: 0 ng/mL (ref 0.00–0.08)

## 2015-08-20 LAB — BASIC METABOLIC PANEL
Anion gap: 14 (ref 5–15)
CHLORIDE: 97 mmol/L — AB (ref 101–111)
CO2: 21 mmol/L — ABNORMAL LOW (ref 22–32)
Calcium: 8.5 mg/dL — ABNORMAL LOW (ref 8.9–10.3)
Creatinine, Ser: 0.61 mg/dL (ref 0.61–1.24)
GFR calc Af Amer: >60 mL/min (ref >60)
Glucose, Bld: 91 mg/dL (ref 65–99)
Potassium: 3.7 mmol/L (ref 3.5–5.1)
Sodium: 132 mmol/L — ABNORMAL LOW (ref 135–145)

## 2015-08-20 LAB — CBC
HCT: 15.6 % — ABNORMAL LOW (ref 39.0–52.0)
Hemoglobin: 3.9 g/dL — CL (ref 13.0–17.0)
MCH: 14.6 pg — ABNORMAL LOW (ref 26.0–34.0)
MCHC: 25 g/dL — ABNORMAL LOW (ref 30.0–36.0)
MCV: 58.4 fL — AB (ref 78.0–100.0)
Platelets: 363 10*3/uL (ref 150–400)
RBC: 2.67 MIL/uL — ABNORMAL LOW (ref 4.22–5.81)
RDW: 23 % — ABNORMAL HIGH (ref 11.5–15.5)
WBC: 9.3 10*3/uL (ref 4.0–10.5)

## 2015-08-20 LAB — PREPARE RBC (CROSSMATCH)

## 2015-08-20 LAB — LIPASE, BLOOD: LIPASE: 23 U/L (ref 11–51)

## 2015-08-20 LAB — POC OCCULT BLOOD, ED: FECAL OCCULT BLD: POSITIVE — AB

## 2015-08-20 MED ORDER — SODIUM CHLORIDE 0.9 % IV SOLN
Freq: Once | INTRAVENOUS | Status: AC
  Administered 2015-08-21: via INTRAVENOUS

## 2015-08-20 MED ORDER — BARIUM SULFATE 2.1 % PO SUSP
ORAL | Status: AC
  Administered 2015-08-20: 450 mL
  Filled 2015-08-20: qty 2

## 2015-08-20 MED ORDER — ONDANSETRON HCL 4 MG/2ML IJ SOLN
4.0000 mg | Freq: Once | INTRAMUSCULAR | Status: AC
Start: 2015-08-20 — End: 2015-08-21
  Administered 2015-08-21: 4 mg via INTRAVENOUS
  Filled 2015-08-20: qty 2

## 2015-08-20 MED ORDER — MORPHINE SULFATE (PF) 4 MG/ML IV SOLN
4.0000 mg | Freq: Once | INTRAVENOUS | Status: AC
  Administered 2015-08-21: 4 mg via INTRAVENOUS
  Filled 2015-08-20: qty 1

## 2015-08-20 NOTE — ED Notes (Signed)
Patient presents stating he started with chest pain and back pain last week and thought he had the flu.  States his appetite has been decreased.  Becomes SOB with ambulation

## 2015-08-20 NOTE — ED Notes (Signed)
Pt returned from radiology.

## 2015-08-21 ENCOUNTER — Encounter (HOSPITAL_COMMUNITY): Payer: Self-pay | Admitting: General Practice

## 2015-08-21 ENCOUNTER — Emergency Department (HOSPITAL_COMMUNITY): Payer: BC Managed Care – PPO

## 2015-08-21 DIAGNOSIS — K922 Gastrointestinal hemorrhage, unspecified: Secondary | ICD-10-CM | POA: Diagnosis present

## 2015-08-21 DIAGNOSIS — K2921 Alcoholic gastritis with bleeding: Secondary | ICD-10-CM | POA: Insufficient documentation

## 2015-08-21 DIAGNOSIS — I351 Nonrheumatic aortic (valve) insufficiency: Secondary | ICD-10-CM | POA: Diagnosis present

## 2015-08-21 DIAGNOSIS — D62 Acute posthemorrhagic anemia: Secondary | ICD-10-CM | POA: Diagnosis present

## 2015-08-21 DIAGNOSIS — Z681 Body mass index (BMI) 19 or less, adult: Secondary | ICD-10-CM | POA: Diagnosis not present

## 2015-08-21 DIAGNOSIS — K449 Diaphragmatic hernia without obstruction or gangrene: Secondary | ICD-10-CM | POA: Diagnosis present

## 2015-08-21 DIAGNOSIS — K274 Chronic or unspecified peptic ulcer, site unspecified, with hemorrhage: Secondary | ICD-10-CM | POA: Diagnosis not present

## 2015-08-21 DIAGNOSIS — F17210 Nicotine dependence, cigarettes, uncomplicated: Secondary | ICD-10-CM | POA: Diagnosis present

## 2015-08-21 DIAGNOSIS — F1210 Cannabis abuse, uncomplicated: Secondary | ICD-10-CM | POA: Diagnosis present

## 2015-08-21 DIAGNOSIS — D500 Iron deficiency anemia secondary to blood loss (chronic): Secondary | ICD-10-CM | POA: Diagnosis present

## 2015-08-21 DIAGNOSIS — B9681 Helicobacter pylori [H. pylori] as the cause of diseases classified elsewhere: Secondary | ICD-10-CM | POA: Diagnosis present

## 2015-08-21 DIAGNOSIS — J849 Interstitial pulmonary disease, unspecified: Secondary | ICD-10-CM

## 2015-08-21 DIAGNOSIS — K259 Gastric ulcer, unspecified as acute or chronic, without hemorrhage or perforation: Secondary | ICD-10-CM | POA: Diagnosis present

## 2015-08-21 DIAGNOSIS — K648 Other hemorrhoids: Secondary | ICD-10-CM | POA: Diagnosis present

## 2015-08-21 DIAGNOSIS — I248 Other forms of acute ischemic heart disease: Secondary | ICD-10-CM | POA: Diagnosis present

## 2015-08-21 DIAGNOSIS — D509 Iron deficiency anemia, unspecified: Secondary | ICD-10-CM | POA: Diagnosis not present

## 2015-08-21 DIAGNOSIS — K254 Chronic or unspecified gastric ulcer with hemorrhage: Secondary | ICD-10-CM | POA: Diagnosis not present

## 2015-08-21 DIAGNOSIS — E43 Unspecified severe protein-calorie malnutrition: Secondary | ICD-10-CM | POA: Diagnosis present

## 2015-08-21 DIAGNOSIS — K219 Gastro-esophageal reflux disease without esophagitis: Secondary | ICD-10-CM | POA: Diagnosis present

## 2015-08-21 DIAGNOSIS — R079 Chest pain, unspecified: Secondary | ICD-10-CM

## 2015-08-21 DIAGNOSIS — K760 Fatty (change of) liver, not elsewhere classified: Secondary | ICD-10-CM | POA: Diagnosis present

## 2015-08-21 DIAGNOSIS — R64 Cachexia: Secondary | ICD-10-CM | POA: Diagnosis present

## 2015-08-21 DIAGNOSIS — E876 Hypokalemia: Secondary | ICD-10-CM | POA: Diagnosis present

## 2015-08-21 DIAGNOSIS — K921 Melena: Secondary | ICD-10-CM

## 2015-08-21 DIAGNOSIS — K2920 Alcoholic gastritis without bleeding: Secondary | ICD-10-CM | POA: Diagnosis present

## 2015-08-21 DIAGNOSIS — F1010 Alcohol abuse, uncomplicated: Secondary | ICD-10-CM | POA: Diagnosis present

## 2015-08-21 DIAGNOSIS — R634 Abnormal weight loss: Secondary | ICD-10-CM | POA: Diagnosis present

## 2015-08-21 HISTORY — DX: Chest pain, unspecified: R07.9

## 2015-08-21 HISTORY — DX: Melena: K92.1

## 2015-08-21 LAB — CBC
HEMATOCRIT: 28.6 % — AB (ref 39.0–52.0)
HEMATOCRIT: 27.6 % — AB (ref 39.0–52.0)
HEMOGLOBIN: 8.3 g/dL — AB (ref 13.0–17.0)
Hemoglobin: 8.6 g/dL — ABNORMAL LOW (ref 13.0–17.0)
MCH: 21.7 pg — AB (ref 26.0–34.0)
MCH: 21.3 pg — AB (ref 26.0–34.0)
MCHC: 30.1 g/dL (ref 30.0–36.0)
MCHC: 30.1 g/dL (ref 30.0–36.0)
MCV: 72.2 fL — AB (ref 78.0–100.0)
MCV: 71 fL — ABNORMAL LOW (ref 78.0–100.0)
PLATELETS: 245 10*3/uL (ref 150–400)
Platelets: 248 10*3/uL (ref 150–400)
RBC: 3.96 MIL/uL — ABNORMAL LOW (ref 4.22–5.81)
RBC: 3.89 MIL/uL — ABNORMAL LOW (ref 4.22–5.81)
WBC: 8.4 10*3/uL (ref 4.0–10.5)
WBC: 9 10*3/uL (ref 4.0–10.5)

## 2015-08-21 LAB — GLUCOSE, CAPILLARY: GLUCOSE-CAPILLARY: 103 mg/dL — AB (ref 65–99)

## 2015-08-21 LAB — HEPATIC FUNCTION PANEL
ALT: 17 U/L (ref 17–63)
AST: 27 U/L (ref 15–41)
Albumin: 2.6 g/dL — ABNORMAL LOW (ref 3.5–5.0)
Alkaline Phosphatase: 80 U/L (ref 38–126)
BILIRUBIN DIRECT: 0.6 mg/dL — AB (ref 0.1–0.5)
BILIRUBIN INDIRECT: 1.8 mg/dL — AB (ref 0.3–0.9)
TOTAL PROTEIN: 6.4 g/dL — AB (ref 6.5–8.1)
Total Bilirubin: 2.4 mg/dL — ABNORMAL HIGH (ref 0.3–1.2)

## 2015-08-21 LAB — TROPONIN I
Troponin I: <0.03 ng/mL (ref <0.031)
Troponin I: <0.03 ng/mL (ref <0.031)

## 2015-08-21 LAB — RAPID URINE DRUG SCREEN, HOSP PERFORMED
Amphetamines: NOT DETECTED
BARBITURATES: NOT DETECTED
Benzodiazepines: NOT DETECTED
Cocaine: NOT DETECTED
OPIATES: POSITIVE — AB
TETRAHYDROCANNABINOL: POSITIVE — AB

## 2015-08-21 LAB — APTT: APTT: 32 s (ref 24–37)

## 2015-08-21 LAB — PROTIME-INR
INR: 1.09 (ref 0.00–1.49)
PROTHROMBIN TIME: 14.3 s (ref 11.6–15.2)

## 2015-08-21 LAB — PREPARE RBC (CROSSMATCH)

## 2015-08-21 LAB — MRSA PCR SCREENING: MRSA by PCR: NEGATIVE

## 2015-08-21 MED ORDER — LORAZEPAM 2 MG/ML IJ SOLN: 1.0000 mg | Freq: Four times a day (QID) | INTRAMUSCULAR | Status: AC | PRN

## 2015-08-21 MED ORDER — SODIUM CHLORIDE 0.9 % IV SOLN
80.0000 mg | Freq: Once | INTRAVENOUS | Status: AC
  Administered 2015-08-21: 80 mg via INTRAVENOUS
  Filled 2015-08-21: qty 80

## 2015-08-21 MED ORDER — CETYLPYRIDINIUM CHLORIDE 0.05 % MT LIQD
7.0000 mL | Freq: Two times a day (BID) | OROMUCOSAL | Status: DC
  Administered 2015-08-21 – 2015-08-25 (×3): 7 mL via OROMUCOSAL

## 2015-08-21 MED ORDER — VITAMIN B-1 100 MG PO TABS
100.0000 mg | ORAL_TABLET | Freq: Every day | ORAL | Status: DC
  Administered 2015-08-21 – 2015-08-26 (×5): 100 mg via ORAL
  Filled 2015-08-21 (×5): qty 1

## 2015-08-21 MED ORDER — SODIUM CHLORIDE 0.9 % IV BOLUS (SEPSIS)
1000.0000 mL | Freq: Once | INTRAVENOUS | Status: AC
  Administered 2015-08-21: 1000 mL via INTRAVENOUS

## 2015-08-21 MED ORDER — CHLORHEXIDINE GLUCONATE 0.12 % MT SOLN
15.0000 mL | Freq: Two times a day (BID) | OROMUCOSAL | Status: DC
  Administered 2015-08-21 – 2015-08-26 (×10): 15 mL via OROMUCOSAL
  Filled 2015-08-21 (×8): qty 15

## 2015-08-21 MED ORDER — PANTOPRAZOLE SODIUM 40 MG IV SOLR: 40.0000 mg | Freq: Two times a day (BID) | INTRAVENOUS | Status: DC

## 2015-08-21 MED ORDER — LORAZEPAM 2 MG/ML IJ SOLN: 0.0000 mg | Freq: Two times a day (BID) | INTRAMUSCULAR | Status: AC

## 2015-08-21 MED ORDER — SODIUM CHLORIDE 0.9 % IV SOLN: Freq: Once | INTRAVENOUS | Status: DC

## 2015-08-21 MED ORDER — MORPHINE SULFATE (PF) 2 MG/ML IV SOLN: 2.0000 mg | INTRAVENOUS | Status: DC | PRN

## 2015-08-21 MED ORDER — ALBUTEROL SULFATE (2.5 MG/3ML) 0.083% IN NEBU: 2.5000 mg | INHALATION_SOLUTION | RESPIRATORY_TRACT | Status: DC | PRN

## 2015-08-21 MED ORDER — IOHEXOL 300 MG/ML  SOLN
100.0000 mL | Freq: Once | INTRAMUSCULAR | Status: AC | PRN
  Administered 2015-08-21: 100 mL via INTRAVENOUS

## 2015-08-21 MED ORDER — SODIUM CHLORIDE 0.9 % IV SOLN
8.0000 mg/h | INTRAVENOUS | Status: DC
  Administered 2015-08-21 – 2015-08-25 (×10): 8 mg/h via INTRAVENOUS
  Filled 2015-08-21 (×21): qty 80

## 2015-08-21 MED ORDER — SODIUM CHLORIDE 0.9 % IJ SOLN
3.0000 mL | Freq: Two times a day (BID) | INTRAMUSCULAR | Status: DC
  Administered 2015-08-22 – 2015-08-25 (×4): 3 mL via INTRAVENOUS

## 2015-08-21 MED ORDER — PNEUMOCOCCAL VAC POLYVALENT 25 MCG/0.5ML IJ INJ
0.5000 mL | INJECTION | INTRAMUSCULAR | Status: DC
  Filled 2015-08-21: qty 0.5

## 2015-08-21 MED ORDER — THIAMINE HCL 100 MG/ML IJ SOLN
100.0000 mg | Freq: Every day | INTRAMUSCULAR | Status: DC
  Filled 2015-08-21: qty 2

## 2015-08-21 MED ORDER — ADULT MULTIVITAMIN W/MINERALS CH
1.0000 | ORAL_TABLET | Freq: Every day | ORAL | Status: DC
  Administered 2015-08-21 – 2015-08-26 (×5): 1 via ORAL
  Filled 2015-08-21 (×5): qty 1

## 2015-08-21 MED ORDER — GUAIFENESIN ER 600 MG PO TB12
600.0000 mg | ORAL_TABLET | Freq: Two times a day (BID) | ORAL | Status: DC
  Administered 2015-08-21 (×2): 600 mg via ORAL
  Filled 2015-08-21 (×3): qty 1

## 2015-08-21 MED ORDER — FOLIC ACID 1 MG PO TABS
1.0000 mg | ORAL_TABLET | Freq: Every day | ORAL | Status: DC
  Administered 2015-08-21 – 2015-08-26 (×5): 1 mg via ORAL
  Filled 2015-08-21 (×5): qty 1

## 2015-08-21 MED ORDER — IPRATROPIUM BROMIDE 0.02 % IN SOLN
0.5000 mg | Freq: Four times a day (QID) | RESPIRATORY_TRACT | Status: DC
  Administered 2015-08-21 – 2015-08-22 (×5): 0.5 mg via RESPIRATORY_TRACT
  Filled 2015-08-21 (×5): qty 2.5

## 2015-08-21 MED ORDER — NICOTINE 21 MG/24HR TD PT24
21.0000 mg | MEDICATED_PATCH | Freq: Every day | TRANSDERMAL | Status: DC
  Administered 2015-08-21 – 2015-08-26 (×6): 21 mg via TRANSDERMAL
  Filled 2015-08-21 (×6): qty 1

## 2015-08-21 MED ORDER — SODIUM CHLORIDE 0.9 % IV SOLN: INTRAVENOUS | Status: DC

## 2015-08-21 MED ORDER — LORAZEPAM 2 MG/ML IJ SOLN
0.0000 mg | Freq: Four times a day (QID) | INTRAMUSCULAR | Status: AC
Start: 2015-08-21 — End: 2015-08-23

## 2015-08-21 MED ORDER — BOOST / RESOURCE BREEZE PO LIQD
1.0000 | Freq: Three times a day (TID) | ORAL | Status: DC
Start: 2015-08-21 — End: 2015-08-24
  Administered 2015-08-21 – 2015-08-23 (×8): 1 via ORAL

## 2015-08-21 MED ORDER — LORAZEPAM 1 MG PO TABS: 1.0000 mg | ORAL_TABLET | Freq: Four times a day (QID) | ORAL | Status: AC | PRN

## 2015-08-21 MED ORDER — ONDANSETRON HCL 4 MG/2ML IJ SOLN: 4.0000 mg | Freq: Three times a day (TID) | INTRAMUSCULAR | Status: DC | PRN

## 2015-08-21 MED ORDER — PEG 3350-KCL-NA BICARB-NACL 420 G PO SOLR
4000.0000 mL | Freq: Once | ORAL | Status: AC
  Administered 2015-08-21: 4000 mL via ORAL
  Filled 2015-08-21: qty 4000

## 2015-08-21 MED ORDER — FERROUS SULFATE 325 (65 FE) MG PO TABS
325.0000 mg | ORAL_TABLET | Freq: Two times a day (BID) | ORAL | Status: DC
  Administered 2015-08-21 – 2015-08-26 (×10): 325 mg via ORAL
  Filled 2015-08-21 (×12): qty 1

## 2015-08-21 NOTE — Progress Notes (Signed)
[facility] TEAM 1 - Stepdown/ICU TEAM Progress Note  LINZY LADEHOFF [id number] DOB: [birth year] DOA: 08/20/2015 PCP: Wyatt Haste, MD  Admit HPI / Brief Narrative: 59 y.o. BM PMHx Polysubstance Substance Abuse (marijuana, EtOH, opiates) Fe Deficiency anemia, Cachexia, Tobacco Abuse, Pulmonary Nodule, Aortic Insufficiency, ILD, GERD,   Who presents with chest pain, shortness of breath, abdominal pain, black stool.  Patient reports that he has been having chest pain, shortness of breath and AP for about one week. His CP is located in front and left chest. It is constant, mild, radiating to the left back. It is aggravated by standing up and movement. His AP is located in the epigastricarea and left upper quadrant. It is moderate and nonradiating. Patient has mild cough with brownish colored sputum production which he attributes to smoking. Patient states that he noticed black stool in the past 2 days, no diarrhea, symptoms of UTI, hematemesis, hematochezia or unilateral weakness.   In ED, patient was found to have hemoglobin dropped from 9.9 on 01/05/15 -->3.9, positive FOBT, tachycardia, temperature normal, renal function and electrolytes okay. Chest x-ray showed emphysematous change without infiltration. CT abdomen/pelvis is negative for acute abnormalities. Patient is admitted to the inpatient for further eval and treatment. GI was consulted by EDP, will see in morning.   HPI/Subjective: 12/13 A/O 4, NAD. Cachectic. States had colonoscopy last year which showed polyps. Does not recall pathology of polyps.   Assessment/Plan: GIB:  -pt was was admited in 12/2014 due to GIB. He underwent EGD on 01/05/15 with no evidence of bleeding. 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  -Pt was scheduled for outpt colonoscopy on 02/07/15, but this seems to have not done. He is likely to have upper GIB given dark stool, epigastric pain, and history of gastritis and  alcohol abuse.  - GI consulted  - NPO for possible EGD - IVF: 1L of NS, then 125 mL/hr - transfuse 3 units of blood - Start IV pantoprazole infusion - Zofran IV for nausea - Avoid NSAIDs and SQ heparin - Maintain IV access (2 large bore IVs if possible). - Monitor closely and follow q6h cbc, transfuse as necessary. - LaB: INR, PTT  Polysubstance abuse EtOH abuse -UDS positive for opiates and marijuana  CP:  -Orris multifactorial to include polysubstance abuse, demand ischemia versus epigastric pain from alcoholic gastritis. -Troponins negative. EKG has no ischemic changes. -trop x 3 -prn morphine for pain  Abdominal pain:  -Most likely due to alcoholic gastritis. CT abdomen/pelvis is not impressive. Lipase normal. -When necessary morphine for pain -IV Protonix started  Iron deficiency anemia due to GI bleeding: -On iron supplement -3 units of blood was ordered  Tobacco abuse and Alcohol abuse: -Did counseling about importance of quitting smoking -Nicotine patch -Did counseling about the importance of quitting drinking -CIWA protocol  ILD (interstitial lung disease) ([place]): patient has productive cough. No wheezing or rhonchi on auscultation. Chest x-ray showed emphysematous change without infiltration. -Atrovent nebs and prn albuterol nebs -mucinex prn for cough  Severe protein-calorie malnutrition ([place]): -consult to nutrition team  Hx of Marijuana abuse: --Did counseling about importance of quitting - check UDS  Tobacco abuse -Nicotine patch -Post EGD/colonoscopy if patient able would discuss Zyban    Code Status: FULL Family Communication: family present at time of exam Disposition Plan: Per GI    Consultants: Dr.John Starr [facility]  Procedure/Significant Events: 12/13 transfused 3 units PRBC   Culture   Antibiotics:   DVT prophylaxis:  SCD   Devices   LINES / TUBES:      Continuous Infusions: . sodium chloride 10 mL/hr at 08/21/15  0900  . pantoprozole (PROTONIX) infusion 8 mg/hr (08/21/15 0829)    Objective: VITAL SIGNS: Temp: 98.4 F (36.9 C) (12/13 1115) Temp Source: Oral (12/13 1115) BP: 117/56 mmHg (12/13 1300) Pulse Rate: 84 (12/13 1300) SPO2; FIO2:   Intake/Output Summary (Last 24 hours) at 08/21/15 1512 Last data filed at 08/21/15 1115  Gross per 24 hour  Intake 1412.92 ml  Output      0 ml  Net 1412.92 ml     Exam: General: A/O 4, NAD, cachectic, No acute respiratory distress Eyes: Negative headache, eye pain, double vision,negative scleral hemorrhage ENT: Negative Runny nose, negative ear pain, negative gingival bleeding, Neck:  Negative scars, masses, torticollis, lymphadenopathy, JVD Lungs: decreased air movement diffusely but clear to auscultation, without wheezes or crackles Cardiovascular: Regular rate and rhythm without murmur gallop or rub normal S1 and S2 Abdomen:negative abdominal pain, nondistended, positive soft, bowel sounds, no rebound, no ascites, no appreciable mass Extremities: No significant cyanosis, clubbing, or edema bilateral lower extremities Psychiatric:  Negative depression, negative anxiety, negative fatigue, negative mania  Neurologic:  Cranial nerves II through XII intact, tongue/uvula midline, all extremities muscle strength 5/5, sensation intact throughout, negative dysarthria, negative expressive aphasia, negative receptive aphasia.   Data Reviewed: Basic Metabolic Panel:  Recent Labs Lab 08/20/15 2206  NA 132*  K 3.7  CL 97*  CO2 21*  GLUCOSE 91  BUN <5*  CREATININE 0.61  CALCIUM 8.5*   Liver Function Tests:  Recent Labs Lab 08/21/15 0527  AST 27  ALT 17  ALKPHOS 80  BILITOT 2.4*  PROT 6.4*  ALBUMIN 2.6*    Recent Labs Lab 08/20/15 2206  LIPASE 23   No results for input(s): AMMONIA in the last 168 hours. CBC:  Recent Labs Lab 08/20/15 2206 08/21/15 1340  WBC 9.3 8.4  HGB 3.9* 8.6*  HCT 15.6* 28.6*  MCV 58.4* 72.2*  PLT 363  245   Cardiac Enzymes:  Recent Labs Lab 08/21/15 0527 08/21/15 1340  TROPONINI <0.03 <0.03   BNP (last 3 results)  Recent Labs  01/01/15 1957  BNP 20.4    ProBNP (last 3 results) No results for input(s): PROBNP in the last 8760 hours.  CBG:  Recent Labs Lab 08/21/15 0948  GLUCAP 103*    Recent Results (from the past 240 hour(s))  MRSA PCR Screening     Status: None   Collection Time: 08/21/15  9:20 AM  Result Value Ref Range Status   MRSA by PCR NEGATIVE NEGATIVE Final    Comment:        The GeneXpert MRSA Assay (FDA approved for NASAL specimens only), is one component of a comprehensive MRSA colonization surveillance program. It is not intended to diagnose MRSA infection nor to guide or monitor treatment for MRSA infections.      Studies:  Recent x-ray studies have been reviewed in detail by the Attending Physician  Scheduled Meds:  Scheduled Meds: . sodium chloride   Intravenous Once  . antiseptic oral rinse  7 mL Mouth Rinse q12n4p  . chlorhexidine  15 mL Mouth Rinse BID  . feeding supplement  1 Container Oral TID BM  . ferrous sulfate  325 mg Oral BID WC  . folic acid  1 mg Oral Daily  . guaiFENesin  600 mg Oral BID  . ipratropium  0.5 mg Nebulization Q6H  . LORazepam  0-4 mg Intravenous Q6H   Followed by  . [START ON 08/23/2015] LORazepam  0-4 mg Intravenous Q12H  . multivitamin with minerals  1 tablet Oral Daily  . nicotine  21 mg Transdermal Daily  . [START ON 08/24/2015] pantoprazole (PROTONIX) IV  40 mg Intravenous Q12H  . [START ON 08/22/2015] pneumococcal 23 valent vaccine  0.5 mL Intramuscular Tomorrow-1000  . sodium chloride  3 mL Intravenous Q12H  . thiamine  100 mg Oral Daily   Or  . thiamine  100 mg Intravenous Daily    Time spent on care of this patient: 40 mins   Jonathon Tan, Geraldo Docker , MD  [facility] Office  [phone] Pager - [phone]  On-Call/Text Page:      [email or web address]      password [email or web address]  If 7PM-7AM,  please contact night-coverage [email or web address] Password [email or web address] 08/21/2015, 3:12 PM   LOS: 0 days   Care during the described time interval was provided by me .  I have reviewed this patient's available data, including medical history, events of note, physical examination, and all test results as part of my evaluation. I have personally reviewed and interpreted all radiology studies.   Dia Crawford, MD [phone] Pager

## 2015-08-21 NOTE — Consult Note (Signed)
[facility] Consult Note  Referring Provider: No ref. provider found Primary Care Physician:  Wyatt Haste, MD Primary Gastroenterologist:  Dr.  Laurel Dimmer Complaint: Chest pain and black stool HPI: James [name] is an 59 y.o. black male  who presents with left-sided chest pain weakness orthostasis and in the last 2 days dark stool. He had a hemoglobin of 3.9. He had a colonoscopy in April at which time he had a hemoglobin of approximately 9 which showed enlarged gastric folds and was otherwise normal. He had a colonoscopy in 2013 that showed multiple polyps.   Past Medical History  Diagnosis Date  . GERD (gastroesophageal reflux disease)   . Dysphagia   . Alcohol abuse   . Anemia due to GI blood loss 09/2011    microcytic.  transfused for Hgb 3.5, MCV in 50s.   . Benign neoplasm of colon   . Cachexia ([place])   . Edema   . ILD (interstitial lung disease) ([place])   . Iron deficiency anemia, unspecified   . Pulmonary nodule   . Reflux esophagitis   . Tobacco abuse   . Weight loss, abnormal   . Smoker   . ED (erectile dysfunction)   . Aortic insufficiency     Past Surgical History  Procedure Laterality Date  . Esophagogastroduodenoscopy  10/02/2011    Procedure: ESOPHAGOGASTRODUODENOSCOPY (EGD);  Surgeon: Scarlette Shorts, MD;  Location[facility] ENDOSCOPY;  Service: Endoscopy;  Laterality: N/A;  . Colonoscopy  10/03/2011    Procedure: COLONOSCOPY;  Surgeon: Scarlette Shorts, MD;  Location: [facility];  Service: Endoscopy;  Laterality: N/A;  . Esophagogastroduodenoscopy N/A 01/05/2015    Procedure: ESOPHAGOGASTRODUODENOSCOPY (EGD);  Surgeon: Inda Castle, MD;  Location: [facility];  Service: Endoscopy;  Laterality: N/A;     (Not in a hospital admission)  Allergies: No Known Allergies  Family History  Problem Relation Age of Onset  . Hypertension Mother   . Diabetes Maternal Grandfather     Social History:  reports that he has been smoking Cigarettes.  He has a 20  pack-year smoking history. He has never used smokeless tobacco. He reports that he drinks about 1.2 oz of alcohol per week. He reports that he uses illicit drugs (Marijuana).  Review of Systems: negative except as above   Blood pressure 112/68, pulse 78, temperature 97.9 F (36.6 C), temperature source Oral, resp. rate 17, height 5' 9"  (1.753 m), weight 42.774 kg (94 lb 4.8 oz), SpO2 100 %. Head: Normocephalic, without obvious abnormality, atraumatic Neck: no adenopathy, no carotid bruit, no JVD, supple, symmetrical, trachea midline and thyroid not enlarged, symmetric, no tenderness/mass/nodules Resp: clear to auscultation bilaterally Cardio: regular rate and rhythm, S1, S2 normal, no murmur, click, rub or gallop GI: Abdomen soft nondistended with normoactive bowel sounds. No spider megaly masses or guarding Extremities: extremities normal, atraumatic, no cyanosis or edema  Results for orders placed or performed during the hospital encounter of 08/20/15 (from the past 48 hour(s))  Basic metabolic panel     Status: Abnormal   Collection Time: 08/20/15 10:06 PM  Result Value Ref Range   Sodium 132 (L) 135 - 145 mmol/L   Potassium 3.7 3.5 - 5.1 mmol/L   Chloride 97 (L) 101 - 111 mmol/L   CO2 21 (L) 22 - 32 mmol/L   Glucose, Bld 91 65 - 99 mg/dL   BUN <5 (L) 6 - 20 mg/dL   Creatinine, Ser 0.61 0.61 - 1.24 mg/dL   Calcium 8.5 (L) 8.9 - 10.3 mg/dL  GFR calc non Af Amer >60 >60 mL/min   GFR calc Af Amer >60 >60 mL/min    Comment: (NOTE) The eGFR has been calculated using the CKD EPI equation. This calculation has not been validated in all clinical situations. eGFR's persistently <60 mL/min signify possible Chronic Kidney Disease.    Anion gap 14 5 - 15  CBC     Status: Abnormal   Collection Time: 08/20/15 10:06 PM  Result Value Ref Range   WBC 9.3 4.0 - 10.5 K/uL   RBC 2.67 (L) 4.22 - 5.81 MIL/uL   Hemoglobin 3.9 (LL) 13.0 - 17.0 g/dL    Comment: REPEATED TO VERIFY CRITICAL RESULT  CALLED TO, READ BACK BY AND VERIFIED WITH: Thomes Lolling 2237 08/20/15 D BRADLEY QUESTIONABLE RESULTS, RECOMMEND RECOLLECT TO VERIFY    HCT 15.6 (L) 39.0 - 52.0 %   MCV 58.4 (L) 78.0 - 100.0 fL   MCH 14.6 (L) 26.0 - 34.0 pg   MCHC 25.0 (L) 30.0 - 36.0 g/dL   RDW 23.0 (H) 11.5 - 15.5 %   Platelets 363 150 - 400 K/uL  Lipase, blood     Status: None   Collection Time: 08/20/15 10:06 PM  Result Value Ref Range   Lipase 23 11 - 51 U/L  I-stat troponin, ED (not at [facility])     Status: None   Collection Time: 08/20/15 10:14 PM  Result Value Ref Range   Troponin i, poc 0.00 0.00 - 0.08 ng/mL   Comment 3            Comment: Due to the release kinetics of cTnI, a negative result within the first hours of the onset of symptoms does not rule out myocardial infarction with certainty. If myocardial infarction is still suspected, repeat the test at appropriate intervals.   Prepare RBC     Status: None   Collection Time: 08/20/15 10:47 PM  Result Value Ref Range   Order Confirmation ORDER PROCESSED BY BLOOD BANK   Type and screen [place]     Status: None (Preliminary result)   Collection Time: 08/20/15 11:00 PM  Result Value Ref Range   ABO/RH(D) O POS    Antibody Screen NEG    Sample Expiration 08/23/2015    Unit Number [id number]    Blood Component Type RED CELLS,LR    Unit division 00    Status of Unit ISSUED    Transfusion Status OK TO TRANSFUSE    Crossmatch Result Compatible    Unit Number [id number]    Blood Component Type RED CELLS,LR    Unit division 00    Status of Unit ISSUED    Transfusion Status OK TO TRANSFUSE    Crossmatch Result Compatible   POC occult blood, ED     Status: Abnormal   Collection Time: 08/20/15 11:22 PM  Result Value Ref Range   Fecal Occult Bld POSITIVE (A) NEGATIVE  Protime-INR     Status: None   Collection Time: 08/21/15  5:27 AM  Result Value Ref Range   Prothrombin Time 14.3 11.6 - 15.2 seconds   INR 1.09 0.00 -  1.49  APTT     Status: None   Collection Time: 08/21/15  5:27 AM  Result Value Ref Range   aPTT 32 24 - 37 seconds  Hepatic function panel     Status: Abnormal   Collection Time: 08/21/15  5:27 AM  Result Value Ref Range   Total Protein 6.4 (L) 6.5 - 8.1  g/dL   Albumin 2.6 (L) 3.5 - 5.0 g/dL   AST 27 15 - 41 U/L   ALT 17 17 - 63 U/L   Alkaline Phosphatase 80 38 - 126 U/L   Total Bilirubin 2.4 (H) 0.3 - 1.2 mg/dL   Bilirubin, Direct 0.6 (H) 0.1 - 0.5 mg/dL   Indirect Bilirubin 1.8 (H) 0.3 - 0.9 mg/dL  Troponin I (q 6hr x 3)     Status: None   Collection Time: 08/21/15  5:27 AM  Result Value Ref Range   Troponin I <0.03 <0.031 ng/mL    Comment:        NO INDICATION OF MYOCARDIAL INJURY.    Dg Chest 2 View  08/20/2015  CLINICAL DATA:  Chest pain, shortness of breath, symptoms for 5 days but worsened today. EXAM: CHEST  2 VIEW COMPARISON:  CT chest 01/04/2015 and 01/02/2015. Chest radiograph 10/01/2011. FINDINGS: Diffuse hyperinflation suggesting emphysema. Normal heart size and pulmonary vascularity. No focal airspace disease or consolidation in the lungs. No blunting of costophrenic angles. No pneumothorax. Mediastinal contours appear intact. IMPRESSION: Emphysematous changes in the chest. No evidence of active pulmonary disease. Electronically Signed   By: Lucienne Capers M.D.   On: 08/20/2015 22:28   Ct Abdomen Pelvis W Contrast  08/21/2015  CLINICAL DATA:  Left-sided and low back pain for several days. Generalize weakness. History of alcohol abuse, GI bleed, anemia and iron deficiency due to malnutrition. EXAM: CT ABDOMEN AND PELVIS WITH CONTRAST TECHNIQUE: Multidetector CT imaging of the abdomen and pelvis was performed using the standard protocol following bolus administration of intravenous contrast. CONTRAST:  148m OMNIPAQUE IOHEXOL 300 MG/ML  SOLN COMPARISON:  01/02/2015 FINDINGS: Lung bases are clear. Diffuse fatty infiltration of the liver. The gallbladder, spleen, pancreas,  adrenal glands, kidneys, abdominal aorta, inferior vena cava, and retroperitoneal lymph nodes are unremarkable. Diffuse gastric wall thickening similar to prior study. This could indicate gastritis. Infiltrating neoplasm or lymphoma not excluded. Small bowel and colon are not abnormally distended. Contrast material flows through to the rectum without evidence of small or large bowel obstruction. No free air or free fluid in the abdomen. Pelvis: Appendix is normal. Bladder wall is not thickened. Prostate gland is not enlarged. No free or loculated pelvic fluid collections. No pelvic mass or lymphadenopathy. No destructive bone lesions. Vascular calcifications. IMPRESSION: No acute process demonstrated in the abdomen or pelvis. Gastric wall thickening is similar to previous study and likely to represent gastritis although infiltrating neoplasm or lymphoma could also potentially have this appearance. Diffuse fatty infiltration of the liver. Electronically Signed   By: WLucienne CapersM.D.   On: 08/21/2015 03:53    Assessment: Chest pain with negative troponins and normal EKG Anemia with dark stools with significant drop in hemoglobin since April. Plan:  Agree with transfusion of 3 units packed red blood cells IV proton pump inhibitor Will plan colonoscopy and EGD at noon tomorrow. Celsey Asselin C 08/21/2015, 8:39 AM  Pager [phone] no answer or after 5 PM call [phone]

## 2015-08-21 NOTE — Progress Notes (Signed)
PT Cancellation Note  Patient Details Name: RAYNEN FARBMAN MRN: [id number] DOB: 12-19-1955   Cancelled Treatment:    Reason Eval/Treat Not Completed: Patient not medically ready  Pt just up from ED with Hgb of 3.9, s/p 1 unit of blood transfusion. Planned for 2 more units. Will follow up to perform PT evaluation.  Marguarite Arbour A Kadija Cruzen 08/21/2015, 9:16 AM Wray Kearns, PT, DPT [phone]

## 2015-08-21 NOTE — Care Management Note (Signed)
Case Management Note  Patient Details  Name: James [name] MRN: [id number] Date of Birth: January 02, 1956  Subjective/Objective:     GIB              Action/Plan:  NCM spoke to pt and lives at home with wife, James [name] # [phone]. He is retired and can afford his medications at home. Pt states he is independent at home. Explained NCM will continue to follow for any dc needs.   Expected Discharge Date:                 Expected Discharge Plan:  Home/Self Care  In-House Referral:  NA  Discharge planning Services  CM Consult  Post Acute Care Choice:  NA Choice offered to:  NA  DME Arranged:  N/A DME Agency:  NA  HH Arranged:  NA HH Agency:  NA  Status of Service:  Completed, signed off  Medicare Important Message Given:    Date Medicare IM Given:    Medicare IM give by:    Date Additional Medicare IM Given:    Additional Medicare Important Message give by:     If discussed at [place] of Stay Meetings, dates discussed:    Additional Comments:  Erenest Rasher, RN 08/21/2015, 11:18 AM

## 2015-08-21 NOTE — ED Provider Notes (Signed)
CT scan no pathology to explain anemia  1 unit blood transfused No active bleednig at this time  Junius Creamer, NP 08/21/15 [id number]  Blanchie Dessert, MD 08/22/15 2130

## 2015-08-21 NOTE — Progress Notes (Signed)
Initial Nutrition Assessment  DOCUMENTATION CODES:   Severe malnutrition in context of chronic illness, Underweight  INTERVENTION:   Boost Breeze po TID, each supplement provides 250 kcal and 9 grams of protein  NUTRITION DIAGNOSIS:   Malnutrition related to chronic illness as evidenced by severe depletion of body fat, severe depletion of muscle mass  GOAL:   Patient will meet greater than or equal to 90% of their needs  MONITOR:   PO intake, Supplement acceptance, Weight trends, Labs, I & O's  REASON FOR ASSESSMENT:   Consult Assessment of nutrition requirement/status  ASSESSMENT:   59 y.o. Male with PMH of iron deficiency anemia, cachexia, ETOH abuse, tobacco abuse, marijuana abuse, aortic insufficiency, ILD, GERD, who presents with chest pain, shortness of breath, abdominal pain, black stool.  Patient reports a poor appetite PTA.  Was experiencing vomiting.  States he typically consumes 2 meals per day.  Endorses severe weight loss in a progressive nature.  Unable to quantify amount and/or time frame.  Amenable to [employer] oral nutrition supplement.  Would like [employer].  GI note reviewed.  Plan is for EGD and colonoscopy tomorrow.  Nutrition-Focused physical exam completed. Findings are severe fat depletion, severe muscle depletion, and no edema.   Diet Order:  Diet clear liquid Room service appropriate?: Yes; Fluid consistency:: Thin Diet NPO time specified Except for: Sips with Meds  Skin:  Reviewed, no issues  Last BM:  N/A  Height:   Ht Readings from Last 1 Encounters:  08/20/15 5\' 9"  (1.753 m)    Weight:   Wt Readings from Last 1 Encounters:  08/20/15 94 lb 4.8 oz (42.774 kg)    Ideal Body Weight:  73 kg  BMI:  Body mass index is 13.92 kg/(m^2).  Estimated Nutritional Needs:   Kcal:  1500-1700  Protein:  70-80 gm  Fluid:  1.5-1.7 L  EDUCATION NEEDS:   No education needs identified at this time  Arthur Holms, RD, LDN Pager  #: [phone] After-Hours Pager #: [phone]

## 2015-08-21 NOTE — ED Notes (Signed)
Patient transported to CT 

## 2015-08-21 NOTE — ED Notes (Signed)
Blood consent signed

## 2015-08-21 NOTE — H&P (Signed)
[facility] History and Physical  James [name] [id number] DOB: [birth year] DOA: 08/20/2015  Referring physician: ED physician PCP: Wyatt Haste, MD  Specialists:   Chief Complaint: Black stool, chest pain, shortness of breath, abdominal pain,  HPI: James [name] is a 59 y.o. male with PMH of iron deficiency anemia, cachexia, ETOH abuse, tobacco abuse, marijuana abuse, aortic insufficiency, ILD, GERD, who presents with chest pain, shortness of breath, abdominal pain, black stool.  Patient reports that he has been having chest pain, shortness of breath and AP for about one week. His CP is located in front and left chest. It is constant, mild, radiating to the left back. It is aggravated by standing up and movement. His AP is located in the epigastricarea and left upper quadrant. It is moderate and nonradiating. Patient has mild cough with brownish colored sputum production which he attributes to smoking. Patient states that he noticed black stool in the past 2 days, no diarrhea, symptoms of UTI, hematemesis, hematochezia or unilateral weakness.   In ED, patient was found to have hemoglobin dropped from 9.9 on 01/05/15 -->3.9, positive FOBT, tachycardia, temperature normal, renal function and electrolytes okay. Chest x-ray showed emphysematous change without infiltration. CT abdomen/pelvis is negative for acute abnormalities. Patient is admitted to the inpatient for further eval and treatment. GI was consulted by EDP, will see in morning.  Where does patient live?   At home   Can patient participate in ADLs? Little   Review of Systems:   General: no fevers, chills, no changes in body weight, has poor appetite, has fatigue HEENT: no blurry vision, hearing changes or sore throat Pulm: has dyspnea, coughing, no wheezing CV: has chest pain, no palpitations Abd: no nausea, vomiting, has abdominal pain, no diarrhea, constipation GU: no dysuria, burning on urination, increased  urinary frequency, hematuria  Ext: no leg edema Neuro: no unilateral weakness, numbness, or tingling, no vision change or hearing loss Skin: no rash MSK: No muscle spasm, no deformity, no limitation of range of movement in spin Heme: No easy bruising.  Travel history: No recent long distant travel.  Allergy: No Known Allergies  Past Medical History  Diagnosis Date  . GERD (gastroesophageal reflux disease)   . Dysphagia   . Alcohol abuse   . Anemia due to GI blood loss 09/2011    microcytic.  transfused for Hgb 3.5, MCV in 50s.   . Benign neoplasm of colon   . Cachexia ([place])   . Edema   . ILD (interstitial lung disease) ([place])   . Iron deficiency anemia, unspecified   . Pulmonary nodule   . Reflux esophagitis   . Tobacco abuse   . Weight loss, abnormal   . Smoker   . ED (erectile dysfunction)   . Aortic insufficiency     Past Surgical History  Procedure Laterality Date  . Esophagogastroduodenoscopy  10/02/2011    Procedure: ESOPHAGOGASTRODUODENOSCOPY (EGD);  Surgeon: Scarlette Shorts, MD;  Location: [facility] ENDOSCOPY;  Service: Endoscopy;  Laterality: N/A;  . Colonoscopy  10/03/2011    Procedure: COLONOSCOPY;  Surgeon: Scarlette Shorts, MD;  Location: [place];  Service: Endoscopy;  Laterality: N/A;  . Esophagogastroduodenoscopy N/A 01/05/2015    Procedure: ESOPHAGOGASTRODUODENOSCOPY (EGD);  Surgeon: Inda Castle, MD;  Location: [place];  Service: Endoscopy;  Laterality: N/A;    Social History:  reports that he has been smoking Cigarettes.  He has a 20 pack-year smoking history. He has never used smokeless tobacco. He reports that he drinks about 1.2  oz of alcohol per week. He reports that he uses illicit drugs (Marijuana).  Family History:  Family History  Problem Relation Age of Onset  . Hypertension Mother   . Diabetes Maternal Grandfather      Prior to Admission medications   Medication Sig Start Date End Date Taking? Authorizing Provider   amoxicillin-clarithromycin-lansoprazole [facility]) combo pack Take by mouth 2 (two) times daily. Follow package directions. 01/10/15   Inda Castle, MD  Avanafil (STENDRA) 100 MG TABS Take 100 capsules by mouth 1 day or 1 dose. 02/02/13   Denita Lung, MD  ferrous sulfate 325 (65 FE) MG tablet Take 1 tablet (325 mg total) by mouth 2 (two) times daily with a meal. 01/05/15   Donne Hazel, MD  folic acid (FOLVITE) 1 MG tablet Take 1 tablet (1 mg total) by mouth daily. 01/05/15   Donne Hazel, MD    Physical Exam: Filed Vitals:   08/21/15 0444 08/21/15 0445 08/21/15 0500 08/21/15 0515  BP: 122/69 126/75 127/84 116/71  Pulse: 82 90 95 92  Temp:      TempSrc:      Resp: 18     Height:      Weight:      SpO2: 99% 100% 100% 100%   General: Not in acute distress. Cachectic looking. HEENT:       Eyes: PERRL, EOMI, no scleral icterus.       ENT: No discharge from the ears and nose, no pharynx injection, no tonsillar enlargement.        Neck: No JVD, no bruit, no mass felt. Heme: No neck lymph node enlargement. Cardiac: S1/S2, RRR, tachycardia, No murmurs, No gallops or rubs. Pulm: No rales, wheezing, rhonchi or rubs. Abd: Soft, nondistended, tenderness over LUQ and epigastric area, no rebound pain, no organomegaly, BS present. Ext: No pitting leg edema bilaterally. 2+DP/PT pulse bilaterally. Musculoskeletal: No joint deformities, No joint redness or warmth, no limitation of ROM in spin. Skin: No rashes.  Neuro: Alert, oriented X3, cranial nerves II-XII grossly intact, muscle strength 5/5 in all extremities, sensation to light touch intact.  Psych: Patient is not psychotic, no suicidal or hemocidal ideation.  Labs on Admission:  Basic Metabolic Panel:  Recent Labs Lab 08/20/15 2206  NA 132*  K 3.7  CL 97*  CO2 21*  GLUCOSE 91  BUN <5*  CREATININE 0.61  CALCIUM 8.5*   Liver Function Tests: No results for input(s): AST, ALT, ALKPHOS, BILITOT, PROT, ALBUMIN in the last 168  hours.  Recent Labs Lab 08/20/15 2206  LIPASE 23   No results for input(s): AMMONIA in the last 168 hours. CBC:  Recent Labs Lab 08/20/15 2206  WBC 9.3  HGB 3.9*  HCT 15.6*  MCV 58.4*  PLT 363   Cardiac Enzymes: No results for input(s): CKTOTAL, CKMB, CKMBINDEX, TROPONINI in the last 168 hours.  BNP (last 3 results)  Recent Labs  01/01/15 1957  BNP 20.4    ProBNP (last 3 results) No results for input(s): PROBNP in the last 8760 hours.  CBG: No results for input(s): GLUCAP in the last 168 hours.  Radiological Exams on Admission: Dg Chest 2 View  08/20/2015  CLINICAL DATA:  Chest pain, shortness of breath, symptoms for 5 days but worsened today. EXAM: CHEST  2 VIEW COMPARISON:  CT chest 01/04/2015 and 01/02/2015. Chest radiograph 10/01/2011. FINDINGS: Diffuse hyperinflation suggesting emphysema. Normal heart size and pulmonary vascularity. No focal airspace disease or consolidation in the lungs. No blunting  of costophrenic angles. No pneumothorax. Mediastinal contours appear intact. IMPRESSION: Emphysematous changes in the chest. No evidence of active pulmonary disease. Electronically Signed   By: Lucienne Capers M.D.   On: 08/20/2015 22:28   Ct Abdomen Pelvis W Contrast  08/21/2015  CLINICAL DATA:  Left-sided and low back pain for several days. Generalize weakness. History of alcohol abuse, GI bleed, anemia and iron deficiency due to malnutrition. EXAM: CT ABDOMEN AND PELVIS WITH CONTRAST TECHNIQUE: Multidetector CT imaging of the abdomen and pelvis was performed using the standard protocol following bolus administration of intravenous contrast. CONTRAST:  176mL OMNIPAQUE IOHEXOL 300 MG/ML  SOLN COMPARISON:  01/02/2015 FINDINGS: Lung bases are clear. Diffuse fatty infiltration of the liver. The gallbladder, spleen, pancreas, adrenal glands, kidneys, abdominal aorta, inferior vena cava, and retroperitoneal lymph nodes are unremarkable. Diffuse gastric wall thickening similar  to prior study. This could indicate gastritis. Infiltrating neoplasm or lymphoma not excluded. Small bowel and colon are not abnormally distended. Contrast material flows through to the rectum without evidence of small or large bowel obstruction. No free air or free fluid in the abdomen. Pelvis: Appendix is normal. Bladder wall is not thickened. Prostate gland is not enlarged. No free or loculated pelvic fluid collections. No pelvic mass or lymphadenopathy. No destructive bone lesions. Vascular calcifications. IMPRESSION: No acute process demonstrated in the abdomen or pelvis. Gastric wall thickening is similar to previous study and likely to represent gastritis although infiltrating neoplasm or lymphoma could also potentially have this appearance. Diffuse fatty infiltration of the liver. Electronically Signed   By: Lucienne Capers M.D.   On: 08/21/2015 03:53    EKG: Independently reviewed. Tachycardia, QTC 479, no ischemic change   Assessment/Plan Principal Problem:   GIB (gastrointestinal bleeding) Active Problems:   Anemia due to GI blood loss   Tobacco abuse   Alcohol abuse   Iron deficiency anemia, unspecified   ILD (interstitial lung disease) (H[facility])   ETOH abuse   Severe protein-calorie malnutrition ([place])   Marijuana abuse  GIB: pt was was admited in 12/2014 due to GIB. He underwent EGD on 01/05/15 with no evidence of bleeding. 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 Pt was scheduled for outpt colonoscopy on 02/07/15, but this seems to have not done. He is likely to have upper GIB given dark stool, epigastric pain, and history of gastritis and alcohol abuse.   - will admit to SDU - GI consulted by Ed, will follow up recommendations - NPO for possible EGD - IVF: 1L of NS, then 125 mL/hr - transfuse 3 units of blood - Start IV pantoprazole infusion - Zofran IV for nausea - Avoid NSAIDs and SQ heparin - Maintain IV access (2 large bore IVs if  possible). - Monitor closely and follow q6h cbc, transfuse as necessary. - LaB: INR, PTT  CP: Etiology is not clear, likely due to demand ischemia versus epigastric pain from alcoholic gastritis. Troponins negative. EKG has no ischemic changes. -tele monitoring -trop x 3 -prn morphine for pain  AP: Most likely due to alcoholic gastritis. CT abdomen/pelvis is not impressive. Lipase normal. -When necessary morphine for pain -IV Protonix started  Iron deficiency anemia due to GI bleeding: -On iron supplement -3 units of blood was ordered  Tobacco abuse and Alcohol abuse: -Did counseling about importance of quitting smoking -Nicotine patch -Did counseling about the importance of quitting drinking -CIWA protocol  ILD (interstitial lung disease) ([place]): patient has productive cough. No wheezing or rhonchi on auscultation.  Chest x-ray showed emphysematous change without infiltration. -Atrovent nebs and prn albuterol nebs -mucinex prn for cough  Severe protein-calorie malnutrition ([place]): -consult to nutrition team  Hx of Marijuana abuse: --Did counseling about importance of quitting - check UDS  DVT ppx: SCD Code Status: Full code Family Communication: None at bed side.    Disposition Plan: Admit to inpatient   Date of Service 08/21/2015    Ivor Costa [facility] Pager [phone]  If 7PM-7AM, please contact night-coverage [email or web address] Password [email or web address] 08/21/2015, 5:35 AM

## 2015-08-21 NOTE — Progress Notes (Signed)
Patient has agreed to receive Pneumonia vaccination and decline to receive Flu vaccination.  HE reports to getting "sick" from Flu vaccination while serving in the [employer].  He is agreeable to receiving information on fluvaccination.  Richardean Canal RN, BSN, CCRN

## 2015-08-21 NOTE — Progress Notes (Signed)
Utilization review complete. Makya Yurko RN CCM Case Mgmt phone [phone] 

## 2015-08-21 NOTE — ED Notes (Signed)
CBG- 92 

## 2015-08-21 NOTE — ED Notes (Signed)
Attempted report to 2S, after Maralyn Sago previously attempted report at 89, [place] with Charge RN,  Stanton Kidney who advised that the nurse who will have the patient is coming from another floor and that she will call this RN to take report when she arrives.

## 2015-08-21 NOTE — ED Provider Notes (Signed)
CSN: [id number]     Arrival date & time 08/20/15  2150 History   First MD Initiated Contact with Patient 08/20/15 2241     Chief Complaint  Patient presents with  . Shortness of Breath  . Chest Pain     (Consider location/radiation/quality/duration/timing/severity/associated sxs/prior Treatment) Patient is a 59 y.o. male presenting with shortness of breath and chest pain. The history is provided by the patient.  Shortness of Breath Severity:  Severe Onset quality:  Gradual Duration:  1 week Timing:  Constant Progression:  Worsening Chronicity:  New Context: activity   Relieved by:  Rest Worsened by:  Activity Ineffective treatments:  None tried Associated symptoms: abdominal pain, chest pain and vomiting   Associated symptoms: no cough, no sputum production and no wheezing   Abdominal pain:    Location:  LLQ   Quality:  Sharp and shooting   Severity:  Moderate   Onset quality:  Gradual   Duration:  1 week   Timing:  Constant   Progression:  Worsening   Chronicity:  New Vomiting:    Quality:  Stomach contents   Number of occurrences:  1-2 times a day   Severity:  Mild   Duration:  2 months   Timing:  Constant   Progression:  Worsening Risk factors: alcohol use   Risk factors comment:  Hx of GI bleed in may requiring blood transfusion Chest Pain Associated symptoms: abdominal pain, shortness of breath and vomiting   Associated symptoms: no cough     Past Medical History  Diagnosis Date  . GERD (gastroesophageal reflux disease)   . Dysphagia   . Alcohol abuse   . Anemia due to GI blood loss 09/2011    microcytic.  transfused for Hgb 3.5, MCV in 50s.   . Benign neoplasm of colon   . Cachexia ([place])   . Edema   . ILD (interstitial lung disease) ([place])   . Iron deficiency anemia, unspecified   . Pulmonary nodule   . Reflux esophagitis   . Tobacco abuse   . Weight loss, abnormal   . Smoker   . ED (erectile dysfunction)   . Aortic insufficiency    Past  Surgical History  Procedure Laterality Date  . Esophagogastroduodenoscopy  10/02/2011    Procedure: ESOPHAGOGASTRODUODENOSCOPY (EGD);  Surgeon: Scarlette Shorts, MD;  Location: [facility] ENDOSCOPY;  Service: Endoscopy;  Laterality: N/A;  . Colonoscopy  10/03/2011    Procedure: COLONOSCOPY;  Surgeon: Scarlette Shorts, MD;  Location: [place];  Service: Endoscopy;  Laterality: N/A;  . Esophagogastroduodenoscopy N/A 01/05/2015    Procedure: ESOPHAGOGASTRODUODENOSCOPY (EGD);  Surgeon: Inda Castle, MD;  Location: [place];  Service: Endoscopy;  Laterality: N/A;   Family History  Problem Relation Age of Onset  . Hypertension Mother   . Diabetes Maternal Grandfather    Social History  Substance Use Topics  . Smoking status: Current Every Day Smoker -- 1.00 packs/day for 20 years    Types: Cigarettes  . Smokeless tobacco: Never Used     Comment: pt states he is down to 7 cigs per day  . Alcohol Use: 1.2 oz/week    2 Cans of beer per week    Review of Systems  Respiratory: Positive for shortness of breath. Negative for cough, sputum production and wheezing.   Cardiovascular: Positive for chest pain.  Gastrointestinal: Positive for vomiting and abdominal pain.       Black stool that last 3 days  All other systems reviewed and are negative.  Allergies  Review of patient's allergies indicates no known allergies.  Home Medications   Prior to Admission medications   Medication Sig Start Date End Date Taking? Authorizing Provider  amoxicillin-clarithromycin-lansoprazole [facility]) combo pack Take by mouth 2 (two) times daily. Follow package directions. 01/10/15   Inda Castle, MD  Avanafil (STENDRA) 100 MG TABS Take 100 capsules by mouth 1 day or 1 dose. 02/02/13   Denita Lung, MD  ferrous sulfate 325 (65 FE) MG tablet Take 1 tablet (325 mg total) by mouth 2 (two) times daily with a meal. 01/05/15   Donne Hazel, MD  folic acid (FOLVITE) 1 MG tablet Take 1 tablet (1 mg total) by mouth daily.  01/05/15   Donne Hazel, MD   BP 116/73 mmHg  Pulse 99  Temp(Src) 97.8 F (36.6 C) (Oral)  Resp 16  Ht 5\' 9"  (1.753 m)  Wt 94 lb 4.8 oz (42.774 kg)  BMI 13.92 kg/m2  SpO2 100% Physical Exam  Constitutional: He is oriented to person, place, and time. He appears well-developed. He appears cachectic. No distress.  HENT:  Head: Normocephalic and atraumatic.  Mouth/Throat: Oropharynx is clear and moist.  Eyes: EOM are normal. Pupils are equal, round, and reactive to light.  Pale conjunctiva  Neck: Normal range of motion. Neck supple.  Cardiovascular: Regular rhythm and intact distal pulses.  Tachycardia present.   No murmur heard. Pulmonary/Chest: Effort normal and breath sounds normal. No respiratory distress. He has no wheezes. He has no rales.  Abdominal: Soft. He exhibits no distension. There is tenderness in the left lower quadrant. There is guarding. There is no rebound and no CVA tenderness.  Genitourinary: Rectal exam shows no fissure and no mass. Guaiac positive stool.  Black stool  Musculoskeletal: Normal range of motion. He exhibits no edema or tenderness.  Neurological: He is alert and oriented to person, place, and time.  Skin: Skin is warm and dry. No rash noted. No erythema. There is pallor.  Psychiatric: He has a normal mood and affect. His behavior is normal.  Nursing note and vitals reviewed.   ED Course  Procedures (including critical care time) Labs Review Labs Reviewed  BASIC METABOLIC PANEL - Abnormal; Notable for the following:    Sodium 132 (*)    Chloride 97 (*)    CO2 21 (*)    BUN <5 (*)    Calcium 8.5 (*)    All other components within normal limits  CBC - Abnormal; Notable for the following:    RBC 2.67 (*)    Hemoglobin 3.9 (*)    HCT 15.6 (*)    MCV 58.4 (*)    MCH 14.6 (*)    MCHC 25.0 (*)    RDW 23.0 (*)    All other components within normal limits  HEPATIC FUNCTION PANEL - Abnormal; Notable for the following:    Total Protein 6.4 (*)     Albumin 2.6 (*)    Total Bilirubin 2.4 (*)    Bilirubin, Direct 0.6 (*)    Indirect Bilirubin 1.8 (*)    All other components within normal limits  CBC - Abnormal; Notable for the following:    RBC 3.96 (*)    Hemoglobin 8.6 (*)    HCT 28.6 (*)    MCV 72.2 (*)    MCH 21.7 (*)    All other components within normal limits  CBC - Abnormal; Notable for the following:    RBC 3.89 (*)  Hemoglobin 8.3 (*)    HCT 27.6 (*)    MCV 71.0 (*)    MCH 21.3 (*)    All other components within normal limits  URINE RAPID DRUG SCREEN, HOSP PERFORMED - Abnormal; Notable for the following:    Opiates POSITIVE (*)    Tetrahydrocannabinol POSITIVE (*)    All other components within normal limits  GLUCOSE, CAPILLARY - Abnormal; Notable for the following:    Glucose-Capillary 103 (*)    All other components within normal limits  POC OCCULT BLOOD, ED - Abnormal; Notable for the following:    Fecal Occult Bld POSITIVE (*)    All other components within normal limits  MRSA PCR SCREENING  LIPASE, BLOOD  PROTIME-INR  APTT  TROPONIN I  TROPONIN I  TROPONIN I  CBC  COMPREHENSIVE METABOLIC PANEL  CBC  I-STAT TROPOININ, ED  TYPE AND SCREEN  PREPARE RBC (CROSSMATCH)  PREPARE RBC (CROSSMATCH)    Imaging Review Dg Chest 2 View  08/20/2015  CLINICAL DATA:  Chest pain, shortness of breath, symptoms for 5 days but worsened today. EXAM: CHEST  2 VIEW COMPARISON:  CT chest 01/04/2015 and 01/02/2015. Chest radiograph 10/01/2011. FINDINGS: Diffuse hyperinflation suggesting emphysema. Normal heart size and pulmonary vascularity. No focal airspace disease or consolidation in the lungs. No blunting of costophrenic angles. No pneumothorax. Mediastinal contours appear intact. IMPRESSION: Emphysematous changes in the chest. No evidence of active pulmonary disease. Electronically Signed   By: Lucienne Capers M.D.   On: 08/20/2015 22:28   Ct Abdomen Pelvis W Contrast  08/21/2015  CLINICAL DATA:  Left-sided and  low back pain for several days. Generalize weakness. History of alcohol abuse, GI bleed, anemia and iron deficiency due to malnutrition. EXAM: CT ABDOMEN AND PELVIS WITH CONTRAST TECHNIQUE: Multidetector CT imaging of the abdomen and pelvis was performed using the standard protocol following bolus administration of intravenous contrast. CONTRAST:  149mL OMNIPAQUE IOHEXOL 300 MG/ML  SOLN COMPARISON:  01/02/2015 FINDINGS: Lung bases are clear. Diffuse fatty infiltration of the liver. The gallbladder, spleen, pancreas, adrenal glands, kidneys, abdominal aorta, inferior vena cava, and retroperitoneal lymph nodes are unremarkable. Diffuse gastric wall thickening similar to prior study. This could indicate gastritis. Infiltrating neoplasm or lymphoma not excluded. Small bowel and colon are not abnormally distended. Contrast material flows through to the rectum without evidence of small or large bowel obstruction. No free air or free fluid in the abdomen. Pelvis: Appendix is normal. Bladder wall is not thickened. Prostate gland is not enlarged. No free or loculated pelvic fluid collections. No pelvic mass or lymphadenopathy. No destructive bone lesions. Vascular calcifications. IMPRESSION: No acute process demonstrated in the abdomen or pelvis. Gastric wall thickening is similar to previous study and likely to represent gastritis although infiltrating neoplasm or lymphoma could also potentially have this appearance. Diffuse fatty infiltration of the liver. Electronically Signed   By: Lucienne Capers M.D.   On: 08/21/2015 03:53   I have personally reviewed and evaluated these images and lab results as part of my medical decision-making.   EKG Interpretation   Date/Time:  Monday August 20 2015 21:53:27 EST Ventricular Rate:  118 PR Interval:  138 QRS Duration: 80 QT Interval:  342 QTC Calculation: 479 R Axis:   78 Text Interpretation:  Sinus tachycardia Right atrial enlargement No  significant change since  last tracing Confirmed by Maryan Rued  MD, Loree Fee  [phone]) on 08/21/2015 12:13:27 AM      MDM   Final diagnoses:  Anemia, unspecified anemia type  Weight loss, unintentional    Patient is a 59 year old male with a history of alcohol abuse, GI bleeding with anemia with prior hemoglobin of 3.5 requiring 5 units of blood admit a did not follow-up with his outpatient colonoscopy but had a negative endoscopy who presents today with shortness of breath, chest pain and generalized weakness. For the last 1 week he's had worsening left-sided abdominal pain as well as dark stool.  Today patient is found to have a hemoglobin of 3.9. This is most likely the cause of his chest pain and shortness of breath which be demand ischemia. His troponin and EKG are without acute findings. His Hemoccult is positive. CMP is within normal limits. Lipase is within normal limits. Blood ordered to start transfusion for the anemia. Given patient's significant pain in his left lower quadrant which is new will also get a CT of the abdomen and pelvis for further evaluation.  Spoke with Dr. Watt Climes who will see the pt in the morning.  CRITICAL CARE Performed by: Blanchie Dessert Total critical care time: 30 minutes Critical care time was exclusive of separately billable procedures and treating other patients. Critical care was necessary to treat or prevent imminent or life-threatening deterioration. Critical care was time spent personally by me on the following activities: development of treatment plan with patient and/or surrogate as well as nursing, discussions with consultants, evaluation of patient's response to treatment, examination of patient, obtaining history from patient or surrogate, ordering and performing treatments and interventions, ordering and review of laboratory studies, ordering and review of radiographic studies, pulse oximetry and re-evaluation of patient's condition.   Blanchie Dessert, MD 08/21/15  2223

## 2015-08-22 ENCOUNTER — Inpatient Hospital Stay (HOSPITAL_COMMUNITY): Payer: BC Managed Care – PPO | Admitting: Critical Care Medicine

## 2015-08-22 ENCOUNTER — Encounter (HOSPITAL_COMMUNITY)
Admission: EM | Disposition: A | Discharge status: Home / Self Care | Payer: Self-pay | Source: Home / Self Care | Attending: Internal Medicine

## 2015-08-22 ENCOUNTER — Encounter (HOSPITAL_COMMUNITY): Payer: Self-pay | Admitting: Critical Care Medicine

## 2015-08-22 DIAGNOSIS — K2921 Alcoholic gastritis with bleeding: Secondary | ICD-10-CM

## 2015-08-22 DIAGNOSIS — D509 Iron deficiency anemia, unspecified: Secondary | ICD-10-CM

## 2015-08-22 DIAGNOSIS — Z720 Tobacco use: Secondary | ICD-10-CM

## 2015-08-22 HISTORY — PX: ESOPHAGOGASTRODUODENOSCOPY (EGD) WITH PROPOFOL: SHX5813

## 2015-08-22 HISTORY — PX: COLONOSCOPY WITH PROPOFOL: SHX5780

## 2015-08-22 LAB — CBC
HCT: 25.1 % — ABNORMAL LOW (ref 39.0–52.0)
HCT: 25.5 % — ABNORMAL LOW (ref 39.0–52.0)
HEMATOCRIT: 25 % — AB (ref 39.0–52.0)
HEMOGLOBIN: 7.8 g/dL — AB (ref 13.0–17.0)
Hemoglobin: 7.6 g/dL — ABNORMAL LOW (ref 13.0–17.0)
Hemoglobin: 7.8 g/dL — ABNORMAL LOW (ref 13.0–17.0)
MCH: 21.5 pg — AB (ref 26.0–34.0)
MCH: 22 pg — AB (ref 26.0–34.0)
MCH: 22.2 pg — AB (ref 26.0–34.0)
MCHC: 30.3 g/dL (ref 30.0–36.0)
MCHC: 30.6 g/dL (ref 30.0–36.0)
MCHC: 31.2 g/dL (ref 30.0–36.0)
MCV: 70.9 fL — AB (ref 78.0–100.0)
MCV: 72 fL — AB (ref 78.0–100.0)
MCV: 71.2 fL — AB (ref 78.0–100.0)
PLATELETS: 233 10*3/uL (ref 150–400)
PLATELETS: 257 10*3/uL (ref 150–400)
Platelets: 252 10*3/uL (ref 150–400)
RBC: 3.54 MIL/uL — AB (ref 4.22–5.81)
RBC: 3.51 MIL/uL — ABNORMAL LOW (ref 4.22–5.81)
RBC: 3.54 MIL/uL — AB (ref 4.22–5.81)
WBC: 8.4 10*3/uL (ref 4.0–10.5)
WBC: 7.8 10*3/uL (ref 4.0–10.5)
WBC: 6.7 10*3/uL (ref 4.0–10.5)

## 2015-08-22 LAB — COMPREHENSIVE METABOLIC PANEL
ALBUMIN: 2.2 g/dL — AB (ref 3.5–5.0)
ALT: 15 U/L — AB (ref 17–63)
AST: 21 U/L (ref 15–41)
Alkaline Phosphatase: 71 U/L (ref 38–126)
Anion gap: 7 (ref 5–15)
CHLORIDE: 102 mmol/L (ref 101–111)
CO2: 23 mmol/L (ref 22–32)
Calcium: 8.1 mg/dL — ABNORMAL LOW (ref 8.9–10.3)
Creatinine, Ser: 0.62 mg/dL (ref 0.61–1.24)
GFR calc Af Amer: >60 mL/min (ref >60)
GFR calc non Af Amer: >60 mL/min (ref >60)
GLUCOSE: 82 mg/dL (ref 65–99)
POTASSIUM: 3.8 mmol/L (ref 3.5–5.1)
Sodium: 132 mmol/L — ABNORMAL LOW (ref 135–145)
Total Bilirubin: 1 mg/dL (ref 0.3–1.2)
Total Protein: 5.5 g/dL — ABNORMAL LOW (ref 6.5–8.1)

## 2015-08-22 LAB — GLUCOSE, CAPILLARY
Glucose-Capillary: 92 mg/dL (ref 65–99)
Glucose-Capillary: 76 mg/dL (ref 65–99)

## 2015-08-22 SURGERY — COLONOSCOPY WITH PROPOFOL
Anesthesia: Monitor Anesthesia Care

## 2015-08-22 MED ORDER — LACTATED RINGERS IV SOLN
INTRAVENOUS | Status: DC | PRN
  Administered 2015-08-22: 12:00:00 via INTRAVENOUS

## 2015-08-22 MED ORDER — SODIUM CHLORIDE 0.9 % IV SOLN
INTRAVENOUS | Status: DC
  Administered 2015-08-22 – 2015-08-23 (×4): via INTRAVENOUS

## 2015-08-22 MED ORDER — OXYCODONE HCL 5 MG/5ML PO SOLN: 5.0000 mg | Freq: Once | ORAL | Status: DC | PRN

## 2015-08-22 MED ORDER — PROPOFOL 500 MG/50ML IV EMUL
INTRAVENOUS | Status: DC | PRN
  Administered 2015-08-22: 150 ug/kg/min via INTRAVENOUS

## 2015-08-22 MED ORDER — GUAIFENESIN ER 600 MG PO TB12: 600.0000 mg | ORAL_TABLET | Freq: Two times a day (BID) | ORAL | Status: DC | PRN

## 2015-08-22 MED ORDER — PHENYLEPHRINE HCL 10 MG/ML IJ SOLN
INTRAMUSCULAR | Status: DC | PRN
  Administered 2015-08-22 (×2): 80 ug via INTRAVENOUS

## 2015-08-22 MED ORDER — FENTANYL CITRATE (PF) 100 MCG/2ML IJ SOLN: 25.0000 ug | INTRAMUSCULAR | Status: DC | PRN

## 2015-08-22 MED ORDER — MIDAZOLAM HCL 5 MG/5ML IJ SOLN
INTRAMUSCULAR | Status: DC | PRN
  Administered 2015-08-22: 2 mg via INTRAVENOUS

## 2015-08-22 MED ORDER — ALBUTEROL SULFATE (2.5 MG/3ML) 0.083% IN NEBU: 2.5000 mg | INHALATION_SOLUTION | RESPIRATORY_TRACT | Status: DC | PRN

## 2015-08-22 MED ORDER — OXYCODONE HCL 5 MG PO TABS: 5.0000 mg | ORAL_TABLET | Freq: Once | ORAL | Status: DC | PRN

## 2015-08-22 MED ORDER — ONDANSETRON HCL 4 MG/2ML IJ SOLN: 4.0000 mg | Freq: Four times a day (QID) | INTRAMUSCULAR | Status: DC | PRN

## 2015-08-22 MED ORDER — PROPOFOL 10 MG/ML IV BOLUS
INTRAVENOUS | Status: DC | PRN
  Administered 2015-08-22 (×2): 10 mg via INTRAVENOUS
  Administered 2015-08-22: 20 mg via INTRAVENOUS
  Administered 2015-08-22 (×2): 10 mg via INTRAVENOUS

## 2015-08-22 MED ORDER — BUTAMBEN-TETRACAINE-BENZOCAINE 2-2-14 % EX AERO
INHALATION_SPRAY | CUTANEOUS | Status: DC | PRN
  Administered 2015-08-22: 2 via TOPICAL

## 2015-08-22 NOTE — Evaluation (Signed)
Occupational Therapy Evaluation Patient Details Name: RICHARD SQUYRES MRN: [id number] DOB: 04/04/1956 Today's Date: 08/22/2015    History of Present Illness  This 59 y.o. Male admitted with one week h/o chest pain, SOB, abdominal pain, and black stool.   Hbg in ED 3.9.  Chest x-ray showed emphysematous change without infiltration.  CT abdomen/pelvis negative for acute abnormalities.  Work up underway.  PMH includes:  Polysubstance abuse, Fe deficiency anemia, pulmonary nodule, aortic insufficiency, ILD, CERD.    Clinical Impression   Patient evaluated by Occupational Therapy with no further acute OT needs identified. All education has been completed and the patient has no further questions. Pt is independent with ADLs - appears to be at baseline.  See below for any follow-up Occupational Therapy or equipment needs. OT is signing off. Thank you for this referral.      Follow Up Recommendations  No OT follow up    Equipment Recommendations  None recommended by OT    Recommendations for Other Services       Precautions / Restrictions Precautions Precautions: None      Mobility Bed Mobility Overal bed mobility: Independent                Transfers Overall transfer level: Independent                    Balance Overall balance assessment: Independent                                          ADL Overall ADL's : Independent                                             Vision     Perception     Praxis      Pertinent Vitals/Pain Pain Assessment: No/denies pain     Hand Dominance Right   Extremity/Trunk Assessment Upper Extremity Assessment Upper Extremity Assessment: Overall WFL for tasks assessed   Lower Extremity Assessment Lower Extremity Assessment: Overall WFL for tasks assessed   Cervical / Trunk Assessment Cervical / Trunk Assessment: Normal   Communication     Cognition Arousal/Alertness:  Awake/alert Behavior During Therapy: WFL for tasks assessed/performed Overall Cognitive Status: Within Functional Limits for tasks assessed                     General Comments       Exercises       Shoulder Instructions      Home Living Family/patient expects to be discharged to:: Private residence Living Arrangements: Spouse/significant other;Children Available Help at Discharge: Family;Available PRN/intermittently Type of Home: Apartment Home Access: Level entry     Home Layout: One level     Bathroom Shower/Tub: Tub/shower unit;Curtain Shower/tub characteristics: Architectural technologist: Standard     Home Equipment: None          Prior Functioning/Environment Level of Independence: Independent        Comments: Pt does not drive. Uses public transportation     OT Diagnosis:     OT Problem List:     OT Treatment/Interventions:      OT Goals(Current goals can be found in the care plan section) Acute Rehab OT Goals Patient Stated Goal: to go home  OT Goal Formulation: All assessment and education complete, DC therapy  OT Frequency:     Barriers to D/C:            Co-evaluation              End of Session Nurse Communication: Mobility status  Activity Tolerance: Patient tolerated treatment well Patient left: in bed;with call bell/phone within reach   Time: [id number] OT Time Calculation (min): 15 min Charges:  OT General Charges $OT Visit: 1 Procedure OT Evaluation $Initial OT Evaluation Tier I: 1 Procedure G-Codes:    Kobie Matkins, Ellard Artis M 09-19-2015, 2:26 PM

## 2015-08-22 NOTE — Evaluation (Addendum)
Physical Therapy Evaluation and DISCHARGE Patient Details Name: James [name] MRN: [id number] DOB: Jul 02, 1956 Today's Date: 08/22/2015   History of Present Illness   This 59 y.o. Male admitted with one week h/o chest pain, SOB, abdominal pain, and black stool.   Hbg in ED 3.9.  Chest x-ray showed emphysematous change without infiltration.  CT abdomen/pelvis negative for acute abnormalities.  Work up underway.  PMH includes:  Polysubstance abuse, Fe deficiency anemia, pulmonary nodule, aortic insufficiency, ILD, CERD.   Clinical Impression  Pt is at or close to baseline functioning and should be safe at home. There are no further acute PT needs.  Will sign off at this time.     Follow Up Recommendations No PT follow up    Equipment Recommendations  None recommended by PT    Recommendations for Other Services       Precautions / Restrictions Precautions Precautions: None      Mobility  Bed Mobility Overal bed mobility: Independent                Transfers Overall transfer level: Independent                  Ambulation/Gait Ambulation/Gait assistance: Independent Ambulation Distance (Feet): 400 Feet Assistive device: None Gait Pattern/deviations: Step-through pattern   Gait velocity interpretation: at or above normal speed for age/gender General Gait Details: Steady except some unsteadiness/wandering with scanning horizontally and vertically  Stairs Stairs: Yes Stairs assistance: Modified independent (Device/Increase time) Stair Management: One rail Right;Alternating pattern;Forwards Number of Stairs: 4 General stair comments: safe with rails  Wheelchair Mobility    Modified Rankin (Stroke Patients Only)       Balance Overall balance assessment: No apparent balance deficits (not formally assessed) (see DGI)                               Standardized Balance Assessment Standardized Balance Assessment : Dynamic Gait Index    Dynamic Gait Index Level Surface: Normal Change in Gait Speed: Normal Gait with Horizontal Head Turns: Mild Impairment Gait with Vertical Head Turns: Mild Impairment Gait and Pivot Turn: Normal Step Over Obstacle: Normal Step Around Obstacles: Normal Steps: Mild Impairment Total Score: 21       Pertinent Vitals/Pain Pain Assessment: No/denies pain    Home Living Family/patient expects to be discharged to:: Private residence Living Arrangements: Spouse/significant other;Children Available Help at Discharge: Family;Available PRN/intermittently Type of Home: Apartment Home Access: Level entry     Home Layout: One level Home Equipment: None      Prior Function Level of Independence: Independent         Comments: Pt does not drive. Uses public transportation      Hand Dominance   Dominant Hand: Right    Extremity/Trunk Assessment   Upper Extremity Assessment: Defer to OT evaluation           Lower Extremity Assessment: Overall WFL for tasks assessed      Cervical / Trunk Assessment: Normal  Communication   Communication: No difficulties  Cognition Arousal/Alertness: Awake/alert Behavior During Therapy: WFL for tasks assessed/performed Overall Cognitive Status: Within Functional Limits for tasks assessed                      General Comments General comments (skin integrity, edema, etc.): vss    Exercises        Assessment/Plan    PT Assessment Patent does  not need any further PT services  PT Diagnosis     PT Problem List    PT Treatment Interventions     PT Goals (Current goals can be found in the Care Plan section) Acute Rehab PT Goals Patient Stated Goal: to go home     Frequency     Barriers to discharge        Co-evaluation               End of Session   Activity Tolerance: Patient tolerated treatment well Patient left: in bed;with call bell/phone within reach Nurse Communication: Mobility status          Time: [id number] PT Time Calculation (min) (ACUTE ONLY): 14 min   Charges:   PT Evaluation $Initial PT Evaluation Tier I: 1 Procedure     PT G Codes:        Shannette Tabares, Tessie Fass 08/22/2015, 2:32 PM 08/22/2015  [name], PT [phone]  (pager)

## 2015-08-22 NOTE — Progress Notes (Signed)
Received report from [place], [place] from 2S.

## 2015-08-22 NOTE — Interval H&P Note (Signed)
History and Physical Interval Note:  08/22/2015 11:33 AM  James [name]  has presented today for surgery, with the diagnosis of Black stool and anemia  The various methods of treatment have been discussed with the patient and family. After consideration of risks, benefits and other options for treatment, the patient has consented to  Procedure(s): COLONOSCOPY WITH PROPOFOL (N/A) ESOPHAGOGASTRODUODENOSCOPY (EGD) WITH PROPOFOL (N/A) as a surgical intervention .  The patient's history has been reviewed, patient examined, no change in status, stable for surgery.  I have reviewed the patient's chart and labs.  Questions were answered to the patient's satisfaction.     [place] C.

## 2015-08-22 NOTE — Progress Notes (Signed)
Per charting, patient has not voided since 3AM.  Per patient, he has voided several times since then.  Patient states he feels like he may have to void "a little bit" but he would like to drink his Boost and then try.  Will monitor patient.

## 2015-08-22 NOTE — H&P (View-Only) (Signed)
[facility] Consult Note  Referring Provider: No ref. provider found Primary Care Physician:  Wyatt Haste, MD Primary Gastroenterologist:  Dr.  Laurel Dimmer Complaint: Chest pain and black stool HPI: James [name] is an 59 y.o. black male  who presents with left-sided chest pain weakness orthostasis and in the last 2 days dark stool. He had a hemoglobin of 3.9. He had a colonoscopy in April at which time he had a hemoglobin of approximately 9 which showed enlarged gastric folds and was otherwise normal. He had a colonoscopy in 2013 that showed multiple polyps.   Past Medical History  Diagnosis Date  . GERD (gastroesophageal reflux disease)   . Dysphagia   . Alcohol abuse   . Anemia due to GI blood loss 09/2011    microcytic.  transfused for Hgb 3.5, MCV in 50s.   . Benign neoplasm of colon   . Cachexia ([place])   . Edema   . ILD (interstitial lung disease) ([place])   . Iron deficiency anemia, unspecified   . Pulmonary nodule   . Reflux esophagitis   . Tobacco abuse   . Weight loss, abnormal   . Smoker   . ED (erectile dysfunction)   . Aortic insufficiency     Past Surgical History  Procedure Laterality Date  . Esophagogastroduodenoscopy  10/02/2011    Procedure: ESOPHAGOGASTRODUODENOSCOPY (EGD);  Surgeon: Scarlette Shorts, MD;  Location[facility] ENDOSCOPY;  Service: Endoscopy;  Laterality: N/A;  . Colonoscopy  10/03/2011    Procedure: COLONOSCOPY;  Surgeon: Scarlette Shorts, MD;  Location: [facility];  Service: Endoscopy;  Laterality: N/A;  . Esophagogastroduodenoscopy N/A 01/05/2015    Procedure: ESOPHAGOGASTRODUODENOSCOPY (EGD);  Surgeon: Inda Castle, MD;  Location: [facility];  Service: Endoscopy;  Laterality: N/A;     (Not in a hospital admission)  Allergies: No Known Allergies  Family History  Problem Relation Age of Onset  . Hypertension Mother   . Diabetes Maternal Grandfather     Social History:  reports that he has been smoking Cigarettes.  He has a 20  pack-year smoking history. He has never used smokeless tobacco. He reports that he drinks about 1.2 oz of alcohol per week. He reports that he uses illicit drugs (Marijuana).  Review of Systems: negative except as above   Blood pressure 112/68, pulse 78, temperature 97.9 F (36.6 C), temperature source Oral, resp. rate 17, height 5' 9"  (1.753 m), weight 42.774 kg (94 lb 4.8 oz), SpO2 100 %. Head: Normocephalic, without obvious abnormality, atraumatic Neck: no adenopathy, no carotid bruit, no JVD, supple, symmetrical, trachea midline and thyroid not enlarged, symmetric, no tenderness/mass/nodules Resp: clear to auscultation bilaterally Cardio: regular rate and rhythm, S1, S2 normal, no murmur, click, rub or gallop GI: Abdomen soft nondistended with normoactive bowel sounds. No spider megaly masses or guarding Extremities: extremities normal, atraumatic, no cyanosis or edema  Results for orders placed or performed during the hospital encounter of 08/20/15 (from the past 48 hour(s))  Basic metabolic panel     Status: Abnormal   Collection Time: 08/20/15 10:06 PM  Result Value Ref Range   Sodium 132 (L) 135 - 145 mmol/L   Potassium 3.7 3.5 - 5.1 mmol/L   Chloride 97 (L) 101 - 111 mmol/L   CO2 21 (L) 22 - 32 mmol/L   Glucose, Bld 91 65 - 99 mg/dL   BUN <5 (L) 6 - 20 mg/dL   Creatinine, Ser 0.61 0.61 - 1.24 mg/dL   Calcium 8.5 (L) 8.9 - 10.3 mg/dL  GFR calc non Af Amer >60 >60 mL/min   GFR calc Af Amer >60 >60 mL/min    Comment: (NOTE) The eGFR has been calculated using the CKD EPI equation. This calculation has not been validated in all clinical situations. eGFR's persistently <60 mL/min signify possible Chronic Kidney Disease.    Anion gap 14 5 - 15  CBC     Status: Abnormal   Collection Time: 08/20/15 10:06 PM  Result Value Ref Range   WBC 9.3 4.0 - 10.5 K/uL   RBC 2.67 (L) 4.22 - 5.81 MIL/uL   Hemoglobin 3.9 (LL) 13.0 - 17.0 g/dL    Comment: REPEATED TO VERIFY CRITICAL RESULT  CALLED TO, READ BACK BY AND VERIFIED WITH: Thomes Lolling 2237 08/20/15 D BRADLEY QUESTIONABLE RESULTS, RECOMMEND RECOLLECT TO VERIFY    HCT 15.6 (L) 39.0 - 52.0 %   MCV 58.4 (L) 78.0 - 100.0 fL   MCH 14.6 (L) 26.0 - 34.0 pg   MCHC 25.0 (L) 30.0 - 36.0 g/dL   RDW 23.0 (H) 11.5 - 15.5 %   Platelets 363 150 - 400 K/uL  Lipase, blood     Status: None   Collection Time: 08/20/15 10:06 PM  Result Value Ref Range   Lipase 23 11 - 51 U/L  I-stat troponin, ED (not at [facility], [facility])     Status: None   Collection Time: 08/20/15 10:14 PM  Result Value Ref Range   Troponin i, poc 0.00 0.00 - 0.08 ng/mL   Comment 3            Comment: Due to the release kinetics of cTnI, a negative result within the first hours of the onset of symptoms does not rule out myocardial infarction with certainty. If myocardial infarction is still suspected, repeat the test at appropriate intervals.   Prepare RBC     Status: None   Collection Time: 08/20/15 10:47 PM  Result Value Ref Range   Order Confirmation ORDER PROCESSED BY BLOOD BANK   Type and screen [place]     Status: None (Preliminary result)   Collection Time: 08/20/15 11:00 PM  Result Value Ref Range   ABO/RH(D) O POS    Antibody Screen NEG    Sample Expiration 08/23/2015    Unit Number [id number]    Blood Component Type RED CELLS,LR    Unit division 00    Status of Unit ISSUED    Transfusion Status OK TO TRANSFUSE    Crossmatch Result Compatible    Unit Number [id number]    Blood Component Type RED CELLS,LR    Unit division 00    Status of Unit ISSUED    Transfusion Status OK TO TRANSFUSE    Crossmatch Result Compatible   POC occult blood, ED     Status: Abnormal   Collection Time: 08/20/15 11:22 PM  Result Value Ref Range   Fecal Occult Bld POSITIVE (A) NEGATIVE  Protime-INR     Status: None   Collection Time: 08/21/15  5:27 AM  Result Value Ref Range   Prothrombin Time 14.3 11.6 - 15.2 seconds   INR 1.09 0.00 -  1.49  APTT     Status: None   Collection Time: 08/21/15  5:27 AM  Result Value Ref Range   aPTT 32 24 - 37 seconds  Hepatic function panel     Status: Abnormal   Collection Time: 08/21/15  5:27 AM  Result Value Ref Range   Total Protein 6.4 (L) 6.5 - 8.1  g/dL   Albumin 2.6 (L) 3.5 - 5.0 g/dL   AST 27 15 - 41 U/L   ALT 17 17 - 63 U/L   Alkaline Phosphatase 80 38 - 126 U/L   Total Bilirubin 2.4 (H) 0.3 - 1.2 mg/dL   Bilirubin, Direct 0.6 (H) 0.1 - 0.5 mg/dL   Indirect Bilirubin 1.8 (H) 0.3 - 0.9 mg/dL  Troponin I (q 6hr x 3)     Status: None   Collection Time: 08/21/15  5:27 AM  Result Value Ref Range   Troponin I <0.03 <0.031 ng/mL    Comment:        NO INDICATION OF MYOCARDIAL INJURY.    Dg Chest 2 View  08/20/2015  CLINICAL DATA:  Chest pain, shortness of breath, symptoms for 5 days but worsened today. EXAM: CHEST  2 VIEW COMPARISON:  CT chest 01/04/2015 and 01/02/2015. Chest radiograph 10/01/2011. FINDINGS: Diffuse hyperinflation suggesting emphysema. Normal heart size and pulmonary vascularity. No focal airspace disease or consolidation in the lungs. No blunting of costophrenic angles. No pneumothorax. Mediastinal contours appear intact. IMPRESSION: Emphysematous changes in the chest. No evidence of active pulmonary disease. Electronically Signed   By: Lucienne Capers M.D.   On: 08/20/2015 22:28   Ct Abdomen Pelvis W Contrast  08/21/2015  CLINICAL DATA:  Left-sided and low back pain for several days. Generalize weakness. History of alcohol abuse, GI bleed, anemia and iron deficiency due to malnutrition. EXAM: CT ABDOMEN AND PELVIS WITH CONTRAST TECHNIQUE: Multidetector CT imaging of the abdomen and pelvis was performed using the standard protocol following bolus administration of intravenous contrast. CONTRAST:  141m OMNIPAQUE IOHEXOL 300 MG/ML  SOLN COMPARISON:  01/02/2015 FINDINGS: Lung bases are clear. Diffuse fatty infiltration of the liver. The gallbladder, spleen, pancreas,  adrenal glands, kidneys, abdominal aorta, inferior vena cava, and retroperitoneal lymph nodes are unremarkable. Diffuse gastric wall thickening similar to prior study. This could indicate gastritis. Infiltrating neoplasm or lymphoma not excluded. Small bowel and colon are not abnormally distended. Contrast material flows through to the rectum without evidence of small or large bowel obstruction. No free air or free fluid in the abdomen. Pelvis: Appendix is normal. Bladder wall is not thickened. Prostate gland is not enlarged. No free or loculated pelvic fluid collections. No pelvic mass or lymphadenopathy. No destructive bone lesions. Vascular calcifications. IMPRESSION: No acute process demonstrated in the abdomen or pelvis. Gastric wall thickening is similar to previous study and likely to represent gastritis although infiltrating neoplasm or lymphoma could also potentially have this appearance. Diffuse fatty infiltration of the liver. Electronically Signed   By: WLucienne CapersM.D.   On: 08/21/2015 03:53    Assessment: Chest pain with negative troponins and normal EKG Anemia with dark stools with significant drop in hemoglobin since April. Plan:  Agree with transfusion of 3 units packed red blood cells IV proton pump inhibitor Will plan colonoscopy and EGD at noon tomorrow. Jahad Old C 08/21/2015, 8:39 AM  Pager [phone] no answer or after 5 PM call [phone]

## 2015-08-22 NOTE — Transfer of Care (Signed)
Immediate Anesthesia Transfer of Care Note  Patient: James [name]  Procedure(s) Performed: Procedure(s): COLONOSCOPY WITH PROPOFOL (N/A) ESOPHAGOGASTRODUODENOSCOPY (EGD) WITH PROPOFOL (N/A)  Patient Location: Endoscopy Unit  Anesthesia Type:MAC  Level of Consciousness: awake and alert   Airway & Oxygen Therapy: Patient Spontanous Breathing and Patient connected to nasal cannula oxygen  Post-op Assessment: Report given to RN  Post vital signs: Reviewed and stable  Last Vitals:  Filed Vitals:   08/22/15 0900 08/22/15 1121  BP: 103/61 117/80  Pulse: 77 70  Temp:  36.5 C  Resp: 18 18    Complications: No apparent anesthesia complications

## 2015-08-22 NOTE — Anesthesia Preprocedure Evaluation (Addendum)
Anesthesia Evaluation  Patient identified by MRN, date of birth, ID band Patient awake    Reviewed: Allergy & Precautions, NPO status , Patient's Chart, lab work & pertinent test results  Airway Mallampati: II   Neck ROM: full    Dental  (+) Dental Advisory Given, Poor Dentition   Pulmonary shortness of breath, Current Smoker,    breath sounds clear to auscultation       Cardiovascular negative cardio ROS   Rhythm:irregular Rate:Tachycardia     Neuro/Psych    GI/Hepatic GERD  ,(+)     substance abuse  alcohol use,   Endo/Other    Renal/GU      Musculoskeletal   Abdominal   Peds  Hematology  (+) anemia ,   Anesthesia Other Findings   Reproductive/Obstetrics                           Anesthesia Physical Anesthesia Plan  ASA: II  Anesthesia Plan: MAC   Post-op Pain Management:    Induction: Intravenous  Airway Management Planned: Nasal Cannula  Additional Equipment:   Intra-op Plan:   Post-operative Plan:   Informed Consent: I have reviewed the patients History and Physical, chart, labs and discussed the procedure including the risks, benefits and alternatives for the proposed anesthesia with the patient or authorized representative who has indicated his/her understanding and acceptance.   Dental advisory given  Plan Discussed with: Anesthesiologist and Surgeon  Anesthesia Plan Comments:         Anesthesia Quick Evaluation

## 2015-08-22 NOTE — Anesthesia Procedure Notes (Signed)
Procedure Name: MAC Date/Time: 08/22/2015 12:00 PM Performed by: Merrilyn Puma B Pre-anesthesia Checklist: Emergency Drugs available, Suction available, Patient identified, Patient being monitored and Timeout performed Patient Re-evaluated:Patient Re-evaluated prior to inductionOxygen Delivery Method: Nasal cannula Preoxygenation: Pre-oxygenation with 100% oxygen Intubation Type: IV induction Placement Confirmation: positive ETCO2 and breath sounds checked- equal and bilateral Dental Injury: Teeth and Oropharynx as per pre-operative assessment

## 2015-08-22 NOTE — Progress Notes (Signed)
NURSING PROGRESS NOTE  James [name]  MRN: [id number] Transfer Data: 08/22/2015 8:55 PM Attending Provider: Cherene Altes, MD  PCP: Wyatt Haste, MD  Code status: None  Allergies: No Known Allergies   Past Medical History:  has a past medical history of GERD (gastroesophageal reflux disease); Dysphagia; Alcohol abuse; Anemia due to GI blood loss (09/2011); Benign neoplasm of colon; Cachexia ([place]); Edema; ILD (interstitial lung disease) ([place]); Iron deficiency anemia, unspecified; Pulmonary nodule; Reflux esophagitis; Tobacco abuse; Weight loss, abnormal; Smoker; ED (erectile dysfunction); Aortic insufficiency; Black stools (08/21/2015); and Chest pain (08/21/2015).   Past Surgical History:  has past surgical history that includes Esophagogastroduodenoscopy (10/02/2011); Colonoscopy (10/03/2011); and Esophagogastroduodenoscopy (N/A, 01/05/2015).   James [name] is a 59 y.o. y.o. male patient, arrived to floor in room [phone] via bed, transferred from 2S. Patient alert and oriented X 4. No acute distress noted. Denies pain.   Vital signs: Oral temperature 97.9 F (36.6 C), Blood pressure 108/56, Pulse 73, RR 20, SpO2 100 % on room air. Height 5'9" (175.3 m), weight 110.9 lbs (50.304 kg).   Cardiac monitoring: Telemetry box 5W #  26 in place, verified with Rodman Pickle NT  IV access: Right forearm and Left forearm; condition patent and no redness.   Skin: intact, no pressure ulcer noted in sacral area.   Patient's ID armband verified with patient/ family, and in place. Information packet given to patient/ family. Fall risk assessed, SR up X2, patient/ family able to verbalize understanding of risks associated with falls and to call nurse or staff to assist before getting out of bed. Patient/ family oriented to room and equipment. Call bell within reach.

## 2015-08-22 NOTE — Progress Notes (Signed)
[place] TEAM 1 - Stepdown/ICU TEAM PROGRESS NOTE  James [name] [id number] DOB: Feb 21, 1956 DOA: 08/20/2015 PCP: Wyatt Haste, MD  Admit HPI / Brief Narrative: 59 y.o. M Hx Polysubstance Substance Abuse (marijuana, EtOH, opiates) Fe Deficiency anemia, Cachexia, Tobacco Abuse, Pulmonary Nodule, Aortic Insufficiency, ILD, and GERD who presented w/ a one week hx of chest pain, shortness of breath, abdominal pain, and black stool.  He denied hematemesis or hematochezia.   In ED, patient was found to have hemoglobin of 3.9, positive FOBT, tachycardia. Chest x-ray showed emphysematous change without infiltration. CT abdomen/pelvis was negative for acute abnormalities.   HPI/Subjective: The pt is resting comfortably.  He completed his prep last night, and states that he has passed only clear liquid stool since then.  He denies current CP, n/v, or abdom pain.  No HA or sob.    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  -was scheduled for outpt colonoscopy on 02/07/15 but appears this did not happen -coags normal  -for EGD and colo today   Acute blood loss anemia due to GI bleeding -s/p 4U PRBC thus far - Hgb appears to be stabilizing - follow in serial fashion - will plan to load w/ IV Fe prior to d/c  -goal is to keep Hgb >6.9   Polysubstance abuse - EtOH abuse - Tob abuse  -UDS positive for opiates and marijuana - CSW consult to assist pt in identifying resources to assist him in abstaining -pt has been counseled on importance of stopping all of these substances   Fatty liver -noted on CT abdom - due to EtOH abuse - pt has been advised to abstain from EtOH  CP -likely epigastric pain from alcoholic gastritis - has resolved at this time  -trop x 3 negative - EKG w/o ischemic change  Epigastric Abdominal pain  -likely due to alcoholic gastritis - CT  abdomen/pelvis not impressive - lipase normal - sx have resolved   ILD -stable from a pulmonary standpoint  Severe protein-calorie malnutrition  -Nutrition consulted - supplement added to regimen  Code Status: FULL Family Communication: no family present at time of exam Disposition Plan: SDU - possible transfer to med bed after procedures depending upon results   Consultants: [facility]  Procedures: none  Antibiotics: none  DVT prophylaxis: SCDs  Objective: Blood pressure 107/63, pulse 79, temperature 98.4 F (36.9 C), temperature source Oral, resp. rate 20, height 5\' 9"  (1.753 m), weight 46.4 kg (102 lb 4.7 oz), SpO2 100 %.  Intake/Output Summary (Last 24 hours) at 08/22/15 0858 Last data filed at 08/22/15 0800  Gross per 24 hour  Intake 2532.92 ml  Output    750 ml  Net 1782.92 ml   Exam: General: No acute respiratory distress - alert and conversant - very thin  Lungs: Clear to auscultation bilaterally without wheezes or crackles Cardiovascular: Regular rate and rhythm without murmur gallop or rub normal S1 and S2 Abdomen: Nontender, nondistended, soft, bowel sounds positive, no rebound, no ascites, no appreciable mass Extremities: No significant cyanosis, clubbing, or edema bilateral lower extremities  Data Reviewed: Basic Metabolic Panel:  Recent Labs Lab 08/20/15 2206 08/22/15 0612  NA 132* 132*  K 3.7 3.8  CL 97* 102  CO2 21* 23  GLUCOSE 91 82  BUN <5* <5*  CREATININE 0.61 0.62  CALCIUM 8.5* 8.1*    CBC:  Recent Labs Lab 08/20/15  2206 08/21/15 1340 08/21/15 1817 08/21/15 2349 08/22/15 0612  WBC 9.3 8.4 9.0 8.4 7.8  HGB 3.9* 8.6* 8.3* 7.6* 7.8*  HCT 15.6* 28.6* 27.6* 25.1* 25.0*  MCV 58.4* 72.2* 71.0* 70.9* 71.2*  PLT 363 245 248 233 252    Liver Function Tests:  Recent Labs Lab 08/21/15 0527 08/22/15 0612  AST 27 21  ALT 17 15*  ALKPHOS 80 71  BILITOT 2.4* 1.0  PROT 6.4* 5.5*  ALBUMIN 2.6* 2.2*    Recent Labs Lab  08/20/15 2206  LIPASE 23   Coags:  Recent Labs Lab 08/21/15 0527  INR 1.09    Recent Labs Lab 08/21/15 0527  APTT 32    Cardiac Enzymes:  Recent Labs Lab 08/21/15 0527 08/21/15 1340 08/21/15 1817  TROPONINI <0.03 <0.03 <0.03    CBG:  Recent Labs Lab 08/21/15 0821 08/21/15 0948  GLUCAP 92 103*    Recent Results (from the past 240 hour(s))  MRSA PCR Screening     Status: None   Collection Time: 08/21/15  9:20 AM  Result Value Ref Range Status   MRSA by PCR NEGATIVE NEGATIVE Final    Comment:        The GeneXpert MRSA Assay (FDA approved for NASAL specimens only), is one component of a comprehensive MRSA colonization surveillance program. It is not intended to diagnose MRSA infection nor to guide or monitor treatment for MRSA infections.      Studies:   Recent x-ray studies have been reviewed in detail by the Attending Physician  Scheduled Meds:  Scheduled Meds: . sodium chloride   Intravenous Once  . antiseptic oral rinse  7 mL Mouth Rinse q12n4p  . chlorhexidine  15 mL Mouth Rinse BID  . feeding supplement  1 Container Oral TID BM  . ferrous sulfate  325 mg Oral BID WC  . folic acid  1 mg Oral Daily  . guaiFENesin  600 mg Oral BID  . ipratropium  0.5 mg Nebulization Q6H  . LORazepam  0-4 mg Intravenous Q6H   Followed by  . [START ON 08/23/2015] LORazepam  0-4 mg Intravenous Q12H  . multivitamin with minerals  1 tablet Oral Daily  . nicotine  21 mg Transdermal Daily  . [START ON 08/24/2015] pantoprazole (PROTONIX) IV  40 mg Intravenous Q12H  . pneumococcal 23 valent vaccine  0.5 mL Intramuscular Tomorrow-1000  . sodium chloride  3 mL Intravenous Q12H  . thiamine  100 mg Oral Daily   Or  . thiamine  100 mg Intravenous Daily    Time spent on care of this patient: 35 mins   MCCLUNG,JEFFREY T , MD   [facility] Office  [phone] Pager - Text Page per Shea Evans as per below:  On-Call/Text Page:      [email or web address]      password  [email or web address]  If 7PM-7AM, please contact night-coverage [email or web address] Password [email or web address] 08/22/2015, 8:58 AM   LOS: 1 day

## 2015-08-22 NOTE — Progress Notes (Signed)
PT Cancellation Note  Patient Details Name: James [name] MRN: [id number] DOB: [birth year]   Cancelled Treatment:    Reason Eval/Treat Not Completed: Patient at procedure or test/unavailable.  Pt out of room having an endoscopy/colonoscopy.  Will see pt for evaluation when able. 08/22/2015  [name], [place] [phone]  (pager)   Drishti Pepperman, Tessie Fass 08/22/2015, 1:09 PM

## 2015-08-23 ENCOUNTER — Encounter (HOSPITAL_COMMUNITY): Payer: Self-pay | Admitting: Gastroenterology

## 2015-08-23 DIAGNOSIS — E43 Unspecified severe protein-calorie malnutrition: Secondary | ICD-10-CM

## 2015-08-23 DIAGNOSIS — F1010 Alcohol abuse, uncomplicated: Secondary | ICD-10-CM

## 2015-08-23 DIAGNOSIS — K274 Chronic or unspecified peptic ulcer, site unspecified, with hemorrhage: Secondary | ICD-10-CM

## 2015-08-23 DIAGNOSIS — D500 Iron deficiency anemia secondary to blood loss (chronic): Secondary | ICD-10-CM

## 2015-08-23 LAB — BASIC METABOLIC PANEL
Anion gap: 6 (ref 5–15)
CHLORIDE: 104 mmol/L (ref 101–111)
CO2: 23 mmol/L (ref 22–32)
CREATININE: 0.59 mg/dL — AB (ref 0.61–1.24)
Calcium: 7.9 mg/dL — ABNORMAL LOW (ref 8.9–10.3)
Glucose, Bld: 87 mg/dL (ref 65–99)
Potassium: 3.2 mmol/L — ABNORMAL LOW (ref 3.5–5.1)
SODIUM: 133 mmol/L — AB (ref 135–145)

## 2015-08-23 LAB — CBC
HCT: 25.7 % — ABNORMAL LOW (ref 39.0–52.0)
HEMOGLOBIN: 8.1 g/dL — AB (ref 13.0–17.0)
MCH: 22.5 pg — AB (ref 26.0–34.0)
MCHC: 31.5 g/dL (ref 30.0–36.0)
MCV: 71.4 fL — ABNORMAL LOW (ref 78.0–100.0)
PLATELETS: 281 10*3/uL (ref 150–400)
RBC: 3.6 MIL/uL — AB (ref 4.22–5.81)
WBC: 6.5 10*3/uL (ref 4.0–10.5)

## 2015-08-23 LAB — H. PYLORI ANTIBODY, IGG: H PYLORI IGG: 6.2 U/mL — AB (ref 0.0–0.8)

## 2015-08-23 MED ORDER — PANTOPRAZOLE SODIUM 40 MG IV SOLR: 40.0000 mg | Freq: Two times a day (BID) | INTRAVENOUS | Status: DC

## 2015-08-23 MED ORDER — POTASSIUM CHLORIDE CRYS ER 20 MEQ PO TBCR
40.0000 meq | EXTENDED_RELEASE_TABLET | Freq: Once | ORAL | Status: AC
  Administered 2015-08-23: 40 meq via ORAL
  Filled 2015-08-23: qty 2

## 2015-08-23 NOTE — Care Management Note (Addendum)
Case Management Note  Patient Details  Name: James [name] MRN: [id number] Date of Birth: 01-Feb-1956  Subjective/Objective:     Date: 08/23/15 Spoke with patient at the bedside.  Introduced self as Tourist information centre manager and explained role in discharge planning and how to be reached.  Verified patient lives in town, alone with spouse and son. Expressed potential need for cane (single point) DME but he will purchase this from a store rather than go thru insurance..  Verified patient anticipates to go home with family,  at time of discharge and will have full-time supervision by family at this time to best of their knowledge. Patient denied needing help with their medication.  Patient drives or is driven by wife to MD appointments.  Verified patient has PCP Jill Alexanders. Patient is indep.   Plan: CM will continue to follow for discharge planning and [facility] resources.                Action/Plan:   Expected Discharge Date:                  Expected Discharge Plan:  Home/Self Care  In-House Referral:  NA  Discharge planning Services  CM Consult  Post Acute Care Choice:  NA Choice offered to:  NA  DME Arranged:  N/A DME Agency:  NA  HH Arranged:  NA HH Agency:  NA  Status of Service:  Completed, signed off  Medicare Important Message Given:    Date Medicare IM Given:    Medicare IM give by:    Date Additional Medicare IM Given:    Additional Medicare Important Message give by:     If discussed at [place] of Stay Meetings, dates discussed:    Additional Comments:  Zenon Mayo, RN 08/23/2015, 11:30 AM

## 2015-08-23 NOTE — Progress Notes (Signed)
Patient ID: [name], male   DOB: 12-17-1955, 59 y.o.   MRN: [id number] [facility] Gastroenterology Progress Note  Chace Sneary [id number] y.o. [birth year]   Subjective: Feels ok. Hungry. No further bleeding.  Objective: Vital signs in last 24 hours: Filed Vitals:   08/23/15 0617 08/23/15 1413  BP: 97/61 107/63  Pulse: 74 84  Temp: 98.6 F (37 C) 97.9 F (36.6 C)  Resp: 17 20    Physical Exam: Gen: lethargic, thin, no acute distress CV: RRR Chest: CTA B Abd: soft, nontender, nondistended, +BS  Lab Results:  Recent Labs  08/22/15 0612 08/23/15 0633  NA 132* 133*  K 3.8 3.2*  CL 102 104  CO2 23 23  GLUCOSE 82 87  BUN <5* <5*  CREATININE 0.62 0.59*  CALCIUM 8.1* 7.9*    Recent Labs  08/21/15 0527 08/22/15 0612  AST 27 21  ALT 17 15*  ALKPHOS 80 71  BILITOT 2.4* 1.0  PROT 6.4* 5.5*  ALBUMIN 2.6* 2.2*    Recent Labs  08/22/15 1510 08/23/15 0633  WBC 6.7 6.5  HGB 7.8* 8.1*  HCT 25.5* 25.7*  MCV 72.0* 71.4*  PLT 257 281    Recent Labs  08/21/15 0527  LABPROT 14.3  INR 1.09      Assessment/Plan: Large gastric ulcer without any recurrent bleeding. Continue Protonix drip until 08/25/15 and then change to IV PPI Q 12 hours. Will need PPI PO BID for 3 months and then QD. Avoid all NSAIDs. H pylori positive and will need treatment for 14 days for H. Pylori at discharge. Soft food ok today and if tolerates then advance further tomorrow. Will sign off. Call if questions.   [place] C. 08/23/2015, 3:45 PM  Pager [phone]  If no answer or after 5 PM call [phone]

## 2015-08-23 NOTE — Op Note (Signed)
[facility] [place], [id number]   ENDOSCOPY PROCEDURE REPORT  PATIENT: James, [name]  MR#: [id number] BIRTHDATE: [birth year] , 59  yrs. old GENDER: male ENDOSCOPIST: Wilford Corner, MD REFERRED BY:  hospital team PROCEDURE DATE:  08-23-15 PROCEDURE:  EGD w/ biopsy ASA CLASS:     Class II INDICATIONS:  GI bleed. MEDICATIONS: Monitored anesthesia care and Per Anesthesia TOPICAL ANESTHETIC:  DESCRIPTION OF PROCEDURE: After the risks benefits and alternatives of the procedure were thoroughly explained, informed consent was obtained.  The Pentax Gastroscope [id number] endoscope was introduced through the mouth and advanced to the second portion of the duodenum , Without limitations.  The instrument was slowly withdrawn as the mucosa was fully examined. Estimated blood loss is zero unless otherwise noted in this procedure report.    Esophagus normal. GEJ 42 cm from the incisors. Large deep cratered clean-based ulcer on lesser curvature of stomach with surrounding mucosa that is edematous. S/P biopsies of surrounding mucosa. Clear fluid in stomach. Duod bulb and 2nd part of duodenum normal in appearance.         Retroflexed views revealed small hiatal hernia. Friable mucosa in fundus with small amount of bleeding from endoscopic trauma.  Ulcer again noted.     The scope was then withdrawn from the patient and the procedure completed.  COMPLICATIONS: There were no immediate complications.  ENDOSCOPIC IMPRESSION:     Large cratered clean-based gastric ulcer (lesser curvature) - no active bleeding from ulcer Small hiatal hernia  RECOMMENDATIONS:     F/U path; Protonix drip; Avoid NSAIDs    eSigned:  Wilford Corner, MD 08/23/15 12:53 PM    CC:  CPT CODES: ICD CODES:  The ICD and CPT codes recommended by this software are interpretations from the data that the clinical staff has captured with the software.  The verification  of the translation of this report to the ICD and CPT codes and modifiers is the sole responsibility of the health care institution and practicing physician where this report was generated.  [employer]. will not be held responsible for the validity of the ICD and CPT codes included on this report.  AMA assumes no liability for data contained or not contained herein. CPT is a Designer, television/film set of the [employer].  PATIENT NAME:  James, [name] MR#: [id number]

## 2015-08-23 NOTE — Op Note (Signed)
[facility] [place], [id number]   COLONOSCOPY PROCEDURE REPORT     EXAM DATE: 09/12/2015  PATIENT NAME:      Robertt, Whitty           MR #:      [id number]  BIRTHDATE:       [birth year]      VISIT #:     [id number]  ATTENDING:     Wilford Corner, MD     STATUS:     inpatient REFERRING MD: ASA CLASS:        Class II  INDICATIONS:  The patient is a 59 yr old male here for a colonoscopy due to GI bleed. PROCEDURE PERFORMED:     Colonoscopy, diagnostic MEDICATIONS:     Monitored anesthesia care and Per Anesthesia  ESTIMATED BLOOD LOSS:     None  CONSENT: The patient understands the risks and benefits of the procedure and understands that these risks include, but are not limited to: sedation, allergic reaction, infection, perforation and/or bleeding. Alternative means of evaluation and treatment include, among others: physical exam, x-rays, and/or surgical intervention. The patient elects to proceed with this endoscopic procedure.  DESCRIPTION OF PROCEDURE: During intra-op preparation period all mechanical & medical equipment was checked for proper function. Hand hygiene and appropriate measures for infection prevention was taken. After the risks, benefits and alternatives of the procedure were thoroughly explained, Informed consent was verified, confirmed and timeout was successfully executed by the treatment team. A digital exam revealed no abnormalities of the rectum.      The Pentax Ped Colon [id number] endoscope was introduced through the anus and advanced to the cecum, which was identified by both the appendix and ileocecal valve. The prep was suboptimal. The instrument was then slowly withdrawn as the colon was fully examined. Estimated blood loss is zero unless otherwise noted in this procedure report. Limited view of parts of the colon due to suboptimal prep. Loop reduction and abdominal pressure necessary to reach the  cecum due to excessive looping. Frequent irrigation and aspiration needed due to suboptimal prep. No colon polyps seen.      Retroflexed views revealed large internal hemorrhoids.  The scope was then completely withdrawn from the patient and the procedure terminated.     ADVERSE EVENTS:      There were no immediate complications.   IMPRESSIONS:     Large internal hemorrhoids otherwise normal colonoscopy (limited view due to suboptimal prep)   RECOMMENDATIONS:     Repeat colonoscopy in 5 years   Wilford Corner, MD eSigned:  Wilford Corner, MD Sep 12, 2015 12:57 PM   cc:  CPT CODES: ICD CODES:  The ICD and CPT codes recommended by this software are interpretations from the data that the clinical staff has captured with the software.  The verification of the translation of this report to the ICD and CPT codes and modifiers is the sole responsibility of the health care institution and practicing physician where this report was generated.  [place]. will not be held responsible for the validity of the ICD and CPT codes included on this report.  AMA assumes no liability for data contained or not contained herein. CPT is a Designer, television/film set of the [employer].

## 2015-08-23 NOTE — Anesthesia Postprocedure Evaluation (Signed)
Anesthesia Post Note  Patient: James [name]  Procedure(s) Performed: Procedure(s) (LRB): COLONOSCOPY WITH PROPOFOL (N/A) ESOPHAGOGASTRODUODENOSCOPY (EGD) WITH PROPOFOL (N/A)  Patient location during evaluation: PACU Anesthesia Type: MAC Level of consciousness: awake and alert Pain management: pain level controlled Vital Signs Assessment: post-procedure vital signs reviewed and stable Respiratory status: spontaneous breathing, nonlabored ventilation, respiratory function stable and patient connected to nasal cannula oxygen Cardiovascular status: stable and blood pressure returned to baseline Anesthetic complications: no    Last Vitals:  Filed Vitals:   08/23/15 0028 08/23/15 0617  BP: 109/48 97/61  Pulse: 71 74  Temp: 37.1 C 37 C  Resp: 18 17    Last Pain:  Filed Vitals:   08/23/15 0813  PainSc: 0-No pain                 Alen Matheson S

## 2015-08-23 NOTE — Progress Notes (Signed)
[facility] PROGRESS NOTE  James [name] [id number] DOB: 1956/05/01 DOA: 08/20/2015  PCP: Wyatt Haste, MD  Brief HPI: 59 year old African-American male with past medical history of polysubstance abuse, including alcohol abuse, iron deficiency anemia who presented with one-week history of chest pain, shortness of breath, abdominal pain and black stool. He was noted to have a hemoglobin of 3.9, with heme positive stool. He was admitted for further management. Patient underwent EGD and colposcopy.  Past medical history:  Past Medical History  Diagnosis Date  . GERD (gastroesophageal reflux disease)   . Dysphagia   . Alcohol abuse   . Anemia due to GI blood loss 09/2011    microcytic.  transfused for Hgb 3.5, MCV in 50s.   . Benign neoplasm of colon   . Cachexia ([place])   . Edema   . ILD (interstitial lung disease) ([place])   . Iron deficiency anemia, unspecified   . Pulmonary nodule   . Reflux esophagitis   . Tobacco abuse   . Weight loss, abnormal   . Smoker   . ED (erectile dysfunction)   . Aortic insufficiency   . Black stools 08/21/2015  . Chest pain 08/21/2015    Consultants: Gastroenterology  Procedures:  EGD ENDOSCOPIC IMPRESSION:Large cratered clean-based gastric ulcer (lesser curvature) - no active bleeding from ulcer. Small hiatal hernia  Colonoscopy IMPRESSIONS:Large internal hemorrhoids otherwise normal colonoscopy (limited view due to suboptimal prep).  Antibiotics: None  Subjective: Patient feels well. Requesting advancement in diet. Denies any abdominal pain, nausea, vomiting. No blood in stool.  Objective: Vital Signs  Filed Vitals:   08/22/15 2000 08/22/15 2054 08/23/15 0028 08/23/15 0617  BP: 118/68 108/56 109/48 97/61  Pulse: 86 73 71 74  Temp:  97.9 F (36.6 C) 98.8 F (37.1 C) 98.6 F (37 C)  TempSrc:  Oral Oral Oral  Resp: 25 20 18 17   Height:  5\' 9"  (1.753 m)    Weight:  50.304 kg (110 lb 14.4 oz)    SpO2: 100%  100% 100% 100%    Intake/Output Summary (Last 24 hours) at 08/23/15 1404 Last data filed at 08/23/15 1121  Gross per 24 hour  Intake   2330 ml  Output   1300 ml  Net   1030 ml   Filed Weights   08/20/15 2159 08/22/15 0600 08/22/15 2054  Weight: 42.774 kg (94 lb 4.8 oz) 46.4 kg (102 lb 4.7 oz) 50.304 kg (110 lb 14.4 oz)    General appearance: alert, cooperative, appears stated age and no distress Resp: clear to auscultation bilaterally Cardio: regular rate and rhythm, S1, S2 normal, no murmur, click, rub or gallop GI: soft, non-tender; bowel sounds normal; no masses,  no organomegaly Extremities: extremities normal, atraumatic, no cyanosis or edema Neurologic: Alert and oriented X 3,. No focal deficits.  Lab Results:  Basic Metabolic Panel:  Recent Labs Lab 08/20/15 2206 08/22/15 0612 08/23/15 0633  NA 132* 132* 133*  K 3.7 3.8 3.2*  CL 97* 102 104  CO2 21* 23 23  GLUCOSE 91 82 87  BUN <5* <5* <5*  CREATININE 0.61 0.62 0.59*  CALCIUM 8.5* 8.1* 7.9*   Liver Function Tests:  Recent Labs Lab 08/21/15 0527 08/22/15 0612  AST 27 21  ALT 17 15*  ALKPHOS 80 71  BILITOT 2.4* 1.0  PROT 6.4* 5.5*  ALBUMIN 2.6* 2.2*    Recent Labs Lab 08/20/15 2206  LIPASE 23   No results for input(s): AMMONIA in the last 168 hours. CBC:  Recent Labs Lab 08/21/15 1817 08/21/15 2349 08/22/15 0612 08/22/15 1510 08/23/15 0633  WBC 9.0 8.4 7.8 6.7 6.5  HGB 8.3* 7.6* 7.8* 7.8* 8.1*  HCT 27.6* 25.1* 25.0* 25.5* 25.7*  MCV 71.0* 70.9* 71.2* 72.0* 71.4*  PLT 248 233 252 257 281   Cardiac Enzymes:  Recent Labs Lab 08/21/15 0527 08/21/15 1340 08/21/15 1817  TROPONINI <0.03 <0.03 <0.03   BNP (last 3 results)  Recent Labs  01/01/15 1957  BNP 20.4    CBG:  Recent Labs Lab 08/21/15 0821 08/21/15 0948 08/22/15 0912  GLUCAP 92 103* 76    Recent Results (from the past 240 hour(s))  MRSA PCR Screening     Status: None   Collection Time: 08/21/15  9:20 AM    Result Value Ref Range Status   MRSA by PCR NEGATIVE NEGATIVE Final    Comment:        The GeneXpert MRSA Assay (FDA approved for NASAL specimens only), is one component of a comprehensive MRSA colonization surveillance program. It is not intended to diagnose MRSA infection nor to guide or monitor treatment for MRSA infections.       Studies/Results: No results found.  Medications:  Scheduled: . antiseptic oral rinse  7 mL Mouth Rinse q12n4p  . chlorhexidine  15 mL Mouth Rinse BID  . feeding supplement  1 Container Oral TID BM  . ferrous sulfate  325 mg Oral BID WC  . folic acid  1 mg Oral Daily  . LORazepam  0-4 mg Intravenous Q12H  . multivitamin with minerals  1 tablet Oral Daily  . nicotine  21 mg Transdermal Daily  . [START ON 08/24/2015] pantoprazole (PROTONIX) IV  40 mg Intravenous Q12H  . pneumococcal 23 valent vaccine  0.5 mL Intramuscular Tomorrow-1000  . sodium chloride  3 mL Intravenous Q12H  . thiamine  100 mg Oral Daily   Or  . thiamine  100 mg Intravenous Daily   Continuous: . sodium chloride 75 mL/hr at 08/23/15 1307  . pantoprozole (PROTONIX) infusion 8 mg/hr (08/23/15 1307)   [id number], guaiFENesin, LORazepam **OR** LORazepam, morphine injection, ondansetron  Assessment/Plan:  Principal Problem:   GIB (gastrointestinal bleeding) Active Problems:   Anemia due to GI blood loss   Tobacco abuse   Alcohol abuse   Iron deficiency anemia, unspecified   ILD (interstitial lung disease) ([facility])   ETOH abuse   Severe protein-calorie malnutrition ([place])   Marijuana abuse    GI bleed EGD revealed large gastric ulcer. Patient placed on Protonix infusion. This will be continued at least through tomorrow. Colonoscopy showed internal hemorrhoids but otherwise unremarkable, although there was a poor prep. H pylori serology was positive. Discussed with Dr. Michail Sermon. We will initiate antibiotic treatment before discharge.  Acute blood loss  anemia Secondary to GI bleed. Patient received 4 units of packed red blood cells. Hemoglobin has responded appropriately. Continue ferrous sulfate.  History of substance abuse including alcohol and tobacco Urine drug screen was also positive for opiates and marijuana. Social worker evaluate. Patient has been counseled.  Fatty liver Noted on CT scan. Patient has been advised to abstain from alcohol.  Chest pain Most likely due to alcoholic gastritis. Currently resolved. Troponins were negative. EKG without any ischemic changes.  Interstitial lung disease Stable  Severe protein calorie malnutrition Continue nutritional supplements.   DVT Prophylaxis: SCDs    Code Status: Full code  Family Communication: Discussed with the patient  Disposition Plan: Advance diet today. He will likely remain on PPI  infusion till Saturday.     LOS: 2 days   [facility] Pager [phone] 08/23/2015, 2:04 PM  If 7PM-7AM, please contact night-coverage at [email or web address], password [facility]

## 2015-08-24 DIAGNOSIS — K254 Chronic or unspecified gastric ulcer with hemorrhage: Secondary | ICD-10-CM

## 2015-08-24 DIAGNOSIS — E876 Hypokalemia: Secondary | ICD-10-CM

## 2015-08-24 LAB — CBC
HEMATOCRIT: 24.3 % — AB (ref 39.0–52.0)
HEMATOCRIT: 26.6 % — AB (ref 39.0–52.0)
HEMOGLOBIN: 7.4 g/dL — AB (ref 13.0–17.0)
HEMOGLOBIN: 8 g/dL — AB (ref 13.0–17.0)
MCH: 21.8 pg — AB (ref 26.0–34.0)
MCH: 21.9 pg — ABNORMAL LOW (ref 26.0–34.0)
MCHC: 30.5 g/dL (ref 30.0–36.0)
MCHC: 30.1 g/dL (ref 30.0–36.0)
MCV: 71.7 fL — AB (ref 78.0–100.0)
MCV: 72.7 fL — AB (ref 78.0–100.0)
Platelets: 301 10*3/uL (ref 150–400)
Platelets: 301 10*3/uL (ref 150–400)
RBC: 3.39 MIL/uL — AB (ref 4.22–5.81)
RBC: 3.66 MIL/uL — ABNORMAL LOW (ref 4.22–5.81)
WBC: 6.1 10*3/uL (ref 4.0–10.5)
WBC: 6.2 10*3/uL (ref 4.0–10.5)

## 2015-08-24 LAB — BASIC METABOLIC PANEL
Anion gap: 6 (ref 5–15)
CHLORIDE: 106 mmol/L (ref 101–111)
CO2: 22 mmol/L (ref 22–32)
CREATININE: 0.55 mg/dL — AB (ref 0.61–1.24)
Calcium: 7.8 mg/dL — ABNORMAL LOW (ref 8.9–10.3)
GFR calc Af Amer: >60 mL/min (ref >60)
GFR calc non Af Amer: >60 mL/min (ref >60)
GLUCOSE: 90 mg/dL (ref 65–99)
POTASSIUM: 3.2 mmol/L — AB (ref 3.5–5.1)
SODIUM: 134 mmol/L — AB (ref 135–145)

## 2015-08-24 LAB — TYPE AND SCREEN
ABO/RH(D): O POS
ANTIBODY SCREEN: NEGATIVE
UNIT DIVISION: 0
UNIT DIVISION: 0
Unit division: 0
Unit division: 0

## 2015-08-24 LAB — MAGNESIUM: MAGNESIUM: 1.4 mg/dL — AB (ref 1.7–2.4)

## 2015-08-24 MED ORDER — ENSURE ENLIVE PO LIQD
237.0000 mL | Freq: Three times a day (TID) | ORAL | Status: DC
  Administered 2015-08-24 – 2015-08-26 (×6): 237 mL via ORAL

## 2015-08-24 MED ORDER — POTASSIUM CHLORIDE CRYS ER 20 MEQ PO TBCR
40.0000 meq | EXTENDED_RELEASE_TABLET | Freq: Once | ORAL | Status: AC
  Administered 2015-08-24: 40 meq via ORAL
  Filled 2015-08-24: qty 2

## 2015-08-24 MED ORDER — MAGNESIUM SULFATE 2 GM/50ML IV SOLN
2.0000 g | Freq: Once | INTRAVENOUS | Status: AC
  Administered 2015-08-24: 2 g via INTRAVENOUS
  Filled 2015-08-24 (×2): qty 50

## 2015-08-24 NOTE — Progress Notes (Signed)
Nutrition Follow-up  DOCUMENTATION CODES:   Severe malnutrition in context of chronic illness, Underweight  INTERVENTION:   -D/c Boost Breeze due to poor acceptance -Ensure Enlive po TID, each supplement provides 350 kcal and 20 grams of protein  NUTRITION DIAGNOSIS:   Malnutrition related to chronic illness as evidenced by severe depletion of body fat, severe depletion of muscle mass.  Ongoing  GOAL:   Patient will meet greater than or equal to 90% of their needs  Progressing  MONITOR:   PO intake, Supplement acceptance, Weight trends, Labs, I & O's  REASON FOR ASSESSMENT:   Consult Assessment of nutrition requirement/status  ASSESSMENT:   59 y.o. Male with PMH of iron deficiency anemia, cachexia, ETOH abuse, tobacco abuse, marijuana abuse, aortic insufficiency, ILD, GERD, who presents with chest pain, shortness of breath, abdominal pain, black stool.  Pt s/p EGD and colonoscopy on 08/21/15, which revealed hemorrhoids per MD notes.   Pt sleeping soundly at time of visit. He has been advanced to a soft diet. Good appetite; meal completion 100%. Pt is refusing Boost Breeze supplements; noted unopened container of Boost Breeze on bedside table. RD will will add Ensure Enlive supplement due to diet advancement.   Labs reviewed: Na: 134 (on IV supplementation), K: 3.2.  Diet Order:  Diet 2 gram sodium Room service appropriate?: Yes; Fluid consistency:: Thin  Skin:  Reviewed, no issues  Last BM:  08/23/15  Height:   Ht Readings from Last 1 Encounters:  08/22/15 5\' 9"  (1.753 m)    Weight:   Wt Readings from Last 1 Encounters:  08/22/15 110 lb 14.4 oz (50.304 kg)    Ideal Body Weight:  73 kg  BMI:  Body mass index is 16.37 kg/(m^2).  Estimated Nutritional Needs:   Kcal:  1500-1700  Protein:  70-80 gm  Fluid:  1.5-1.7 L  EDUCATION NEEDS:   No education needs identified at this time  Shey Bartmess A. Jimmye Norman, RD, LDN, CDE Pager: [phone] After hours  Pager: [phone]

## 2015-08-24 NOTE — Progress Notes (Signed)
[facility] PROGRESS NOTE  James [name] [id number] DOB: 1956-07-04 DOA: 08/20/2015  PCP: Wyatt Haste, MD  Brief HPI: 59 year old African-American male with past medical history of polysubstance abuse, including alcohol abuse, iron deficiency anemia who presented with one-week history of chest pain, shortness of breath, abdominal pain and black stool. He was noted to have a hemoglobin of 3.9, with heme positive stool. He was admitted for further management. Patient underwent EGD and colonoscopy.  Past medical history:  Past Medical History  Diagnosis Date  . GERD (gastroesophageal reflux disease)   . Dysphagia   . Alcohol abuse   . Anemia due to GI blood loss 09/2011    microcytic.  transfused for Hgb 3.5, MCV in 50s.   . Benign neoplasm of colon   . Cachexia ([place])   . Edema   . ILD (interstitial lung disease) ([place])   . Iron deficiency anemia, unspecified   . Pulmonary nodule   . Reflux esophagitis   . Tobacco abuse   . Weight loss, abnormal   . Smoker   . ED (erectile dysfunction)   . Aortic insufficiency   . Black stools 08/21/2015  . Chest pain 08/21/2015    Consultants: Gastroenterology  Procedures:  EGD ENDOSCOPIC IMPRESSION:Large cratered clean-based gastric ulcer (lesser curvature) - no active bleeding from ulcer. Small hiatal hernia  Colonoscopy IMPRESSIONS:Large internal hemorrhoids otherwise normal colonoscopy (limited view due to suboptimal prep).  Antibiotics: None  Subjective: Patient continues to feel well. Denies any abdominal pain, nausea or vomiting. Hasn't had any further bowel movements.   Objective: Vital Signs  Filed Vitals:   08/23/15 0617 08/23/15 1413 08/23/15 2149 08/24/15 0450  BP: 97/61 107/63 124/66 119/74  Pulse: 74 84 79 84  Temp: 98.6 F (37 C) 97.9 F (36.6 C) 98.3 F (36.8 C) 98.4 F (36.9 C)  TempSrc: Oral Oral Oral Oral  Resp: 17 20 16 18   Height:      Weight:      SpO2: 100% 100% 100% 100%     Intake/Output Summary (Last 24 hours) at 08/24/15 1258 Last data filed at 08/24/15 1100  Gross per 24 hour  Intake   2370 ml  Output   2050 ml  Net    320 ml   Filed Weights   08/20/15 2159 08/22/15 0600 08/22/15 2054  Weight: 42.774 kg (94 lb 4.8 oz) 46.4 kg (102 lb 4.7 oz) 50.304 kg (110 lb 14.4 oz)    General appearance: alert, cooperative, appears stated age and no distress Resp: clear to auscultation bilaterally Cardio: regular rate and rhythm, S1, S2 normal, no murmur, click, rub or gallop GI: soft, non-tender; bowel sounds normal; no masses,  no organomegaly Neurologic: Alert and oriented X 3,. No focal deficits.  Lab Results:  Basic Metabolic Panel:  Recent Labs Lab 08/20/15 2206 08/22/15 0612 08/23/15 0633 08/24/15 0525 08/24/15 1045  NA 132* 132* 133* 134*  --   K 3.7 3.8 3.2* 3.2*  --   CL 97* 102 104 106  --   CO2 21* 23 23 22   --   GLUCOSE 91 82 87 90  --   BUN <5* <5* <5* <5*  --   CREATININE 0.61 0.62 0.59* 0.55*  --   CALCIUM 8.5* 8.1* 7.9* 7.8*  --   MG  --   --   --   --  1.4*   Liver Function Tests:  Recent Labs Lab 08/21/15 0527 08/22/15 0612  AST 27 21  ALT 17 15*  ALKPHOS 80 71  BILITOT 2.4* 1.0  PROT 6.4* 5.5*  ALBUMIN 2.6* 2.2*    Recent Labs Lab 08/20/15 2206  LIPASE 23   No results for input(s): AMMONIA in the last 168 hours. CBC:  Recent Labs Lab 08/21/15 2349 08/22/15 0612 08/22/15 1510 08/23/15 0633 08/24/15 0525  WBC 8.4 7.8 6.7 6.5 6.1  HGB 7.6* 7.8* 7.8* 8.1* 7.4*  HCT 25.1* 25.0* 25.5* 25.7* 24.3*  MCV 70.9* 71.2* 72.0* 71.4* 71.7*  PLT 233 252 257 281 301   Cardiac Enzymes:  Recent Labs Lab 08/21/15 0527 08/21/15 1340 08/21/15 1817  TROPONINI <0.03 <0.03 <0.03   BNP (last 3 results)  Recent Labs  01/01/15 1957  BNP 20.4    CBG:  Recent Labs Lab 08/21/15 0821 08/21/15 0948 08/22/15 0912  GLUCAP 92 103* 76    Recent Results (from the past 240 hour(s))  MRSA PCR Screening      Status: None   Collection Time: 08/21/15  9:20 AM  Result Value Ref Range Status   MRSA by PCR NEGATIVE NEGATIVE Final    Comment:        The GeneXpert MRSA Assay (FDA approved for NASAL specimens only), is one component of a comprehensive MRSA colonization surveillance program. It is not intended to diagnose MRSA infection nor to guide or monitor treatment for MRSA infections.       Studies/Results: No results found.  Medications:  Scheduled: . antiseptic oral rinse  7 mL Mouth Rinse q12n4p  . chlorhexidine  15 mL Mouth Rinse BID  . feeding supplement  1 Container Oral TID BM  . ferrous sulfate  325 mg Oral BID WC  . folic acid  1 mg Oral Daily  . LORazepam  0-4 mg Intravenous Q12H  . multivitamin with minerals  1 tablet Oral Daily  . nicotine  21 mg Transdermal Daily  . [START ON 08/26/2015] pantoprazole (PROTONIX) IV  40 mg Intravenous Q12H  . pneumococcal 23 valent vaccine  0.5 mL Intramuscular Tomorrow-1000  . sodium chloride  3 mL Intravenous Q12H  . thiamine  100 mg Oral Daily   Continuous: . sodium chloride 10 mL/hr at 08/24/15 1043  . pantoprozole (PROTONIX) infusion 8 mg/hr (08/24/15 1045)   [id number], guaiFENesin, morphine injection, ondansetron  Assessment/Plan:  Principal Problem:   GIB (gastrointestinal bleeding) Active Problems:   Anemia due to GI blood loss   Tobacco abuse   Alcohol abuse   Iron deficiency anemia, unspecified   ILD (interstitial lung disease) (HCC)   ETOH abuse   Severe protein-calorie malnutrition (HCC)   Marijuana abuse    GI bleed EGD revealed large gastric ulcer. Patient placed on Protonix infusion. This will be continued till tomorrow morning per gastroenterology. Colonoscopy showed internal hemorrhoids but otherwise unremarkable, although there was a poor prep. H pylori serology was positive. Discussed with Dr. Michail Sermon. Will need to initiate antibiotic treatment at the time of discharge.  Acute blood loss  anemia Secondary to GI bleed. Patient received 4 units of packed red blood cells. Hemoglobin has responded appropriately. Continue ferrous sulfate. Hemoglobin fluctuating some but mostly stable.  History of substance abuse including alcohol and tobacco Urine drug screen was also positive for opiates and marijuana. Patient has been counseled.  Hypokalemia and hypomagnesemia These will be repleted.  Fatty liver Noted on CT scan. Patient has been advised to abstain from alcohol.  Chest pain Most likely due to alcoholic gastritis. Currently resolved. Troponins were negative. EKG without any ischemic changes.  Interstitial lung  disease Stable  Severe protein calorie malnutrition Continue nutritional supplements.   DVT Prophylaxis: SCDs    Code Status: Full code  Family Communication: Discussed with the patient  Disposition Plan: He will remain on PPI infusion till Saturday morning at which point he will need IV PPI till Sunday morning at which point he could be discharged.     LOS: 3 days   [facility] Pager [phone] 08/24/2015, 12:58 PM  If 7PM-7AM, please contact night-coverage at [email or web address], password [facility]

## 2015-08-24 NOTE — Evaluation (Signed)
Physical Therapy Evaluation Patient Details Name: James [name] MRN: [id number] DOB: 05/25/1956 Today's Date: 08/23/2015   History of Present Illness   This 59 y.o. Male admitted with one week h/o chest pain, SOB, abdominal pain, and black stool.   Hbg in ED 3.9.  Chest x-ray showed emphysematous change without infiltration.  CT abdomen/pelvis negative for acute abnormalities.  Work up underway.  PMH includes:  Polysubstance abuse, Fe deficiency anemia, pulmonary nodule, aortic insufficiency, ILD, CERD.   Clinical Impression  Patient evaluated by PT yesterday and re-ordered today.  Functionally patient continues to be at independent level and no further skilled PT needs at this time.   Encouraged hallway ambulation with nursing assist for maintaining strength and balance.     Follow Up Recommendations No PT follow up    Equipment Recommendations  None recommended by PT    Recommendations for Other Services       Precautions / Restrictions Precautions Precautions: None      Mobility  Bed Mobility Overal bed mobility: Independent                Transfers Overall transfer level: Independent Equipment used: None                Ambulation/Gait Ambulation/Gait assistance: Independent Ambulation Distance (Feet): 450 Feet Assistive device: None Gait Pattern/deviations: Step-through pattern;WFL(Within Functional Limits)   Gait velocity interpretation: at or above normal speed for age/gender    Stairs Stairs: Yes Stairs assistance: Modified independent (Device/Increase time) Stair Management: One rail Right Number of Stairs: 3    Wheelchair Mobility    Modified Rankin (Stroke Patients Only)       Balance Overall balance assessment: Independent                               Standardized Balance Assessment Standardized Balance Assessment : Dynamic Gait Index   Dynamic Gait Index Level Surface: Normal Change in Gait Speed: Normal Gait  with Horizontal Head Turns: Normal Gait with Vertical Head Turns: Normal Gait and Pivot Turn: Normal Step Over Obstacle: Normal Step Around Obstacles: Normal Steps: Mild Impairment Total Score: 23       Pertinent Vitals/Pain Pain Assessment: No/denies pain    Home Living Family/patient expects to be discharged to:: Private residence Living Arrangements: Spouse/significant other;Children Available Help at Discharge: Family;Available PRN/intermittently Type of Home: Apartment Home Access: Level entry     Home Layout: One level Home Equipment: None      Prior Function Level of Independence: Independent               Hand Dominance        Extremity/Trunk Assessment   Upper Extremity Assessment: Defer to OT evaluation           Lower Extremity Assessment: Overall WFL for tasks assessed         Communication   Communication: No difficulties  Cognition Arousal/Alertness: Awake/alert Behavior During Therapy: WFL for tasks assessed/performed Overall Cognitive Status: Within Functional Limits for tasks assessed                      General Comments      Exercises        Assessment/Plan    PT Assessment Patent does not need any further PT services  PT Diagnosis Generalized weakness   PT Problem List    PT Treatment Interventions     PT Goals (Current  goals can be found in the Care Plan section) Acute Rehab PT Goals PT Goal Formulation: All assessment and education complete, DC therapy    Frequency     Barriers to discharge        Co-evaluation               End of Session Equipment Utilized During Treatment: Gait belt Activity Tolerance: Patient tolerated treatment well Patient left: in bed;with call bell/phone within reach           Time:1420  - 1437     Charges:  PT Evaluation                        $Initial PT Evaluation Tier I: 1 Procedure       PT G CodesReginia Naas 2015-09-06, 3:17PM  Magda Kiel, PT 2015/09/06

## 2015-08-25 LAB — BASIC METABOLIC PANEL
Anion gap: 6 (ref 5–15)
CALCIUM: 8.2 mg/dL — AB (ref 8.9–10.3)
CO2: 24 mmol/L (ref 22–32)
CREATININE: 0.57 mg/dL — AB (ref 0.61–1.24)
Chloride: 105 mmol/L (ref 101–111)
GFR calc non Af Amer: >60 mL/min (ref >60)
GLUCOSE: 94 mg/dL (ref 65–99)
Potassium: 3.7 mmol/L (ref 3.5–5.1)
Sodium: 135 mmol/L (ref 135–145)

## 2015-08-25 LAB — CBC
HEMATOCRIT: 25.5 % — AB (ref 39.0–52.0)
Hemoglobin: 7.7 g/dL — ABNORMAL LOW (ref 13.0–17.0)
MCH: 22.1 pg — ABNORMAL LOW (ref 26.0–34.0)
MCHC: 30.2 g/dL (ref 30.0–36.0)
MCV: 73.1 fL — ABNORMAL LOW (ref 78.0–100.0)
Platelets: 274 10*3/uL (ref 150–400)
RBC: 3.49 MIL/uL — ABNORMAL LOW (ref 4.22–5.81)
WBC: 7.3 10*3/uL (ref 4.0–10.5)

## 2015-08-25 MED ORDER — MAGNESIUM OXIDE 400 (241.3 MG) MG PO TABS
400.0000 mg | ORAL_TABLET | Freq: Two times a day (BID) | ORAL | Status: DC
  Administered 2015-08-25 – 2015-08-26 (×3): 400 mg via ORAL
  Filled 2015-08-25 (×3): qty 1

## 2015-08-25 MED ORDER — INFLUENZA VAC SPLIT QUAD 0.5 ML IM SUSY
0.5000 mL | PREFILLED_SYRINGE | INTRAMUSCULAR | Status: DC
  Filled 2015-08-25: qty 0.5

## 2015-08-25 MED ORDER — PANTOPRAZOLE SODIUM 40 MG IV SOLR
40.0000 mg | Freq: Two times a day (BID) | INTRAVENOUS | Status: DC
  Administered 2015-08-25 – 2015-08-26 (×3): 40 mg via INTRAVENOUS
  Filled 2015-08-25 (×3): qty 40

## 2015-08-25 NOTE — Progress Notes (Signed)
[facility] PROGRESS NOTE  James [name] [id number] DOB: 1955/10/22 DOA: 08/20/2015  PCP: Wyatt Haste, MD  Brief HPI: 59 year old African-American male with past medical history of polysubstance abuse, including alcohol abuse, iron deficiency anemia who presented with one-week history of chest pain, shortness of breath, abdominal pain and black stool. He was noted to have a hemoglobin of 3.9, with heme positive stool. He was admitted for further management. Patient underwent EGD and colonoscopy.  Past medical history:  Past Medical History  Diagnosis Date  . GERD (gastroesophageal reflux disease)   . Dysphagia   . Alcohol abuse   . Anemia due to GI blood loss 09/2011    microcytic.  transfused for Hgb 3.5, MCV in 50s.   . Benign neoplasm of colon   . Cachexia ([place])   . Edema   . ILD (interstitial lung disease) ([place])   . Iron deficiency anemia, unspecified   . Pulmonary nodule   . Reflux esophagitis   . Tobacco abuse   . Weight loss, abnormal   . Smoker   . ED (erectile dysfunction)   . Aortic insufficiency   . Black stools 08/21/2015  . Chest pain 08/21/2015    Consultants: Gastroenterology  Procedures:  EGD ENDOSCOPIC IMPRESSION:Large cratered clean-based gastric ulcer (lesser curvature) - no active bleeding from ulcer. Small hiatal hernia  Colonoscopy IMPRESSIONS:Large internal hemorrhoids otherwise normal colonoscopy (limited view due to suboptimal prep).  Antibiotics: None  Subjective: Patient denies any complaints. Denies nausea, vomiting. Had a green-colored bowel movement yesterday. No blood.   Objective: Vital Signs  Filed Vitals:   08/24/15 0450 08/24/15 1433 08/24/15 2159 08/25/15 0457  BP: 119/74 122/53 130/63 117/63  Pulse: 84 75 75 81  Temp: 98.4 F (36.9 C) 98.4 F (36.9 C)  98.3 F (36.8 C)  TempSrc: Oral Oral  Oral  Resp: 18 18 20 20   Height:      Weight:      SpO2: 100% 100% 100% 100%    Intake/Output Summary  (Last 24 hours) at 08/25/15 1014 Last data filed at 08/25/15 0917  Gross per 24 hour  Intake  560.5 ml  Output   2595 ml  Net -2034.5 ml   Filed Weights   08/20/15 2159 08/22/15 0600 08/22/15 2054  Weight: 42.774 kg (94 lb 4.8 oz) 46.4 kg (102 lb 4.7 oz) 50.304 kg (110 lb 14.4 oz)    General appearance: alert, cooperative, Resp: clear to auscultation bilaterally Cardio: regular rate and rhythm, S1, S2 normal, no murmur, click, rub or gallop GI: soft, non-tender; bowel sounds normal; no masses,  no organomegaly Neurologic: Alert and oriented X 3,. No focal deficits.  Lab Results:  Basic Metabolic Panel:  Recent Labs Lab 08/20/15 2206 08/22/15 0612 08/23/15 0633 08/24/15 0525 08/24/15 1045 08/25/15 0530  NA 132* 132* 133* 134*  --  135  K 3.7 3.8 3.2* 3.2*  --  3.7  CL 97* 102 104 106  --  105  CO2 21* 23 23 22   --  24  GLUCOSE 91 82 87 90  --  94  BUN <5* <5* <5* <5*  --  <5*  CREATININE 0.61 0.62 0.59* 0.55*  --  0.57*  CALCIUM 8.5* 8.1* 7.9* 7.8*  --  8.2*  MG  --   --   --   --  1.4*  --    Liver Function Tests:  Recent Labs Lab 08/21/15 0527 08/22/15 0612  AST 27 21  ALT 17 15*  ALKPHOS 80  71  BILITOT 2.4* 1.0  PROT 6.4* 5.5*  ALBUMIN 2.6* 2.2*    Recent Labs Lab 08/20/15 2206  LIPASE 23   No results for input(s): AMMONIA in the last 168 hours. CBC:  Recent Labs Lab 08/22/15 1510 08/23/15 0633 08/24/15 0525 08/24/15 1558 08/25/15 0530  WBC 6.7 6.5 6.1 6.2 7.3  HGB 7.8* 8.1* 7.4* 8.0* 7.7*  HCT 25.5* 25.7* 24.3* 26.6* 25.5*  MCV 72.0* 71.4* 71.7* 72.7* 73.1*  PLT 257 281 301 301 274   Cardiac Enzymes:  Recent Labs Lab 08/21/15 0527 08/21/15 1340 08/21/15 1817  TROPONINI <0.03 <0.03 <0.03   BNP (last 3 results)  Recent Labs  01/01/15 1957  BNP 20.4    CBG:  Recent Labs Lab 08/21/15 0821 08/21/15 0948 08/22/15 0912  GLUCAP 92 103* 76    Recent Results (from the past 240 hour(s))  MRSA PCR Screening     Status: None    Collection Time: 08/21/15  9:20 AM  Result Value Ref Range Status   MRSA by PCR NEGATIVE NEGATIVE Final    Comment:        The GeneXpert MRSA Assay (FDA approved for NASAL specimens only), is one component of a comprehensive MRSA colonization surveillance program. It is not intended to diagnose MRSA infection nor to guide or monitor treatment for MRSA infections.       Studies/Results: No results found.  Medications:  Scheduled: . antiseptic oral rinse  7 mL Mouth Rinse q12n4p  . chlorhexidine  15 mL Mouth Rinse BID  . feeding supplement (ENSURE ENLIVE)  237 mL Oral TID BM  . ferrous sulfate  325 mg Oral BID WC  . folic acid  1 mg Oral Daily  . magnesium oxide  400 mg Oral BID  . multivitamin with minerals  1 tablet Oral Daily  . nicotine  21 mg Transdermal Daily  . pantoprazole (PROTONIX) IV  40 mg Intravenous Q12H  . pneumococcal 23 valent vaccine  0.5 mL Intramuscular Tomorrow-1000  . sodium chloride  3 mL Intravenous Q12H  . thiamine  100 mg Oral Daily   Continuous: . sodium chloride 10 mL/hr at 08/24/15 1043   [id number], guaiFENesin, morphine injection, ondansetron  Assessment/Plan:  Principal Problem:   GIB (gastrointestinal bleeding) Active Problems:   Anemia due to GI blood loss   Tobacco abuse   Alcohol abuse   Iron deficiency anemia, unspecified   ILD (interstitial lung disease) (HCC)   ETOH abuse   Severe protein-calorie malnutrition (HCC)   Marijuana abuse    GI bleed EGD revealed large gastric ulcer. Patient placed on Protonix infusion. This will be changed over to every 12 intravenous Protonix. And then per GI recommendations, he will be changed over to oral PPI tomorrow and can be discharged at that time. No further recurrence of bleeding. Colonoscopy showed internal hemorrhoids but otherwise unremarkable, although there was a poor prep. H pylori serology was positive. Discussed with Dr. Michail Sermon. Will need to initiate antibiotic  treatment at the time of discharge.  Acute blood loss anemia Secondary to GI bleed. Patient received 4 units of packed red blood cells. Hemoglobin has responded appropriately. Continue ferrous sulfate. Hemoglobin fluctuating some but mostly stable.  History of substance abuse including alcohol and tobacco Urine drug screen was also positive for opiates and marijuana. Patient has been counseled.  Hypokalemia and hypomagnesemia These were repleted. Start on magnesium oxide.  Fatty liver Noted on CT scan. Patient has been advised to abstain from alcohol.  Chest pain  Most likely due to alcoholic gastritis. Currently resolved. Troponins were negative. EKG without any ischemic changes.  Interstitial lung disease Stable  Severe protein calorie malnutrition Continue nutritional supplements.   DVT Prophylaxis: SCDs    Code Status: Full code  Family Communication: Discussed with the patient  Disposition Plan: Remain stable. Anticipate discharge tomorrow. Continue to mobilize.     LOS: 4 days   [place] [facility] Pager [phone] 08/25/2015, 10:14 AM  If 7PM-7AM, please contact night-coverage at [email or web address], password [email or web address]

## 2015-08-26 LAB — BASIC METABOLIC PANEL
Anion gap: 6 (ref 5–15)
BUN: 5 mg/dL — AB (ref 6–20)
CALCIUM: 8.6 mg/dL — AB (ref 8.9–10.3)
CHLORIDE: 103 mmol/L (ref 101–111)
CO2: 25 mmol/L (ref 22–32)
CREATININE: 0.59 mg/dL — AB (ref 0.61–1.24)
GFR calc non Af Amer: >60 mL/min (ref >60)
GLUCOSE: 87 mg/dL (ref 65–99)
Potassium: 3.9 mmol/L (ref 3.5–5.1)
Sodium: 134 mmol/L — ABNORMAL LOW (ref 135–145)

## 2015-08-26 LAB — CBC
HEMATOCRIT: 27.4 % — AB (ref 39.0–52.0)
HEMOGLOBIN: 8.5 g/dL — AB (ref 13.0–17.0)
MCH: 22.3 pg — AB (ref 26.0–34.0)
MCHC: 31 g/dL (ref 30.0–36.0)
MCV: 71.7 fL — AB (ref 78.0–100.0)
Platelets: 270 10*3/uL (ref 150–400)
RBC: 3.82 MIL/uL — ABNORMAL LOW (ref 4.22–5.81)
WBC: 8.8 10*3/uL (ref 4.0–10.5)

## 2015-08-26 MED ORDER — LANSOPRAZOLE 15 MG PO CPDR
15.0000 mg | DELAYED_RELEASE_CAPSULE | Freq: Two times a day (BID) | ORAL | Status: DC
Start: 2015-08-26 — End: 2016-09-21

## 2015-08-26 MED ORDER — MAGNESIUM OXIDE 400 (241.3 MG) MG PO TABS: 400.0000 mg | ORAL_TABLET | Freq: Two times a day (BID) | ORAL | Status: DC

## 2015-08-26 MED ORDER — THIAMINE HCL 100 MG PO TABS: 100.0000 mg | ORAL_TABLET | Freq: Every day | ORAL | Status: DC

## 2015-08-26 MED ORDER — AMOXICILL-CLARITHRO-LANSOPRAZ PO MISC: Freq: Two times a day (BID) | ORAL | Status: DC

## 2015-08-26 MED ORDER — ADULT MULTIVITAMIN W/MINERALS CH: 1.0000 | ORAL_TABLET | Freq: Every day | ORAL | Status: DC

## 2015-08-26 NOTE — Discharge Summary (Signed)
[facility]  Physician Discharge Summary   Patient ID: James [name] MRN: [id number] DOB/AGE: 1956/07/29 59 y.o.  Admit date: 08/20/2015 Discharge date: 08/26/2015  PCP: Wyatt Haste, MD  DISCHARGE DIAGNOSES:  Principal Problem:   GIB (gastrointestinal bleeding) Active Problems:   Anemia due to GI blood loss   Tobacco abuse   Alcohol abuse   Iron deficiency anemia, unspecified   ILD (interstitial lung disease) (HCC)   ETOH abuse   Severe protein-calorie malnutrition (HCC)   Marijuana abuse   RECOMMENDATIONS FOR OUTPATIENT FOLLOW UP: 1. Patient instructed to follow-up with his PCP within one week 2. Consider checking anemia panel at follow-up   DISCHARGE CONDITION: fair  Diet recommendation: Regular  Filed Weights   08/20/15 2159 08/22/15 0600 08/22/15 2054  Weight: 42.774 kg (94 lb 4.8 oz) 46.4 kg (102 lb 4.7 oz) 50.304 kg (110 lb 14.4 oz)    INITIAL HISTORY: 59 year old African-American male with past medical history of polysubstance abuse, including alcohol abuse, iron deficiency anemia who presented with one-week history of chest pain, shortness of breath, abdominal pain and black stool. He was noted to have a hemoglobin of 3.9, with heme positive stool. He was admitted for further management. Patient underwent EGD and colonoscopy.  Consultants: Gastroenterology  Procedures:  EGD ENDOSCOPIC IMPRESSION:Large cratered clean-based gastric ulcer (lesser curvature) - no active bleeding from ulcer. Small hiatal hernia  Colonoscopy IMPRESSIONS:Large internal hemorrhoids otherwise normal colonoscopy (limited view due to suboptimal prep).   HOSPITAL COURSE:   GI bleed Patient was seen by gastroenterology. He underwent EGD. EGD revealed large gastric ulcer without active bleeding. Patient placed on Protonix infusion. This was transitioned to oral PPI. H pylori serology was positive. Discuss with gastroenterology, who recommends treatment with  combination medications including antibiotics and PPI. Patient will be discharged on Prevpac. He is not allergic to any antibiotics. This will be followed by twice daily PPI for 3 months and then once daily. He has been asked to avoid NSAIDs. Colonoscopy showed internal hemorrhoids but otherwise unremarkable, although there was a poor prep. GI does not recommend any follow-up with them.  Acute blood loss anemia Secondary to GI bleed. Patient received 4 units of packed red blood cells. Hemoglobin has responded appropriately. Anemia panel can be checked at outpatient follow-up  History of substance abuse including alcohol and tobacco Urine drug screen was also positive for opiates and marijuana. Patient has been counseled.  Hypokalemia and hypomagnesemia These were repleted. He will be discharged on magnesium oxide.  Fatty liver Noted on CT scan. Patient has been advised to abstain from alcohol.  Chest pain Most likely due to alcoholic gastritis. Currently resolved. Troponins were negative. EKG without any ischemic changes.  Interstitial lung disease Stable  Severe protein calorie malnutrition Continue nutritional supplements.  He is stable. Has been ambulating without any difficulty. Okay for discharge today.   PERTINENT LABS:  The results of significant diagnostics from this hospitalization (including imaging, microbiology, ancillary and laboratory) are listed below for reference.    Microbiology: Recent Results (from the past 240 hour(s))  MRSA PCR Screening     Status: None   Collection Time: 08/21/15  9:20 AM  Result Value Ref Range Status   MRSA by PCR NEGATIVE NEGATIVE Final    Comment:        The GeneXpert MRSA Assay (FDA approved for NASAL specimens only), is one component of a comprehensive MRSA colonization surveillance program. It is not intended to diagnose MRSA infection nor to guide or monitor  treatment for MRSA infections.      Labs: Basic Metabolic  Panel:  Recent Labs Lab 08/22/15 0612 08/23/15 0633 08/24/15 0525 08/24/15 1045 08/25/15 0530 08/26/15 0555  NA 132* 133* 134*  --  135 134*  K 3.8 3.2* 3.2*  --  3.7 3.9  CL 102 104 106  --  105 103  CO2 23 23 22   --  24 25  GLUCOSE 82 87 90  --  94 87  BUN <5* <5* <5*  --  <5* 5*  CREATININE 0.62 0.59* 0.55*  --  0.57* 0.59*  CALCIUM 8.1* 7.9* 7.8*  --  8.2* 8.6*  MG  --   --   --  1.4*  --   --    Liver Function Tests:  Recent Labs Lab 08/21/15 0527 08/22/15 0612  AST 27 21  ALT 17 15*  ALKPHOS 80 71  BILITOT 2.4* 1.0  PROT 6.4* 5.5*  ALBUMIN 2.6* 2.2*    Recent Labs Lab 08/20/15 2206  LIPASE 23   CBC:  Recent Labs Lab 08/23/15 0633 08/24/15 0525 08/24/15 1558 08/25/15 0530 08/26/15 0555  WBC 6.5 6.1 6.2 7.3 8.8  HGB 8.1* 7.4* 8.0* 7.7* 8.5*  HCT 25.7* 24.3* 26.6* 25.5* 27.4*  MCV 71.4* 71.7* 72.7* 73.1* 71.7*  PLT 281 301 301 274 270   Cardiac Enzymes:  Recent Labs Lab 08/21/15 0527 08/21/15 1340 08/21/15 1817  TROPONINI <0.03 <0.03 <0.03   BNP: BNP (last 3 results)  Recent Labs  01/01/15 1957  BNP 20.4    CBG:  Recent Labs Lab 08/21/15 0821 08/21/15 0948 08/22/15 0912  GLUCAP 92 103* 76     IMAGING STUDIES Dg Chest 2 View  08/20/2015  CLINICAL DATA:  Chest pain, shortness of breath, symptoms for 5 days but worsened today. EXAM: CHEST  2 VIEW COMPARISON:  CT chest 01/04/2015 and 01/02/2015. Chest radiograph 10/01/2011. FINDINGS: Diffuse hyperinflation suggesting emphysema. Normal heart size and pulmonary vascularity. No focal airspace disease or consolidation in the lungs. No blunting of costophrenic angles. No pneumothorax. Mediastinal contours appear intact. IMPRESSION: Emphysematous changes in the chest. No evidence of active pulmonary disease. Electronically Signed   By: Lucienne Capers M.D.   On: 08/20/2015 22:28   Ct Abdomen Pelvis W Contrast  08/21/2015  CLINICAL DATA:  Left-sided and low back pain for several days.  Generalize weakness. History of alcohol abuse, GI bleed, anemia and iron deficiency due to malnutrition. EXAM: CT ABDOMEN AND PELVIS WITH CONTRAST TECHNIQUE: Multidetector CT imaging of the abdomen and pelvis was performed using the standard protocol following bolus administration of intravenous contrast. CONTRAST:  112m OMNIPAQUE IOHEXOL 300 MG/ML  SOLN COMPARISON:  01/02/2015 FINDINGS: Lung bases are clear. Diffuse fatty infiltration of the liver. The gallbladder, spleen, pancreas, adrenal glands, kidneys, abdominal aorta, inferior vena cava, and retroperitoneal lymph nodes are unremarkable. Diffuse gastric wall thickening similar to prior study. This could indicate gastritis. Infiltrating neoplasm or lymphoma not excluded. Small bowel and colon are not abnormally distended. Contrast material flows through to the rectum without evidence of small or large bowel obstruction. No free air or free fluid in the abdomen. Pelvis: Appendix is normal. Bladder wall is not thickened. Prostate gland is not enlarged. No free or loculated pelvic fluid collections. No pelvic mass or lymphadenopathy. No destructive bone lesions. Vascular calcifications. IMPRESSION: No acute process demonstrated in the abdomen or pelvis. Gastric wall thickening is similar to previous study and likely to represent gastritis although infiltrating neoplasm or lymphoma could also  potentially have this appearance. Diffuse fatty infiltration of the liver. Electronically Signed   By: Lucienne Capers M.D.   On: 08/21/2015 03:53    DISCHARGE EXAMINATION: Filed Vitals:   08/25/15 1513 08/25/15 2348 08/25/15 2350 08/26/15 0545  BP: 116/62 111/48 102/50 133/60  Pulse: 82 76 71 79  Temp: 98.6 F (37 C) 98.5 F (36.9 C)  99.2 F (37.3 C)  TempSrc: Oral Oral  Oral  Resp: 19 16  18   Height:      Weight:      SpO2: 100% 98%  100%   General appearance: alert, cooperative, appears stated age and no distress Resp: clear to auscultation  bilaterally Cardio: regular rate and rhythm, S1, S2 normal, no murmur, click, rub or gallop GI: soft, non-tender; bowel sounds normal; no masses,  no organomegaly Extremities: extremities normal, atraumatic, no cyanosis or edema   DISPOSITION: Home  Discharge Instructions    Call MD for:  difficulty breathing, headache or visual disturbances    Complete by:  As directed      Call MD for:  extreme fatigue    Complete by:  As directed      Call MD for:  persistant dizziness or light-headedness    Complete by:  As directed      Call MD for:  persistant nausea and vomiting    Complete by:  As directed      Call MD for:  severe uncontrolled pain    Complete by:  As directed      Call MD for:  temperature >100.4    Complete by:  As directed      Diet general    Complete by:  As directed      Discharge instructions    Complete by:  As directed   Please take your medications as prescribed. Follow up with your PCP in 1 week. Stop drinking alcohol, stop smoking.  You were cared for by a hospitalist during your hospital stay. If you have any questions about your discharge medications or the care you received while you were in the hospital after you are discharged, you can call the unit and asked to speak with the hospitalist on call if the hospitalist that took care of you is not available. Once you are discharged, your primary care physician will handle any further medical issues. Please note that NO REFILLS for any discharge medications will be authorized once you are discharged, as it is imperative that you return to your primary care physician (or establish a relationship with a primary care physician if you do not have one) for your aftercare needs so that they can reassess your need for medications and monitor your lab values. If you do not have a primary care physician, you can call [phone] for a physician referral.     Increase activity slowly    Complete by:  As directed             ALLERGIES: No Known Allergies   Discharge Medication List as of 08/26/2015 12:54 PM    START taking these medications   Details  amoxicillin-clarithromycin-lansoprazole (PREVPAC) combo pack Take by mouth 2 (two) times daily. Follow package directions., Starting 08/26/2015, Until Discontinued, Print    lansoprazole (PREVACID) 15 MG capsule Take 1 capsule (15 mg total) by mouth 2 (two) times daily before a meal. START TAKING AFTER COMPLETING PREVPAC KIT., Starting 08/26/2015, Until Discontinued, Print    magnesium oxide (MAG-OX) 400 (241.3 MG) MG tablet Take 1 tablet (  400 mg total) by mouth 2 (two) times daily., Starting 08/26/2015, Until Discontinued, Print    Multiple Vitamin (MULTIVITAMIN WITH MINERALS) TABS tablet Take 1 tablet by mouth daily., Starting 08/26/2015, Until Discontinued, Print    thiamine 100 MG tablet Take 1 tablet (100 mg total) by mouth daily., Starting 08/26/2015, Until Discontinued, Print      STOP taking these medications     ferrous sulfate 325 (65 FE) MG tablet      folic acid (FOLVITE) 1 MG tablet        Follow-up Information    Follow up with Wyatt Haste, MD. Schedule an appointment as soon as possible for a visit in 1 week.   Specialty:  Family Medicine   Why:  post hospitalization follow up   Contact information:   [address] [place] [address] [phone]       Call Lear Ng., MD.   Specialty:  Gastroenterology   Why:  As needed for further problems with ulcer/stomach pain.   Contact information:   [address] [place] [address] [phone]       TOTAL DISCHARGE TIME: 35 minutes  [facility]  [facility] Pager [phone]  08/26/2015, 3:26 PM

## 2015-08-26 NOTE — Discharge Instructions (Signed)
Peptic Ulcer A peptic ulcer is a sore in the lining of your esophagus (esophageal ulcer), stomach (gastric ulcer), or in the first part of your small intestine (duodenal ulcer). The ulcer causes erosion into the deeper tissue. CAUSES  Normally, the lining of the stomach and the small intestine protects itself from the acid that digests food. The protective lining can be damaged by:  An infection caused by a bacterium called Helicobacter pylori (H. pylori).  Regular use of nonsteroidal anti-inflammatory drugs (NSAIDs), such as ibuprofen or aspirin.  Smoking tobacco. Other risk factors include being older than 33, drinking alcohol excessively, and having a family history of ulcer disease.  SYMPTOMS   Burning pain or gnawing in the area between the chest and the belly button.  Heartburn.  Nausea and vomiting.  Bloating. The pain can be worse on an empty stomach and at night. If the ulcer results in bleeding, it can cause:  Black, tarry stools.  Vomiting of bright red blood.  Vomiting of coffee-ground-looking materials. DIAGNOSIS  A diagnosis is usually made based upon your history and an exam. Other tests and procedures may be performed to find the cause of the ulcer. Finding a cause will help determine the best treatment. Tests and procedures may include:  Blood tests, stool tests, or breath tests to check for the bacterium H. pylori.  An upper gastrointestinal (GI) series of the esophagus, stomach, and small intestine.  An endoscopy to examine the esophagus, stomach, and small intestine.  A biopsy. TREATMENT  Treatment may include:  Eliminating the cause of the ulcer, such as smoking, NSAIDs, or alcohol.  Medicines to reduce the amount of acid in your digestive tract.  Antibiotic medicines if the ulcer is caused by the H. pylori bacterium.  An upper endoscopy to treat a bleeding ulcer.  Surgery if the bleeding is severe or if the ulcer created a hole somewhere in the  digestive system. HOME CARE INSTRUCTIONS   Avoid tobacco, alcohol, and caffeine. Smoking can increase the acid in the stomach, and continued smoking will impair the healing of ulcers.  Avoid foods and drinks that seem to cause discomfort or aggravate your ulcer.  Only take medicines as directed by your caregiver. Do not substitute over-the-counter medicines for prescription medicines without talking to your caregiver.  Keep any follow-up appointments and tests as directed. SEEK MEDICAL CARE IF:   Your do not improve within 7 days of starting treatment.  You have ongoing indigestion or heartburn. SEEK IMMEDIATE MEDICAL CARE IF:   You have sudden, sharp, or persistent abdominal pain.  You have bloody or dark black, tarry stools.  You vomit blood or vomit that looks like coffee grounds.  You become light-headed, weak, or feel faint.  You become sweaty or clammy. MAKE SURE YOU:   Understand these instructions.  Will watch your condition.  Will get help right away if you are not doing well or get worse.   This information is not intended to replace advice given to you by your health care provider. Make sure you discuss any questions you have with your health care provider.   Document Released: 08/22/2000 Document Revised: 09/15/2014 Document Reviewed: 03/24/2012 Elsevier Interactive Patient Education 2016 Elsevier [employer]   Helicobacter Pylori Antibodies Test WHY AM I HAVING THIS TEST? This is a blood test that looks for bacteria called Helicobacter pylori (H. pylori). H. pylori is a germ that can be found in the cells that line the stomach. Having high levels of H. pylori in  your stomach puts you at risk for stomach ulcers and small bowel ulcers, long-term (chronic) inflammation of the lining of the stomach, or even ulcers that may occur in the canal that runs from the mouth to the stomach (esophagus). Presence of H. pylori can also increase your risk for stomach cancer if it is  left untreated. Most people with H. pylori in their stomach bacteria have no symptoms. Your health care provider may ask you to have this test if you have symptoms of a stomach ulcer or small bowel ulcer, such as stomach pain before or after eating, heartburn, or nausea repeatedly after eating. WHAT KIND OF SAMPLE IS TAKEN? A blood sample is required for this test. It is usually collected by inserting a needle into a vein or sticking a finger with a small needle. HOW DO I PREPARE FOR THE TEST? There is no preparation required for this test. WHAT ARE THE REFERENCE RANGES? Reference ranges are considered healthy ranges established after testing a large group of healthy people. Reference ranges may vary among different people, labs, and hospitals. It is your responsibility to obtain your test results. Ask the lab or department performing the test when and how you will get your results. Your test results will be reported as positive, negative, or equivocal. Equivocal means that your results are neither positive nor negative. References ranges for these results are as follows:  Less than or equal to 30 units/mL. This is negative.  Greater than or equal to 40 units/mL. This is positive.  30.01-39.99 units/mL. This is equivocal. WHAT DO THE RESULTS MEAN? Test results that are higher than normal may indicate numerous health conditions. These may include:  Short-term or long-term irritation of the stomach lining (gastritis).  Small bowel ulcer.  Stomach ulcer.  Stomach cancer. Talk with your health care provider to discuss your results, treatment options, and if necessary, the need for more tests. Talk with your health care provider if you have any questions about your results.   This information is not intended to replace advice given to you by your health care provider. Make sure you discuss any questions you have with your health care provider.   Document Released: 09/18/2004 Document  Revised: 09/15/2014 Document Reviewed: 01/06/2014 Elsevier Interactive Patient Education [employer].

## 2015-08-26 NOTE — Progress Notes (Signed)
Reviewed discharge instructions with pt.  PIV removed.  Prescriptions given.  Pt taken to discharge location via wheelchair.

## 2015-08-27 ENCOUNTER — Telehealth: Payer: Self-pay | Admitting: Family Medicine

## 2015-08-27 NOTE — Telephone Encounter (Signed)
Left message with person that answered phone. Pt was recently in hospital. Needs follow up appt.

## 2015-08-28 NOTE — Telephone Encounter (Signed)
Called pt back today and spoke to him. I asked how he was doing and he stated he was ok now. Pt stated he went to pick up his med that hospital gave him and pharmacy didn't have it. He was going back today to pick up. Hospital follow up was made for next week and pt was told to bring all his medications with him. Pt said he would. Pt was told if he needed anything to call the office. This call was not made during the first 24 hours. I called yesterday and left a message but pt did not call me back.

## 2015-09-05 ENCOUNTER — Encounter: Payer: Self-pay | Admitting: Family Medicine

## 2015-09-05 ENCOUNTER — Ambulatory Visit (INDEPENDENT_AMBULATORY_CARE_PROVIDER_SITE_OTHER): Payer: BC Managed Care – PPO | Admitting: Family Medicine

## 2015-09-05 VITALS — BP 100/56 | HR 80 | Resp 14 | Wt 108.4 lb

## 2015-09-05 DIAGNOSIS — F1210 Cannabis abuse, uncomplicated: Secondary | ICD-10-CM

## 2015-09-05 DIAGNOSIS — Z720 Tobacco use: Secondary | ICD-10-CM | POA: Diagnosis not present

## 2015-09-05 DIAGNOSIS — D500 Iron deficiency anemia secondary to blood loss (chronic): Secondary | ICD-10-CM

## 2015-09-05 DIAGNOSIS — K253 Acute gastric ulcer without hemorrhage or perforation: Secondary | ICD-10-CM | POA: Diagnosis not present

## 2015-09-05 DIAGNOSIS — B9681 Helicobacter pylori [H. pylori] as the cause of diseases classified elsewhere: Secondary | ICD-10-CM | POA: Diagnosis not present

## 2015-09-05 DIAGNOSIS — F1010 Alcohol abuse, uncomplicated: Secondary | ICD-10-CM

## 2015-09-05 LAB — COMPREHENSIVE METABOLIC PANEL
ALBUMIN: 3.3 g/dL — AB (ref 3.6–5.1)
ALK PHOS: 93 U/L (ref 40–115)
ALT: 18 U/L (ref 9–46)
AST: 30 U/L (ref 10–35)
BILIRUBIN TOTAL: 0.5 mg/dL (ref 0.2–1.2)
BUN: 8 mg/dL (ref 7–25)
CALCIUM: 9.2 mg/dL (ref 8.6–10.3)
CO2: 23 mmol/L (ref 20–31)
Chloride: 107 mmol/L (ref 98–110)
Creat: 0.49 mg/dL — ABNORMAL LOW (ref 0.70–1.33)
GLUCOSE: 70 mg/dL (ref 65–99)
Potassium: 4.9 mmol/L (ref 3.5–5.3)
Sodium: 141 mmol/L (ref 135–146)
Total Protein: 7.2 g/dL (ref 6.1–8.1)

## 2015-09-05 NOTE — Progress Notes (Signed)
   Subjective:    Patient ID: James [name], male    DOB: September 14, 1955, 59 y.o.   MRN: [id number]  HPI  He is here for hospital follow-up visit. He was admitted on December 13 10 pound have a hemoglobin of 3.9. Subsequent workup did show a gastric ulcer. He was also H. pylori positive. Initial workup also showed urine positive for marijuana and opiates. He also has a long history of alcohol abuse. He was seen by GI as mentioned above. Presently he is on triple antibiotic as well as PPI. He does not complain of chest pain, shortness of breath, nausea, vomiting. His stools are apparently normal in color. He continues to smoke and also admits to continuing to drink.  Review of Systems     Objective:   Physical Exam Alert and in no distress otherwise not examined. He smells of tobacco.       Assessment & Plan:  Alcohol abuse  Anemia due to GI blood loss - Plan: CBC with Differential/Platelet, Comprehensive metabolic panel  Tobacco abuse  Gastric ulcer due to Helicobacter pylori, acute - Plan: CBC with Differential/Platelet, Comprehensive metabolic panel  ETOH abuse  Marijuana abuse  I discussed in detail with him and his wife his anemia, drug and alcohol use. He states that he plans to try and do this on his own and I strongly encouraged him to get help with this and to stop all drug, alcohol and tobacco use. Commended easier [facility] or the [facility]. Also discussed the fact that his wife should go to [facility]. Over half an hour, greater than 50% spent in counseling and coordination of care. Recheck here in one month.

## 2015-09-06 LAB — CBC WITH DIFFERENTIAL/PLATELET
BASOS ABS: 0.1 10*3/uL (ref 0.0–0.1)
Basophils Absolute: 0.1 10*3/uL (ref 0.0–0.1)
Basophils Relative: 1 % (ref 0–1)
Basophils Relative: 2 % — ABNORMAL HIGH (ref 0–1)
EOS ABS: 0 10*3/uL (ref 0.0–0.7)
Eosinophils Absolute: 0.1 10*3/uL (ref 0.0–0.7)
Eosinophils Relative: 0 % (ref 0–5)
Eosinophils Relative: 1 % (ref 0–5)
HCT: 23.2 % — ABNORMAL LOW (ref 39.0–52.0)
HEMATOCRIT: 29.6 % — AB (ref 39.0–52.0)
HEMOGLOBIN: 8.7 g/dL — AB (ref 13.0–17.0)
Hemoglobin: 6.5 g/dL — CL (ref 13.0–17.0)
LYMPHS PCT: 29 % (ref 12–46)
Lymphocytes Relative: 9 % — ABNORMAL LOW (ref 12–46)
Lymphs Abs: 0.9 10*3/uL (ref 0.7–4.0)
Lymphs Abs: 2.1 10*3/uL (ref 0.7–4.0)
MCH: 18.6 pg — AB (ref 26.0–34.0)
MCH: 21.4 pg — ABNORMAL LOW (ref 26.0–34.0)
MCHC: 28 g/dL — ABNORMAL LOW (ref 30.0–36.0)
MCHC: 29.4 g/dL — ABNORMAL LOW (ref 30.0–36.0)
MCV: 66.3 fL — AB (ref 78.0–100.0)
MCV: 72.9 fL — ABNORMAL LOW (ref 78.0–100.0)
MONO ABS: 1 10*3/uL (ref 0.1–1.0)
MPV: 9 fL (ref 8.6–12.4)
Monocytes Absolute: 0.6 10*3/uL (ref 0.1–1.0)
Monocytes Relative: 10 % (ref 3–12)
Monocytes Relative: 8 % (ref 3–12)
NEUTROS ABS: 8.3 10*3/uL — AB (ref 1.7–7.7)
NEUTROS ABS: 4.4 10*3/uL (ref 1.7–7.7)
Neutrophils Relative %: 60 % (ref 43–77)
Neutrophils Relative %: 80 % — ABNORMAL HIGH (ref 43–77)
Platelets: 242 10*3/uL (ref 150–400)
Platelets: 529 10*3/uL — ABNORMAL HIGH (ref 150–400)
RBC: 4.06 MIL/uL — AB (ref 4.22–5.81)
RBC: 3.5 MIL/uL — ABNORMAL LOW (ref 4.22–5.81)
RDW: 28.1 % — ABNORMAL HIGH (ref 11.5–15.5)
RDW: 30 % — ABNORMAL HIGH (ref 11.5–15.5)
WBC: 10.3 10*3/uL (ref 4.0–10.5)
WBC: 7.4 10*3/uL (ref 4.0–10.5)

## 2015-09-12 ENCOUNTER — Encounter: Payer: Self-pay | Admitting: Internal Medicine

## 2015-10-08 ENCOUNTER — Ambulatory Visit: Payer: BC Managed Care – PPO | Admitting: Family Medicine

## 2015-10-12 ENCOUNTER — Encounter: Payer: Self-pay | Admitting: Family Medicine

## 2016-09-17 ENCOUNTER — Inpatient Hospital Stay (HOSPITAL_COMMUNITY)
Admission: EM | Admit: 2016-09-17 | Discharge: 2016-09-21 | DRG: 811 | Disposition: A | Discharge status: Home Health | Payer: BC Managed Care – PPO | Attending: Internal Medicine | Admitting: Internal Medicine

## 2016-09-17 ENCOUNTER — Encounter (HOSPITAL_COMMUNITY): Payer: Self-pay | Admitting: Emergency Medicine

## 2016-09-17 DIAGNOSIS — K766 Portal hypertension: Secondary | ICD-10-CM | POA: Diagnosis present

## 2016-09-17 DIAGNOSIS — I351 Nonrheumatic aortic (valve) insufficiency: Secondary | ICD-10-CM | POA: Diagnosis present

## 2016-09-17 DIAGNOSIS — K2921 Alcoholic gastritis with bleeding: Secondary | ICD-10-CM | POA: Diagnosis not present

## 2016-09-17 DIAGNOSIS — R0609 Other forms of dyspnea: Secondary | ICD-10-CM

## 2016-09-17 DIAGNOSIS — R64 Cachexia: Secondary | ICD-10-CM | POA: Diagnosis present

## 2016-09-17 DIAGNOSIS — K3189 Other diseases of stomach and duodenum: Secondary | ICD-10-CM | POA: Diagnosis present

## 2016-09-17 DIAGNOSIS — Z8711 Personal history of peptic ulcer disease: Secondary | ICD-10-CM | POA: Diagnosis not present

## 2016-09-17 DIAGNOSIS — K5900 Constipation, unspecified: Secondary | ICD-10-CM | POA: Diagnosis present

## 2016-09-17 DIAGNOSIS — D62 Acute posthemorrhagic anemia: Principal | ICD-10-CM | POA: Diagnosis present

## 2016-09-17 DIAGNOSIS — D500 Iron deficiency anemia secondary to blood loss (chronic): Secondary | ICD-10-CM | POA: Diagnosis not present

## 2016-09-17 DIAGNOSIS — F17210 Nicotine dependence, cigarettes, uncomplicated: Secondary | ICD-10-CM | POA: Diagnosis present

## 2016-09-17 DIAGNOSIS — K219 Gastro-esophageal reflux disease without esophagitis: Secondary | ICD-10-CM | POA: Diagnosis present

## 2016-09-17 DIAGNOSIS — E876 Hypokalemia: Secondary | ICD-10-CM | POA: Diagnosis present

## 2016-09-17 DIAGNOSIS — F1010 Alcohol abuse, uncomplicated: Secondary | ICD-10-CM | POA: Diagnosis present

## 2016-09-17 DIAGNOSIS — Z681 Body mass index (BMI) 19 or less, adult: Secondary | ICD-10-CM

## 2016-09-17 DIAGNOSIS — K922 Gastrointestinal hemorrhage, unspecified: Secondary | ICD-10-CM | POA: Diagnosis present

## 2016-09-17 DIAGNOSIS — E43 Unspecified severe protein-calorie malnutrition: Secondary | ICD-10-CM | POA: Diagnosis present

## 2016-09-17 DIAGNOSIS — Z86010 Personal history of colonic polyps: Secondary | ICD-10-CM

## 2016-09-17 DIAGNOSIS — D649 Anemia, unspecified: Secondary | ICD-10-CM

## 2016-09-17 LAB — CBC
HCT: 11.2 % — ABNORMAL LOW (ref 39.0–52.0)
HCT: 11.1 % — ABNORMAL LOW (ref 39.0–52.0)
HEMOGLOBIN: 2.7 g/dL — AB (ref 13.0–17.0)
Hemoglobin: 2.7 g/dL — CL (ref 13.0–17.0)
MCH: 13.1 pg — ABNORMAL LOW (ref 26.0–34.0)
MCH: 13.5 pg — ABNORMAL LOW (ref 26.0–34.0)
MCHC: 24.1 g/dL — AB (ref 30.0–36.0)
MCHC: 24.3 g/dL — ABNORMAL LOW (ref 30.0–36.0)
MCV: 54.4 fL — ABNORMAL LOW (ref 78.0–100.0)
MCV: 55.5 fL — ABNORMAL LOW (ref 78.0–100.0)
PLATELETS: 259 10*3/uL (ref 150–400)
PLATELETS: 245 10*3/uL (ref 150–400)
RBC: 2.06 MIL/uL — ABNORMAL LOW (ref 4.22–5.81)
RBC: 2 MIL/uL — AB (ref 4.22–5.81)
RDW: 23.7 % — AB (ref 11.5–15.5)
RDW: 24 % — ABNORMAL HIGH (ref 11.5–15.5)
WBC: 9.1 10*3/uL (ref 4.0–10.5)
WBC: 9.1 10*3/uL (ref 4.0–10.5)

## 2016-09-17 LAB — COMPREHENSIVE METABOLIC PANEL
ALBUMIN: 3.2 g/dL — AB (ref 3.5–5.0)
ALK PHOS: 161 U/L — AB (ref 38–126)
ALT: 20 U/L (ref 17–63)
AST: 36 U/L (ref 15–41)
Anion gap: 15 (ref 5–15)
BILIRUBIN TOTAL: 0.4 mg/dL (ref 0.3–1.2)
CALCIUM: 8.6 mg/dL — AB (ref 8.9–10.3)
CO2: 20 mmol/L — ABNORMAL LOW (ref 22–32)
Chloride: 97 mmol/L — ABNORMAL LOW (ref 101–111)
Creatinine, Ser: 0.57 mg/dL — ABNORMAL LOW (ref 0.61–1.24)
GFR calc Af Amer: >60 mL/min (ref >60)
GLUCOSE: 82 mg/dL (ref 65–99)
POTASSIUM: 3.4 mmol/L — AB (ref 3.5–5.1)
Sodium: 132 mmol/L — ABNORMAL LOW (ref 135–145)
TOTAL PROTEIN: 7.8 g/dL (ref 6.5–8.1)

## 2016-09-17 LAB — LIPASE, BLOOD: Lipase: 16 U/L (ref 11–51)

## 2016-09-17 LAB — POC OCCULT BLOOD, ED: Fecal Occult Bld: NEGATIVE

## 2016-09-17 LAB — IRON AND TIBC: TIBC: 405 ug/dL (ref 250–450)

## 2016-09-17 LAB — PREPARE RBC (CROSSMATCH)

## 2016-09-17 LAB — FERRITIN: Ferritin: 4 ng/mL — ABNORMAL LOW (ref 24–336)

## 2016-09-17 MED ORDER — PANTOPRAZOLE SODIUM 40 MG IV SOLR
40.0000 mg | Freq: Once | INTRAVENOUS | Status: AC
  Administered 2016-09-17: 40 mg via INTRAVENOUS
  Filled 2016-09-17: qty 40

## 2016-09-17 MED ORDER — PANTOPRAZOLE SODIUM 40 MG IV SOLR
40.0000 mg | Freq: Two times a day (BID) | INTRAVENOUS | Status: DC
  Filled 2016-09-17: qty 40

## 2016-09-17 MED ORDER — SODIUM CHLORIDE 0.9 % IV SOLN
10.0000 mL/h | Freq: Once | INTRAVENOUS | Status: AC
  Administered 2016-09-17: 10 mL/h via INTRAVENOUS

## 2016-09-17 MED ORDER — THIAMINE HCL 100 MG/ML IJ SOLN
100.0000 mg | Freq: Every day | INTRAMUSCULAR | Status: DC
  Administered 2016-09-17 – 2016-09-19 (×2): 100 mg via INTRAVENOUS
  Filled 2016-09-17 (×2): qty 2

## 2016-09-17 MED ORDER — MAGNESIUM OXIDE 400 (241.3 MG) MG PO TABS
400.0000 mg | ORAL_TABLET | Freq: Two times a day (BID) | ORAL | Status: DC
  Administered 2016-09-17 – 2016-09-20 (×6): 400 mg via ORAL
  Filled 2016-09-17 (×6): qty 1

## 2016-09-17 MED ORDER — VITAMIN B-1 100 MG PO TABS
100.0000 mg | ORAL_TABLET | Freq: Every day | ORAL | Status: DC
  Administered 2016-09-18 – 2016-09-21 (×3): 100 mg via ORAL
  Filled 2016-09-17 (×3): qty 1

## 2016-09-17 MED ORDER — LORAZEPAM 2 MG/ML IJ SOLN: 1.0000 mg | Freq: Four times a day (QID) | INTRAMUSCULAR | Status: AC | PRN

## 2016-09-17 MED ORDER — FERROUS SULFATE 325 (65 FE) MG PO TABS
325.0000 mg | ORAL_TABLET | Freq: Three times a day (TID) | ORAL | Status: DC
  Filled 2016-09-17: qty 1

## 2016-09-17 MED ORDER — SODIUM CHLORIDE 0.9 % IV SOLN
8.0000 mg/h | INTRAVENOUS | Status: DC
  Administered 2016-09-17 – 2016-09-20 (×3): 8 mg/h via INTRAVENOUS
  Filled 2016-09-17 (×13): qty 80

## 2016-09-17 MED ORDER — LORAZEPAM 1 MG PO TABS
1.0000 mg | ORAL_TABLET | Freq: Four times a day (QID) | ORAL | Status: AC | PRN
  Administered 2016-09-19: 1 mg via ORAL
  Filled 2016-09-17 (×2): qty 1

## 2016-09-17 MED ORDER — FOLIC ACID 1 MG PO TABS
1.0000 mg | ORAL_TABLET | Freq: Every day | ORAL | Status: DC
  Administered 2016-09-17 – 2016-09-21 (×5): 1 mg via ORAL
  Filled 2016-09-17 (×5): qty 1

## 2016-09-17 MED ORDER — ADULT MULTIVITAMIN W/MINERALS CH
1.0000 | ORAL_TABLET | Freq: Every day | ORAL | Status: DC
  Administered 2016-09-17 – 2016-09-21 (×5): 1 via ORAL
  Filled 2016-09-17 (×5): qty 1

## 2016-09-17 NOTE — ED Notes (Signed)
Nurse will get labs 

## 2016-09-17 NOTE — H&P (Signed)
History and Physical    [name] GHW:[id number] DOB: September 05, 1956 DOA: 09/17/2016   PCP: Wyatt Haste, MD Chief Complaint:  Chief Complaint  Patient presents with  . Constipation  . Extremity Weakness  . Abdominal Pain    HPI: [name] is a 61 y.o. male with medical history significant of ongoing EtOH abuse, H.Pylori ulcer causing GIB, anemia with HGB 3.5 back in Dec 2016.  Prescribed treatment but dosent remember if he ever took this.  Patient presents to the ED with c/o not having BM in 7 days, generalized weakness, fatigue.  Symptoms have been progressively worsening, onset was insidious (over weeks to months).  When he tries to eat food it "gets stuck in my chest".  ED Course: HGB 2.7, guiac negative.  Transfusing 2 units PRBC.  Iron level is undetectable on anemia pnl.  Review of Systems: As per HPI otherwise 10 point review of systems negative.    Past Medical History:  Diagnosis Date  . Alcohol abuse   . Anemia due to GI blood loss 09/2011   microcytic.  transfused for Hgb 3.5, MCV in 50s.   . Aortic insufficiency   . Benign neoplasm of colon   . Black stools 08/21/2015  . Cachexia ([place])   . Chest pain 08/21/2015  . Dysphagia   . ED (erectile dysfunction)   . Edema   . GERD (gastroesophageal reflux disease)   . ILD (interstitial lung disease) ([place])   . Iron deficiency anemia, unspecified   . Pulmonary nodule   . Reflux esophagitis   . Smoker   . Tobacco abuse   . Weight loss, abnormal     Past Surgical History:  Procedure Laterality Date  . COLONOSCOPY  10/03/2011   Procedure: COLONOSCOPY;  Surgeon: Scarlette Shorts, MD;  Location: [name];  Service: Endoscopy;  Laterality: N/A;  . COLONOSCOPY WITH PROPOFOL N/A 08/22/2015   Procedure: COLONOSCOPY WITH PROPOFOL;  Surgeon: Wilford Corner, MD;  [facility] END[facility]COPY;  Service: Endoscopy;  Laterality: N/A;  . ESOPHAGOGASTRODUODENOSCOPY  10/02/2011   Procedure: ESOPHAGOGASTRODUODENOSCOPY (EGD);   Surgeon: Scarlette Shorts, MD;  Location: [facility] ENDOSCOPY;  Service: Endoscopy;  Laterality: N/A;  . ESOPHAGOGASTRODUODENOSCOPY N/A 01/05/2015   Procedure: ESOPHAGOGASTRODUODENOSCOPY (EGD);  Surgeon: Inda Castle, MD;  Location: [place];  Service: Endoscopy;  Laterality: N/A;  . ESOPHAGOGASTRODUODENOSCOPY (EGD) WITH PROPOFOL N/A 08/22/2015   Procedure: ESOPHAGOGASTRODUODENOSCOPY (EGD) WITH PROPOFOL;  Surgeon: Wilford Corner, MD;  Location: [facility] ENDOSCOPY;  Service: Endoscopy;  Laterality: N/A;     reports that he has been smoking Cigarettes.  He has a 20.00 pack-year smoking history. He has never used smokeless tobacco. He reports that he drinks about 1.2 oz of alcohol per week . He reports that he uses drugs, including Marijuana.  No Known Allergies  Family History  Problem Relation Age of Onset  . Hypertension Mother   . Diabetes Maternal Grandfather       Prior to Admission medications   Medication Sig Start Date End Date Taking? Authorizing Provider  amoxicillin-clarithromycin-lansoprazole [facility]) combo pack Take by mouth 2 (two) times daily. Follow package directions. Patient not taking: Reported on 09/17/2016 08/26/15   Bonnielee Haff, MD  lansoprazole (PREVACID) 15 MG capsule Take 1 capsule (15 mg total) by mouth 2 (two) times daily before a meal. START TAKING AFTER COMPLETING PREVPAC KIT. Patient not taking: Reported on 09/17/2016 08/26/15   Bonnielee Haff, MD  magnesium oxide (MAG-OX) 400 (241.3 MG) MG tablet Take 1 tablet (400 mg total) by mouth  2 (two) times daily. Patient not taking: Reported on 09/17/2016 08/26/15   Bonnielee Haff, MD  Multiple Vitamin (MULTIVITAMIN WITH MINERALS) TABS tablet Take 1 tablet by mouth daily. Patient not taking: Reported on 09/17/2016 08/26/15   Bonnielee Haff, MD  thiamine 100 MG tablet Take 1 tablet (100 mg total) by mouth daily. Patient not taking: Reported on 09/17/2016 08/26/15   Bonnielee Haff, MD    Physical Exam: Vitals:   09/17/16 2144  09/17/16 2144 09/17/16 2151 09/17/16 2159  BP: 103/62 103/62 103/62 102/60  Pulse: 99 99 102 96  Resp: 18 18  19   Temp: 98.1 F (36.7 C) 98.1 F (36.7 C)  98.5 F (36.9 C)  TempSrc: Oral Oral  Oral  SpO2:  100%  100%  Weight:      Height:          Constitutional: NAD, calm, comfortable Eyes: PERRL, lids and conjunctivae normal ENMT: Mucous membranes are moist. Posterior pharynx clear of any exudate or lesions.Normal dentition.  Neck: normal, supple, no masses, no thyromegaly Respiratory: clear to auscultation bilaterally, no wheezing, no crackles. Normal respiratory effort. No accessory muscle use.  Cardiovascular: Regular rate and rhythm, no murmurs / rubs / gallops. No extremity edema. 2+ pedal pulses. No carotid bruits.  Abdomen: no tenderness, no masses palpated. No hepatosplenomegaly. Bowel sounds positive.  Musculoskeletal: no clubbing / cyanosis. No joint deformity upper and lower extremities. Good ROM, no contractures. Normal muscle tone.  Skin: no rashes, lesions, ulcers. No induration Neurologic: CN 2-12 grossly intact. Sensation intact, DTR normal. Strength 5/5 in all 4.  Psychiatric: Normal judgment and insight. Alert and oriented x 3. Normal mood.    Labs on Admission: I have personally reviewed following labs and imaging studies  CBC:  Recent Labs Lab 09/17/16 1759 09/17/16 2000  WBC 9.1 9.1  HGB 2.7* 2.7*  HCT 11.2* 11.1*  MCV 54.4* 55.5*  PLT 259 010   Basic Metabolic Panel:  Recent Labs Lab 09/17/16 1759  NA 132*  K 3.4*  CL 97*  CO2 20*  GLUCOSE 82  BUN <5*  CREATININE 0.57*  CALCIUM 8.6*   GFR: Estimated Creatinine Clearance: 77.4 mL/min (by C-G formula based on SCr of 0.57 mg/dL (L)). Liver Function Tests:  Recent Labs Lab 09/17/16 1759  AST 36  ALT 20  ALKPHOS 161*  BILITOT 0.4  PROT 7.8  ALBUMIN 3.2*    Recent Labs Lab 09/17/16 1759  LIPASE 16   No results for input(s): AMMONIA in the last 168 hours. Coagulation  Profile: No results for input(s): INR, PROTIME in the last 168 hours. Cardiac Enzymes: No results for input(s): CKTOTAL, CKMB, CKMBINDEX, TROPONINI in the last 168 hours. BNP (last 3 results) No results for input(s): PROBNP in the last 8760 hours. HbA1C: No results for input(s): HGBA1C in the last 72 hours. CBG: No results for input(s): GLUCAP in the last 168 hours. Lipid Profile: No results for input(s): CHOL, HDL, LDLCALC, TRIG, CHOLHDL, LDLDIRECT in the last 72 hours. Thyroid Function Tests: No results for input(s): TSH, T4TOTAL, FREET4, T3FREE, THYROIDAB in the last 72 hours. Anemia Panel:  Recent Labs  09/17/16 2000 09/17/16 2010  FERRITIN 4*  --   TIBC  --  405  IRON  --  <5*   Urine analysis: No results found for: COLORURINE, APPEARANCEUR, LABSPEC, PHURINE, GLUCOSEU, HGBUR, BILIRUBINUR, KETONESUR, PROTEINUR, UROBILINOGEN, NITRITE, LEUKOCYTESUR Sepsis Labs: @LABRCNTIP (procalcitonin:4,lacticidven:4) )No results found for this or any previous visit (from the past 240 hour(s)).   Radiological Exams on  Admission: No results found.  EKG: Independently reviewed.  Assessment/Plan Principal Problem:   Iron deficiency anemia due to chronic blood loss Active Problems:   Anemia due to GI blood loss   Alcohol abuse   Alcoholic gastritis with hemorrhage    1. Iron def anemia due to chronic blood loss - likely due to UGI source / untreated H.Pylori ulcer 1. Transfuse 2 units PRBC 2. Repeat CBC in AM 3. Call GI in AM patient has numerous potential indications for upper EGD even with negative guiac stool: 1. Concern for untreated H.Pylori ulcer / progression to MALT lymphoma 2. Concern Dysphagia with food "getting stuck in throat 3. Probably a good idea given his ultra heavy EtOH use to rule out interval development of esophageal / gastric varices 4. Etc. 4. Iron Sulfate PO TID ordered 5. NPO except for ice chips, water 6. PPI GTT 7. Tele monitor   DVT prophylaxis:  SCDs Code Status: Full Family Communication: Wife at bedside Consults called: None Admission status: Admit to inpatient   Etta Quill DO [facility] Pager [phone] from 7PM-7AM  If 7AM-7PM, please contact the day physician for the patient [email or web address] Password [email or web address]  09/17/2016, 10:12 PM

## 2016-09-17 NOTE — ED Triage Notes (Signed)
Pt. Stated, [name] not had a bowel movement in 7 days and I can't walk but 7 steps.  When I eat it gets stuck in my chest.

## 2016-09-17 NOTE — ED Notes (Signed)
Critical hgb reported to Dr. Leonette Monarch

## 2016-09-17 NOTE — ED Provider Notes (Signed)
[place] DEPT Provider Note   CSN: [id number] Arrival date & time: 09/17/16  1636     History   Chief Complaint Chief Complaint  Patient presents with  . Constipation  . Extremity Weakness  . Abdominal Pain    HPI [name] is a 61 y.o. male.  The history is provided by the patient.  Shortness of Breath  This is a new problem. The average episode lasts 1 week. The problem occurs intermittently.The current episode started more than 1 week ago. The problem has been gradually worsening. Associated symptoms include abdominal pain. Pertinent negatives include no fever, no headaches, no cough, no chest pain, no syncope and no vomiting. He has had prior hospitalizations. Associated medical issues include chronic lung disease.  -pt has had worsening fatigue over last few weeks and DOE last 1 week -Pt has not had BM in 1 wk but endorses black stools before that     Past Medical History:  Diagnosis Date  . Alcohol abuse   . Anemia due to GI blood loss 09/2011   microcytic.  transfused for Hgb 3.5, MCV in 50s.   . Aortic insufficiency   . Benign neoplasm of colon   . Black stools 08/21/2015  . Cachexia ([place])   . Chest pain 08/21/2015  . Dysphagia   . ED (erectile dysfunction)   . Edema   . GERD (gastroesophageal reflux disease)   . ILD (interstitial lung disease) ([place])   . Iron deficiency anemia, unspecified   . Pulmonary nodule   . Reflux esophagitis   . Smoker   . Tobacco abuse   . Weight loss, abnormal     Patient Active Problem List   Diagnosis Date Noted  . Alcoholic gastritis with hemorrhage   . Marijuana abuse   . Severe protein-calorie malnutrition ([place]) 01/02/2015  . ILD (interstitial lung disease) ([place]) 10/24/2011  . Pulmonary nodule 10/24/2011  . Cachexia ([place]) 10/24/2011  . Benign neoplasm of colon 10/03/2011  . Anemia due to GI blood loss 10/01/2011  . Tobacco abuse 10/01/2011  . Alcohol abuse 10/01/2011  . Dysphagia, unspecified(787.20)  10/01/2011    Past Surgical History:  Procedure Laterality Date  . COLONOSCOPY  10/03/2011   Procedure: COLONOSCOPY;  Surgeon: Scarlette Shorts, MD;  Location: [place];  Service: Endoscopy;  Laterality: N/A;  . COLONOSCOPY WITH PROPOFOL N/A 08/22/2015   Procedure: COLONOSCOPY WITH PROPOFOL;  Surgeon: Wilford Corner, MD;  Location: [facility] ENDOSCOPY;  Service: Endoscopy;  Laterality: N/A;  . ESOPHAGOGASTRODUODENOSCOPY  10/02/2011   Procedure: ESOPHAGOGASTRODUODENOSCOPY (EGD);  Surgeon: Scarlette Shorts, MD;  Location: [facility] ENDOSCOPY;  Service: Endoscopy;  Laterality: N/A;  . ESOPHAGOGASTRODUODENOSCOPY N/A 01/05/2015   Procedure: ESOPHAGOGASTRODUODENOSCOPY (EGD);  Surgeon: Inda Castle, MD;  Location: [place];  Service: Endoscopy;  Laterality: N/A;  . ESOPHAGOGASTRODUODENOSCOPY (EGD) WITH PROPOFOL N/A 08/22/2015   Procedure: ESOPHAGOGASTRODUODENOSCOPY (EGD) WITH PROPOFOL;  Surgeon: Wilford Corner, MD;  Location: [facility] ENDOSCOPY;  Service: Endoscopy;  Laterality: N/A;       Home Medications    Prior to Admission medications   Medication Sig Start Date End Date Taking? Authorizing Provider  amoxicillin-clarithromycin-lansoprazole [facility]) combo pack Take by mouth 2 (two) times daily. Follow package directions. 08/26/15   Bonnielee Haff, MD  lansoprazole (PREVACID) 15 MG capsule Take 1 capsule (15 mg total) by mouth 2 (two) times daily before a meal. START TAKING AFTER COMPLETING PREVPAC KIT. 08/26/15   Bonnielee Haff, MD  magnesium oxide (MAG-OX) 400 (241.3 MG) MG tablet Take 1 tablet (400 mg  total) by mouth 2 (two) times daily. 08/26/15   Bonnielee Haff, MD  Multiple Vitamin (MULTIVITAMIN WITH MINERALS) TABS tablet Take 1 tablet by mouth daily. 08/26/15   Bonnielee Haff, MD  thiamine 100 MG tablet Take 1 tablet (100 mg total) by mouth daily. 08/26/15   Bonnielee Haff, MD    Family History Family History  Problem Relation Age of Onset  . Hypertension Mother   . Diabetes Maternal Grandfather       Social History Social History  Substance Use Topics  . Smoking status: Current Every Day Smoker    Packs/day: 1.00    Years: 20.00    Types: Cigarettes  . Smokeless tobacco: Never Used     Comment: pt states he is down to 7 cigs per day  . Alcohol use 1.2 oz/week    2 Cans of beer per week     Allergies   Patient has no known allergies.   Review of Systems Review of Systems  Constitutional: Positive for fatigue. Negative for chills and fever.  Respiratory: Positive for shortness of breath. Negative for cough.   Cardiovascular: Negative for chest pain, palpitations and syncope.  Gastrointestinal: Positive for abdominal pain and constipation. Negative for abdominal distention, diarrhea, nausea and vomiting.  Musculoskeletal: Negative for back pain.  Neurological: Negative for syncope, light-headedness and headaches.  All other systems reviewed and are negative.    Physical Exam Updated Vital Signs BP 102/60   Pulse 96   Temp 98.5 F (36.9 C) (Oral)   Resp 19   Ht _0  (1.753 m)   Wt 54.4 kg   SpO2 100%   BMI 17.72 kg/m   Physical Exam  Constitutional: Vital signs are normal. He appears well-developed. He appears cachectic. He appears ill.  Pale appearance  HENT:  Head: Normocephalic and atraumatic.  Mouth/Throat: Oropharynx is clear and moist.  Pale mucous membranes  Eyes: Conjunctivae are normal.  Neck: Neck supple.  Cardiovascular: Normal rate and regular rhythm.   No murmur heard. Pulmonary/Chest: Effort normal and breath sounds normal. No respiratory distress. He has no wheezes. He has no rales.  Abdominal: Soft. He exhibits no distension. There is tenderness (mild LUQ tenderness). There is no rebound and no guarding.  Musculoskeletal: He exhibits no edema.  Neurological: He is alert.  Skin: Skin is warm and dry. There is pallor.  Psychiatric: He has a normal mood and affect.  Nursing note and vitals reviewed.    ED Treatments / Results   Labs (all labs ordered are listed, but only abnormal results are displayed) Labs Reviewed  COMPREHENSIVE METABOLIC PANEL - Abnormal; Notable for the following:       Result Value   Sodium 132 (*)    Potassium 3.4 (*)    Chloride 97 (*)    CO2 20 (*)    BUN <5 (*)    Creatinine, Ser 0.57 (*)    Calcium 8.6 (*)    Albumin 3.2 (*)    Alkaline Phosphatase 161 (*)    All other components within normal limits  CBC - Abnormal; Notable for the following:    RBC 2.06 (*)    Hemoglobin 2.7 (*)    HCT 11.2 (*)    MCV 54.4 (*)    MCH 13.1 (*)    MCHC 24.1 (*)    RDW 23.7 (*)    All other components within normal limits  CBC - Abnormal; Notable for the following:    RBC 2.00 (*)  Hemoglobin 2.7 (*)    HCT 11.1 (*)    MCV 55.5 (*)    MCH 13.5 (*)    MCHC 24.3 (*)    RDW 24.0 (*)    All other components within normal limits  IRON AND TIBC - Abnormal; Notable for the following:    Iron <5 (*)    All other components within normal limits  FERRITIN - Abnormal; Notable for the following:    Ferritin 4 (*)    All other components within normal limits  LIPASE, BLOOD  URINALYSIS, ROUTINE W REFLEX MICROSCOPIC  H. PYLORI ANTIBODY, IGG  OCCULT BLOOD X 1 CARD TO LAB, STOOL  CBC  BASIC METABOLIC PANEL  POC OCCULT BLOOD, ED  PREPARE RBC (CROSSMATCH)  TYPE AND SCREEN    EKG  EKG Interpretation None       Radiology No results found.  Procedures Procedures (including critical care time)  Medications Ordered in ED Medications - No data to display   Initial Impression / Assessment and Plan / ED Course  I have reviewed the triage vital signs and the nursing notes.  Pertinent labs & imaging results that were available during my care of the patient were reviewed by me and considered in my medical decision making (see chart for details).  Clinical Course    Patient is a 62 year old male with history of alcohol abuse, gastric ulcer, prior GI bleed, anemia, interstitial lung  disease who presents with few weeks of worsening fatigue and 1 week of worsening dyspnea on exertion.  Patient found to have a hemoglobin of 2.7 which was repeated with the same result. On exam patient appears pale with pale conjunctiva. Vital signs are stable. Patient currently asymptomatic. Patient has had postprandial upper abdominal pain but has minimal tenderness to palpation in the left upper quadrant currently. Hemoccult was negative. Iron profile ordered.  Patient given 2 units of emergency release blood. Patient also given IV Protonix in the ED.  Medicine is consulted for admission for further evaluation and treatment. Patient will need repeat endoscopy, anemia workup, further blood transfusions.  Patient seen with attending Dr. Leonette Monarch.  Final Clinical Impressions(s) / ED Diagnoses   Final diagnoses:  Severe anemia  DOE (dyspnea on exertion)    New Prescriptions New Prescriptions   No medications on file     Tobie Poet, DO 09/17/16 [place], MD 09/17/16 2337

## 2016-09-18 ENCOUNTER — Encounter (HOSPITAL_COMMUNITY): Payer: Self-pay | Admitting: *Deleted

## 2016-09-18 DIAGNOSIS — F1010 Alcohol abuse, uncomplicated: Secondary | ICD-10-CM

## 2016-09-18 LAB — CBC
HCT: 23.1 % — ABNORMAL LOW (ref 39.0–52.0)
HEMATOCRIT: 18.2 % — AB (ref 39.0–52.0)
Hemoglobin: 5.5 g/dL — CL (ref 13.0–17.0)
Hemoglobin: 7.5 g/dL — ABNORMAL LOW (ref 13.0–17.0)
MCH: 20 pg — ABNORMAL LOW (ref 26.0–34.0)
MCH: 22.7 pg — ABNORMAL LOW (ref 26.0–34.0)
MCHC: 30.2 g/dL (ref 30.0–36.0)
MCHC: 32.5 g/dL (ref 30.0–36.0)
MCV: 66.2 fL — ABNORMAL LOW (ref 78.0–100.0)
MCV: 69.8 fL — ABNORMAL LOW (ref 78.0–100.0)
Platelets: 200 10*3/uL (ref 150–400)
Platelets: 180 10*3/uL (ref 150–400)
RBC: 2.75 MIL/uL — ABNORMAL LOW (ref 4.22–5.81)
RBC: 3.31 MIL/uL — ABNORMAL LOW (ref 4.22–5.81)
RDW: 28.4 % — ABNORMAL HIGH (ref 11.5–15.5)
WBC: 10.9 10*3/uL — ABNORMAL HIGH (ref 4.0–10.5)
WBC: 10.2 10*3/uL (ref 4.0–10.5)

## 2016-09-18 LAB — URINALYSIS, ROUTINE W REFLEX MICROSCOPIC
BILIRUBIN URINE: NEGATIVE
Glucose, UA: NEGATIVE mg/dL
HGB URINE DIPSTICK: NEGATIVE
Ketones, ur: 5 mg/dL — AB
Leukocytes, UA: NEGATIVE
NITRITE: NEGATIVE
PROTEIN: NEGATIVE mg/dL
Specific Gravity, Urine: 1.011 (ref 1.005–1.030)
pH: 5 (ref 5.0–8.0)

## 2016-09-18 LAB — BASIC METABOLIC PANEL
ANION GAP: 12 (ref 5–15)
BUN: 5 mg/dL — ABNORMAL LOW (ref 6–20)
CALCIUM: 7.9 mg/dL — AB (ref 8.9–10.3)
CO2: 21 mmol/L — AB (ref 22–32)
Chloride: 96 mmol/L — ABNORMAL LOW (ref 101–111)
Creatinine, Ser: 0.68 mg/dL (ref 0.61–1.24)
GFR calc Af Amer: >60 mL/min (ref >60)
GFR calc non Af Amer: >60 mL/min (ref >60)
Glucose, Bld: 72 mg/dL (ref 65–99)
Potassium: 3.3 mmol/L — ABNORMAL LOW (ref 3.5–5.1)
SODIUM: 129 mmol/L — AB (ref 135–145)

## 2016-09-18 LAB — RAPID URINE DRUG SCREEN, HOSP PERFORMED
AMPHETAMINES: NOT DETECTED
BARBITURATES: NOT DETECTED
Benzodiazepines: NOT DETECTED
COCAINE: NOT DETECTED
OPIATES: NOT DETECTED
Tetrahydrocannabinol: POSITIVE — AB

## 2016-09-18 LAB — PREPARE RBC (CROSSMATCH)

## 2016-09-18 LAB — MRSA PCR SCREENING: MRSA by PCR: NEGATIVE

## 2016-09-18 LAB — PHOSPHORUS: Phosphorus: 1.9 mg/dL — ABNORMAL LOW (ref 2.5–4.6)

## 2016-09-18 MED ORDER — ACETAMINOPHEN 325 MG PO TABS
650.0000 mg | ORAL_TABLET | Freq: Once | ORAL | Status: AC
  Administered 2016-09-18: 650 mg via ORAL
  Filled 2016-09-18: qty 2

## 2016-09-18 MED ORDER — SODIUM CHLORIDE 0.9 % IV SOLN
Freq: Once | INTRAVENOUS | Status: AC
  Administered 2016-09-18: 08:00:00 via INTRAVENOUS

## 2016-09-18 MED ORDER — FUROSEMIDE 10 MG/ML IJ SOLN
20.0000 mg | Freq: Once | INTRAMUSCULAR | Status: AC
  Administered 2016-09-18: 20 mg via INTRAVENOUS
  Filled 2016-09-18: qty 2

## 2016-09-18 MED ORDER — SODIUM CHLORIDE 0.9 % IV SOLN
25.0000 mg | Freq: Once | INTRAVENOUS | Status: AC
  Administered 2016-09-18: 25 mg via INTRAVENOUS
  Filled 2016-09-18: qty 0.5

## 2016-09-18 MED ORDER — SODIUM CHLORIDE 0.9 % IV SOLN
30.0000 meq | Freq: Once | INTRAVENOUS | Status: AC
  Administered 2016-09-18: 30 meq via INTRAVENOUS
  Filled 2016-09-18: qty 15

## 2016-09-18 MED ORDER — K PHOS MONO-SOD PHOS DI & MONO 155-852-130 MG PO TABS
250.0000 mg | ORAL_TABLET | Freq: Every day | ORAL | Status: DC
  Administered 2016-09-18 – 2016-09-20 (×3): 250 mg via ORAL
  Filled 2016-09-18 (×3): qty 1

## 2016-09-18 MED ORDER — SODIUM CHLORIDE 0.9 % IV SOLN
1000.0000 mg | Freq: Once | INTRAVENOUS | Status: AC
  Administered 2016-09-18: 1000 mg via INTRAVENOUS
  Filled 2016-09-18: qty 20

## 2016-09-18 MED ORDER — SODIUM CHLORIDE 0.9 % IV SOLN: INTRAVENOUS | Status: DC

## 2016-09-18 MED ORDER — DIPHENHYDRAMINE HCL 50 MG/ML IJ SOLN
25.0000 mg | Freq: Once | INTRAMUSCULAR | Status: AC
  Administered 2016-09-18: 25 mg via INTRAVENOUS
  Filled 2016-09-18: qty 1

## 2016-09-18 MED ORDER — DEXTROSE 5 % IV SOLN
10.0000 meq | Freq: Once | INTRAVENOUS | Status: DC
  Filled 2016-09-18: qty 2.27

## 2016-09-18 NOTE — Consult Note (Signed)
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  He reports taking antibiotics through Dr. [facility] for the H. Pylori.  In the ER he was identified to have an HGB of 2.7 g/dL.  With transfusions his HGB increased up to 5.5 g/dL.  An EGD and colonoscopy was last performed by Dr. Michail Sermon with the finding of the gastric ulcer.  His colonoscopy prep was suboptimal, but no overt pathology was noted.  In 2013 he underwent an EGD/Colonoscopy with [facility] with findings of polyps in the colon.    Past Medical History:  Diagnosis Date  . Alcohol abuse   . Anemia due to GI blood loss 09/2011   microcytic.  transfused for Hgb 3.5, MCV in 50s.   . Aortic insufficiency   . Benign neoplasm of colon   . Black stools 08/21/2015  . Cachexia ([place])   . Chest pain 08/21/2015  . Dysphagia   . ED (erectile dysfunction)   . Edema   . GERD (gastroesophageal reflux disease)   . ILD (interstitial lung disease) ([place])   . Iron deficiency anemia, unspecified   . Pulmonary nodule   . Reflux esophagitis   . Smoker   . Tobacco abuse   . Weight loss, abnormal     Past Surgical History:  Procedure Laterality Date  . COLONOSCOPY  10/03/2011   Procedure: COLONOSCOPY;  Surgeon: Scarlette Shorts, MD;  Location: [place];  Service: Endoscopy;  Laterality: N/A;  . COLONOSCOPY WITH PROPOFOL N/A 08/22/2015   Procedure: COLONOSCOPY WITH PROPOFOL;  Surgeon: Wilford Corner, MD;  Location: [facility] ENDOSCOPY;  Service: Endoscopy;  Laterality: N/A;  . ESOPHAGOGASTRODUODENOSCOPY  10/02/2011   Procedure: ESOPHAGOGASTRODUODENOSCOPY (EGD);  Surgeon: Scarlette Shorts, MD;  Location: [facility] ENDOSCOPY;  Service: Endoscopy;  Laterality: N/A;  . ESOPHAGOGASTRODUODENOSCOPY N/A 01/05/2015   Procedure:  ESOPHAGOGASTRODUODENOSCOPY (EGD);  Surgeon: Inda Castle, MD;  Location: [place];  Service: Endoscopy;  Laterality: N/A;  . ESOPHAGOGASTRODUODENOSCOPY (EGD) WITH PROPOFOL N/A 08/22/2015   Procedure: ESOPHAGOGASTRODUODENOSCOPY (EGD) WITH PROPOFOL;  Surgeon: Wilford Corner, MD;  Location: [facility] ENDOSCOPY;  Service: Endoscopy;  Laterality: N/A;    Family History  Problem Relation Age of Onset  . Hypertension Mother   . Diabetes Maternal Grandfather     Social History:  reports that he has been smoking Cigarettes.  He has a 20.00 pack-year smoking history. He has never used smokeless tobacco. He reports that he drinks about 1.2 oz of alcohol per week . He reports that he uses drugs, including Marijuana.  Allergies: No Known Allergies  Medications:  Scheduled: . folic acid  1 mg Oral Daily  . iron dextran (INFED/DEXFERRUM) infusion  25 mg Intravenous Once   Followed by  . iron dextran (INFED/DEXFERRUM) infusion  1,000 mg Intravenous Once  . magnesium oxide  400 mg Oral BID  . multivitamin with minerals  1 tablet Oral Daily  . [START ON 09/21/2016] pantoprazole  40 mg Intravenous Q12H  . potassium chloride (KCL MULTIRUN) 30 mEq in 265 mL IVPB  30 mEq Intravenous Once  . thiamine  100 mg Oral Daily   Or  . thiamine  100 mg Intravenous Daily   Continuous: . pantoprozole (PROTONIX) infusion 8 mg/hr (09/18/16 0700)    Results for orders placed or performed during the  hospital encounter of 09/17/16 (from the past 24 hour(s))  Lipase, blood     Status: None   Collection Time: 09/17/16  5:59 PM  Result Value Ref Range   Lipase 16 11 - 51 U/L  Comprehensive metabolic panel     Status: Abnormal   Collection Time: 09/17/16  5:59 PM  Result Value Ref Range   Sodium 132 (L) 135 - 145 mmol/L   Potassium 3.4 (L) 3.5 - 5.1 mmol/L   Chloride 97 (L) 101 - 111 mmol/L   CO2 20 (L) 22 - 32 mmol/L   Glucose, Bld 82 65 - 99 mg/dL   BUN <5 (L) 6 - 20 mg/dL   Creatinine, Ser 0.57 (L) 0.61 -  1.24 mg/dL   Calcium 8.6 (L) 8.9 - 10.3 mg/dL   Total Protein 7.8 6.5 - 8.1 g/dL   Albumin 3.2 (L) 3.5 - 5.0 g/dL   AST 36 15 - 41 U/L   ALT 20 17 - 63 U/L   Alkaline Phosphatase 161 (H) 38 - 126 U/L   Total Bilirubin 0.4 0.3 - 1.2 mg/dL   GFR calc non Af Amer >60 >60 mL/min   GFR calc Af Amer >60 >60 mL/min   Anion gap 15 5 - 15  CBC     Status: Abnormal   Collection Time: 09/17/16  5:59 PM  Result Value Ref Range   WBC 9.1 4.0 - 10.5 K/uL   RBC 2.06 (L) 4.22 - 5.81 MIL/uL   Hemoglobin 2.7 (LL) 13.0 - 17.0 g/dL   HCT 11.2 (L) 39.0 - 52.0 %   MCV 54.4 (L) 78.0 - 100.0 fL   MCH 13.1 (L) 26.0 - 34.0 pg   MCHC 24.1 (L) 30.0 - 36.0 g/dL   RDW 23.7 (H) 11.5 - 15.5 %   Platelets 259 150 - 400 K/uL  Prepare RBC     Status: None   Collection Time: 09/17/16  7:22 PM  Result Value Ref Range   Order Confirmation ORDER PROCESSED BY BLOOD BANK   POC occult blood, ED     Status: None   Collection Time: 09/17/16  7:36 PM  Result Value Ref Range   Fecal Occult Bld NEGATIVE NEGATIVE  CBC     Status: Abnormal   Collection Time: 09/17/16  8:00 PM  Result Value Ref Range   WBC 9.1 4.0 - 10.5 K/uL   RBC 2.00 (L) 4.22 - 5.81 MIL/uL   Hemoglobin 2.7 (LL) 13.0 - 17.0 g/dL   HCT 11.1 (L) 39.0 - 52.0 %   MCV 55.5 (L) 78.0 - 100.0 fL   MCH 13.5 (L) 26.0 - 34.0 pg   MCHC 24.3 (L) 30.0 - 36.0 g/dL   RDW 24.0 (H) 11.5 - 15.5 %   Platelets 245 150 - 400 K/uL  Type and screen [place]     Status: None (Preliminary result)   Collection Time: 09/17/16  8:00 PM  Result Value Ref Range   ISSUE DATE / TIME [id number]    Blood Product Unit Number [id number]    PRODUCT CODE [id number]    Unit Type and Rh 5100    Blood Product Expiration Date [id number]    ISSUE DATE / TIME [id number]    Blood Product Unit Number [id number]    PRODUCT CODE [id number]    Unit Type and Rh 5100    Blood Product Expiration Date [id number]    ISSUE DATE / TIME [id number]    Blood Product  Unit Number [id number]  PRODUCT CODE [id number]    Unit Type and Rh 9500    Blood Product Expiration Date [id number]   Ferritin (Iron Binding Protein)     Status: Abnormal   Collection Time: 09/17/16  8:00 PM  Result Value Ref Range   Ferritin 4 (L) 24 - 336 ng/mL  Iron and TIBC     Status: Abnormal   Collection Time: 09/17/16  8:10 PM  Result Value Ref Range   Iron <5 (L) 45 - 182 ug/dL   TIBC 405 250 - 450 ug/dL   Saturation Ratios NOT CALCULATED 17.9 - 39.5 %   UIBC NOT CALCULATED ug/dL  Urinalysis, Routine w reflex microscopic     Status: Abnormal   Collection Time: 09/18/16  4:09 AM  Result Value Ref Range   Color, Urine YELLOW YELLOW   APPearance CLEAR CLEAR   Specific Gravity, Urine 1.011 1.005 - 1.030   pH 5.0 5.0 - 8.0   Glucose, UA NEGATIVE NEGATIVE mg/dL   Hgb urine dipstick NEGATIVE NEGATIVE   Bilirubin Urine NEGATIVE NEGATIVE   Ketones, ur 5 (A) NEGATIVE mg/dL   Protein, ur NEGATIVE NEGATIVE mg/dL   Nitrite NEGATIVE NEGATIVE   Leukocytes, UA NEGATIVE NEGATIVE  Urine rapid drug screen (hosp performed)     Status: Abnormal   Collection Time: 09/18/16  4:09 AM  Result Value Ref Range   Opiates NONE DETECTED NONE DETECTED   Cocaine NONE DETECTED NONE DETECTED   Benzodiazepines NONE DETECTED NONE DETECTED   Amphetamines NONE DETECTED NONE DETECTED   Tetrahydrocannabinol POSITIVE (A) NONE DETECTED   Barbiturates NONE DETECTED NONE DETECTED  MRSA PCR Screening     Status: None   Collection Time: 09/18/16  4:49 AM  Result Value Ref Range   MRSA by PCR NEGATIVE NEGATIVE  CBC     Status: Abnormal   Collection Time: 09/18/16  6:49 AM  Result Value Ref Range   WBC 10.9 (H) 4.0 - 10.5 K/uL   RBC 2.75 (L) 4.22 - 5.81 MIL/uL   Hemoglobin 5.5 (LL) 13.0 - 17.0 g/dL   HCT 18.2 (L) 39.0 - 52.0 %   MCV 66.2 (L) 78.0 - 100.0 fL   MCH 20.0 (L) 26.0 - 34.0 pg   MCHC 30.2 30.0 - 36.0 g/dL   RDW NOT CALCULATED 11.5 - 15.5 %   Platelets 200 150 - 400 K/uL  Basic  metabolic panel     Status: Abnormal   Collection Time: 09/18/16  6:49 AM  Result Value Ref Range   Sodium 129 (L) 135 - 145 mmol/L   Potassium 3.3 (L) 3.5 - 5.1 mmol/L   Chloride 96 (L) 101 - 111 mmol/L   CO2 21 (L) 22 - 32 mmol/L   Glucose, Bld 72 65 - 99 mg/dL   BUN 5 (L) 6 - 20 mg/dL   Creatinine, Ser 0.68 0.61 - 1.24 mg/dL   Calcium 7.9 (L) 8.9 - 10.3 mg/dL   GFR calc non Af Amer >60 >60 mL/min   GFR calc Af Amer >60 >60 mL/min   Anion gap 12 5 - 15  Phosphorus     Status: Abnormal   Collection Time: 09/18/16  6:49 AM  Result Value Ref Range   Phosphorus 1.9 (L) 2.5 - 4.6 mg/dL  Prepare RBC     Status: None   Collection Time: 09/18/16  7:56 AM  Result Value Ref Range   Order Confirmation ORDER PROCESSED BY BLOOD BANK      No results found.  ROS:  As stated above in the HPI otherwise negative.  Blood pressure (!) 100/50, pulse 78, temperature 98.5 F (36.9 C), temperature source Oral, resp. rate 16, height 5\' 9"  (1.753 m), weight 46.3 kg (102 lb 1.2 oz), SpO2 100 %.    PE: Gen: NAD, Alert and Oriented HEENT:  [place]/AT, EOMI Neck: Supple, no LAD Lungs: CTA Bilaterally CV: RRR without M/G/R ABM: Soft, NTND, +BS Ext: No C/C/E  Assessment/Plan: 1) Severe anemia. 2) History of gastric ulcer. 3) ETOH abuse.   With his history of the large gastric ulcer a repeat EGD will be pursued.    Plan: 1) EGD tomorrow. 2) Continue to transfuse.  James [name] D 09/18/2016, 11:03 AM

## 2016-09-18 NOTE — Progress Notes (Signed)
Initial Nutrition Assessment  DOCUMENTATION CODES:   Severe malnutrition in context of chronic illness, Underweight  INTERVENTION:    Diet advancement as able per Physician.  If unable to safely advance diet, consider placing Cortrak tube for TF.  NUTRITION DIAGNOSIS:   Malnutrition related to chronic illness as evidenced by severe depletion of muscle mass, severe depletion of body fat.  GOAL:   Patient will meet greater than or equal to 90% of their needs  MONITOR:   Diet advancement, PO intake, I & O's, Labs  REASON FOR ASSESSMENT:   Malnutrition Screening Tool    ASSESSMENT:   61 y.o. male with PMH of ongoing EtOH abuse, H.Pylori ulcer causing GIB, anemia with HGB 3.5 back in Dec 2016. Patient presented to the ED 1/10 with c/o not having BM in 7 days, generalized weakness, fatigue.  Symptoms have been progressively worsening, onset was insidious (over weeks to months).  When he tries to eat food it "gets stuck in my chest".  Discussed patient in ICU rounds and with RN today. Currently NPO due to GI bleeding. History of dysphagia noted. HGB 2.7 on admission, receiving IV iron. Labs reviewed sodium 129, potassium 3.3, phosphorus 1.9 Medications reviewed and include folic acid, IV iron, Mag-Ox, MVI, KCl, potassium phosphate, thiamine. Patient sleeping during RD visit, unable to obtain any nutrition hx. Given ETOH abuse, suspect poor quality PO intake PTA. Partial nutrition-focused physical exam completed. Findings are severe fat depletion and severe muscle depletion.  At risk for refeeding syndrome given severe PCM and ETOH abuse.  Diet Order:  Diet NPO time specified Except for: Sips with Meds, Ice Chips, Other (See Comments)  Skin:  Reviewed, no issues  Last BM:  1/6  Height:   Ht Readings from Last 1 Encounters:  09/18/16 5\' 9"  (1.753 m)    Weight:   Wt Readings from Last 1 Encounters:  09/18/16 102 lb 1.2 oz (46.3 kg)    Ideal Body Weight:  72.7  kg  BMI:  Body mass index is 15.07 kg/m.  Estimated Nutritional Needs:   Kcal:  1600-1800  Protein:  75-85 gm  Fluid:  >/= 1.6 L  EDUCATION NEEDS:   No education needs identified at this time  Molli Barrows, [place], [place], [place] Pager [phone] After Hours Pager [phone]

## 2016-09-18 NOTE — Clinical Social Work Note (Signed)
Clinical Social Work Assessment  Patient Details  Name: James [name] MRN: [id number] Date of Birth: 1956-02-13  Date of referral:  09/18/16               Reason for consult:  Substance Use/ETOH Abuse                Permission sought to share information with:  Family Supports Permission granted to share information::  Yes, Verbal Permission Granted  Name::     Elisabeth Pigeon (Spouse) [phone]  Agency::     Relationship::     Contact Information:     Housing/Transportation Living arrangements for the past 2 months:  Apartment Source of Information:  Patient Patient Interpreter Needed:  None Criminal Activity/Legal Involvement Pertinent to Current Situation/Hospitalization:  No - Comment as needed Significant Relationships:  Warehouse manager, Friend, Spouse, Adult Children, Other Family Members Lives with:  Spouse, Adult Children Do you feel safe going back to the place where you live?  Yes Need for family participation in patient care:  No (Coment)  Care giving concerns: Patient denies any care-giving concerns at this time.    Social Worker assessment / plan:  Patient is a 61 YO male with medical history significant for ongoing ETOH abuse, gastric ulcer, prior GI bleed, anemia, and interstitial lung disease who was admitted for generalized weakness, fatigue, and no BM x7 says and one episode of melena 7 days ago. CSW engaged with Patient at his bedside. CSW introduced self, role of CSW, and discussed Patient's substance abuse concerns. Patient reports that he has been drinking alcohol for "about 17 years". Patient states that he drinks about 6 cans of beer a day. He reports that while he would like to cut back on the amount of alcohol he drinks, he is not yet interested in stopping completely. Patient reports that he does find that he has to drink first thing in the morning "about once a week" however, Patient appears to be minimizing his substance abuse history. Patient reports  that he lives with his wife and son who do not have any concerns on his alcohol use. Patient denies any mental health history or criminal activity. Patient identifies his support system as his family and his "whole apartment complex". Patient denies any blackouts as a result of his drinking. CSW educated Patient on the effects of alcohol and marijuana on his physical health and how his substance abuse related to his current admission.  CSW provided Patient with substance abuse resources including outpatient, intensive outpatient, and residential treatment. CSW provided Patient with emotional support and brief supportive counseling. CSW signing off at this time. Please consult should new need(s) arise.   Employment status:  Retired Forensic scientist:  [employer] PT Recommendations:  Not assessed at this time Information / Referral to community resources:  Residential Substance Abuse Treatment Options, Outpatient Substance Abuse Treatment Options  Patient/Family's Response to care: Patient appreciative of care received at this time. No concerns identified.   Patient/Family's Understanding of and Emotional Response to Diagnosis, Current Treatment, and Prognosis:  Patient able to verbalize strong understanding of diagnosis, current treatment, and prognosis. Patient reports an interest to cutting back on his alcohol usage but reports that he cannot commit to stopping his alcohol use all together. Patient appears to lack insight on the effects of alcohol/marijuana on his physical health and would benefit from further education on the effects of his substance abuse on his health.   Emotional Assessment Appearance:  Appears older than  stated age Attitude/Demeanor/Rapport:   (Cooperative; Engaging) Affect (typically observed):  Stable, Appropriate, Calm Orientation:  Oriented to Self, Oriented to Place, Oriented to  Time, Oriented to Situation Alcohol / Substance use:  Alcohol Use Tobacco Use, Illicit  Drugs Psych involvement (Current and /or in the community):  No (Comment)  Discharge Needs  Concerns to be addressed:  Substance Abuse Concerns Readmission within the last 30 days:    Current discharge risk:  None Barriers to Discharge:  Continued Medical Work up   [employer], LCSW 09/18/2016, 2:43 PM

## 2016-09-18 NOTE — Progress Notes (Signed)
Pt was transferred from [facility] [place]   On stretcher, alert and orientated x4. Pt denies any pain and was orientated to unit and staff.   Call bell and phone within reach- pt educated to call before getting up.   Paulla Fore, RN

## 2016-09-18 NOTE — Progress Notes (Signed)
PROGRESS NOTE        James [name] DETAILS Name: James [name] Age: 61 y.o. Sex: male Date of Birth: [birth year] Admit Date: 09/17/2016 Admitting Physician Etta Quill, DO TZ:[id number] Juanda Crumble, MD  Brief Narrative: James [name] is a 61 y.o. male awake and laying in bed with little energy and appears very thin. PMH significant for EtOH abuse, H. Pylori ulcer causing a GI bleed and anemia.  James [name] was prescribed treatment for GI bleed in the past, but doesn't remember ever taking it.  James [name] for generalized weakness, fatigue and no BM x7days and notes one episode of melena 7 days ago.  Took BC powder for pain.  Received 2 units PRBC in ED for hb 2.7.  Subjective: James [name] of general malaise, weakness and one episode of melena 7 days ago.  Admits to drinking 6 beers a day for many years.   Assessment/Plan: Iron deficiency anemia due to subacute on chronic blood loss likely from upper GI source: Probably recurrent PUD due to untreated H. Pylori. Could also have variceal bleeding-due to long standing hx of ETOH use. Hb improved since yesterday (1/10) likely due to Iron dextran complex infusion and 2 units of PRBC received in the ED. James [name] to be transfused with an additional 1 unit PRBC. Will ask pharmacy to dose IV iron. Follow CBC.  GI consulted for endoscopic evaluation  Alcohol abuse:no withdrawal symptoms currently. Last drink on 1/10. Continue Ativan per CIWA protocol. Continue MVI/Thiamine.James [name] counseled on risks of chronic alcoholism. Social work eval prior to discharge  Hypokalemia: in a setting of ETOH use-replete and recheck  Hypophosphatemia: in a setting of ETOH use-replete and recheck  Hx of Peptic Ulcer Disease: likely due to alcohol abuse and NSAID use. James [name] given protonix and should continue taking on a daily basis when discharged.   DVT Prophylaxis: None given due to bleeding.   Code Status: Full code.  Family Communication: None at  bedside.  Disposition Plan: Remain inpatient-but will plan on Home health on discharge  Antimicrobial agents: Anti-infectives    None      Procedures: None  CONSULTS: GI-Dr Benson [place]  Time spent: 25 minutes-Greater than 50% of this time was spent in counseling, explanation of diagnosis, planning of further management, and coordination of care.  MEDICATIONS: Scheduled Meds: . folic acid  1 mg Oral Daily  . iron dextran (INFED/DEXFERRUM) infusion  25 mg Intravenous Once   Followed by  . iron dextran (INFED/DEXFERRUM) infusion  1,000 mg Intravenous Once  . magnesium oxide  400 mg Oral BID  . multivitamin with minerals  1 tablet Oral Daily  . [START ON 09/21/2016] pantoprazole  40 mg Intravenous Q12H  . potassium chloride (KCL MULTIRUN) 30 mEq in 265 mL IVPB  30 mEq Intravenous Once  . thiamine  100 mg Oral Daily   Or  . thiamine  100 mg Intravenous Daily   Continuous Infusions: . pantoprozole (PROTONIX) infusion 8 mg/hr (09/18/16 0700)   PRN Meds:.LORazepam **OR** LORazepam   PHYSICAL EXAM: Vital signs: Vitals:   09/18/16 0700 09/18/16 0800 09/18/16 0915 09/18/16 0930  BP: (!) 103/54 (!) 116/59 112/61 129/63  Pulse: 83 83    Resp: 15 18    Temp:  98.8 F (37.1 C) 98.8 F (37.1 C) 98.5 F (36.9 C)  TempSrc:  Oral Oral Oral  SpO2: 100% 100%    Weight:  Height:       Filed Weights   09/17/16 1736 09/18/16 0400  Weight: 54.4 kg (120 lb) 46.3 kg (102 lb 1.2 oz)   Body mass index is 15.07 kg/m.   General appearance :Awake, alert, not in any distress. Speech Clear. General Malaise-very cachectic and chronically sick looking Eyes:, pupils equally reactive to light and accomodation,no scleral icterus.Pink conjunctiva HEENT: Atraumatic and Normocephalic Neck: supple, no JVD. No cervical lymphadenopathy. No thyromegaly Resp:Good air entry bilaterally, no added sounds  CVS: S1 S2 regular, no murmurs.  GI: Bowel sounds present, Non tender and not distended with no  gaurding, rigidity or rebound.No organomegaly Extremities: B/L Lower Ext shows no edema, both legs are warm to touch Neurology:  speech clear,Non focal, sensation is grossly intact. Psychiatric: Normal judgment and insight. Alert and oriented x 3. Normal mood. Musculoskeletal:No digital cyanosis Skin:No Rash, warm and dry Wounds:N/A  I have personally reviewed following labs and imaging studies  LABORATORY DATA: CBC:  Recent Labs Lab 09/17/16 1759 09/17/16 2000 09/18/16 0649  WBC 9.1 9.1 10.9*  HGB 2.7* 2.7* 5.5*  HCT 11.2* 11.1* 18.2*  MCV 54.4* 55.5* 66.2*  PLT 259 245 [id number]    Basic Metabolic Panel:  Recent Labs Lab 09/17/16 1759 09/18/16 0649  NA 132* 129*  K 3.4* 3.3*  CL 97* 96*  CO2 20* 21*  GLUCOSE 82 72  BUN <5* 5*  CREATININE 0.57* 0.68  CALCIUM 8.6* 7.9*  PHOS  --  1.9*    GFR: Estimated Creatinine Clearance: 65.9 mL/min (by C-G formula based on SCr of 0.68 mg/dL).  Liver Function Tests:  Recent Labs Lab 09/17/16 1759  AST 36  ALT 20  ALKPHOS 161*  BILITOT 0.4  PROT 7.8  ALBUMIN 3.2*    Recent Labs Lab 09/17/16 1759  LIPASE 16   No results for input(s): AMMONIA in the last 168 hours.  Coagulation Profile: No results for input(s): INR, PROTIME in the last 168 hours.  Cardiac Enzymes: No results for input(s): CKTOTAL, CKMB, CKMBINDEX, TROPONINI in the last 168 hours.  BNP (last 3 results) No results for input(s): PROBNP in the last 8760 hours.  HbA1C: No results for input(s): HGBA1C in the last 72 hours.  CBG: No results for input(s): GLUCAP in the last 168 hours.  Lipid Profile: No results for input(s): CHOL, HDL, LDLCALC, TRIG, CHOLHDL, LDLDIRECT in the last 72 hours.  Thyroid Function Tests: No results for input(s): TSH, T4TOTAL, FREET4, T3FREE, THYROIDAB in the last 72 hours.  Anemia Panel:  Recent Labs  09/17/16 2000 09/17/16 2010  FERRITIN 4*  --   TIBC  --  405  IRON  --  <5*    Urine analysis:     Component Value Date/Time   COLORURINE YELLOW 09/18/2016 0409   APPEARANCEUR CLEAR 09/18/2016 0409   LABSPEC 1.011 09/18/2016 0409   PHURINE 5.0 09/18/2016 0409   GLUCOSEU NEGATIVE 09/18/2016 0409   HGBUR NEGATIVE 09/18/2016 0409   BILIRUBINUR NEGATIVE 09/18/2016 0409   KETONESUR 5 (A) 09/18/2016 0409   PROTEINUR NEGATIVE 09/18/2016 0409   NITRITE NEGATIVE 09/18/2016 0409   LEUKOCYTESUR NEGATIVE 09/18/2016 0409    Sepsis Labs: Lactic Acid, Venous    Component Value Date/Time   LATICACIDVEN 1.3 10/02/2011 0645    MICROBIOLOGY: Recent Results (from the past 240 hour(s))  MRSA PCR Screening     Status: None   Collection Time: 09/18/16  4:49 AM  Result Value Ref Range Status   MRSA by PCR NEGATIVE NEGATIVE Final  Comment:        The GeneXpert MRSA Assay (FDA approved for NASAL specimens only), is one component of a comprehensive MRSA colonization surveillance program. It is not intended to diagnose MRSA infection nor to guide or monitor treatment for MRSA infections.     RADIOLOGY STUDIES/RESULTS: No results found.   LOS: 1 day   Laverle Hobby  [facility]  Pager:[phone]  If 7PM-7AM, please contact night-coverage [email or web address] Password [facility] 09/18/2016, 9:46 AM  Attending MD note  James [name] was seen, examined,treatment plan was discussed with the PA-S.  I have personally reviewed the clinical findings, lab, imaging studies and management of this James [name] in detail. I agree with the documentation, as recorded by the PA-S.   James [name] is a 61 year old male with long-standing history of alcohol abuse, prior history of upper GI bleeding due to a gastric ulcer-noncompliant to medications-continues to use NSAIDs/go to powder-presented with generalized weakness and malaise. James [name] was found to have a hemoglobin of 2.7. James [name] was subsequently transfused 2 units of PRBC with improvement of hemoglobin to 5.5 this morning.  James [name] claims James [name] has not had a bowel movement  in the past 7 days-but does acknowledge having black stools a week back.  On Exam: Gen. exam: Awake, alert, not in any distress. Chronically sick appearing and very cachectic. Chest: Good air entry bilaterally, no rhonchi or rales CVS: S1-S2 regular, no murmurs Abdomen: Soft, nontender and nondistended Neurology: Non-focal Skin: No rash or lesions  Impression Subacute GI bleed Underlying chronic iron deficiency anemia EtOH use-currently with no signs of withdrawal Hypokalemia/hypophosphatemia  Plan Transfuse one additional unit of PRBC Start IV iron Continue PPI GI consulted for endoscopic evaluation Transfer to telemetry  Rest as above  [facility]

## 2016-09-18 NOTE — Progress Notes (Signed)
MEDICATION RELATED CONSULT NOTE - INITIAL   Pharmacy Consult for IV IRON Indication: Anemia  No Known Allergies  Patient Measurements: Height: 5\' 9"  (175.3 cm) Weight: 102 lb 1.2 oz (46.3 kg) IBW/kg (Calculated) : 70.7  Vital Signs: Temp: 97.9 F (36.6 C) (01/11 0400) Temp Source: Oral (01/11 0400) BP: 116/59 (01/11 0800) Pulse Rate: 83 (01/11 0800) Intake/Output from previous day: 01/10 0701 - 01/11 0700 In: 2307.5 [P.O.:120; I.V.:512.5; Blood:1675] Out: 30 [Urine:30] Intake/Output from this shift: No intake/output data recorded.  Labs:  Recent Labs  09/17/16 1759 09/17/16 2000 09/18/16 0649  WBC 9.1 9.1 10.9*  HGB 2.7* 2.7* 5.5*  HCT 11.2* 11.1* 18.2*  PLT 259 245 200  CREATININE 0.57*  --  0.68  ALBUMIN 3.2*  --   --   PROT 7.8  --   --   AST 36  --   --   ALT 20  --   --   ALKPHOS 161*  --   --   BILITOT 0.4  --   --    Estimated Creatinine Clearance: 65.9 mL/min (by C-G formula based on SCr of 0.68 mg/dL).  Assessment: 61 yo male admitted with severe anemia  Iron levels critically low  Plan:  Iron dextran 1 g x 1 (will give 25 mg test dose) Consider repeat dose in 1 week  Levester Fresh, PharmD, BCPS, BCCCP Clinical Pharmacist Clinical phone for 09/18/2016 from 7a-3:30p: [phone] If after 3:30p, please call main pharmacy at: [phone] 09/18/2016 8:42 AM

## 2016-09-19 ENCOUNTER — Encounter: Payer: Self-pay | Admitting: Internal Medicine

## 2016-09-19 ENCOUNTER — Encounter (HOSPITAL_COMMUNITY): Payer: Self-pay | Admitting: Certified Registered Nurse Anesthetist

## 2016-09-19 ENCOUNTER — Encounter (HOSPITAL_COMMUNITY)
Admission: EM | Disposition: A | Discharge status: Home Health | Payer: Self-pay | Source: Home / Self Care | Attending: Internal Medicine

## 2016-09-19 ENCOUNTER — Inpatient Hospital Stay (HOSPITAL_COMMUNITY): Payer: BC Managed Care – PPO | Admitting: Certified Registered Nurse Anesthetist

## 2016-09-19 DIAGNOSIS — E876 Hypokalemia: Secondary | ICD-10-CM

## 2016-09-19 HISTORY — PX: ESOPHAGOGASTRODUODENOSCOPY (EGD) WITH PROPOFOL: SHX5813

## 2016-09-19 HISTORY — PX: GIVENS CAPSULE STUDY: SHX5432

## 2016-09-19 LAB — BASIC METABOLIC PANEL
Anion gap: 8 (ref 5–15)
CALCIUM: 8 mg/dL — AB (ref 8.9–10.3)
CO2: 24 mmol/L (ref 22–32)
CREATININE: 0.55 mg/dL — AB (ref 0.61–1.24)
Chloride: 98 mmol/L — ABNORMAL LOW (ref 101–111)
GFR calc Af Amer: >60 mL/min (ref >60)
GFR calc non Af Amer: >60 mL/min (ref >60)
GLUCOSE: 79 mg/dL (ref 65–99)
Potassium: 3.1 mmol/L — ABNORMAL LOW (ref 3.5–5.1)
Sodium: 130 mmol/L — ABNORMAL LOW (ref 135–145)

## 2016-09-19 LAB — CBC
HEMATOCRIT: 21.5 % — AB (ref 39.0–52.0)
Hemoglobin: 6.8 g/dL — CL (ref 13.0–17.0)
MCH: 21.7 pg — AB (ref 26.0–34.0)
MCHC: 31.6 g/dL (ref 30.0–36.0)
MCV: 68.7 fL — AB (ref 78.0–100.0)
Platelets: 216 10*3/uL (ref 150–400)
RBC: 3.13 MIL/uL — ABNORMAL LOW (ref 4.22–5.81)
RDW: 28.9 % — AB (ref 11.5–15.5)
WBC: 9.9 10*3/uL (ref 4.0–10.5)

## 2016-09-19 LAB — PHOSPHORUS: Phosphorus: 1 mg/dL — CL (ref 2.5–4.6)

## 2016-09-19 LAB — PREPARE RBC (CROSSMATCH)

## 2016-09-19 LAB — MAGNESIUM: Magnesium: 1.9 mg/dL (ref 1.7–2.4)

## 2016-09-19 SURGERY — ESOPHAGOGASTRODUODENOSCOPY (EGD) WITH PROPOFOL
Anesthesia: Monitor Anesthesia Care

## 2016-09-19 SURGERY — IMAGING PROCEDURE, GI TRACT, INTRALUMINAL, VIA CAPSULE
Anesthesia: LOCAL

## 2016-09-19 MED ORDER — PROPOFOL 10 MG/ML IV BOLUS
INTRAVENOUS | Status: DC | PRN
  Administered 2016-09-19 (×7): 20 mg via INTRAVENOUS

## 2016-09-19 MED ORDER — LACTATED RINGERS IV SOLN
INTRAVENOUS | Status: DC | PRN
  Administered 2016-09-19: 13:00:00 via INTRAVENOUS

## 2016-09-19 MED ORDER — SODIUM PHOSPHATES 45 MMOLE/15ML IV SOLN
20.0000 mmol | Freq: Once | INTRAVENOUS | Status: AC
  Administered 2016-09-19: 20 mmol via INTRAVENOUS
  Filled 2016-09-19 (×2): qty 6.67

## 2016-09-19 MED ORDER — SODIUM CHLORIDE 0.9 % IV SOLN: INTRAVENOUS | Status: DC

## 2016-09-19 MED ORDER — ACETAMINOPHEN 325 MG PO TABS
650.0000 mg | ORAL_TABLET | Freq: Once | ORAL | Status: DC
  Filled 2016-09-19 (×2): qty 2

## 2016-09-19 MED ORDER — PROPOFOL 500 MG/50ML IV EMUL
INTRAVENOUS | Status: DC | PRN
  Administered 2016-09-19: 150 ug/kg/min via INTRAVENOUS

## 2016-09-19 MED ORDER — MIDAZOLAM HCL 2 MG/2ML IJ SOLN
INTRAMUSCULAR | Status: DC | PRN
  Administered 2016-09-19: 2 mg via INTRAVENOUS

## 2016-09-19 MED ORDER — POTASSIUM PHOSPHATES 15 MMOLE/5ML IV SOLN
30.0000 mmol | Freq: Once | INTRAVENOUS | Status: AC
  Administered 2016-09-19: 30 mmol via INTRAVENOUS
  Filled 2016-09-19: qty 10

## 2016-09-19 MED ORDER — DIPHENHYDRAMINE HCL 50 MG/ML IJ SOLN
25.0000 mg | Freq: Once | INTRAMUSCULAR | Status: DC
  Filled 2016-09-19 (×2): qty 1

## 2016-09-19 MED ORDER — SODIUM CHLORIDE 0.9 % IV SOLN: Freq: Once | INTRAVENOUS | Status: DC

## 2016-09-19 MED ORDER — LIDOCAINE 2% (20 MG/ML) 5 ML SYRINGE
INTRAMUSCULAR | Status: DC | PRN
  Administered 2016-09-19: 50 mg via INTRAVENOUS

## 2016-09-19 MED ORDER — MAGNESIUM SULFATE 2 GM/50ML IV SOLN
2.0000 g | Freq: Once | INTRAVENOUS | Status: AC
  Administered 2016-09-19: 2 g via INTRAVENOUS
  Filled 2016-09-19: qty 50

## 2016-09-19 MED ORDER — FUROSEMIDE 10 MG/ML IJ SOLN
20.0000 mg | Freq: Once | INTRAMUSCULAR | Status: DC
  Filled 2016-09-19: qty 2

## 2016-09-19 SURGICAL SUPPLY — 1 items: TOWEL COTTON PACK 4EA (MISCELLANEOUS) ×4 IMPLANT

## 2016-09-19 NOTE — Anesthesia Preprocedure Evaluation (Signed)
Anesthesia Evaluation  Patient identified by MRN, date of birth, ID band Patient awake    Reviewed: Allergy & Precautions, NPO status , Patient's Chart, lab work & pertinent test results  Airway Mallampati: II   Neck ROM: full    Dental  (+) Dental Advisory Given, Poor Dentition   Pulmonary shortness of breath, Current Smoker,    breath sounds clear to auscultation       Cardiovascular negative cardio ROS   Rhythm:irregular Rate:Tachycardia     Neuro/Psych    GI/Hepatic GERD  ,(+)     substance abuse  alcohol use,   Endo/Other    Renal/GU      Musculoskeletal   Abdominal   Peds  Hematology  (+) anemia ,   Anesthesia Other Findings   Reproductive/Obstetrics                             Anesthesia Physical  Anesthesia Plan  ASA: III  Anesthesia Plan: MAC   Post-op Pain Management:    Induction: Intravenous  Airway Management Planned: Nasal Cannula  Additional Equipment:   Intra-op Plan:   Post-operative Plan:   Informed Consent: I have reviewed the patients History and Physical, chart, labs and discussed the procedure including the risks, benefits and alternatives for the proposed anesthesia with the patient or authorized representative who has indicated his/her understanding and acceptance.   Dental advisory given  Plan Discussed with: Anesthesiologist and Surgeon  Anesthesia Plan Comments:         Anesthesia Quick Evaluation

## 2016-09-19 NOTE — Transfer of Care (Signed)
Immediate Anesthesia Transfer of Care Note  Patient: James [name]  Procedure(s) Performed: Procedure(s): ESOPHAGOGASTRODUODENOSCOPY (EGD) WITH PROPOFOL (N/A)  Patient Location: Endoscopy Unit  Anesthesia Type:MAC  Level of Consciousness: awake, alert  and oriented  Airway & Oxygen Therapy: Patient Spontanous Breathing and Patient connected to nasal cannula oxygen  Post-op Assessment: Report given to RN and Post -op Vital signs reviewed and stable  Post vital signs: Reviewed and stable  Last Vitals:  Vitals:   09/19/16 1235 09/19/16 1250  BP: (!) 112/46 (!) 103/46  Pulse: 79 77  Resp: 15 16  Temp: 37 C 37.1 C    Last Pain:  Vitals:   09/19/16 1250  TempSrc: Oral  PainSc:          Complications: No apparent anesthesia complications

## 2016-09-19 NOTE — H&P (View-Only) (Signed)
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  He reports taking antibiotics through Dr. Redmond [facility] for the H. Pylori.  In the ER he was identified to have an HGB of 2.7 g/dL.  With transfusions his HGB increased up to 5.5 g/dL.  An EGD and colonoscopy was last performed by Dr. Michail Sermon with the finding of the gastric ulcer.  His colonoscopy prep was suboptimal, but no overt pathology was noted.  In 2013 he underwent an EGD/Colonoscopy with [facility] with findings of polyps in the colon.    Past Medical History:  Diagnosis Date  . Alcohol abuse   . Anemia due to GI blood loss 09/2011   microcytic.  transfused for Hgb 3.5, MCV in 50s.   . Aortic insufficiency   . Benign neoplasm of colon   . Black stools 08/21/2015  . Cachexia ([place])   . Chest pain 08/21/2015  . Dysphagia   . ED (erectile dysfunction)   . Edema   . GERD (gastroesophageal reflux disease)   . ILD (interstitial lung disease) ([place])   . Iron deficiency anemia, unspecified   . Pulmonary nodule   . Reflux esophagitis   . Smoker   . Tobacco abuse   . Weight loss, abnormal     Past Surgical History:  Procedure Laterality Date  . COLONOSCOPY  10/03/2011   Procedure: COLONOSCOPY;  Surgeon: Scarlette Shorts, MD;  Location: [facility];  Service: Endoscopy;  Laterality: N/A;  . COLONOSCOPY WITH PROPOFOL N/A 08/22/2015   Procedure: COLONOSCOPY WITH PROPOFOL;  Surgeon: Wilford Corner, MD;  Location: [facility] ENDOSCOPY;  Service: Endoscopy;  Laterality: N/A;  . ESOPHAGOGASTRODUODENOSCOPY  10/02/2011   Procedure: ESOPHAGOGASTRODUODENOSCOPY (EGD);  Surgeon: Scarlette Shorts, MD;  Location[facility] ENDOSCOPY;  Service: Endoscopy;  Laterality: N/A;  . ESOPHAGOGASTRODUODENOSCOPY N/A 01/05/2015   Procedure:  ESOPHAGOGASTRODUODENOSCOPY (EGD);  Surgeon: Inda Castle, MD;  Location: [facility];  Service: Endoscopy;  Laterality: N/A;  . ESOPHAGOGASTRODUODENOSCOPY (EGD) WITH PROPOFOL N/A 08/22/2015   Procedure: ESOPHAGOGASTRODUODENOSCOPY (EGD) WITH PROPOFOL;  Surgeon: Wilford Corner, MD;  Location: [facility] ENDOSCOPY;  Service: Endoscopy;  Laterality: N/A;    Family History  Problem Relation Age of Onset  . Hypertension Mother   . Diabetes Maternal Grandfather     Social History:  reports that he has been smoking Cigarettes.  He has a 20.00 pack-year smoking history. He has never used smokeless tobacco. He reports that he drinks about 1.2 oz of alcohol per week . He reports that he uses drugs, including Marijuana.  Allergies: No Known Allergies  Medications:  Scheduled: . folic acid  1 mg Oral Daily  . iron dextran (INFED/DEXFERRUM) infusion  25 mg Intravenous Once   Followed by  . iron dextran (INFED/DEXFERRUM) infusion  1,000 mg Intravenous Once  . magnesium oxide  400 mg Oral BID  . multivitamin with minerals  1 tablet Oral Daily  . [START ON 09/21/2016] pantoprazole  40 mg Intravenous Q12H  . potassium chloride (KCL MULTIRUN) 30 mEq in 265 mL IVPB  30 mEq Intravenous Once  . thiamine  100 mg Oral Daily   Or  . thiamine  100 mg Intravenous Daily   Continuous: . pantoprozole (PROTONIX) infusion 8 mg/hr (09/18/16 0700)    Results for orders placed or performed during the  hospital encounter of 09/17/16 (from the past 24 hour(s))  Lipase, blood     Status: None   Collection Time: 09/17/16  5:59 PM  Result Value Ref Range   Lipase 16 11 - 51 U/L  Comprehensive metabolic panel     Status: Abnormal   Collection Time: 09/17/16  5:59 PM  Result Value Ref Range   Sodium 132 (L) 135 - 145 mmol/L   Potassium 3.4 (L) 3.5 - 5.1 mmol/L   Chloride 97 (L) 101 - 111 mmol/L   CO2 20 (L) 22 - 32 mmol/L   Glucose, Bld 82 65 - 99 mg/dL   BUN <5 (L) 6 - 20 mg/dL   Creatinine, Ser 0.57 (L) 0.61 -  1.24 mg/dL   Calcium 8.6 (L) 8.9 - 10.3 mg/dL   Total Protein 7.8 6.5 - 8.1 g/dL   Albumin 3.2 (L) 3.5 - 5.0 g/dL   AST 36 15 - 41 U/L   ALT 20 17 - 63 U/L   Alkaline Phosphatase 161 (H) 38 - 126 U/L   Total Bilirubin 0.4 0.3 - 1.2 mg/dL   GFR calc non Af Amer >60 >60 mL/min   GFR calc Af Amer >60 >60 mL/min   Anion gap 15 5 - 15  CBC     Status: Abnormal   Collection Time: 09/17/16  5:59 PM  Result Value Ref Range   WBC 9.1 4.0 - 10.5 K/uL   RBC 2.06 (L) 4.22 - 5.81 MIL/uL   Hemoglobin 2.7 (LL) 13.0 - 17.0 g/dL   HCT 11.2 (L) 39.0 - 52.0 %   MCV 54.4 (L) 78.0 - 100.0 fL   MCH 13.1 (L) 26.0 - 34.0 pg   MCHC 24.1 (L) 30.0 - 36.0 g/dL   RDW 23.7 (H) 11.5 - 15.5 %   Platelets 259 150 - 400 K/uL  Prepare RBC     Status: None   Collection Time: 09/17/16  7:22 PM  Result Value Ref Range   Order Confirmation ORDER PROCESSED BY BLOOD BANK   POC occult blood, ED     Status: None   Collection Time: 09/17/16  7:36 PM  Result Value Ref Range   Fecal Occult Bld NEGATIVE NEGATIVE  CBC     Status: Abnormal   Collection Time: 09/17/16  8:00 PM  Result Value Ref Range   WBC 9.1 4.0 - 10.5 K/uL   RBC 2.00 (L) 4.22 - 5.81 MIL/uL   Hemoglobin 2.7 (LL) 13.0 - 17.0 g/dL   HCT 11.1 (L) 39.0 - 52.0 %   MCV 55.5 (L) 78.0 - 100.0 fL   MCH 13.5 (L) 26.0 - 34.0 pg   MCHC 24.3 (L) 30.0 - 36.0 g/dL   RDW 24.0 (H) 11.5 - 15.5 %   Platelets 245 150 - 400 K/uL  Type and screen [place]     Status: None (Preliminary result)   Collection Time: 09/17/16  8:00 PM  Result Value Ref Range   ISSUE DATE / TIME [id number]    Blood Product Unit Number [id number]    PRODUCT CODE [id number]    Unit Type and Rh 5100    Blood Product Expiration Date [id number]    ISSUE DATE / TIME [id number]    Blood Product Unit Number [id number]    PRODUCT CODE [id number]    Unit Type and Rh 5100    Blood Product Expiration Date [id number]    ISSUE DATE / TIME [id number]    Blood Product  Unit Number [id number]  PRODUCT CODE E0336V00    Unit Type and Rh 9500    Blood Product Expiration Date [id number]   Ferritin (Iron Binding Protein)     Status: Abnormal   Collection Time: 09/17/16  8:00 PM  Result Value Ref Range   Ferritin 4 (L) 24 - 336 ng/mL  Iron and TIBC     Status: Abnormal   Collection Time: 09/17/16  8:10 PM  Result Value Ref Range   Iron <5 (L) 45 - 182 ug/dL   TIBC 405 250 - 450 ug/dL   Saturation Ratios NOT CALCULATED 17.9 - 39.5 %   UIBC NOT CALCULATED ug/dL  Urinalysis, Routine w reflex microscopic     Status: Abnormal   Collection Time: 09/18/16  4:09 AM  Result Value Ref Range   Color, Urine YELLOW YELLOW   APPearance CLEAR CLEAR   Specific Gravity, Urine 1.011 1.005 - 1.030   pH 5.0 5.0 - 8.0   Glucose, UA NEGATIVE NEGATIVE mg/dL   Hgb urine dipstick NEGATIVE NEGATIVE   Bilirubin Urine NEGATIVE NEGATIVE   Ketones, ur 5 (A) NEGATIVE mg/dL   Protein, ur NEGATIVE NEGATIVE mg/dL   Nitrite NEGATIVE NEGATIVE   Leukocytes, UA NEGATIVE NEGATIVE  Urine rapid drug screen (hosp performed)     Status: Abnormal   Collection Time: 09/18/16  4:09 AM  Result Value Ref Range   Opiates NONE DETECTED NONE DETECTED   Cocaine NONE DETECTED NONE DETECTED   Benzodiazepines NONE DETECTED NONE DETECTED   Amphetamines NONE DETECTED NONE DETECTED   Tetrahydrocannabinol POSITIVE (A) NONE DETECTED   Barbiturates NONE DETECTED NONE DETECTED  MRSA PCR Screening     Status: None   Collection Time: 09/18/16  4:49 AM  Result Value Ref Range   MRSA by PCR NEGATIVE NEGATIVE  CBC     Status: Abnormal   Collection Time: 09/18/16  6:49 AM  Result Value Ref Range   WBC 10.9 (H) 4.0 - 10.5 K/uL   RBC 2.75 (L) 4.22 - 5.81 MIL/uL   Hemoglobin 5.5 (LL) 13.0 - 17.0 g/dL   HCT 18.2 (L) 39.0 - 52.0 %   MCV 66.2 (L) 78.0 - 100.0 fL   MCH 20.0 (L) 26.0 - 34.0 pg   MCHC 30.2 30.0 - 36.0 g/dL   RDW NOT CALCULATED 11.5 - 15.5 %   Platelets 200 150 - 400 K/uL  Basic  metabolic panel     Status: Abnormal   Collection Time: 09/18/16  6:49 AM  Result Value Ref Range   Sodium 129 (L) 135 - 145 mmol/L   Potassium 3.3 (L) 3.5 - 5.1 mmol/L   Chloride 96 (L) 101 - 111 mmol/L   CO2 21 (L) 22 - 32 mmol/L   Glucose, Bld 72 65 - 99 mg/dL   BUN 5 (L) 6 - 20 mg/dL   Creatinine, Ser 0.68 0.61 - 1.24 mg/dL   Calcium 7.9 (L) 8.9 - 10.3 mg/dL   GFR calc non Af Amer >60 >60 mL/min   GFR calc Af Amer >60 >60 mL/min   Anion gap 12 5 - 15  Phosphorus     Status: Abnormal   Collection Time: 09/18/16  6:49 AM  Result Value Ref Range   Phosphorus 1.9 (L) 2.5 - 4.6 mg/dL  Prepare RBC     Status: None   Collection Time: 09/18/16  7:56 AM  Result Value Ref Range   Order Confirmation ORDER PROCESSED BY BLOOD BANK      No results found.  ROS:  As stated above in the HPI otherwise negative.  Blood pressure (!) 100/50, pulse 78, temperature 98.5 F (36.9 C), temperature source Oral, resp. rate 16, height 5\' 9"  (1.753 m), weight 46.3 kg (102 lb 1.2 oz), SpO2 100 %.    PE: Gen: NAD, Alert and Oriented HEENT:  [place]/AT, EOMI Neck: Supple, no LAD Lungs: CTA Bilaterally CV: RRR without M/G/R ABM: Soft, NTND, +BS Ext: No C/C/E  Assessment/Plan: 1) Severe anemia. 2) History of gastric ulcer. 3) ETOH abuse.   With his history of the large gastric ulcer a repeat EGD will be pursued.    Plan: 1) EGD tomorrow. 2) Continue to transfuse.  Toddy Boyd D 09/18/2016, 11:03 AM

## 2016-09-19 NOTE — Progress Notes (Signed)
PROGRESS NOTE        PATIENT DETAILS Name: NAME SEESE Age: 61 y.o. Sex: male Date of Birth: 01-23-1956 Admit Date: 09/17/2016 Admitting Physician Etta Quill, DO [id number] Juanda Crumble, MD  Brief Narrative: Patient is a 61 y.o. male awake and laying in bed with improved energy.  He is alert and oriented and shows no signs of EtOH withdrawal.  PT's last drink was on 1/10.  PMH significant for EtOH abuse, H. Pylori ulcer causing a GI bleed and anemia.  PT had a bowel movement last night for the first time in 7 days, but reported it to be black.  Received a total of 3 units PRBC. Current hb 6.8 and will receive an additional unit today. Scheduled for EGD this afternoon to assess for bleeding.     Subjective: PT states he is feeling better and is eager to eat pending EGD results.  PT had one BM last night and said it appeared black.   Assessment/Plan:  Iron deficiency anemia due to subacute on chronic blood loss likely from upper GI source: Probably recurrent PUD due to untreated H. Pylori. Could also have variceal bleeding-due to long standing hx of ETOH use. Hb improved since (1/10) likely due to Iron dextran complex infusion and 3 units of PRBC. Pt to be transfused with an additional 1 unit PRBC. PT received one dose IV Iron on 1/11.  EGD scheduled for this afternoon.   Alcohol abuse: no withdrawal symptoms currently. Last drink on 1/10. Continue MVI/Thiamine.Pt counseled on risks of chronic alcoholism. Social work eval completed on 1/11. PT reports that while he would like to cut back on the amount of alcohol he drinks, he is not yet interested in stopping completely.    Hypokalemia: in a setting of ETOH use-replete and recheck  Hypophosphatemia: in a setting of ETOH use-replete and recheck  Hx of Peptic Ulcer Disease: likely due to alcohol abuse and NSAID use. Pt given protonix and should continue taking on a daily basis when discharged.  DVT  Prophylaxis: None given due to bleeding.   Code Status: Full code.  Family Communication: None at bedside.   Disposition Plan: Remain inpatient- pending EGD results, may be discharged today pending GI approval.   Antimicrobial agents: Anti-infectives    None     Procedures: EGD scheduled for today.  CONSULTS: GI  Time spent: 25 minutes - Greater than 50% of this time was spent in counseling, explanation of diagnosis, planning of further management, and coordination of care.  MEDICATIONS: Scheduled Meds: . folic acid  1 mg Oral Daily  . magnesium oxide  400 mg Oral BID  . magnesium sulfate 1 - 4 g bolus IVPB  2 g Intravenous Once  . multivitamin with minerals  1 tablet Oral Daily  . [START ON 09/21/2016] pantoprazole  40 mg Intravenous Q12H  . phosphorus  250 mg Oral Daily  . potassium phosphate IVPB (mmol)  30 mmol Intravenous Once  . sodium phosphate  Dextrose 5% IVPB  20 mmol Intravenous Once  . thiamine  100 mg Oral Daily   Or  . thiamine  100 mg Intravenous Daily   Continuous Infusions: . sodium chloride    . pantoprozole (PROTONIX) infusion 8 mg/hr (09/18/16 2018)   PRN Meds:.LORazepam **OR** LORazepam   PHYSICAL EXAM: Vital signs: Vitals:   09/18/16 1531 09/18/16 1800 09/18/16  2125 09/19/16 0616  BP: (!) 112/55 (!) 109/52 (!) 100/54 (!) 100/54  Pulse: 79 89 73 77  Resp: 16  16 17   Temp: 98.3 F (36.8 C)  97.9 F (36.6 C) 98 F (36.7 C)  TempSrc: Oral  Oral Oral  SpO2: 100%  97% 98%  Weight: 47 kg (103 lb 9.6 oz)  47 kg (103 lb 9.9 oz)   Height: 5\' 9"  (1.753 m)      Filed Weights   09/18/16 0400 09/18/16 1531 09/18/16 2125  Weight: 46.3 kg (102 lb 1.2 oz) 47 kg (103 lb 9.6 oz) 47 kg (103 lb 9.9 oz)   Body mass index is 15.3 kg/m.   General appearance :Awake, alert, not in any distress. Speech Clear. Mild malaise, very cachectic and chronically sick looking.  Eyes:, pupils equally reactive to light and accomodation,no scleral icterus.Pink  conjunctiva HEENT: Atraumatic and Normocephalic Neck: supple, no JVD. No cervical lymphadenopathy. No thyromegaly Resp:Good air entry bilaterally, no added sounds  CVS: S1 S2 regular, no murmurs.  GI: Bowel sounds present, Non tender and not distended with no gaurding, rigidity or rebound.No organomegaly Extremities: B/L Lower Ext shows no edema, both legs are warm to touch Neurology:  speech clear,Non focal, sensation is grossly intact. Psychiatric: Normal judgment and insight. Alert and oriented x 3. Normal mood. Musculoskeletal:No digital cyanosis Skin:No Rash, warm and dry Wounds:N/A  I have personally reviewed following labs and imaging studies  LABORATORY DATA: CBC:  Recent Labs Lab 09/17/16 1759 09/17/16 2000 09/18/16 0649 09/18/16 1439 09/19/16 0657  WBC 9.1 9.1 10.9* 10.2 9.9  HGB 2.7* 2.7* 5.5* 7.5* 6.8*  HCT 11.2* 11.1* 18.2* 23.1* 21.5*  MCV 54.4* 55.5* 66.2* 69.8* 68.7*  PLT 259 245 200 180 PENDING    Basic Metabolic Panel:  Recent Labs Lab 09/17/16 1759 09/18/16 0649 09/19/16 0657  NA 132* 129* 130*  K 3.4* 3.3* 3.1*  CL 97* 96* 98*  CO2 20* 21* 24  GLUCOSE 82 72 79  BUN <5* 5* <5*  CREATININE 0.57* 0.68 0.55*  CALCIUM 8.6* 7.9* 8.0*  MG  --   --  1.9  PHOS  --  1.9* 1.0*    GFR: Estimated Creatinine Clearance: 66.9 mL/min (by C-G formula based on SCr of 0.55 mg/dL (L)).  Liver Function Tests:  Recent Labs Lab 09/17/16 1759  AST 36  ALT 20  ALKPHOS 161*  BILITOT 0.4  PROT 7.8  ALBUMIN 3.2*    Recent Labs Lab 09/17/16 1759  LIPASE 16   No results for input(s): AMMONIA in the last 168 hours.  Coagulation Profile: No results for input(s): INR, PROTIME in the last 168 hours.  Cardiac Enzymes: No results for input(s): CKTOTAL, CKMB, CKMBINDEX, TROPONINI in the last 168 hours.  BNP (last 3 results) No results for input(s): PROBNP in the last 8760 hours.  HbA1C: No results for input(s): HGBA1C in the last 72 hours.  CBG: No  results for input(s): GLUCAP in the last 168 hours.  Lipid Profile: No results for input(s): CHOL, HDL, LDLCALC, TRIG, CHOLHDL, LDLDIRECT in the last 72 hours.  Thyroid Function Tests: No results for input(s): TSH, T4TOTAL, FREET4, T3FREE, THYROIDAB in the last 72 hours.  Anemia Panel:  Recent Labs  09/17/16 2000 09/17/16 2010  FERRITIN 4*  --   TIBC  --  405  IRON  --  <5*    Urine analysis:    Component Value Date/Time   COLORURINE YELLOW 09/18/2016 [place] 09/18/2016 0409  LABSPEC 1.011 09/18/2016 0409   PHURINE 5.0 09/18/2016 0409   GLUCOSEU NEGATIVE 09/18/2016 0409   HGBUR NEGATIVE 09/18/2016 0409   BILIRUBINUR NEGATIVE 09/18/2016 0409   KETONESUR 5 (A) 09/18/2016 0409   PROTEINUR NEGATIVE 09/18/2016 0409   NITRITE NEGATIVE 09/18/2016 0409   LEUKOCYTESUR NEGATIVE 09/18/2016 0409    Sepsis Labs: Lactic Acid, Venous    Component Value Date/Time   LATICACIDVEN 1.3 10/02/2011 0645    MICROBIOLOGY: Recent Results (from the past 240 hour(s))  MRSA PCR Screening     Status: None   Collection Time: 09/18/16  4:49 AM  Result Value Ref Range Status   MRSA by PCR NEGATIVE NEGATIVE Final    Comment:        The GeneXpert MRSA Assay (FDA approved for NASAL specimens only), is one component of a comprehensive MRSA colonization surveillance program. It is not intended to diagnose MRSA infection nor to guide or monitor treatment for MRSA infections.     RADIOLOGY STUDIES/RESULTS: No results found.   LOS: 2 days   Marcie Bal, PA-S  [facility]   If 7PM-7AM, please contact night-coverage [email or web address] Password [facility] 09/19/2016, 9:12 AM  Attending MD note  Patient was seen, examined,treatment plan was discussed with the PA-S.  I have personally reviewed the clinical findings, lab, imaging studies and management of this patient in detail. I agree with the documentation, as recorded by the PA-S.   Patient claims that he had a BM  yesterday and is still lack in color. Awake and alert. No tremors  On Exam: Gen. exam: Awake, alert, not in any distress Chest: Good air entry bilaterally, no rhonchi or rales CVS: S1-S2 regular, no murmurs Abdomen: Soft, nontender and nondistended Neurology: Non-focal Skin: No rash or lesions  Recent GI Bleed with Fe def anemia ETOH Abuse  Plan Given ongoing black stools-will transfuse 1 unit of PRBC (i.e 4th unit of PRBC so far)  Replace K/PO4 EGD later today  Rest as above  [facility]

## 2016-09-19 NOTE — Interval H&P Note (Signed)
History and Physical Interval Note:  09/19/2016 12:56 PM  James [name]  has presented today for surgery, with the diagnosis of Anemia  The various methods of treatment have been discussed with the patient and family. After consideration of risks, benefits and other options for treatment, the patient has consented to  Procedure(s): ESOPHAGOGASTRODUODENOSCOPY (EGD) WITH PROPOFOL (N/A) as a surgical intervention .  The patient's history has been reviewed, patient examined, no change in status, stable for surgery.  I have reviewed the patient's chart and labs.  Questions were answered to the patient's satisfaction.     [name] D

## 2016-09-19 NOTE — Anesthesia Postprocedure Evaluation (Signed)
Anesthesia Post Note  Patient: James [name]  Procedure(s) Performed: Procedure(s) (LRB): ESOPHAGOGASTRODUODENOSCOPY (EGD) WITH PROPOFOL (N/A)  Patient location during evaluation: Endoscopy Anesthesia Type: MAC Level of consciousness: awake and alert Pain management: pain level controlled Vital Signs Assessment: post-procedure vital signs reviewed and stable Respiratory status: spontaneous breathing, nonlabored ventilation, respiratory function stable and patient connected to nasal cannula oxygen Cardiovascular status: stable and blood pressure returned to baseline Anesthetic complications: no       Last Vitals:  Vitals:   09/19/16 1250 09/19/16 1325  BP: (!) 103/46 (!) 99/56  Pulse: 77 87  Resp: 16 19  Temp: 37.1 C 36.8 C    Last Pain:  Vitals:   09/19/16 1325  TempSrc: Oral  PainSc:                  [name]

## 2016-09-19 NOTE — Progress Notes (Signed)
Givens capsule ingested at [id number]; instructions given and understanding verbalized by patient and family member

## 2016-09-19 NOTE — Op Note (Signed)
[facility] Patient Name: James [name] Procedure Date : 09/19/2016 MRN: [id number] Attending MD: Carol Ada , MD Date of Birth: [birth year] CSN: [id number] Age: 61 Admit Type: Inpatient Procedure:                Upper GI endoscopy Indications:              Iron deficiency anemia Providers:                Carol Ada, MD, Cleda Daub, RN, Despina Pole                            Tech, Technician, Dellie Catholic, CRNA Referring MD:              Medicines:                Propofol per Anesthesia Complications:            No immediate complications. Estimated Blood Loss:     Estimated blood loss: none. Procedure:                Pre-Anesthesia Assessment:                           - Prior to the procedure, a History and Physical                            was performed, and patient medications and                            allergies were reviewed. The patient's tolerance of                            previous anesthesia was also reviewed. The risks                            and benefits of the procedure and the sedation                            options and risks were discussed with the patient.                            All questions were answered, and informed consent                            was obtained. Prior Anticoagulants: The patient has                            taken no previous anticoagulant or antiplatelet                            agents. ASA Grade Assessment: III - A patient with                            severe systemic disease. After reviewing the risks  and benefits, the patient was deemed in                            satisfactory condition to undergo the procedure.                           - Sedation was administered by an anesthesia                            professional. Deep sedation was attained.                           After obtaining informed consent, the endoscope was                            passed under  direct vision. Throughout the                            procedure, the patient's blood pressure, pulse, and                            oxygen saturations were monitored continuously. The                            Endoscope was introduced through the mouth, and                            advanced to the second part of duodenum. The upper                            GI endoscopy was accomplished without difficulty.                            The patient tolerated the procedure well. Scope In: Scope Out: Findings:      The esophagus was normal.      Mild portal hypertensive gastropathy was found in the entire examined       stomach.      The examined duodenum was normal.      There was no evidence of bleeding. The prior ulcer was healed as noted       by the scar. There was clear evidence of portal HTN gastropathy without       esophageal or gastric varices. Impression:               - Normal esophagus.                           - Portal hypertensive gastropathy.                           - Normal examined duodenum.                           - No specimens collected. Moderate Sedation:      None Recommendation:           - Return patient to hospital ward  for ongoing care.                           - NPO.                           - Continue present medications.                           - To visualize the small bowel, perform video                            capsule endoscopy today. Procedure Code(s):        --- Professional ---                           [phone], Esophagogastroduodenoscopy, flexible,                            transoral; diagnostic, including collection of                            specimen(s) by brushing or washing, when performed                            (separate procedure) Diagnosis Code(s):        --- Professional ---                           K76.6, Portal hypertension                           K31.89, Other diseases of stomach and duodenum                            D50.9, Iron deficiency anemia, unspecified CPT copyright 2016 American [employer] Association. All rights reserved. The codes documented in this report are preliminary and upon coder review may  be revised to meet current compliance requirements. Carol Ada, MD Carol Ada, MD 09/19/2016 1:24:14 PM This report has been signed electronically. Number of Addenda: 0

## 2016-09-20 DIAGNOSIS — D500 Iron deficiency anemia secondary to blood loss (chronic): Secondary | ICD-10-CM

## 2016-09-20 DIAGNOSIS — K2921 Alcoholic gastritis with bleeding: Secondary | ICD-10-CM

## 2016-09-20 LAB — BASIC METABOLIC PANEL
Anion gap: 6 (ref 5–15)
CHLORIDE: 100 mmol/L — AB (ref 101–111)
CO2: 24 mmol/L (ref 22–32)
CREATININE: 0.58 mg/dL — AB (ref 0.61–1.24)
Calcium: 8 mg/dL — ABNORMAL LOW (ref 8.9–10.3)
GFR calc Af Amer: >60 mL/min (ref >60)
GFR calc non Af Amer: >60 mL/min (ref >60)
Glucose, Bld: 91 mg/dL (ref 65–99)
Potassium: 3.1 mmol/L — ABNORMAL LOW (ref 3.5–5.1)
SODIUM: 130 mmol/L — AB (ref 135–145)

## 2016-09-20 LAB — TYPE AND SCREEN
Blood Product Expiration Date: 201801142359
Blood Product Expiration Date: 201801212359
Blood Product Expiration Date: 201801222359
Blood Product Expiration Date: 201801242359
ISSUE DATE / TIME: 201801121224
ISSUE DATE / TIME: 201801102135
ISSUE DATE / TIME: 201801110055
ISSUE DATE / TIME: 201801110855
UNIT TYPE AND RH: 5100
Unit Type and Rh: 5100
Unit Type and Rh: 5100
Unit Type and Rh: 9500

## 2016-09-20 LAB — MAGNESIUM: Magnesium: 2.1 mg/dL (ref 1.7–2.4)

## 2016-09-20 LAB — CBC
HCT: 26.4 % — ABNORMAL LOW (ref 39.0–52.0)
HEMOGLOBIN: 8.6 g/dL — AB (ref 13.0–17.0)
MCH: 23.3 pg — AB (ref 26.0–34.0)
MCHC: 32.6 g/dL (ref 30.0–36.0)
MCV: 71.5 fL — ABNORMAL LOW (ref 78.0–100.0)
Platelets: 197 10*3/uL (ref 150–400)
RBC: 3.69 MIL/uL — ABNORMAL LOW (ref 4.22–5.81)
RDW: 28.1 % — ABNORMAL HIGH (ref 11.5–15.5)
WBC: 12.6 10*3/uL — ABNORMAL HIGH (ref 4.0–10.5)

## 2016-09-20 LAB — PHOSPHORUS: PHOSPHORUS: 2.6 mg/dL (ref 2.5–4.6)

## 2016-09-20 MED ORDER — ZOLPIDEM TARTRATE 5 MG PO TABS
5.0000 mg | ORAL_TABLET | Freq: Every evening | ORAL | Status: DC | PRN
  Administered 2016-09-20: 5 mg via ORAL
  Filled 2016-09-20: qty 1

## 2016-09-20 MED ORDER — POTASSIUM CHLORIDE CRYS ER 20 MEQ PO TBCR
40.0000 meq | EXTENDED_RELEASE_TABLET | Freq: Once | ORAL | Status: AC
  Administered 2016-09-20: 40 meq via ORAL
  Filled 2016-09-20: qty 2

## 2016-09-20 MED ORDER — PANTOPRAZOLE SODIUM 40 MG PO TBEC
40.0000 mg | DELAYED_RELEASE_TABLET | Freq: Every day | ORAL | Status: DC
  Administered 2016-09-21: 40 mg via ORAL
  Filled 2016-09-20 (×2): qty 1

## 2016-09-20 NOTE — Progress Notes (Signed)
Patient ID: James [name], male   DOB: [birth year], 61 y.o.   MRN: [id number]    Progress Note   Subjective   Feels ok - ate well this am, no c/o abdominal  pain, had one stool this am pt says brown with some red blood in it HGB 8.6 stable EGD - portal gastropathy, no varices  Capsule endoscopy completed    Objective   Vital signs in last 24 hours: Temp:  [98.1 F (36.7 C)-98.9 F (37.2 C)] 98.4 F (36.9 C) (01/13 0513) Pulse Rate:  [72-87] 77 (01/13 0513) Resp:  [12-19] 18 (01/13 0513) BP: (94-112)/(45-63) 99/47 (01/13 0513) SpO2:  [95 %-100 %] 96 % (01/13 0513) Weight:  [103 lb (46.7 kg)-103 lb 8 oz (46.9 kg)] 103 lb 8 oz (46.9 kg) (01/12 2100) Last BM Date: 09/20/15 General:    Thin AA male  in NAD Heart:  Regular rate and rhythm; no murmurs Lungs: Respirations even and unlabored, lungs CTA bilaterally Abdomen:  Soft, nontender and nondistended. Normal bowel sounds. Extremities:  Without edema. Neurologic:  Alert and oriented,  grossly normal neurologically. Psych:  Cooperative. Normal mood and affect.  Intake/Output from previous day: 01/12 0701 - 01/13 0700 In: 1795 [P.O.:960; I.V.:500; Blood:335] Out: 400 [Urine:400] Intake/Output this shift: No intake/output data recorded.  Lab Results:  Recent Labs  09/18/16 1439 09/19/16 0657 09/20/16 0649  WBC 10.2 9.9 12.6*  HGB 7.5* 6.8* 8.6*  HCT 23.1* 21.5* 26.4*  PLT 180 216 197   BMET  Recent Labs  09/18/16 0649 09/19/16 0657 09/20/16 0649  NA 129* 130* 130*  K 3.3* 3.1* 3.1*  CL 96* 98* 100*  CO2 21* 24 24  GLUCOSE 72 79 91  BUN 5* <5* <5*  CREATININE 0.68 0.55* 0.58*  CALCIUM 7.9* 8.0* 8.0*   LFT  Recent Labs  09/17/16 1759  PROT 7.8  ALBUMIN 3.2*  AST 36  ALT 20  ALKPHOS 161*  BILITOT 0.4   PT/INR No results for input(s): LABPROT, INR in the last 72 hours.        Assessment / Plan:    #1 61 yo AA male with  Hx ETOH abuse,previous large gastric ulcer 12 /2016,hx IDA admitted  with weakness and hgb 2.7! Pt had been having dark stool at home, also using BC powders EGD yesterday -no ulcer , no varices-does have portal gastropathy Capsule endoscopy has been completed- will need to read this weekend  HGB stable-follow serial hgb's Further recc's after Capsule endoscopy interpreted   Principal Problem:   Iron deficiency anemia due to chronic blood loss Active Problems:   Anemia due to GI blood loss   Alcohol abuse   Alcoholic gastritis with hemorrhage     LOS: 3 days   Carolle Ishii  09/20/2016, 10:18 AM

## 2016-09-20 NOTE — Evaluation (Signed)
Physical Therapy Evaluation Patient Details Name: James [name] MRN: [id number] DOB: 10-02-1955 Today's Date: 09/20/2016   History of Present Illness  Patient is a 61 yo male admitted 09/17/16 with weakness, anemia (Hgb 2.7), poss GIB.    PMH:  ETOH/polysubstance abuse, large gastric ulcer 2016.  Clinical Impression  Patient presents with problems listed below.  Will benefit from acute PT to maximize functional independence prior to return home with family.  Patient with decreased balance/strength impacting mobility/safety.  Recommend use of RW for balance/safety, and f/u HHPT at d/c for continued therapy.    Follow Up Recommendations Home health PT;Supervision for mobility/OOB    Equipment Recommendations  Rolling walker with 5" wheels    Recommendations for Other Services       Precautions / Restrictions Precautions Precautions: Fall Restrictions Weight Bearing Restrictions: No      Mobility  Bed Mobility Overal bed mobility: Modified Independent             General bed mobility comments: Increased time  Transfers Overall transfer level: Needs assistance Equipment used: None Transfers: Sit to/from Stand Sit to Stand: Min guard         General transfer comment: Min guard for safety/balance  Ambulation/Gait Ambulation/Gait assistance: Min assist Ambulation Distance (Feet): 120 Feet Assistive device: None Gait Pattern/deviations: Step-through pattern;Decreased stride length;Shuffle;Staggering left;Staggering right;Narrow base of support Gait velocity: decreased Gait velocity interpretation: Below normal speed for age/gender General Gait Details: Patient with slow, unsteady gait.  Narrow base of support, with patient stepping on own feet.  Requires assist due to decreased balance, staggering to both sides.  Stairs            Wheelchair Mobility    Modified Rankin (Stroke Patients Only)       Balance Overall balance assessment: Needs  assistance Sitting-balance support: No upper extremity supported;Feet supported Sitting balance-Leahy Scale: Good     Standing balance support: No upper extremity supported Standing balance-Leahy Scale: Fair Standing balance comment: Able to maintain static standing balance.  Requires assist/UE support for dynamic activities/gait                             Pertinent Vitals/Pain Pain Assessment: No/denies pain    Home Living Family/patient expects to be discharged to:: Private residence Living Arrangements: Spouse/significant other;Children Available Help at Discharge: Family;Available PRN/intermittently (Wife and son work during day per patient) Type of Home: Apartment Home Access: Level entry     Home Layout: One level Home Equipment: None      Prior Function Level of Independence: Independent;Needs assistance   Gait / Transfers Assistance Needed: Patient reports furniture walking due to weakness pta.  ADL's / Homemaking Assistance Needed: Independent ADL's - reports leans against wall to take shower.        Hand Dominance        Extremity/Trunk Assessment   Upper Extremity Assessment Upper Extremity Assessment: Overall WFL for tasks assessed    Lower Extremity Assessment Lower Extremity Assessment: Generalized weakness    Cervical / Trunk Assessment Cervical / Trunk Assessment: Normal  Communication   Communication: No difficulties  Cognition Arousal/Alertness: Awake/alert Behavior During Therapy: WFL for tasks assessed/performed;Flat affect Overall Cognitive Status: Within Functional Limits for tasks assessed                      General Comments      Exercises     Assessment/Plan    PT  Assessment Patient needs continued PT services  PT Problem List Decreased strength;Decreased activity tolerance;Decreased balance;Decreased mobility;Decreased coordination;Decreased knowledge of use of DME          PT Treatment  Interventions DME instruction;Gait training;Functional mobility training;Therapeutic activities;Therapeutic exercise;Balance training;Patient/family education    PT Goals (Current goals can be found in the Care Plan section)  Acute Rehab PT Goals Patient Stated Goal: To get stronger PT Goal Formulation: With patient Time For Goal Achievement: 09/27/16 Potential to Achieve Goals: Good    Frequency Min 3X/week   Barriers to discharge Decreased caregiver support Wife and son work during day.    Co-evaluation               End of Session Equipment Utilized During Treatment: Gait belt Activity Tolerance: Patient tolerated treatment well;Patient limited by fatigue Patient left: in bed;with call bell/phone within reach;with bed alarm set Nurse Communication: Mobility status (Patient reports personal phone was taken out with laundry)         Time: [id number] PT Time Calculation (min) (ACUTE ONLY): 16 min   Charges:   PT Evaluation $PT Eval Moderate Complexity: 1 Procedure     PT G Codes:        Despina Pole 07-Oct-2016, 1:02 PM Carita Pian. Sanjuana Kava, [place] Pager [phone]

## 2016-09-20 NOTE — Procedures (Signed)
Video Capsule Endoscopy performed while inpatient.  --Complete Study with good prep. --Gastric transit time 8 min --Small bowel transit time 3 hr, 26 min.  1. Gastropathy without bleeding on limited views on non-distended stomach. 2. Minor pinpoint heme in proximal duodenum, possibly related to recent EGD.  No active bleeding 3. Small, nonbleeding, benign-appearing lymphoid nodules in the terminal ileum (not pathologic) 4. Otherwise normal appearance of the small bowel with no source of bleeding found.  Complete study with good preparation. No source of GI blood loss or iron deficiency anemia identified in the small bowel   Monitor Hgb closely Replace iron IV then oral   Formal report submitted to be scanned into medical record

## 2016-09-20 NOTE — Progress Notes (Addendum)
PROGRESS NOTE        PATIENT DETAILS Name: James [name] Age: 61 y.o. Sex: male Date of Birth: 06-Apr-1956 Admit Date: 09/17/2016 Admitting Physician Etta Quill, DO TZ:[id number] CHARLES, MD  Brief Narrative: Patient is a 61 y.o. male with extensive history of EtOH use, prior history of peptic ulcer disease-admitted on 1/10 for generalized weakness and malaise, found to have a hemoglobin of 2.7. Did endorse melanotic stools one week prior to admission. Patient transfused a total of 4 units of PRBC so far, given IV iron. GI consulted, underwent endoscopy that was negative for any bleeding source. Awaiting results of capsule endoscopy. See below for further details  Subjective: Apparently one maroon-colored stools with some blood clots last night. Patient appears very weak this morning.  Assessment/Plan: Upper GI bleeding: Did give history of melanotic stools a week prior to this admission, apparently had some maroon colored stools last night. Endoscopy on 1/12 negative for bleeding foci.. Capsule endoscopy in process. Hemoglobin is now stable after 4 units of PRBC and IV iron. Gastroenterology following.  Acute on chronic blood loss anemia: Given 4 units of PRBC so far, will be discharged home on iron supplementation. Probably has chronic iron deficiency anemia at baseline. Continue to follow hemoglobin.  Alcohol abuse: no withdrawal symptoms currently. Last drink on 1/10. Continue Ativan per CIWA protocol. Social work evaluation prior to discharge.  Hypokalemia: Replete and recheck  Hypophosphatemia: Repleted  Generalized deconditioning: Secondary to acute illness/chronic overall health. PT evaluation will be obtained  DVT Prophylaxis: SCD's  Code Status: Full code.  Family Communication: None at bedside.   Disposition Plan: Remain inpatient-home likely early next week  Antimicrobial agents: Anti-infectives    None     Procedures: EGD  1/11 Capsule Endo 1/11  CONSULTS: GI  Time spent: 25 minutes - Greater than 50% of this time was spent in counseling, explanation of diagnosis, planning of further management, and coordination of care.  MEDICATIONS: Scheduled Meds: . sodium chloride   Intravenous Once  . acetaminophen  650 mg Oral Once  . diphenhydrAMINE  25 mg Intravenous Once  . folic acid  1 mg Oral Daily  . furosemide  20 mg Intravenous Once  . magnesium oxide  400 mg Oral BID  . multivitamin with minerals  1 tablet Oral Daily  . [START ON 09/21/2016] pantoprazole  40 mg Intravenous Q12H  . potassium chloride  40 mEq Oral Once  . thiamine  100 mg Oral Daily   Or  . thiamine  100 mg Intravenous Daily   Continuous Infusions: . sodium chloride    . pantoprozole (PROTONIX) infusion 8 mg/hr (09/20/16 0541)   PRN Meds:.LORazepam **OR** LORazepam   PHYSICAL EXAM: Vital signs: Vitals:   09/19/16 1515 09/19/16 2100 09/20/16 0108 09/20/16 0513  BP: (!) 100/55 (!) 96/45 (!) 110/49 (!) 99/47  Pulse: 72 85 77 77  Resp: 18 18 18 18   Temp: 98.2 F (36.8 C) 98.9 F (37.2 C) 98.1 F (36.7 C) 98.4 F (36.9 C)  TempSrc: Oral Oral Oral Oral  SpO2: 100% 99% 100% 96%  Weight:  46.9 kg (103 lb 8 oz)    Height:       Filed Weights   09/18/16 2125 09/19/16 1355 09/19/16 2100  Weight: 47 kg (103 lb 9.9 oz) 46.7 kg (103 lb) 46.9 kg (103 lb 8 oz)  Body mass index is 15.28 kg/m.   General appearance :Awake, alert, not in any distress. Speech Clear. Mild malaise, very cachectic and chronically sick looking.  Eyes:, pupils equally reactive to light and accomodation,no scleral icterus.Pink conjunctiva HEENT: Atraumatic and Normocephalic Neck: supple, no JVD. No cervical lymphadenopathy. No thyromegaly Resp:Good air entry bilaterally, no added sounds  CVS: S1 S2 regular, no murmurs.  GI: Bowel sounds present, Non tender and not distended with no gaurding, rigidity or rebound.No organomegaly Extremities: B/L Lower Ext  shows no edema, both legs are warm to touch Neurology:  speech clear,Non focal, sensation is grossly intact. Psychiatric: Normal judgment and insight. Alert and oriented x 3. Normal mood. Musculoskeletal:No digital cyanosis Skin:No Rash, warm and dry Wounds:N/A  I have personally reviewed following labs and imaging studies  LABORATORY DATA: CBC:  Recent Labs Lab 09/17/16 2000 09/18/16 0649 09/18/16 1439 09/19/16 0657 09/20/16 0649  WBC 9.1 10.9* 10.2 9.9 12.6*  HGB 2.7* 5.5* 7.5* 6.8* 8.6*  HCT 11.1* 18.2* 23.1* 21.5* 26.4*  MCV 55.5* 66.2* 69.8* 68.7* 71.5*  PLT 245 200 180 216 [id number]    Basic Metabolic Panel:  Recent Labs Lab 09/17/16 1759 09/18/16 0649 09/19/16 0657 09/20/16 0649  NA 132* 129* 130* 130*  K 3.4* 3.3* 3.1* 3.1*  CL 97* 96* 98* 100*  CO2 20* 21* 24 24  GLUCOSE 82 72 79 91  BUN <5* 5* <5* <5*  CREATININE 0.57* 0.68 0.55* 0.58*  CALCIUM 8.6* 7.9* 8.0* 8.0*  MG  --   --  1.9 2.1  PHOS  --  1.9* 1.0* 2.6    GFR: Estimated Creatinine Clearance: 66.8 mL/min (by C-G formula based on SCr of 0.58 mg/dL (L)).  Liver Function Tests:  Recent Labs Lab 09/17/16 1759  AST 36  ALT 20  ALKPHOS 161*  BILITOT 0.4  PROT 7.8  ALBUMIN 3.2*    Recent Labs Lab 09/17/16 1759  LIPASE 16   No results for input(s): AMMONIA in the last 168 hours.  Coagulation Profile: No results for input(s): INR, PROTIME in the last 168 hours.  Cardiac Enzymes: No results for input(s): CKTOTAL, CKMB, CKMBINDEX, TROPONINI in the last 168 hours.  BNP (last 3 results) No results for input(s): PROBNP in the last 8760 hours.  HbA1C: No results for input(s): HGBA1C in the last 72 hours.  CBG: No results for input(s): GLUCAP in the last 168 hours.  Lipid Profile: No results for input(s): CHOL, HDL, LDLCALC, TRIG, CHOLHDL, LDLDIRECT in the last 72 hours.  Thyroid Function Tests: No results for input(s): TSH, T4TOTAL, FREET4, T3FREE, THYROIDAB in the last 72  hours.  Anemia Panel:  Recent Labs  09/17/16 2000 09/17/16 2010  FERRITIN 4*  --   TIBC  --  405  IRON  --  <5*    Urine analysis:    Component Value Date/Time   COLORURINE YELLOW 09/18/2016 0409   APPEARANCEUR CLEAR 09/18/2016 0409   LABSPEC 1.011 09/18/2016 0409   PHURINE 5.0 09/18/2016 0409   GLUCOSEU NEGATIVE 09/18/2016 0409   HGBUR NEGATIVE 09/18/2016 0409   BILIRUBINUR NEGATIVE 09/18/2016 0409   KETONESUR 5 (A) 09/18/2016 0409   PROTEINUR NEGATIVE 09/18/2016 0409   NITRITE NEGATIVE 09/18/2016 0409   LEUKOCYTESUR NEGATIVE 09/18/2016 0409    Sepsis Labs: Lactic Acid, Venous    Component Value Date/Time   LATICACIDVEN 1.3 10/02/2011 0645    MICROBIOLOGY: Recent Results (from the past 240 hour(s))  MRSA PCR Screening     Status: None   Collection  Time: 09/18/16  4:49 AM  Result Value Ref Range Status   MRSA by PCR NEGATIVE NEGATIVE Final    Comment:        The GeneXpert MRSA Assay (FDA approved for NASAL specimens only), is one component of a comprehensive MRSA colonization surveillance program. It is not intended to diagnose MRSA infection nor to guide or monitor treatment for MRSA infections.     RADIOLOGY STUDIES/RESULTS: No results found.   LOS: 3 days   Jerin Franzel,   If 7PM-7AM, please contact night-coverage [email or web address] Password [email or web address] 09/20/2016, 11:11 AM

## 2016-09-20 NOTE — Progress Notes (Signed)
At approximately 0100, patient had a moderate sized stool that was loose and maroon colored.  He did have quarter sized clot.  VS were stable - B/P 110/49, HR - 77.  No c/o dizziness or SOB.  As night progressed, patient pulled out his right hand PIV.  On separate occasion, he twisted left PIV from [place].  He did not recall either instance.  Will continue to monitor patient.  [employer] RN-BC, WTA.

## 2016-09-21 LAB — BASIC METABOLIC PANEL
Anion gap: 7 (ref 5–15)
BUN: <5 mg/dL — ABNORMAL LOW (ref 6–20)
CALCIUM: 8.1 mg/dL — AB (ref 8.9–10.3)
CO2: 22 mmol/L (ref 22–32)
CREATININE: 0.53 mg/dL — AB (ref 0.61–1.24)
Chloride: 104 mmol/L (ref 101–111)
GFR calc non Af Amer: >60 mL/min (ref >60)
GLUCOSE: 94 mg/dL (ref 65–99)
Potassium: 3 mmol/L — ABNORMAL LOW (ref 3.5–5.1)
Sodium: 133 mmol/L — ABNORMAL LOW (ref 135–145)

## 2016-09-21 LAB — CBC
HEMATOCRIT: 26.5 % — AB (ref 39.0–52.0)
Hemoglobin: 8.5 g/dL — ABNORMAL LOW (ref 13.0–17.0)
MCH: 23.8 pg — ABNORMAL LOW (ref 26.0–34.0)
MCHC: 32.1 g/dL (ref 30.0–36.0)
MCV: 74.2 fL — ABNORMAL LOW (ref 78.0–100.0)
Platelets: 225 10*3/uL (ref 150–400)
RBC: 3.57 MIL/uL — ABNORMAL LOW (ref 4.22–5.81)
RDW: 29.2 % — AB (ref 11.5–15.5)
WBC: 11.1 10*3/uL — ABNORMAL HIGH (ref 4.0–10.5)

## 2016-09-21 LAB — MAGNESIUM: Magnesium: 1.9 mg/dL (ref 1.7–2.4)

## 2016-09-21 MED ORDER — FOLIC ACID 1 MG PO TABS: 1.0000 mg | ORAL_TABLET | Freq: Every day | ORAL | 0 refills | Status: DC

## 2016-09-21 MED ORDER — MAGNESIUM SULFATE 2 GM/50ML IV SOLN
2.0000 g | Freq: Once | INTRAVENOUS | Status: AC
Start: 2016-09-21 — End: 2016-09-21
  Administered 2016-09-21: 2 g via INTRAVENOUS
  Filled 2016-09-21: qty 50

## 2016-09-21 MED ORDER — PANTOPRAZOLE SODIUM 40 MG PO TBEC: 40.0000 mg | DELAYED_RELEASE_TABLET | Freq: Every day | ORAL | 0 refills | Status: DC

## 2016-09-21 MED ORDER — POTASSIUM CHLORIDE CRYS ER 20 MEQ PO TBCR
40.0000 meq | EXTENDED_RELEASE_TABLET | Freq: Once | ORAL | Status: AC
  Administered 2016-09-21: 40 meq via ORAL
  Filled 2016-09-21: qty 2

## 2016-09-21 MED ORDER — THIAMINE HCL 100 MG PO TABS: 100.0000 mg | ORAL_TABLET | Freq: Every day | ORAL | 0 refills | Status: DC

## 2016-09-21 MED ORDER — FERROUS SULFATE 325 (65 FE) MG PO TABS: 325.0000 mg | ORAL_TABLET | Freq: Two times a day (BID) | ORAL | 0 refills | Status: DC

## 2016-09-21 NOTE — Care Management Note (Signed)
Case Management Note  Patient Details  Name: JOAL EAKLE MRN: [id number] Date of Birth: November 25, 1955  Subjective/Objective:                  Anemia due to GI blood loss Action/Plan: Discharge planning Expected Discharge Date:  09/21/16               Expected Discharge Plan:  [place]  In-House Referral:     Discharge planning Services  CM Consult  Post Acute Care Choice:  Home Health Choice offered to:  Patient  DME Arranged:  Walker rolling DME Agency:  [facility] Arranged:  PT [facility] Agency:  [place]  Status of Service:  Completed, signed off  If discussed at [place] of Stay Meetings, dates discussed:    Additional Comments: CM met with pt in room to offer choice of home health agency. Pt chooses [facility] to render HHPT. Referral called to [facility] rep, Jermaine.  CM notified [place] DME rep, Reggie to please deliver the rolling walker to room prior to discharge.  No other CM needs were communicated. Dellie Catholic, RN 09/21/2016, 2:48 PM

## 2016-09-21 NOTE — Discharge Summary (Signed)
PATIENT DETAILS Name: James [name] Age: 61 y.o. Sex: male Date of Birth: 1956-08-06 MRN: [id number]. Admitting Physician: Etta Quill, DO TZ:[id number] Juanda Crumble, MD  Admit Date: 09/17/2016 Discharge date: 09/21/2016  Recommendations for Outpatient Follow-up:  1. Follow up with PCP in the next 3-4 days for CBC/BMP check 2. Please obtain BMP/CBC in in the next 3-4 days 3. Please ensure follow-up with Dr. Benson Norway for outpatient colonoscopy 4. Ensure iron supplementation 5. Continue counseling regarding importance of stopping alcohol use  Admitted From:  Home  Disposition: [place]: yes  Equipment/Devices: None  Discharge Condition: Stable  CODE STATUS: FULL CODE  Diet recommendation:  Regular   Brief Summary: See H&P, Labs, Consult and Test reports for all details in brief, Patient is a 61 y.o. male with extensive history of EtOH use, prior history of peptic ulcer disease-admitted on 1/10 for generalized weakness and malaise, found to have a hemoglobin of 2.7. Did endorse melanotic stools one week prior to admission. Patient transfused a total of 4 units of PRBC so far, given IV iron. GI consulted, underwent endoscopy and capsule endoscopy that was negative for any bleeding source.See below for further details  Brief Hospital Course: Upper GI bleeding: Did give history of melanotic stools a week prior to this admission, apparently had some maroon colored stools last night. Upper GI Endoscopy on 1/12 negative for bleeding foci. Capsule endoscopy not show any obvious bleeding foci as well.. Hemoglobin is now stable at 8.5 after 4 units of PRBC and IV iron. Spoke with Dr. Peri Jefferson M.D. covering Dr. Roxana Hires to discharge today, patient will need to follow-up with Dr. Benson Norway for consideration of outpatient colonoscopy. Note, per RN-patient had brown stools on 1/13.  Acute on chronic blood loss anemia: Given 4 units of PRBC so far, will be discharged home on iron  supplementation. Was given 1 g of IV iron in the hospital-spoke with pharmacy-does not require any further IV iron. Probably has chronic iron deficiency anemia at baseline. Continue to follow hemoglobin the outpatient setting.  Alcohol abuse: no withdrawal symptoms currently. Last drink on 1/10. Managed with Ativan per CIWA protocol. Per patient, his spouse has already arranged for alcohol anonymous meetings in the outpatient setting. He seems pretty determined to quit alcohol.  Hypokalemia: Will be repleted prior to discharge, patient was encouraged to go to his PCPs office in the next 2-3 days for repeat chemistry panel check  Hypophosphatemia: Repleted  Generalized deconditioning: Secondary to acute illness/chronic overall health. PT evaluation completed, recommendations are for home health services.   Severe malnutrition in context of chronic illness, Underweight  Procedures/Studies: EGD 1/11 Capsule Endo 1/11  Discharge Diagnoses:  Principal Problem:   Iron deficiency anemia due to GI bleeding Active Problems:   Anemia due to GI blood loss   Alcohol abuse   Alcoholic gastritis with hemorrhage   Discharge Instructions:  Activity:  As tolerated with Full fall precautions use walker/cane & assistance as needed  Discharge Instructions    Call MD for:  extreme fatigue    Complete by:  As directed    Call MD for:  persistant dizziness or light-headedness    Complete by:  As directed    Diet - low sodium heart healthy    Complete by:  As directed    Discharge instructions    Complete by:  As directed    Follow with Primary MD  Wyatt Haste, MD in the next 2-3 days for a chemistry panel and  CBC check  Follow-up with Dr. Hung(gastroenterologist) in the next few weeks for a outpatient colonoscopy.  Stop drinking alcohol  Please get a complete blood count and chemistry panel checked by your Primary MD at your next visit, and again as instructed by your Primary  MD.  Get Medicines reviewed and adjusted: Please take all your medications with you for your next visit with your Primary MD  Laboratory/radiological data: Please request your Primary MD to go over all hospital tests and procedure/radiological results at the follow up, please ask your Primary MD to get all Hospital records sent to his/her office.  In some cases, they will be blood work, cultures and biopsy results pending at the time of your discharge. Please request that your primary care M.D. follows up on these results.  Also Note the following: If you experience worsening of your admission symptoms, develop shortness of breath, life threatening emergency, suicidal or homicidal thoughts you must seek medical attention immediately by calling 911 or calling your MD immediately  if symptoms less severe.  You must read complete instructions/literature along with all the possible adverse reactions/side effects for all the Medicines you take and that have been prescribed to you. Take any new Medicines after you have completely understood and accpet all the possible adverse reactions/side effects.   Do not drive when taking Pain medications or sleeping medications (Benzodaizepines)  Do not take more than prescribed Pain, Sleep and Anxiety Medications. It is not advisable to combine anxiety,sleep and pain medications without talking with your primary care practitioner  Special Instructions: If you have smoked or chewed Tobacco  in the last 2 yrs please stop smoking, stop any regular Alcohol  and or any Recreational drug use.  Wear Seat belts while driving.  Please note: You were cared for by a hospitalist during your hospital stay. Once you are discharged, your primary care physician will handle any further medical issues. Please note that NO REFILLS for any discharge medications will be authorized once you are discharged, as it is imperative that you return to your primary care physician (or  establish a relationship with a primary care physician if you do not have one) for your post hospital discharge needs so that they can reassess your need for medications and monitor your lab values.   Increase activity slowly    Complete by:  As directed      Allergies as of 09/21/2016   No Known Allergies     Medication List    STOP taking these medications   amoxicillin-clarithromycin-lansoprazole combo pack Commonly known as:  PREVPAC   lansoprazole 15 MG capsule Commonly known as:  PREVACID   magnesium oxide 400 (241.3 Mg) MG tablet Commonly known as:  MAG-OX     TAKE these medications   ferrous sulfate 325 (65 FE) MG tablet Take 1 tablet (325 mg total) by mouth 2 (two) times daily with a meal.   folic acid 1 MG tablet Commonly known as:  FOLVITE Take 1 tablet (1 mg total) by mouth daily. Start taking on:  09/22/2016   multivitamin with minerals Tabs tablet Take 1 tablet by mouth daily.   pantoprazole 40 MG tablet Commonly known as:  PROTONIX Take 1 tablet (40 mg total) by mouth daily at 6 (six) AM. Start taking on:  09/22/2016   thiamine 100 MG tablet Take 1 tablet (100 mg total) by mouth daily. Start taking on:  09/22/2016            Durable Medical  Equipment        Start     Ordered   09/20/16 1610  For home use only DME Walker rolling  Once    Question:  Patient needs a walker to treat with the following condition  Answer:  Physical deconditioning   09/20/16 1609     Follow-up Information    Wyatt Haste, MD. Schedule an appointment as soon as possible for a visit in 3 day(s).   Specialty:  Family Medicine Why:  for CBC and BMET check Contact information: [place] [address] [phone]        HUNG,PATRICK D, MD. Schedule an appointment as soon as possible for a visit in 3 week(s).   Specialty:  Gastroenterology Why:  for consideration of outpatient of Colonoscopy Contact information: [place], [place] [address] [phone]          No Known Allergies  Consultations:   GI  Other Procedures/Studies:  No results found.   TODAY-DAY OF DISCHARGE:  Subjective:   Allena Napoleon today has no headache,no chest abdominal pain,no new weakness tingling or numbness, feels much better wants to go home today.   Objective:   Blood pressure (!) 112/55, pulse 80, temperature 98.4 F (36.9 C), temperature source Oral, resp. rate 18, height 5\' 9"  (1.753 m), weight 47.5 kg (104 lb 11.5 oz), SpO2 98 %.  Intake/Output Summary (Last 24 hours) at 09/21/16 1053 Last data filed at 09/21/16 0600  Gross per 24 hour  Intake             1080 ml  Output              501 ml  Net              579 ml   Filed Weights   09/19/16 1355 09/19/16 2100 09/20/16 2149  Weight: 46.7 kg (103 lb) 46.9 kg (103 lb 8 oz) 47.5 kg (104 lb 11.5 oz)    Exam: Awake Alert, Oriented *3, No new F.N deficits, Normal affect [place].AT,PERRAL Supple Neck,No JVD, No cervical lymphadenopathy appriciated.  Symmetrical Chest wall movement, Good air movement bilaterally, CTAB RRR,No Gallops,Rubs or new Murmurs, No Parasternal Heave +ve B.Sounds, Abd Soft, Non tender, No organomegaly appriciated, No rebound -guarding or rigidity. No Cyanosis, Clubbing or edema, No new Rash or bruise   PERTINENT RADIOLOGIC STUDIES: No results found.   PERTINENT LAB RESULTS: CBC:  Recent Labs  09/20/16 0649 09/21/16 0521  WBC 12.6* 11.1*  HGB 8.6* 8.5*  HCT 26.4* 26.5*  PLT 197 225   CMET CMP     Component Value Date/Time   NA 133 (L) 09/21/2016 0521   K 3.0 (L) 09/21/2016 0521   CL 104 09/21/2016 0521   CO2 22 09/21/2016 0521   GLUCOSE 94 09/21/2016 0521   BUN <5 (L) 09/21/2016 0521   CREATININE 0.53 (L) 09/21/2016 0521   CREATININE 0.49 (L) 09/05/2015 0001   CALCIUM 8.1 (L) 09/21/2016 0521   PROT 7.8 09/17/2016 1759   ALBUMIN 3.2 (L) 09/17/2016 1759   AST 36 09/17/2016 1759   ALT 20 09/17/2016  1759   ALKPHOS 161 (H) 09/17/2016 1759   BILITOT 0.4 09/17/2016 1759   GFRNONAA >60 09/21/2016 0521   GFRAA >60 09/21/2016 0521    GFR Estimated Creatinine Clearance: 67.6 mL/min (by C-G formula based on SCr of 0.53 mg/dL (L)). No results for input(s): LIPASE, AMYLASE in the last 72 hours. No results for input(s): CKTOTAL, CKMB, CKMBINDEX, TROPONINI  in the last 72 hours. Invalid input(s): POCBNP No results for input(s): DDIMER in the last 72 hours. No results for input(s): HGBA1C in the last 72 hours. No results for input(s): CHOL, HDL, LDLCALC, TRIG, CHOLHDL, LDLDIRECT in the last 72 hours. No results for input(s): TSH, T4TOTAL, T3FREE, THYROIDAB in the last 72 hours.  Invalid input(s): FREET3 No results for input(s): VITAMINB12, FOLATE, FERRITIN, TIBC, IRON, RETICCTPCT in the last 72 hours. Coags: No results for input(s): INR in the last 72 hours.  Invalid input(s): PT Microbiology: Recent Results (from the past 240 hour(s))  MRSA PCR Screening     Status: None   Collection Time: 09/18/16  4:49 AM  Result Value Ref Range Status   MRSA by PCR NEGATIVE NEGATIVE Final    Comment:        The GeneXpert MRSA Assay (FDA approved for NASAL specimens only), is one component of a comprehensive MRSA colonization surveillance program. It is not intended to diagnose MRSA infection nor to guide or monitor treatment for MRSA infections.     FURTHER DISCHARGE INSTRUCTIONS:  Get Medicines reviewed and adjusted: Please take all your medications with you for your next visit with your Primary MD  Laboratory/radiological data: Please request your Primary MD to go over all hospital tests and procedure/radiological results at the follow up, please ask your Primary MD to get all Hospital records sent to his/her office.  In some cases, they will be blood work, cultures and biopsy results pending at the time of your discharge. Please request that your primary care M.D. goes through all the  records of your hospital data and follows up on these results.  Also Note the following: If you experience worsening of your admission symptoms, develop shortness of breath, life threatening emergency, suicidal or homicidal thoughts you must seek medical attention immediately by calling 911 or calling your MD immediately  if symptoms less severe.  You must read complete instructions/literature along with all the possible adverse reactions/side effects for all the Medicines you take and that have been prescribed to you. Take any new Medicines after you have completely understood and accpet all the possible adverse reactions/side effects.   Do not drive when taking Pain medications or sleeping medications (Benzodaizepines)  Do not take more than prescribed Pain, Sleep and Anxiety Medications. It is not advisable to combine anxiety,sleep and pain medications without talking with your primary care practitioner  Special Instructions: If you have smoked or chewed Tobacco  in the last 2 yrs please stop smoking, stop any regular Alcohol  and or any Recreational drug use.  Wear Seat belts while driving.  Please note: You were cared for by a hospitalist during your hospital stay. Once you are discharged, your primary care physician will handle any further medical issues. Please note that NO REFILLS for any discharge medications will be authorized once you are discharged, as it is imperative that you return to your primary care physician (or establish a relationship with a primary care physician if you do not have one) for your post hospital discharge needs so that they can reassess your need for medications and monitor your lab values.  Total Time spent coordinating discharge including counseling, education and face to face time equals 45 minutes.  SignedOren Binet 09/21/2016 10:53 AM

## 2016-09-21 NOTE — Progress Notes (Signed)
Patient request something for sleep.  MD called and received order for Ambien.  Medication given to patient.  Will continue to monitor patient.  [employer] RN-BC, WTA.

## 2016-09-21 NOTE — Progress Notes (Signed)
Patient ID: [name], male   DOB: 1955-12-02, 60 y.o.   MRN: [id number]    Progress Note   Subjective  Feels fine- eating without difficulty Stools brown HGB 8.5 very stable   Objective   Vital signs in last 24 hours: Temp:  [98 F (36.7 C)-98.3 F (36.8 C)] 98 F (36.7 C) (01/14 0502) Pulse Rate:  [78-81] 78 (01/14 0502) Resp:  [18] 18 (01/14 0502) BP: (104-110)/(52-70) 105/52 (01/14 0502) SpO2:  [96 %-98 %] 97 % (01/14 0502) Weight:  [104 lb 11.5 oz (47.5 kg)] 104 lb 11.5 oz (47.5 kg) (01/13 2149) Last BM Date: 09/21/15 General:    AA male in NAD Heart:  Regular rate and rhythm; no murmurs Lungs: Respirations even and unlabored, lungs CTA bilaterally Abdomen:  Soft, nontender and nondistended. Normal bowel sounds. Extremities:  Without edema. Neurologic:  Alert and oriented,  grossly normal neurologically. Psych:  Cooperative. Normal mood and affect.  Intake/Output from previous day: 01/13 0701 - 01/14 0700 In: 1080 [P.O.:1080] Out: 501 [Urine:500; Stool:1] Intake/Output this shift: No intake/output data recorded.  Lab Results:  Recent Labs  09/19/16 0657 09/20/16 0649 09/21/16 0521  WBC 9.9 12.6* 11.1*  HGB 6.8* 8.6* 8.5*  HCT 21.5* 26.4* 26.5*  PLT 216 197 225   BMET  Recent Labs  09/19/16 0657 09/20/16 0649 09/21/16 0521  NA 130* 130* 133*  K 3.1* 3.1* 3.0*  CL 98* 100* 104  CO2 24 24 22   GLUCOSE 79 91 94  BUN <5* <5* <5*  CREATININE 0.55* 0.58* 0.53*  CALCIUM 8.0* 8.0* 8.1*   LFT No results for input(s): PROT, ALBUMIN, AST, ALT, ALKPHOS, BILITOT, BILIDIR, IBILI in the last 72 hours. PT/INR No results for input(s): LABPROT, INR in the last 72 hours.  Studies/Results: No results found.     Assessment / Plan:    #1 61 yo AA male alcoholic with subacute GI bleed , hgb 2.7, prior hx of gastric ulcer , and found iron deficient EGD showed mild  portal gastropathy/?GAVE -no ulcer Capsule endoscopy is negative Last Colonoscopy 2016  with suboptimal prep , Colon 2013 with 3 large polyps- tubular adenomas (one with high grade dysplasia)  He is stable, no active bleeding Iron is being replaced- needs second dose IV iron  If we are calling this portal gastropathy he needs to be on B blocker ( has not carried prior dx of Cirrhosis), he has had previous admits for iron deficiency anemia  Would consider repeat Colonoscopy with Dr Benson Norway to r/o occult lesion Will discuss timing   Principal Problem:   Iron deficiency anemia due to chronic blood loss Active Problems:   Anemia due to GI blood loss   Alcohol abuse   Alcoholic gastritis with hemorrhage     LOS: 4 days   Amy Esterwood  09/21/2016, 9:00 AM

## 2016-09-22 ENCOUNTER — Telehealth: Payer: Self-pay | Admitting: Family Medicine

## 2016-09-22 ENCOUNTER — Encounter (HOSPITAL_COMMUNITY): Payer: Self-pay | Admitting: Gastroenterology

## 2016-09-22 LAB — H. PYLORI ANTIBODY, IGG: H PYLORI IGG: 4.03 {index_val} — AB (ref 0.00–0.79)

## 2016-09-22 NOTE — Telephone Encounter (Signed)
Left message for pt to call. Needs a hospital follow up appt per JCL.

## 2016-09-23 ENCOUNTER — Telehealth: Payer: Self-pay | Admitting: Family Medicine

## 2016-09-23 NOTE — Telephone Encounter (Signed)
Left another message for pt to call. Needs a hospital follow up appt per JCL.

## 2016-10-02 ENCOUNTER — Inpatient Hospital Stay: Payer: BC Managed Care – PPO | Admitting: Family Medicine

## 2016-10-06 ENCOUNTER — Encounter: Payer: Self-pay | Admitting: Family Medicine

## 2016-10-06 ENCOUNTER — Ambulatory Visit (INDEPENDENT_AMBULATORY_CARE_PROVIDER_SITE_OTHER): Payer: BC Managed Care – PPO | Admitting: Family Medicine

## 2016-10-06 VITALS — BP 100/50 | HR 66 | Ht 70.0 in | Wt 110.0 lb

## 2016-10-06 DIAGNOSIS — I351 Nonrheumatic aortic (valve) insufficiency: Secondary | ICD-10-CM

## 2016-10-06 DIAGNOSIS — E876 Hypokalemia: Secondary | ICD-10-CM | POA: Diagnosis not present

## 2016-10-06 DIAGNOSIS — Z23 Encounter for immunization: Secondary | ICD-10-CM | POA: Diagnosis not present

## 2016-10-06 DIAGNOSIS — D500 Iron deficiency anemia secondary to blood loss (chronic): Secondary | ICD-10-CM

## 2016-10-06 DIAGNOSIS — K089 Disorder of teeth and supporting structures, unspecified: Secondary | ICD-10-CM

## 2016-10-06 DIAGNOSIS — Z720 Tobacco use: Secondary | ICD-10-CM

## 2016-10-06 DIAGNOSIS — Z8711 Personal history of peptic ulcer disease: Secondary | ICD-10-CM

## 2016-10-06 DIAGNOSIS — F1010 Alcohol abuse, uncomplicated: Secondary | ICD-10-CM

## 2016-10-06 LAB — CBC WITH DIFFERENTIAL/PLATELET
BASOS ABS: 99 {cells}/uL (ref 0–200)
Basophils Relative: 1 %
EOS ABS: 99 {cells}/uL (ref 15–500)
EOS PCT: 1 %
HCT: 38.1 % — ABNORMAL LOW (ref 38.5–50.0)
Hemoglobin: 11.4 g/dL — ABNORMAL LOW (ref 13.2–17.1)
LYMPHS PCT: 19 %
Lymphs Abs: 1881 cells/uL (ref 850–3900)
MCH: 25.7 pg — AB (ref 27.0–33.0)
MCHC: 29.9 g/dL — ABNORMAL LOW (ref 32.0–36.0)
MCV: 85.8 fL (ref 80.0–100.0)
MONOS PCT: 6 %
Monocytes Absolute: 594 cells/uL (ref 200–950)
NEUTROS ABS: 7227 {cells}/uL (ref 1500–7800)
Neutrophils Relative %: 73 %
PLATELETS: 389 10*3/uL (ref 140–400)
RBC: 4.44 MIL/uL (ref 4.20–5.80)
WBC: 9.9 10*3/uL (ref 4.0–10.5)

## 2016-10-06 NOTE — Progress Notes (Signed)
   Subjective:    Patient ID: James [name], male    DOB: 1956/02/11, 61 y.o.   MRN: [id number]  HPI He is here for a follow-up visit after recent hospitalization and treatment for acute blood loss. His hemoglobin did drop down to 2.7. He has a previous history of GI blood loss including ulcer disease. He has had endoscopy as well as colonoscopy in the past. Presently he is having no nausea, vomiting, diarrhea, fatigue, headache, blurred or double vision. He states that he smokes 3 cigarettes per day. He states that he has had only one beer since leaving the hospital. Recently he did injure his nose while playing football with the neighbor children. He also states that he is not interested in getting help with his alcohol and plans to do this once he gets all the medical issues squared away. Review of his medical record also indicates previous diagnosis of right insufficiency.  Review of Systems     Objective:   Physical Exam Alert and in no distress. Abrasion noted over the bridge of the nose. Cardiac exam does show a diastolic murmur with questionable gallop type rhythm. Lungs are clear to auscultation. Abdominal exam shows no masses or tenderness. Oral exam does show poor dentition      Assessment & Plan:  Alcohol abuse - Plan: Ethanol  Anemia due to GI blood loss - Plan: CBC with Differential/Platelet, Comprehensive metabolic panel  Tobacco abuse  Hypokalemia - Plan: Comprehensive metabolic panel  Need for Tdap vaccination - Plan: Tdap vaccine greater than or equal to 7yo IM  Aortic valve insufficiency, etiology of cardiac valve disease unspecified - Plan: ECHOCARDIOGRAM COMPLETE  History of peptic ulcer disease I encouraged him to avoid all alcohol consumption. Also encouraged him to call the various institutions that he was given the names of to get the ball rolling for counseling and possible admission rather than waiting until he gets a medical concerns squared away. Also  encouraged him to avoid all tobacco products. Encouraged him to get in to see a dentist for his dental caries. Flu shot offered although he refused. Recheck here in one month for general follow-up purposes and to make sure that he does get appropriate alcohol abuse follow-up.

## 2016-10-07 LAB — COMPREHENSIVE METABOLIC PANEL
ALT: 13 U/L (ref 9–46)
AST: 30 U/L (ref 10–35)
Albumin: 3.3 g/dL — ABNORMAL LOW (ref 3.6–5.1)
Alkaline Phosphatase: 154 U/L — ABNORMAL HIGH (ref 40–115)
BUN: 5 mg/dL — AB (ref 7–25)
CHLORIDE: 107 mmol/L (ref 98–110)
CO2: 25 mmol/L (ref 20–31)
Calcium: 9.1 mg/dL (ref 8.6–10.3)
Creat: 0.56 mg/dL — ABNORMAL LOW (ref 0.70–1.33)
GLUCOSE: 76 mg/dL (ref 65–99)
POTASSIUM: 4.1 mmol/L (ref 3.5–5.3)
SODIUM: 140 mmol/L (ref 135–146)
Total Bilirubin: 0.8 mg/dL (ref 0.2–1.2)
Total Protein: 7.4 g/dL (ref 6.1–8.1)

## 2016-10-08 ENCOUNTER — Telehealth: Payer: Self-pay

## 2016-10-08 NOTE — Telephone Encounter (Signed)
I have called pt to inform him of [facility] Feb.14 th at 3 pm he is to arrive at 2:30 pm [email or web address] [phone] auth # [id number] Good 10/08/16 to 11/06/16 left voice mail

## 2016-10-09 LAB — ETHANOL, CLINICAL
Alcohol, Ethyl (B): 0.021 g/dL(%) — ABNORMAL HIGH
Alcohol, Ethyl (B): 21 mg/dL — ABNORMAL HIGH

## 2016-10-22 ENCOUNTER — Other Ambulatory Visit (HOSPITAL_COMMUNITY): Payer: BC Managed Care – PPO

## 2016-11-03 ENCOUNTER — Ambulatory Visit: Payer: BC Managed Care – PPO | Admitting: Family Medicine

## 2016-11-04 ENCOUNTER — Ambulatory Visit: Payer: BC Managed Care – PPO | Admitting: Family Medicine

## 2016-11-10 ENCOUNTER — Ambulatory Visit: Payer: BC Managed Care – PPO | Admitting: Family Medicine

## 2016-11-10 ENCOUNTER — Telehealth: Payer: Self-pay

## 2016-11-10 NOTE — Telephone Encounter (Signed)
Left message for pt to please call us back.

## 2016-11-10 NOTE — Telephone Encounter (Signed)

## 2016-11-10 NOTE — Telephone Encounter (Signed)
Schedule him back in the next week or 2

## 2016-11-11 NOTE — Telephone Encounter (Signed)
Left VM on cell to please call and make appointment in a week or two

## 2016-11-18 ENCOUNTER — Encounter: Payer: Self-pay | Admitting: Family Medicine

## 2017-03-02 ENCOUNTER — Encounter (HOSPITAL_COMMUNITY): Payer: Self-pay

## 2017-03-02 ENCOUNTER — Observation Stay (HOSPITAL_COMMUNITY)
Admission: EM | Admit: 2017-03-02 | Discharge: 2017-03-04 | Disposition: A | Discharge status: Home / Self Care | Payer: BC Managed Care – PPO | Attending: Internal Medicine | Admitting: Internal Medicine

## 2017-03-02 DIAGNOSIS — E43 Unspecified severe protein-calorie malnutrition: Secondary | ICD-10-CM | POA: Diagnosis not present

## 2017-03-02 DIAGNOSIS — E871 Hypo-osmolality and hyponatremia: Secondary | ICD-10-CM | POA: Diagnosis not present

## 2017-03-02 DIAGNOSIS — E872 Acidosis, unspecified: Secondary | ICD-10-CM

## 2017-03-02 DIAGNOSIS — R718 Other abnormality of red blood cells: Secondary | ICD-10-CM | POA: Insufficient documentation

## 2017-03-02 DIAGNOSIS — D649 Anemia, unspecified: Secondary | ICD-10-CM | POA: Insufficient documentation

## 2017-03-02 DIAGNOSIS — E876 Hypokalemia: Secondary | ICD-10-CM | POA: Diagnosis not present

## 2017-03-02 DIAGNOSIS — Z720 Tobacco use: Secondary | ICD-10-CM | POA: Diagnosis present

## 2017-03-02 DIAGNOSIS — K760 Fatty (change of) liver, not elsewhere classified: Secondary | ICD-10-CM

## 2017-03-02 DIAGNOSIS — F1010 Alcohol abuse, uncomplicated: Secondary | ICD-10-CM | POA: Diagnosis present

## 2017-03-02 DIAGNOSIS — M25551 Pain in right hip: Secondary | ICD-10-CM | POA: Diagnosis present

## 2017-03-02 LAB — URINALYSIS, ROUTINE W REFLEX MICROSCOPIC
Bilirubin Urine: NEGATIVE
Glucose, UA: NEGATIVE mg/dL
HGB URINE DIPSTICK: NEGATIVE
Ketones, ur: NEGATIVE mg/dL
LEUKOCYTES UA: NEGATIVE
NITRITE: NEGATIVE
PROTEIN: NEGATIVE mg/dL
SPECIFIC GRAVITY, URINE: 1.009 (ref 1.005–1.030)
pH: 6 (ref 5.0–8.0)

## 2017-03-02 LAB — CBC WITH DIFFERENTIAL/PLATELET
BASOS PCT: 1 %
Basophils Absolute: 0.1 10*3/uL (ref 0.0–0.1)
EOS ABS: 0.1 10*3/uL (ref 0.0–0.7)
Eosinophils Relative: 1 %
HEMATOCRIT: 10.9 % — AB (ref 39.0–52.0)
HEMOGLOBIN: 2.7 g/dL — AB (ref 13.0–17.0)
LYMPHS PCT: 19 %
Lymphs Abs: 1.4 10*3/uL (ref 0.7–4.0)
MCH: 14.9 pg — AB (ref 26.0–34.0)
MCHC: 24.8 g/dL — AB (ref 30.0–36.0)
MCV: 60.2 fL — ABNORMAL LOW (ref 78.0–100.0)
MONOS PCT: 8 %
Monocytes Absolute: 0.6 10*3/uL (ref 0.1–1.0)
NEUTROS ABS: 5.3 10*3/uL (ref 1.7–7.7)
Neutrophils Relative %: 71 %
Platelets: 167 10*3/uL (ref 150–400)
RBC: 1.81 MIL/uL — ABNORMAL LOW (ref 4.22–5.81)
RDW: 21.7 % — ABNORMAL HIGH (ref 11.5–15.5)
WBC: 7.5 10*3/uL (ref 4.0–10.5)

## 2017-03-02 LAB — COMPREHENSIVE METABOLIC PANEL
ALBUMIN: 2.9 g/dL — AB (ref 3.5–5.0)
ALT: 24 U/L (ref 17–63)
ANION GAP: 11 (ref 5–15)
AST: 41 U/L (ref 15–41)
Alkaline Phosphatase: 169 U/L — ABNORMAL HIGH (ref 38–126)
BUN: <5 mg/dL — ABNORMAL LOW (ref 6–20)
CHLORIDE: 97 mmol/L — AB (ref 101–111)
CO2: 20 mmol/L — AB (ref 22–32)
Calcium: 8.3 mg/dL — ABNORMAL LOW (ref 8.9–10.3)
Creatinine, Ser: 0.59 mg/dL — ABNORMAL LOW (ref 0.61–1.24)
GFR calc non Af Amer: >60 mL/min (ref >60)
GLUCOSE: 83 mg/dL (ref 65–99)
POTASSIUM: 3.4 mmol/L — AB (ref 3.5–5.1)
SODIUM: 128 mmol/L — AB (ref 135–145)
Total Bilirubin: 0.5 mg/dL (ref 0.3–1.2)
Total Protein: 7.4 g/dL (ref 6.5–8.1)

## 2017-03-02 LAB — I-STAT CHEM 8, ED
BUN: 4 mg/dL — AB (ref 6–20)
CALCIUM ION: 1.08 mmol/L — AB (ref 1.15–1.40)
CHLORIDE: 99 mmol/L — AB (ref 101–111)
Creatinine, Ser: 0.4 mg/dL — ABNORMAL LOW (ref 0.61–1.24)
GLUCOSE: 82 mg/dL (ref 65–99)
HEMATOCRIT: 15 % — AB (ref 39.0–52.0)
Hemoglobin: 5.1 g/dL — CL (ref 13.0–17.0)
POTASSIUM: 3.8 mmol/L (ref 3.5–5.1)
SODIUM: 132 mmol/L — AB (ref 135–145)
TCO2: 23 mmol/L (ref 0–100)

## 2017-03-02 LAB — CBG MONITORING, ED: GLUCOSE-CAPILLARY: 90 mg/dL (ref 65–99)

## 2017-03-02 LAB — PROTIME-INR
INR: 1.06
Prothrombin Time: 13.9 seconds (ref 11.4–15.2)

## 2017-03-02 LAB — RETICULOCYTES
RBC.: 2.1 MIL/uL — ABNORMAL LOW (ref 4.22–5.81)
RETIC COUNT ABSOLUTE: 21 10*3/uL (ref 19.0–186.0)
RETIC CT PCT: 1 % (ref 0.4–3.1)

## 2017-03-02 LAB — I-STAT CG4 LACTIC ACID, ED
LACTIC ACID, VENOUS: 4.27 mmol/L — AB (ref 0.5–1.9)
LACTIC ACID, VENOUS: 1.16 mmol/L (ref 0.5–1.9)

## 2017-03-02 LAB — SODIUM, URINE, RANDOM: SODIUM UR: 17 mmol/L

## 2017-03-02 LAB — POC OCCULT BLOOD, ED: FECAL OCCULT BLD: NEGATIVE

## 2017-03-02 LAB — PREPARE RBC (CROSSMATCH)

## 2017-03-02 MED ORDER — SODIUM CHLORIDE 0.9 % IV SOLN
80.0000 mg | Freq: Once | INTRAVENOUS | Status: AC
  Administered 2017-03-02: 80 mg via INTRAVENOUS
  Filled 2017-03-02: qty 80

## 2017-03-02 MED ORDER — LORAZEPAM 1 MG PO TABS: 1.0000 mg | ORAL_TABLET | Freq: Four times a day (QID) | ORAL | Status: DC | PRN

## 2017-03-02 MED ORDER — FOLIC ACID 1 MG PO TABS
1.0000 mg | ORAL_TABLET | Freq: Every day | ORAL | Status: DC
  Administered 2017-03-03 – 2017-03-04 (×2): 1 mg via ORAL
  Filled 2017-03-02 (×2): qty 1

## 2017-03-02 MED ORDER — ADULT MULTIVITAMIN W/MINERALS CH
1.0000 | ORAL_TABLET | Freq: Every day | ORAL | Status: DC
  Administered 2017-03-03 – 2017-03-04 (×2): 1 via ORAL
  Filled 2017-03-02 (×2): qty 1

## 2017-03-02 MED ORDER — SODIUM CHLORIDE 0.9 % IV SOLN
INTRAVENOUS | Status: DC
  Administered 2017-03-03: 06:00:00 via INTRAVENOUS

## 2017-03-02 MED ORDER — LORAZEPAM 2 MG/ML IJ SOLN
1.0000 mg | Freq: Four times a day (QID) | INTRAMUSCULAR | Status: DC | PRN
Start: 2017-03-02 — End: 2017-03-04

## 2017-03-02 MED ORDER — SODIUM CHLORIDE 0.9 % IV SOLN
1.0000 g | Freq: Once | INTRAVENOUS | Status: AC
  Administered 2017-03-02: 1 g via INTRAVENOUS
  Filled 2017-03-02: qty 10

## 2017-03-02 MED ORDER — ALBUTEROL SULFATE (2.5 MG/3ML) 0.083% IN NEBU: 2.5000 mg | INHALATION_SOLUTION | RESPIRATORY_TRACT | Status: DC | PRN

## 2017-03-02 MED ORDER — LORAZEPAM 2 MG/ML IJ SOLN: 0.0000 mg | Freq: Two times a day (BID) | INTRAMUSCULAR | Status: DC

## 2017-03-02 MED ORDER — THIAMINE HCL 100 MG/ML IJ SOLN
100.0000 mg | Freq: Every day | INTRAMUSCULAR | Status: DC
  Administered 2017-03-03: 100 mg via INTRAVENOUS
  Filled 2017-03-02: qty 2

## 2017-03-02 MED ORDER — SODIUM CHLORIDE 0.9 % IV SOLN
8.0000 mg/h | INTRAVENOUS | Status: DC
  Administered 2017-03-02: 8 mg/h via INTRAVENOUS
  Filled 2017-03-02 (×3): qty 80

## 2017-03-02 MED ORDER — ONDANSETRON HCL 4 MG/2ML IJ SOLN: 4.0000 mg | Freq: Four times a day (QID) | INTRAMUSCULAR | Status: DC | PRN

## 2017-03-02 MED ORDER — ONDANSETRON HCL 4 MG PO TABS: 4.0000 mg | ORAL_TABLET | Freq: Four times a day (QID) | ORAL | Status: DC | PRN

## 2017-03-02 MED ORDER — VITAMIN B-1 100 MG PO TABS
100.0000 mg | ORAL_TABLET | Freq: Every day | ORAL | Status: DC
  Administered 2017-03-04: 100 mg via ORAL
  Filled 2017-03-02: qty 1

## 2017-03-02 MED ORDER — LORAZEPAM 2 MG/ML IJ SOLN
0.0000 mg | Freq: Four times a day (QID) | INTRAMUSCULAR | Status: DC
  Administered 2017-03-03: 1 mg via INTRAVENOUS
  Filled 2017-03-02: qty 1

## 2017-03-02 MED ORDER — NICOTINE 21 MG/24HR TD PT24
21.0000 mg | MEDICATED_PATCH | Freq: Every day | TRANSDERMAL | Status: DC
  Administered 2017-03-03 – 2017-03-04 (×2): 21 mg via TRANSDERMAL
  Filled 2017-03-02 (×2): qty 1

## 2017-03-02 MED ORDER — PANTOPRAZOLE SODIUM 40 MG IV SOLR: 40.0000 mg | Freq: Two times a day (BID) | INTRAVENOUS | Status: DC

## 2017-03-02 MED ORDER — SODIUM CHLORIDE 0.9% FLUSH
3.0000 mL | Freq: Two times a day (BID) | INTRAVENOUS | Status: DC
  Administered 2017-03-03: 9 mL via INTRAVENOUS
  Administered 2017-03-04: 3 mL via INTRAVENOUS

## 2017-03-02 MED ORDER — SODIUM CHLORIDE 0.9 % IV SOLN
Freq: Once | INTRAVENOUS | Status: AC
  Administered 2017-03-02: 23:00:00 via INTRAVENOUS

## 2017-03-02 MED ORDER — SODIUM CHLORIDE 0.9 % IV BOLUS (SEPSIS)
500.0000 mL | Freq: Once | INTRAVENOUS | Status: AC
  Administered 2017-03-02: 500 mL via INTRAVENOUS

## 2017-03-02 MED ORDER — THIAMINE HCL 100 MG/ML IJ SOLN
Freq: Once | INTRAVENOUS | Status: AC
  Administered 2017-03-02: 20:00:00 via INTRAVENOUS
  Filled 2017-03-02: qty 1000

## 2017-03-02 NOTE — ED Notes (Signed)
Elevated CG-4 reported to [facility] @ [facility]

## 2017-03-02 NOTE — H&P (Addendum)
History and Physical    [name] DOB: [birth year] DOA: 03/02/2017  Referring MD/NP/PA:   PCP: Denita Lung, MD   Patient coming from:  The patient is coming from home.  At baseline, pt is independent for most of ADL.   Chief Complaint: Generalized weakness  HPI: James [name] is a 61 y.o. male with medical history significant of alcohol abuse, GI bleeding, aortic insufficiency, interstitial lung disease, iron deficiency anemia, tobacco abuse, GERD, who presents with generalized weakness.  Patient states that he has been having generalized weakness for several months, which has been progressively getting worse recently. Patient denies chest pain, shortness breath, nausea, vomiting, diarrhea, abdominal pain, symptoms of UTI. He denies hematuria, hematochezia or hematemesis. Patient states that he injured his right hand. Recently, now has some mild right hip pain.  # Capsule endoscopy by Dr. Hilarie Fredrickson on 09/19/16 was negative. # EGD on 09/19/16 by Dr Benson Norway showed portal hypertension. # Colonoscopy on 08/22/15 by Dr. Michail Sermon showed hemorrhoids.  ED Course: pt was found to have hemoglobin 2.7 which was 11.4 on 10/06/16, negative FOBT, WBC 7.5, lactic acid 4.27-->1.16, INR 1.04, potassium is 3.4, creatinine normal, sodium 128. X-ray of right hip/pelvis is negative for bony fracture. Temperature normal, tachycardia, oxygen saturation 92-100% on room air, blood pressure is soft. Patient is admitted to stepdown as inpatient. PCCM was consulted by EDP--> Dr. Halford Chessman recommended to admit to stepdown.   Review of Systems:   General: no fevers, chills, no changes in body weight, has poor appetite, has fatigue HEENT: no blurry vision, hearing changes or sore throat Respiratory: no dyspnea, coughing, wheezing CV: no chest pain, no palpitations GI: no nausea, vomiting, abdominal pain, diarrhea, constipation GU: no dysuria, burning on urination, increased urinary frequency, hematuria    Ext: no leg edema Neuro: no unilateral weakness, numbness, or tingling, no vision change or hearing loss Skin: no rash, no skin tear. MSK: No muscle spasm, no deformity, no limitation of range of movement in spin. Has right hip pain. Heme: No easy bruising.  Travel history: No recent long distant travel.  Allergy: No Known Allergies  Past Medical History:  Diagnosis Date  . Alcohol abuse   . Anemia due to GI blood loss 09/2011   microcytic.  transfused for Hgb 3.5, MCV in 50s.   . Aortic insufficiency   . Benign neoplasm of colon   . Black stools 08/21/2015  . Cachexia ([place])   . Chest pain 08/21/2015  . Dysphagia   . ED (erectile dysfunction)   . Edema   . GERD (gastroesophageal reflux disease)   . ILD (interstitial lung disease) ([place])   . Iron deficiency anemia, unspecified   . Pulmonary nodule   . Reflux esophagitis   . Smoker   . Tobacco abuse   . Weight loss, abnormal     Past Surgical History:  Procedure Laterality Date  . COLONOSCOPY  10/03/2011   Procedure: COLONOSCOPY;  Surgeon: Scarlette Shorts, MD;  Location: [facility];  Service: Endoscopy;  Laterality: N/A;  . COLONOSCOPY WITH PROPOFOL N/A 08/22/2015   Procedure: COLONOSCOPY WITH PROPOFOL;  Surgeon: Wilford Corner, MD;  Location[facility] ENDOSCOPY;  Service: Endoscopy;  Laterality: N/A;  . ESOPHAGOGASTRODUODENOSCOPY  10/02/2011   Procedure: ESOPHAGOGASTRODUODENOSCOPY (EGD);  Surgeon: Scarlette Shorts, MD;  Location: [facility] ENDOSCOPY;  Service: Endoscopy;  Laterality: N/A;  . ESOPHAGOGASTRODUODENOSCOPY N/A 01/05/2015   Procedure: ESOPHAGOGASTRODUODENOSCOPY (EGD);  Surgeon: Inda Castle, MD;  Location: [facility];  Service: Endoscopy;  Laterality: N/A;  .  ESOPHAGOGASTRODUODENOSCOPY (EGD) WITH PROPOFOL N/A 08/22/2015   Procedure: ESOPHAGOGASTRODUODENOSCOPY (EGD) WITH PROPOFOL;  Surgeon: Wilford Corner, MD;  Location: [facility] ENDOSCOPY;  Service: Endoscopy;  Laterality: N/A;  . ESOPHAGOGASTRODUODENOSCOPY (EGD) WITH PROPOFOL N/A  09/19/2016   Procedure: ESOPHAGOGASTRODUODENOSCOPY (EGD) WITH PROPOFOL;  Surgeon: Carol Ada, MD;  Location: [facility] ENDOSCOPY;  Service: Endoscopy;  Laterality: N/A;  . GIVENS CAPSULE STUDY N/A 09/19/2016   Procedure: GIVENS CAPSULE STUDY;  Surgeon: Carol Ada, MD;  Location: [place];  Service: Endoscopy;  Laterality: N/A;    Social History:  reports that he has been smoking Cigarettes.  He has a 20.00 pack-year smoking history. He has never used smokeless tobacco. He reports that he drinks about 1.2 oz of alcohol per week . He reports that he uses drugs, including Marijuana.  Family History:  Family History  Problem Relation Age of Onset  . Hypertension Mother   . Diabetes Maternal Grandfather      Prior to Admission medications   Medication Sig Start Date End Date Taking? Authorizing Provider  ENSURE (ENSURE) Take 237 mLs by mouth daily.   Yes [provider]  ferrous sulfate 325 (65 FE) MG tablet Take 1 tablet (325 mg total) by mouth 2 (two) times daily with a meal. Patient not taking: Reported on 03/02/2017 09/21/16   Jonetta Osgood, MD  folic acid (FOLVITE) 1 MG tablet Take 1 tablet (1 mg total) by mouth daily. Patient not taking: Reported on 03/02/2017 09/22/16   Jonetta Osgood, MD  pantoprazole (PROTONIX) 40 MG tablet Take 1 tablet (40 mg total) by mouth daily at 6 (six) AM. Patient not taking: Reported on 03/02/2017 09/22/16   Jonetta Osgood, MD  thiamine 100 MG tablet Take 1 tablet (100 mg total) by mouth daily. Patient not taking: Reported on 03/02/2017 09/22/16   Jonetta Osgood, MD    Physical Exam: Vitals:   03/03/17 0038 03/03/17 0054 03/03/17 0100 03/03/17 0215  BP: 113/67 (!) 106/58 115/61 (!) 109/59  Pulse: 97 92 90 83  Resp: 19 18 16 18   Temp: 98.9 F (37.2 C) 99 F (37.2 C)    TempSrc: Oral Oral    SpO2: 100% 100% 100% 100%  Weight:      Height:       General: Not in acute distress. Pale and cachectic looking HEENT:       Eyes: PERRL,  EOMI, no scleral icterus.       ENT: No discharge from the ears and nose, no pharynx injection, no tonsillar enlargement.        Neck: No JVD, no bruit, no mass felt. Heme: No neck lymph node enlargement. Cardiac: S1/S2, RRR, No murmurs, No gallops or rubs. Respiratory: No rales, wheezing, rhonchi or rubs. GI: Soft, nondistended, nontender, no rebound pain, no organomegaly, BS present. GU: No hematuria Ext: No pitting leg edema bilaterally. 2+DP/PT pulse bilaterally. Musculoskeletal: No joint deformities, No joint redness or warmth, no limitation of ROM in spin. Skin: No rashes.  Neuro: Alert, oriented X3, cranial nerves II-XII grossly intact, moves all extremities normally. Psych: Patient is not psychotic, no suicidal or hemocidal ideation.  Labs on Admission: I have personally reviewed following labs and imaging studies  CBC:  Recent Labs Lab 03/02/17 0042 03/02/17 1937 03/02/17 2321  WBC 6.4 7.5  --   NEUTROABS  --  5.3  --   HGB 4.2* 2.7* 5.1*  HCT 14.4* 10.9* 15.0*  MCV 67.0* 60.2*  --   PLT 131* 167  --  Basic Metabolic Panel:  Recent Labs Lab 03/02/17 1838 03/02/17 2321  NA 128* 132*  K 3.4* 3.8  CL 97* 99*  CO2 20*  --   GLUCOSE 83 82  BUN <5* 4*  CREATININE 0.59* 0.40*  CALCIUM 8.3*  --    GFR: Estimated Creatinine Clearance: 76.5 mL/min (A) (by C-G formula based on SCr of 0.4 mg/dL (L)). Liver Function Tests:  Recent Labs Lab 03/02/17 1838  AST 41  ALT 24  ALKPHOS 169*  BILITOT 0.5  PROT 7.4  ALBUMIN 2.9*   No results for input(s): LIPASE, AMYLASE in the last 168 hours. No results for input(s): AMMONIA in the last 168 hours. Coagulation Profile:  Recent Labs Lab 03/02/17 1939  INR 1.06   Cardiac Enzymes: No results for input(s): CKTOTAL, CKMB, CKMBINDEX, TROPONINI in the last 168 hours. BNP (last 3 results) No results for input(s): PROBNP in the last 8760 hours. HbA1C: No results for input(s): HGBA1C in the last 72  hours. CBG:  Recent Labs Lab 03/02/17 1751  GLUCAP 90   Lipid Profile: No results for input(s): CHOL, HDL, LDLCALC, TRIG, CHOLHDL, LDLDIRECT in the last 72 hours. Thyroid Function Tests: No results for input(s): TSH, T4TOTAL, FREET4, T3FREE, THYROIDAB in the last 72 hours. Anemia Panel:  Recent Labs  03/02/17 2256  TIBC 335  IRON 9*  RETICCTPCT 1.0   Urine analysis:    Component Value Date/Time   COLORURINE YELLOW 03/02/2017 2256   APPEARANCEUR CLEAR 03/02/2017 2256   LABSPEC 1.009 03/02/2017 2256   PHURINE 6.0 03/02/2017 2256   GLUCOSEU NEGATIVE 03/02/2017 2256   HGBUR NEGATIVE 03/02/2017 2256   BILIRUBINUR NEGATIVE 03/02/2017 2256   KETONESUR NEGATIVE 03/02/2017 2256   PROTEINUR NEGATIVE 03/02/2017 2256   NITRITE NEGATIVE 03/02/2017 2256   LEUKOCYTESUR NEGATIVE 03/02/2017 2256   Sepsis Labs: @LABRCNTIP (procalcitonin:4,lacticidven:4) )No results found for this or any previous visit (from the past 240 hour(s)).   Radiological Exams on Admission: Dg Hip Unilat W Or Wo Pelvis 2-3 Views Right  Result Date: 03/03/2017 CLINICAL DATA:  Right hip pain tonight. Fell approximately 2 weeks ago. EXAM: DG HIP (WITH OR WITHOUT PELVIS) 2-3V RIGHT COMPARISON:  None. FINDINGS: There is no evidence of hip fracture or dislocation. There is no evidence of arthropathy or other focal bone abnormality. There are extensive vascular calcifications. IMPRESSION: Negative for fracture or dislocation. Extensive vascular calcifications. Electronically Signed   By: Andreas Newport M.D.   On: 03/03/2017 00:40     EKG: Independently reviewed. Sinus rhythm, QTC 433, tachycardia, T-wave inversion in V2    Assessment/Plan Principal Problem:   Symptomatic anemia Active Problems:   Tobacco abuse   Alcohol abuse   Severe protein-calorie malnutrition (HCC)   Hypokalemia   Hyponatremia   Right hip pain   Symptomatic anemia: hgb dropped from 11.4 to 2.7. His tachycardia and a soft blood  pressure, but hemodynamically stable. Lactic acid is normal. Patient denies chest pain or shortness of breath. FOBT is negative on admission. Patient denies hematuria, hematochezia or hematemesis. Etiology is not clear, it is possible that patient had GI bleeding which patient did not realize. Bone marrow suppression due to heavy alcohol abuse is also possible.  - will admit to SDU as inpt - transfuse 4 units of blood - IVF: 1.5 L NS bolus - Start IV pantoprazole gtt - Zofran IV for nausea - Avoid NSAIDs and SQ heparin - Maintain IV access (2 large bore IVs if possible). - Monitor closely and follow q6h cbc, transfuse  as necessary. - f/u LDH, peripheral smear, haptoglobin, iron study  Tobacco abuse and Alcohol abuse: -Did counseling about importance of quitting smoking -Nicotine patch -Did counseling about the importance of quitting drinking -CIWA protocol  Severe protein-calorie malnutrition: -nutrition consult  Hyponatremia: likely due to poor oral intake, dehydration, potomania - Will check urine sodium, urine osmolality, serum osmolality. - check TSH - IVF: as above - check BMP q6h  Hypokalemia: K=3.4 -will not replete now since pt is getting blood transfusion   Right hip pain: no bony fracture by X-ray -prn tylenol   DVT ppx: SCD Code Status: Full code Family Communication: None at bed side.   Disposition Plan:  Anticipate discharge back to previous home environment Consults called:  none Admission status: SDU/inpation       Date of Service 03/03/2017    Ivor Costa [facility] Pager [phone]  If 7PM-7AM, please contact night-coverage [email or web address] Password [facility] 03/03/2017, 3:28 AM

## 2017-03-02 NOTE — ED Notes (Signed)
MD aware of pt hgb.

## 2017-03-02 NOTE — ED Notes (Addendum)
Date and time results received: 03/02/17 1834 (use smartphrase ".now" to insert current time)  Test: I-stat lactic acid Critical Value: 4.27  Name of Provider Notified: dr Ellender Hose  Orders Received? Or Actions Taken?: roomed

## 2017-03-02 NOTE — ED Triage Notes (Signed)
Pt states he fell a couple months ago and has had persistent weakness. Pt alert and oriented but appears very unwell. He is pale and emaciated. Vital signs stable.

## 2017-03-02 NOTE — ED Notes (Signed)
Pt transported to XR. Will hang second unit of RBC when pt returns from XR.

## 2017-03-02 NOTE — ED Notes (Signed)
XR unable to transport pt with blood products hanging unless accompanied by a nurse. Pt RBC transfusion almost complete, will call XR to transport when blood is finished.

## 2017-03-02 NOTE — ED Provider Notes (Signed)
[facility] DEPT Provider Note   CSN: [id number] Arrival date & time: 03/02/17  1651     History   Chief Complaint Chief Complaint  Patient presents with  . Fall    HPI James [name] is a 61 y.o. male.  HPI   61 yo M with PMHx chronic alcoholism, recurrent blood loss anemia, here with fatigue and falls. Pt reports that he recently resumed drinking approx 1 month ago, [birth year] his birthday. Since then, he has been drinking alcohol consistently. He has developed associated progressively worsening fatigue. He has fallen multiple times due to this fatigue and is having a difficult time even walking due to being so tired. He subsequently told his family who convinced him to report to the ED. Denies any pain. No CP, abdominal pain, n/v. Denies melena or hematochezia. He is not on blood thinners.  Past Medical History:  Diagnosis Date  . Alcohol abuse   . Anemia due to GI blood loss 09/2011   microcytic.  transfused for Hgb 3.5, MCV in 50s.   . Aortic insufficiency   . Benign neoplasm of colon   . Black stools 08/21/2015  . Cachexia ([place])   . Chest pain 08/21/2015  . Dysphagia   . ED (erectile dysfunction)   . Edema   . GERD (gastroesophageal reflux disease)   . ILD (interstitial lung disease) ([place])   . Iron deficiency anemia, unspecified   . Pulmonary nodule   . Reflux esophagitis   . Smoker   . Tobacco abuse   . Weight loss, abnormal     Patient Active Problem List   Diagnosis Date Noted  . Hypokalemia 03/02/2017  . Symptomatic anemia 03/02/2017  . Hyponatremia 03/02/2017  . Right hip pain 03/02/2017  . Lactic acidosis   . History of peptic ulcer disease 10/06/2016  . Poor dentition 10/06/2016  . Iron deficiency anemia due to chronic blood loss 09/17/2016  . Alcoholic gastritis with hemorrhage   . Marijuana abuse   . Severe protein-calorie malnutrition ([place]) 01/02/2015  . ILD (interstitial lung disease) ([place]) 10/24/2011  . Pulmonary nodule 10/24/2011  . Cachexia  ([place]) 10/24/2011  . Benign neoplasm of colon 10/03/2011  . Anemia due to GI blood loss 10/01/2011  . Tobacco abuse 10/01/2011  . Alcohol abuse 10/01/2011  . Dysphagia, unspecified(787.20) 10/01/2011    Past Surgical History:  Procedure Laterality Date  . COLONOSCOPY  10/03/2011   Procedure: COLONOSCOPY;  Surgeon: Scarlette Shorts, MD;  Location: [facility];  Service: Endoscopy;  Laterality: N/A;  . COLONOSCOPY WITH PROPOFOL N/A 08/22/2015   Procedure: COLONOSCOPY WITH PROPOFOL;  Surgeon: Wilford Corner, MD;  Location: [facility] ENDOSCOPY;  Service: Endoscopy;  Laterality: N/A;  . ESOPHAGOGASTRODUODENOSCOPY  10/02/2011   Procedure: ESOPHAGOGASTRODUODENOSCOPY (EGD);  Surgeon: Scarlette Shorts, MD;  Location: [facility] ENDOSCOPY;  Service: Endoscopy;  Laterality: N/A;  . ESOPHAGOGASTRODUODENOSCOPY N/A 01/05/2015   Procedure: ESOPHAGOGASTRODUODENOSCOPY (EGD);  Surgeon: Inda Castle, MD;  Location: [facility];  Service: Endoscopy;  Laterality: N/A;  . ESOPHAGOGASTRODUODENOSCOPY (EGD) WITH PROPOFOL N/A 08/22/2015   Procedure: ESOPHAGOGASTRODUODENOSCOPY (EGD) WITH PROPOFOL;  Surgeon: Wilford Corner, MD;  Location: [facility] ENDOSCOPY;  Service: Endoscopy;  Laterality: N/A;  . ESOPHAGOGASTRODUODENOSCOPY (EGD) WITH PROPOFOL N/A 09/19/2016   Procedure: ESOPHAGOGASTRODUODENOSCOPY (EGD) WITH PROPOFOL;  Surgeon: Carol Ada, MD;  Location: [facility] ENDOSCOPY;  Service: Endoscopy;  Laterality: N/A;  . GIVENS CAPSULE STUDY N/A 09/19/2016   Procedure: GIVENS CAPSULE STUDY;  Surgeon: Carol Ada, MD;  Location: [facility];  Service: Endoscopy;  Laterality: N/A;  Home Medications    Prior to Admission medications   Medication Sig Start Date End Date Taking? Authorizing Provider  ENSURE (ENSURE) Take 237 mLs by mouth daily.   Yes [provider]  ferrous sulfate 325 (65 FE) MG tablet Take 1 tablet (325 mg total) by mouth 2 (two) times daily with a meal. Patient not taking: Reported on 03/02/2017 09/21/16   Jonetta Osgood, MD  folic acid (FOLVITE) 1 MG tablet Take 1 tablet (1 mg total) by mouth daily. Patient not taking: Reported on 03/02/2017 09/22/16   Jonetta Osgood, MD  pantoprazole (PROTONIX) 40 MG tablet Take 1 tablet (40 mg total) by mouth daily at 6 (six) AM. Patient not taking: Reported on 03/02/2017 09/22/16   Jonetta Osgood, MD  thiamine 100 MG tablet Take 1 tablet (100 mg total) by mouth daily. Patient not taking: Reported on 03/02/2017 09/22/16   Jonetta Osgood, MD    Family History Family History  Problem Relation Age of Onset  . Hypertension Mother   . Diabetes Maternal Grandfather     Social History Social History  Substance Use Topics  . Smoking status: Current Every Day Smoker    Packs/day: 1.00    Years: 20.00    Types: Cigarettes  . Smokeless tobacco: Never Used     Comment: pt states he is down to 7 cigs per day  . Alcohol use 1.2 oz/week    2 Cans of beer per week     Allergies   Patient has no known allergies.   Review of Systems Review of Systems  Constitutional: Positive for fatigue. Negative for fever.  Respiratory: Positive for shortness of breath.   Neurological: Positive for weakness and light-headedness.  All other systems reviewed and are negative.    Physical Exam Updated Vital Signs BP 102/67   Pulse 93   Temp 98.5 F (36.9 C) (Oral)   Resp 17   Ht 5\' 9"  (1.753 m)   Wt 54.4 kg (120 lb)   SpO2 100%   BMI 17.72 kg/m   Physical Exam  Constitutional: He is oriented to person, place, and time. He appears well-developed and well-nourished. No distress.  Thin, cachectic, appears older than stated age  HENT:  Head: Normocephalic and atraumatic.  Subconjunctival pallor  Eyes: Conjunctivae are normal.  Neck: Neck supple.  Cardiovascular: Normal rate, regular rhythm and normal heart sounds.  Exam reveals no friction rub.   No murmur heard. Pulmonary/Chest: Effort normal and breath sounds normal. No respiratory distress. He has no  wheezes. He has no rales.  Abdominal: He exhibits no distension.  Genitourinary:  Genitourinary Comments: Soft brown stool in rectal vault. Hemoccult neg.  Musculoskeletal: He exhibits no edema.  Neurological: He is alert and oriented to person, place, and time. He exhibits normal muscle tone.  Skin: Skin is warm. Capillary refill takes less than 2 seconds.  Psychiatric: He has a normal mood and affect.  Nursing note and vitals reviewed.    ED Treatments / Results  Labs (all labs ordered are listed, but only abnormal results are displayed) Labs Reviewed  COMPREHENSIVE METABOLIC PANEL - Abnormal; Notable for the following:       Result Value   Sodium 128 (*)    Potassium 3.4 (*)    Chloride 97 (*)    CO2 20 (*)    BUN <5 (*)    Creatinine, Ser 0.59 (*)    Calcium 8.3 (*)    Albumin 2.9 (*)  Alkaline Phosphatase 169 (*)    All other components within normal limits  CBC WITH DIFFERENTIAL/PLATELET - Abnormal; Notable for the following:    RBC 1.81 (*)    Hemoglobin 2.7 (*)    HCT 10.9 (*)    MCV 60.2 (*)    MCH 14.9 (*)    MCHC 24.8 (*)    RDW 21.7 (*)    All other components within normal limits  I-STAT CG4 LACTIC ACID, ED - Abnormal; Notable for the following:    Lactic Acid, Venous 4.27 (*)    All other components within normal limits  PROTIME-INR  URINALYSIS, ROUTINE W REFLEX MICROSCOPIC  RETICULOCYTES  IRON AND TIBC  SAVE SMEAR  CBC  CBC  CBC  PATHOLOGIST SMEAR REVIEW  LACTATE DEHYDROGENASE  HAPTOGLOBIN  TSH  OSMOLALITY, URINE  OSMOLALITY  SODIUM, URINE, RANDOM  BASIC METABOLIC PANEL  BASIC METABOLIC PANEL  RAPID URINE DRUG SCREEN, HOSP PERFORMED  HIV ANTIBODY (ROUTINE TESTING)  BASIC METABOLIC PANEL  CBG MONITORING, ED  I-STAT CHEM 8, ED  POC OCCULT BLOOD, ED  I-STAT CG4 LACTIC ACID, ED  TYPE AND SCREEN  PREPARE RBC (CROSSMATCH)    EKG  EKG Interpretation None       Radiology No results found.  Procedures .Critical Care Performed by:  Duffy Bruce Authorized by: Duffy Bruce   Critical care provider statement:    Critical care time (minutes):  35    (including critical care time)  CRITICAL CARE Performed by: Evonnie Pat   Total critical care time: 35 minutes  Critical care time was exclusive of separately billable procedures and treating other patients.  Critical care was necessary to treat or prevent imminent or life-threatening deterioration.  Critical care was time spent personally by me on the following activities: development of treatment plan with patient and/or surrogate as well as nursing, discussions with consultants, evaluation of patient's response to treatment, examination of patient, obtaining history from patient or surrogate, ordering and performing treatments and interventions, ordering and review of laboratory studies, ordering and review of radiographic studies, pulse oximetry and re-evaluation of patient's condition.   Medications Ordered in ED Medications  pantoprazole (PROTONIX) 80 mg in sodium chloride 0.9 % 100 mL IVPB (80 mg Intravenous New Bag/Given 03/02/17 2308)  pantoprazole (PROTONIX) 80 mg in sodium chloride 0.9 % 250 mL (0.32 mg/mL) infusion (not administered)  calcium gluconate 1 g in sodium chloride 0.9 % 100 mL IVPB (not administered)  0.9 %  sodium chloride infusion (not administered)  nicotine (NICODERM CQ - dosed in mg/24 hours) patch 21 mg (not administered)  albuterol (PROVENTIL) (2.5 MG/3ML) 0.083% nebulizer solution 2.5 mg (not administered)  LORazepam (ATIVAN) tablet 1 mg (not administered)    Or  LORazepam (ATIVAN) injection 1 mg (not administered)  thiamine (VITAMIN B-1) tablet 100 mg (not administered)    Or  thiamine (B-1) injection 100 mg (not administered)  folic acid (FOLVITE) tablet 1 mg (not administered)  multivitamin with minerals tablet 1 tablet (not administered)  LORazepam (ATIVAN) injection 0-4 mg (not administered)    Followed by  LORazepam  (ATIVAN) injection 0-4 mg (not administered)  sodium chloride flush (NS) 0.9 % injection 3 mL (not administered)  ondansetron (ZOFRAN) tablet 4 mg (not administered)    Or  ondansetron (ZOFRAN) injection 4 mg (not administered)  sodium chloride 0.9 % bolus 500 mL (0 mLs Intravenous Stopped 03/02/17 2038)  sodium chloride 0.9 % 1,000 mL with thiamine 947 mg, folic acid 1 mg, multivitamins adult 10  mL infusion ( Intravenous New Bag/Given 03/02/17 2026)  0.9 %  sodium chloride infusion ( Intravenous New Bag/Given 03/02/17 2310)     Initial Impression / Assessment and Plan / ED Course  I have reviewed the triage vital signs and the nursing notes.  Pertinent labs & imaging results that were available during my care of the patient were reviewed by me and considered in my medical decision making (see chart for details).     61 year old male with past medical history of chronic alcoholism, recurrent upper GI bleed as well as chronic iron deficiency anemia due to malnutrition here with fall and generalized weakness. The patient is here today with generalized weakness in the setting of alcohol relapse. He has a significant elevated lactic acidosis as well as mild dehydration on CMP. BUN is less than 5, but clinically I'm most concerned about potential recurrence of alcoholic gastritis versus bleed. Will check Hemoccult, CBC, and type and cross. He has no hypertension, no fevers, and I believe his lactic acidosis is more so secondary to his malnutrition, lack of by mouth intake, as well as possibly type II lactic acidosis due to decreased liver clearance from possible alcoholic liver disease.  Hgb 2.7. Hemoccult negative, however. MCV markedly low. Much of this may be 2/2 acute on chronic anemia from IDA, chronic malnutrition, also must consider aplastic/marrow deficiency. Will admit for transfusion and w/u. Pt in agreement. He remains HDS.   Final Clinical Impressions(s) / ED Diagnoses   Final diagnoses:    Severe anemia  Low mean corpuscular volume (MCV)  Lactic acidosis    New Prescriptions New Prescriptions   No medications on file     Duffy Bruce, MD 03/02/17 2317

## 2017-03-02 NOTE — ED Notes (Signed)
XR called to transport pt.

## 2017-03-02 NOTE — ED Notes (Signed)
This nurse consulted with pharmacy, calcium gluconate and protonix compatible to be hung together.

## 2017-03-03 ENCOUNTER — Inpatient Hospital Stay (HOSPITAL_COMMUNITY): Payer: BC Managed Care – PPO

## 2017-03-03 ENCOUNTER — Encounter (HOSPITAL_COMMUNITY): Payer: Self-pay | Admitting: General Practice

## 2017-03-03 DIAGNOSIS — D509 Iron deficiency anemia, unspecified: Secondary | ICD-10-CM

## 2017-03-03 DIAGNOSIS — Z86010 Personal history of colonic polyps: Secondary | ICD-10-CM | POA: Diagnosis not present

## 2017-03-03 DIAGNOSIS — K2970 Gastritis, unspecified, without bleeding: Secondary | ICD-10-CM | POA: Diagnosis not present

## 2017-03-03 DIAGNOSIS — K766 Portal hypertension: Secondary | ICD-10-CM | POA: Diagnosis not present

## 2017-03-03 DIAGNOSIS — K3189 Other diseases of stomach and duodenum: Secondary | ICD-10-CM | POA: Diagnosis not present

## 2017-03-03 DIAGNOSIS — K2990 Gastroduodenitis, unspecified, without bleeding: Secondary | ICD-10-CM | POA: Diagnosis not present

## 2017-03-03 DIAGNOSIS — D649 Anemia, unspecified: Secondary | ICD-10-CM | POA: Diagnosis not present

## 2017-03-03 LAB — BASIC METABOLIC PANEL
ANION GAP: 6 (ref 5–15)
Anion gap: 7 (ref 5–15)
Anion gap: 4 — ABNORMAL LOW (ref 5–15)
BUN: 5 mg/dL — AB (ref 6–20)
CALCIUM: 8.2 mg/dL — AB (ref 8.9–10.3)
CALCIUM: 8.1 mg/dL — AB (ref 8.9–10.3)
CALCIUM: 8.1 mg/dL — AB (ref 8.9–10.3)
CHLORIDE: 106 mmol/L (ref 101–111)
CO2: 21 mmol/L — AB (ref 22–32)
CO2: 18 mmol/L — ABNORMAL LOW (ref 22–32)
CO2: 19 mmol/L — ABNORMAL LOW (ref 22–32)
CREATININE: 0.7 mg/dL (ref 0.61–1.24)
CREATININE: 0.71 mg/dL (ref 0.61–1.24)
Chloride: 106 mmol/L (ref 101–111)
Chloride: 105 mmol/L (ref 101–111)
Creatinine, Ser: 0.65 mg/dL (ref 0.61–1.24)
GFR calc Af Amer: >60 mL/min (ref >60)
GFR calc non Af Amer: >60 mL/min (ref >60)
Glucose, Bld: 77 mg/dL (ref 65–99)
Glucose, Bld: 116 mg/dL — ABNORMAL HIGH (ref 65–99)
Glucose, Bld: 120 mg/dL — ABNORMAL HIGH (ref 65–99)
POTASSIUM: 3.7 mmol/L (ref 3.5–5.1)
Potassium: 3.2 mmol/L — ABNORMAL LOW (ref 3.5–5.1)
Potassium: 3 mmol/L — ABNORMAL LOW (ref 3.5–5.1)
SODIUM: 130 mmol/L — AB (ref 135–145)
SODIUM: 129 mmol/L — AB (ref 135–145)
Sodium: 133 mmol/L — ABNORMAL LOW (ref 135–145)

## 2017-03-03 LAB — CBC
HCT: 26.2 % — ABNORMAL LOW (ref 39.0–52.0)
HCT: 27.8 % — ABNORMAL LOW (ref 39.0–52.0)
HEMATOCRIT: 25.8 % — AB (ref 39.0–52.0)
HEMATOCRIT: 14.4 % — AB (ref 39.0–52.0)
HEMOGLOBIN: 8.1 g/dL — AB (ref 13.0–17.0)
Hemoglobin: 4.2 g/dL — CL (ref 13.0–17.0)
Hemoglobin: 8.2 g/dL — ABNORMAL LOW (ref 13.0–17.0)
Hemoglobin: 8.7 g/dL — ABNORMAL LOW (ref 13.0–17.0)
MCH: 23.2 pg — ABNORMAL LOW (ref 26.0–34.0)
MCH: 22.9 pg — ABNORMAL LOW (ref 26.0–34.0)
MCH: 19.5 pg — AB (ref 26.0–34.0)
MCH: 23.4 pg — AB (ref 26.0–34.0)
MCHC: 31.4 g/dL (ref 30.0–36.0)
MCHC: 31.3 g/dL (ref 30.0–36.0)
MCHC: 29.2 g/dL — AB (ref 30.0–36.0)
MCHC: 31.3 g/dL (ref 30.0–36.0)
MCV: 74.9 fL — ABNORMAL LOW (ref 78.0–100.0)
MCV: 74.1 fL — ABNORMAL LOW (ref 78.0–100.0)
MCV: 73.1 fL — ABNORMAL LOW (ref 78.0–100.0)
MCV: 67 fL — AB (ref 78.0–100.0)
PLATELETS: 131 10*3/uL — AB (ref 150–400)
Platelets: 106 10*3/uL — ABNORMAL LOW (ref 150–400)
Platelets: 110 10*3/uL — ABNORMAL LOW (ref 150–400)
Platelets: 114 10*3/uL — ABNORMAL LOW (ref 150–400)
RBC: 2.15 MIL/uL — ABNORMAL LOW (ref 4.22–5.81)
RBC: 3.5 MIL/uL — ABNORMAL LOW (ref 4.22–5.81)
RBC: 3.53 MIL/uL — AB (ref 4.22–5.81)
RBC: 3.75 MIL/uL — AB (ref 4.22–5.81)
RDW: 22.5 % — ABNORMAL HIGH (ref 11.5–15.5)
RDW: 22.8 % — AB (ref 11.5–15.5)
RDW: 23.1 % — AB (ref 11.5–15.5)
RDW: 25.4 % — AB (ref 11.5–15.5)
WBC: 6.4 10*3/uL (ref 4.0–10.5)
WBC: 6.2 10*3/uL (ref 4.0–10.5)
WBC: 7.1 10*3/uL (ref 4.0–10.5)
WBC: 6.6 10*3/uL (ref 4.0–10.5)

## 2017-03-03 LAB — IRON AND TIBC
IRON: 9 ug/dL — AB (ref 45–182)
Saturation Ratios: 3 % — ABNORMAL LOW (ref 17.9–39.5)
TIBC: 335 ug/dL (ref 250–450)
UIBC: 326 ug/dL

## 2017-03-03 LAB — SAVE SMEAR

## 2017-03-03 LAB — RAPID URINE DRUG SCREEN, HOSP PERFORMED
AMPHETAMINES: NOT DETECTED
BARBITURATES: NOT DETECTED
BENZODIAZEPINES: NOT DETECTED
Cocaine: NOT DETECTED
Opiates: NOT DETECTED
Tetrahydrocannabinol: POSITIVE — AB

## 2017-03-03 LAB — HIV ANTIBODY (ROUTINE TESTING W REFLEX): HIV SCREEN 4TH GENERATION: NONREACTIVE

## 2017-03-03 LAB — TSH: TSH: 2.727 u[IU]/mL (ref 0.350–4.500)

## 2017-03-03 LAB — LACTATE DEHYDROGENASE: LDH: 115 U/L (ref 98–192)

## 2017-03-03 LAB — OSMOLALITY, URINE: OSMOLALITY UR: 252 mosm/kg — AB (ref 300–900)

## 2017-03-03 LAB — OSMOLALITY: OSMOLALITY: 272 mosm/kg — AB (ref 275–295)

## 2017-03-03 LAB — MAGNESIUM: Magnesium: 2 mg/dL (ref 1.7–2.4)

## 2017-03-03 LAB — MRSA PCR SCREENING: MRSA BY PCR: NEGATIVE

## 2017-03-03 MED ORDER — CALCIUM CARBONATE ANTACID 500 MG PO CHEW
1.0000 | CHEWABLE_TABLET | Freq: Three times a day (TID) | ORAL | Status: DC
  Administered 2017-03-03 – 2017-03-04 (×5): 200 mg via ORAL
  Filled 2017-03-03 (×5): qty 1

## 2017-03-03 MED ORDER — PANTOPRAZOLE SODIUM 40 MG PO TBEC
40.0000 mg | DELAYED_RELEASE_TABLET | Freq: Two times a day (BID) | ORAL | Status: DC
  Administered 2017-03-03 – 2017-03-04 (×3): 40 mg via ORAL
  Filled 2017-03-03 (×3): qty 1

## 2017-03-03 MED ORDER — THIAMINE HCL 100 MG/ML IJ SOLN
Freq: Once | INTRAVENOUS | Status: AC
  Administered 2017-03-03: 11:00:00 via INTRAVENOUS
  Filled 2017-03-03: qty 1000

## 2017-03-03 MED ORDER — ACETAMINOPHEN 325 MG PO TABS: 650.0000 mg | ORAL_TABLET | Freq: Four times a day (QID) | ORAL | Status: DC | PRN

## 2017-03-03 MED ORDER — ENSURE ENLIVE PO LIQD
237.0000 mL | Freq: Three times a day (TID) | ORAL | Status: DC
  Administered 2017-03-03 – 2017-03-04 (×4): 237 mL via ORAL

## 2017-03-03 MED ORDER — SODIUM CHLORIDE 0.9 % IV SOLN
INTRAVENOUS | Status: DC
  Administered 2017-03-04 (×2): via INTRAVENOUS

## 2017-03-03 NOTE — Plan of Care (Signed)
Problem: Education: Goal: Knowledge of [facility] Education information/materials will improve Outcome: Completed/Met Date Met: 03/03/17 Pt educated on the pain scale, the importance of hand hygiene, his fall risk & the steps we're taking to prevent a fall while he's here. Pt educated & given the patient information guide. Pt also educated on his current POC. Pt oriented to the room as well.

## 2017-03-03 NOTE — ED Notes (Signed)
Phlebotomy drawing labs post transfusion - prior to transporting patient to [facility] [place] for inpatient.

## 2017-03-03 NOTE — ED Notes (Signed)
Per Dr. Donna Bernard do not draw morning labs until 1 hour post 4th Unit of [facility] transfusion.  Pathology spear review is not to be drawn until later in the day 6/26 due to transfusions.

## 2017-03-03 NOTE — Consult Note (Signed)
[facility] Consult: 11:28 AM 03/03/2017  LOS: 1 day    Referring Provider: Dr Verlon Au  Primary Care Physician:  Denita Lung, MD Primary Gastroenterologist:  Althia Forts.      Reason for Consultation:  Microcytic anemia.     HPI: James [name] is a 61 y.o. male.  Alcoholic (often a 6 pack per day, 1/5th spirits per week).  Interstitial lung disease (1 PPD smoker).  Fatty liver and stable gastric wall thickening per 08/2015 CT. Chronic microcytic anemia with transfusion requiring recurrent acute, blood loss, anemia to Hgbs as low as 2.7 in 09/2016.  At best Hgb is 7.5 to 8.5.   2013 colon and EGD: mild esophagitis, esophageal stricture (not dilated).  Three 12 to 15 mm polyps, scattered tics, internal rrhoids. Polyp path: tubular adenomas, 1 at rectum with HGD.   01/05/2015 EGD, Dr Deatra Ina.  For anemia.  Soft, enlarged gastric folds.  Biopsied: H Pylori gastritis.   08/22/15 EGD, Dr Michail Sermon. For GI bleed.  Large, cratered, clean-based, non-bleeding GU.  Small HH.  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 08/22/15 Colonoscopy, Dr Michail Sermon. For GI bleed.  Poor prep, required frequent lavage.  Found int hemorrhoids, no polyps, no bleeding or blood.  09/19/16 EGD for melena, anemia: portal hypertensive gastropathy.   No biopsy.  09/19/16 Capsule Endoscopy, Dr Benson Norway for profound anemia and melena: Healed GU.  Non-bleeding portal hypertensive gastropathy.    In 09/2016 H Pylori Ab was elevated.  He had completed ABx for H pylori RXd by Dr Redmond School during the 09/2016 admission.  In 09/2016 discharged on BID iron, daily Protonix 40 mg. When this ran out, he never got refills so assume he only took this for 30 days.    For 10 days or so, noticing less energy and  weakness.  Sunday had change of stools from brown to black, not tarry or grossly bloody.   Only 1 time and no recurrence yesterday or today.  Not taking ASA, NSAIDs.  No anorexia, abd pain, n/v, weight loss.  Became more fatigued, but no chest pain or palpitations.    Came to ED yesterday c/o weakness, right hip pain.    Hgb 2.7 >> 8.2 after PRBC x 4. MCV 60.  Low iron and TIBC, Ferritin not checked (was 4 in 09/2016) Platelets 110, new issue for him.   Coags normal. LFTs normal except low albumin  FOBT is negative.  Tox screen + for THC.      Past Medical History:  Diagnosis Date  . Alcohol abuse   . Anemia due to GI blood loss 09/2011   microcytic.  transfused for Hgb 3.5, MCV in 50s.   . Aortic insufficiency   . Benign neoplasm of colon   . Black stools 08/21/2015  . Cachexia ([place])   . Chest pain 08/21/2015  . Dysphagia   . ED (erectile dysfunction)   . Edema   . GERD (gastroesophageal reflux disease)   . ILD (interstitial lung disease) ([place])   . Iron deficiency anemia, unspecified   .  Pulmonary nodule   . Reflux esophagitis   . Smoker   . Tobacco abuse   . Weight loss, abnormal     Past Surgical History:  Procedure Laterality Date  . COLONOSCOPY  10/03/2011   Procedure: COLONOSCOPY;  Surgeon: Scarlette Shorts, MD;  Location: [place];  Service: Endoscopy;  Laterality: N/A;  . COLONOSCOPY WITH PROPOFOL N/A 08/22/2015   Procedure: COLONOSCOPY WITH PROPOFOL;  Surgeon: Wilford Corner, MD;  Location: [facility] ENDOSCOPY;  Service: Endoscopy;  Laterality: N/A;  . ESOPHAGOGASTRODUODENOSCOPY  10/02/2011   Procedure: ESOPHAGOGASTRODUODENOSCOPY (EGD);  Surgeon: Scarlette Shorts, MD;  Location: [facility] ENDOSCOPY;  Service: Endoscopy;  Laterality: N/A;  . ESOPHAGOGASTRODUODENOSCOPY N/A 01/05/2015   Procedure: ESOPHAGOGASTRODUODENOSCOPY (EGD);  Surgeon: Inda Castle, MD;  Location: [place];  Service: Endoscopy;  Laterality: N/A;  . ESOPHAGOGASTRODUODENOSCOPY (EGD) WITH PROPOFOL N/A 08/22/2015    Procedure: ESOPHAGOGASTRODUODENOSCOPY (EGD) WITH PROPOFOL;  Surgeon: Wilford Corner, MD;  Location[facility] ENDOSCOPY;  Service: Endoscopy;  Laterality: N/A;  . ESOPHAGOGASTRODUODENOSCOPY (EGD) WITH PROPOFOL N/A 09/19/2016   Procedure: ESOPHAGOGASTRODUODENOSCOPY (EGD) WITH PROPOFOL;  Surgeon: Carol Ada, MD;  Location: [facility] ENDOSCOPY;  Service: Endoscopy;  Laterality: N/A;  . GIVENS CAPSULE STUDY N/A 09/19/2016   Procedure: GIVENS CAPSULE STUDY;  Surgeon: Carol Ada, MD;  Location: [place];  Service: Endoscopy;  Laterality: N/A;    Prior to Admission medications   Medication Sig Start Date End Date Taking? Authorizing Provider  ENSURE (ENSURE) Take 237 mLs by mouth daily.   Yes [provider]  ferrous sulfate 325 (65 FE) MG tablet Take 1 tablet (325 mg total) by mouth 2 (two) times daily with a meal. Patient not taking: Reported on 03/02/2017 09/21/16   Jonetta Osgood, MD  folic acid (FOLVITE) 1 MG tablet Take 1 tablet (1 mg total) by mouth daily. Patient not taking: Reported on 03/02/2017 09/22/16   Jonetta Osgood, MD  pantoprazole (PROTONIX) 40 MG tablet Take 1 tablet (40 mg total) by mouth daily at 6 (six) AM. Patient not taking: Reported on 03/02/2017 09/22/16   Jonetta Osgood, MD  thiamine 100 MG tablet Take 1 tablet (100 mg total) by mouth daily. Patient not taking: Reported on 03/02/2017 09/22/16   Jonetta Osgood, MD    Scheduled Meds: . calcium carbonate  1 tablet Oral TID  . folic acid  1 mg Oral Daily  . LORazepam  0-4 mg Intravenous Q6H   Followed by  . [START ON 03/05/2017] LORazepam  0-4 mg Intravenous Q12H  . multivitamin with minerals  1 tablet Oral Daily  . nicotine  21 mg Transdermal Daily  . pantoprazole  40 mg Oral BID  . sodium chloride flush  3 mL Intravenous Q12H  . thiamine  100 mg Oral Daily   Or  . thiamine  100 mg Intravenous Daily   Infusions: . sodium chloride     PRN Meds: acetaminophen, albuterol, LORazepam **OR** LORazepam,  ondansetron **OR** ondansetron (ZOFRAN) IV   Allergies as of 03/02/2017  . (No Known Allergies)    Family History  Problem Relation Age of Onset  . Hypertension Mother   . Diabetes Maternal Grandfather     Social History   Social History  . Marital status: Married    Spouse name: N/A  . Number of children: N/A  . Years of education: N/A   Occupational History  . unemployed Unemployed   Social History Main Topics  . Smoking status: Current Every Day Smoker    Packs/day: 1.00  Years: 20.00    Types: Cigarettes  . Smokeless tobacco: Never Used     Comment: pt states he is down to 7 cigs per day  . Alcohol use 1.2 oz/week    2 Cans of beer per week  . Drug use: Yes    Types: Marijuana  . Sexual activity: Not Currently   Other Topics Concern  . Not on file   Social History Narrative  . No narrative on file    REVIEW OF SYSTEMS: Constitutional:  Per HPI ENT:  No nose bleeds Pulm:  Per HPI CV:  No palpitations, no LE edema.  GU:  No hematuria, no frequency GI:  Per HPI.  No dysphagia Heme:  No unusual bleeding or bruising   Transfusions:  X 4.  Also in past Neuro:  No headaches, no peripheral tingling or numbness, no seizure Derm:  No itching, no rash or sores.  Endocrine:  No sweats or chills.  No polyuria or dysuria Immunization:  Not queried.  Td in 09/2016 Travel:  None beyond local counties in last few months.    PHYSICAL EXAM: Vital signs in last 24 hours: Vitals:   03/03/17 0945 03/03/17 1000  BP: 125/64 120/64  Pulse: 82 74  Resp: 18 19  Temp:     Wt Readings from Last 3 Encounters:  03/03/17 48.6 kg (107 lb 2.3 oz)  10/06/16 49.9 kg (110 lb)  09/20/16 47.5 kg (104 lb 11.5 oz)    General: thin, alert, comfortable.  Somewhat frail AAM Head:  No asymmetry or facial edmea  Eyes:  No icterus.  Conj pale. Ears:  Slightly HOH  Nose:  No discharge or congestion Mouth:  Moist, clear oral MM, tongue midline Neck:  No JVD, no masses Lungs:   Clear bil.  No cough or SOB Heart: RRR.  No MRG.  S1, S2 present. Abdomen:  Soft, thin, NT, ND.  No mass, no HSM, no bruits, no hernia.   Rectal:  Per ED MD: soft, brown, FOBT negative.  No rectal masses   Musc/Skeltl: no joint swelling or gross deformity Extremities:  No CCE.  Thin legs and arms  Neurologic:  Alert, oriented x 3.  No tremor, no limb weakness.   Skin:  No rash or sores Nodes:  No cervical adenopathy   Psych:  Cooperative, not depressed, calm.    Intake/Output from previous day: 06/25 0701 - 06/26 0700 In: 3615 [Blood:3015; IV Piggyback:600] Out: 400 [Urine:400] Intake/Output this shift: Total I/O In: 2335 [I.V.:2000; Blood:335] Out: -   LAB RESULTS:  Recent Labs  03/02/17 1937 03/02/17 2321 03/03/17 0936 03/03/17 1011  WBC 7.5  --  6.2 6.6  HGB 2.7* 5.1* 8.2* 8.7*  HCT 10.9* 15.0* 26.2* 27.8*  PLT 167  --  110* PENDING   BMET Lab Results  Component Value Date   NA 133 (L) 03/03/2017   NA 132 (L) 03/02/2017   NA 128 (L) 03/02/2017   K 3.7 03/03/2017   K 3.8 03/02/2017   K 3.4 (L) 03/02/2017   CL 106 03/03/2017   CL 99 (L) 03/02/2017   CL 97 (L) 03/02/2017   CO2 21 (L) 03/03/2017   CO2 20 (L) 03/02/2017   CO2 25 10/06/2016   GLUCOSE 77 03/03/2017   GLUCOSE 82 03/02/2017   GLUCOSE 83 03/02/2017   BUN 5 (L) 03/03/2017   BUN 4 (L) 03/02/2017   BUN <5 (L) 03/02/2017   CREATININE 0.65 03/03/2017   CREATININE 0.40 (L) 03/02/2017  CREATININE 0.59 (L) 03/02/2017   CALCIUM 8.2 (L) 03/03/2017   CALCIUM 8.3 (L) 03/02/2017   CALCIUM 9.1 10/06/2016   LFT  Recent Labs  03/02/17 1838  PROT 7.4  ALBUMIN 2.9*  AST 41  ALT 24  ALKPHOS 169*  BILITOT 0.5   PT/INR Lab Results  Component Value Date   INR 1.06 03/02/2017   INR 1.09 08/21/2015   INR 1.02 01/01/2015   Hepatitis Panel No results for input(s): HEPBSAG, HCVAB, HEPAIGM, HEPBIGM in the last 72 hours. C-Diff No components found for: CDIFF Lipase     Component Value Date/Time    LIPASE 16 09/17/2016 1759    Drugs of Abuse     Component Value Date/Time   LABOPIA NONE DETECTED 03/02/2017 2256   COCAINSCRNUR NONE DETECTED 03/02/2017 2256   LABBENZ NONE DETECTED 03/02/2017 2256   AMPHETMU NONE DETECTED 03/02/2017 2256   THCU POSITIVE (A) 03/02/2017 2256   LABBARB NONE DETECTED 03/02/2017 2256     RADIOLOGY STUDIES: Dg Hip Unilat W Or Wo Pelvis 2-3 Views Right  Result Date: 03/03/2017 CLINICAL DATA:  Right hip pain tonight. Fell approximately 2 weeks ago. EXAM: DG HIP (WITH OR WITHOUT PELVIS) 2-3V RIGHT COMPARISON:  None. FINDINGS: There is no evidence of hip fracture or dislocation. There is no evidence of arthropathy or other focal bone abnormality. There are extensive vascular calcifications. IMPRESSION: Negative for fracture or dislocation. Extensive vascular calcifications. Electronically Signed   By: Andreas Newport M.D.   On: 03/03/2017 00:40     IMPRESSION:   *  Acute on chronic profound anemia. Hgb 2.7 >> 5.2.  Microcytic.  Non-compliant with PPI and iron.    *  Hx gastric ulcer with intestinal metaplasia on path 2016 but ulcer healed on EGD 09/2016, not taking PPI.  Completed abx for H Pylori in Jan 2018.    *  ? Cirrhosis (Hep ABC negative 12/2014).  Given portal gastropathy on 09/2016 EGD suspect fatty liver has evolved to cirrhosis.  No liver imaging since 2016.     *  Hyponatremia, non-critical.    *  Thrombocytopenia.    *  Alcoholism.  tox + for THC.     PLAN:     *  Would ask pharmacy to dose parenteral iron. Pt needs to take PPI every day   *  D/w dr Henrene Pastor, no repeat EGD or colonoscopy.  Will get Ultrasound of liver to assess for cirrhosis, masses.    *  If no endoscopy today, he can eat.     *  Given FOBT negative, did not start Rocephin.  Also does not need Protonix drip.  So started oral Protonix BID, as outpt 1 x daily will be fine.  q 8 hour CBC.     *  CIWA protocol in place, so far no signs of withdrawal.    Azucena Freed   03/03/2017, 11:28 AM Pager: [phone]  GI ATTENDING  History, laboratories, prior endoscopy reports reviewed. Patient seen and examined. Agree with comprehensive consultation note as outlined above. Patient with recurrent marked iron deficiency anemia. Has a history of adenomatous colon polyps colonoscopy 2013. Follow-up colonoscopy 2016 negative for neoplasia. Several upper endoscopies. Some of which with minimal findings. Others demonstrating gastric ulcer associated with H. pylori and changes consistent with portal gastropathy. Capsule endoscopy unrevealing. Has been transfused. No evidence for GI bleeding by history. Stool Hemoccult negative. Has not been compliant with outpatient medical therapies. Has not seen hematology, as best I can tell. I  cannot tell that he has been screened for celiac disease. I suspect that his anemia may be due to portal gastropathy, iron malabsorption, or interval upper GI mucosal disease. Could have small bowel lesions missed on recent capsule endoscopy. Question of underlying liver disease raised. Ultrasound planned to evaluate liver. RECOMMENDATIONS: 1. Iron sulfate 325 mg by mouth twice a day indefinitely for recurrent iron deficiency anemia and portal gastropathy 2. Pantoprazole 40 mg once daily indefinitely for history of H. pylori associated gastritis and ulcers 3.Tissue transglutaminase antibody IgA and serum IgA levels to screen for celiac disease 4. Transfuse to desired hemoglobin (as you have done) 5. Please arrange for this patient to have OUTPATIENT HEMATOLOGY evaluation so that they can rule out other contributors, monitor his blood counts, and provide IV iron or other therapies if necessary. This may help to avoid potential future hospitalizations. 6. Resume diet after ultrasound. Okay to follow-up with GI as outpatient  Okay to go home. Will sign off  Akshath Mccarey N. Geri Seminole., M.D. [facility] Division of Gastroenterology

## 2017-03-03 NOTE — Plan of Care (Signed)
Problem: Safety: Goal: Ability to remain free from injury will improve Outcome: Completed/Met Date Met: 03/03/17 Bed alarm applied to pt & signed fall prevention safety plan. Paper left at bedside.

## 2017-03-03 NOTE — Progress Notes (Signed)
PROGRESS NOTE    James [name]  IEP:[id number] DOB: Apr 30, 1956 DOA: 03/02/2017 PCP: Denita Lung, MD  Outpatient Specialists:     Brief Narrative:  30 male Aortic insufficiency Usual ILD Fatty/ETOH steatosis Reflux Prior heavy EtOH, TOB, Marijuana   Drinks usually 3x 12 oxz and 3-4 shots of ETOH History of 3 x adenomatous polyp >1 cm 2013  Admission 09/17/2016 for melanoma + symptomatic anemia  hemoglobin 2.7-transfused 4 units PRBC that time -underwent endoscopy by Dr.pyrtle/Hung = portal hypertension/GAVE Endo 08/2015=Large Gastric ulcer Colonoscopy 08/22/2015 = hemorrhoids    Assessment & Plan:   Principal Problem:   Symptomatic anemia Active Problems:   Tobacco abuse   Alcohol abuse   Severe protein-calorie malnutrition (HCC)   Hypokalemia   Hyponatremia   Right hip pain   Symptomatic microcytic anemia + new onset relative thrombocytopenia secondary to poor production with chronic toxin EtOH versus occult slow blood loss not visible  Hemoccult actually is negative  I asked GI to consult-? GAVE, which is unlikely given BUN/creatinine not in keeping with usual upper GI bleed  parameters of elevated bUN --patient was placed by admitting physician on Protonix gtt., defer to GI if discontinue versus not--he is not having any abdominal pain or signs of emesis  patient has a history of 3 polyp's greater than 1 cm size on last colonoscopy 2013 and  the prep performed 2016  was poor  Iron 9 however saturation ratio is also low at 3. Will need eventual repeat labs as an outpatient, currently getting 4 units  PRBC, ensure that patient discharges on oral iron twice a day   consider that he has worsening liver function and cirrhosis because of his dropping platelets--the differential for this is of course that he has lack of red cell mass causing an apparent thrombocytopenia  Alcoholism-unlikely to quit-chronic daily drinker >30 years Lactic acidosis CO2 20 on admission,  anion However 11  Patient drinks alcohol which could've caused this. He is not febrile. This is not sepsis  Patient is on CIWA score 5 at last check 345 this a.m. Alcoholic steatosis  Alkaline phosphatase 169 bilirubin 0.5-stable  potomania, EtOH  Sodium 128 on admission, given IV saline  Check magnesium, replace calcium calcium gluconate 1 on admission  Start banana bag 75 cc per hour and then normal saline once good IV access obtained  Interstitial lung disease  Outpatient follow-up pulmonary  Weight loss,  Tells me he has lost a lot of weight however her last weight in January was 47.5 and on admission 54  Check as an outpatient CEA and CA 125-has been done in the past year and a half and was negative      SCDs Confirmed full CODE STATUS Transfer to step down and then telemetry probably a.m. 6/27 Feel patient requires inpatient secondary to need for workup of acute blood loss anemia  Consultants:   Gastroenterology-unassigned  Procedures:   None as yet   Antimicrobials:   None    Subjective:  Weak since Saturday Has had intermittent bleeding on Saturday--was unformed watery on that one event He has since then not had a furrther stool No belly pain  Objective: Vitals:   03/03/17 0600 03/03/17 0615 03/03/17 0645 03/03/17 0715  BP: 110/69 108/63 114/70 127/61  Pulse: 78 79 82 75  Resp:  13 (!) 21 20  Temp: 98.5 F (36.9 C)     TempSrc: Oral     SpO2:  99% 100% 100%  Weight:  Height:        Intake/Output Summary (Last 24 hours) at 03/03/17 0801 Last data filed at 03/03/17 0557  Gross per 24 hour  Intake             2945 ml  Output              400 ml  Net             2545 ml   Filed Weights   03/02/17 1748  Weight: 54.4 kg (120 lb)    Examination:  Disheveled, sleepy but arouses fairly well By temporalis wasting S1 and S2 no murmur rub or gallop Abdomen is flat nontender no rebound no guarding Rectal deferred Lower extremities soft  without any swelling Neurologically intact Oriented    Data Reviewed: I have personally reviewed following labs and imaging studies  CBC:  Recent Labs Lab 03/02/17 0042 03/02/17 1937 03/02/17 2321  WBC 6.4 7.5  --   NEUTROABS  --  5.3  --   HGB 4.2* 2.7* 5.1*  HCT 14.4* 10.9* 15.0*  MCV 67.0* 60.2*  --   PLT 131* 167  --    Basic Metabolic Panel:  Recent Labs Lab 03/02/17 1838 03/02/17 2321  NA 128* 132*  K 3.4* 3.8  CL 97* 99*  CO2 20*  --   GLUCOSE 83 82  BUN <5* 4*  CREATININE 0.59* 0.40*  CALCIUM 8.3*  --    GFR: Estimated Creatinine Clearance: 76.5 mL/min (A) (by C-G formula based on SCr of 0.4 mg/dL (L)). Liver Function Tests:  Recent Labs Lab 03/02/17 1838  AST 41  ALT 24  ALKPHOS 169*  BILITOT 0.5  PROT 7.4  ALBUMIN 2.9*   No results for input(s): LIPASE, AMYLASE in the last 168 hours. No results for input(s): AMMONIA in the last 168 hours. Coagulation Profile:  Recent Labs Lab 03/02/17 1939  INR 1.06   Cardiac Enzymes: No results for input(s): CKTOTAL, CKMB, CKMBINDEX, TROPONINI in the last 168 hours. BNP (last 3 results) No results for input(s): PROBNP in the last 8760 hours. HbA1C: No results for input(s): HGBA1C in the last 72 hours. CBG:  Recent Labs Lab 03/02/17 1751  GLUCAP 90   Lipid Profile: No results for input(s): CHOL, HDL, LDLCALC, TRIG, CHOLHDL, LDLDIRECT in the last 72 hours. Thyroid Function Tests: No results for input(s): TSH, T4TOTAL, FREET4, T3FREE, THYROIDAB in the last 72 hours. Anemia Panel:  Recent Labs  03/02/17 2256  TIBC 335  IRON 9*  RETICCTPCT 1.0   Urine analysis:    Component Value Date/Time   COLORURINE YELLOW 03/02/2017 2256   APPEARANCEUR CLEAR 03/02/2017 2256   LABSPEC 1.009 03/02/2017 2256   PHURINE 6.0 03/02/2017 2256   GLUCOSEU NEGATIVE 03/02/2017 2256   HGBUR NEGATIVE 03/02/2017 2256   BILIRUBINUR NEGATIVE 03/02/2017 2256   KETONESUR NEGATIVE 03/02/2017 2256   PROTEINUR  NEGATIVE 03/02/2017 2256   NITRITE NEGATIVE 03/02/2017 2256   LEUKOCYTESUR NEGATIVE 03/02/2017 2256   Sepsis Labs: @LABRCNTIP (procalcitonin:4,lacticidven:4)  )No results found for this or any previous visit (from the past 240 hour(s)).       Radiology Studies: Dg Hip Unilat W Or Wo Pelvis 2-3 Views Right  Result Date: 03/03/2017 CLINICAL DATA:  Right hip pain tonight. Fell approximately 2 weeks ago. EXAM: DG HIP (WITH OR WITHOUT PELVIS) 2-3V RIGHT COMPARISON:  None. FINDINGS: There is no evidence of hip fracture or dislocation. There is no evidence of arthropathy or other focal bone abnormality. There are extensive vascular  calcifications. IMPRESSION: Negative for fracture or dislocation. Extensive vascular calcifications. Electronically Signed   By: Andreas Newport M.D.   On: 03/03/2017 00:40        Scheduled Meds: . folic acid  1 mg Oral Daily  . LORazepam  0-4 mg Intravenous Q6H   Followed by  . [START ON 03/05/2017] LORazepam  0-4 mg Intravenous Q12H  . multivitamin with minerals  1 tablet Oral Daily  . nicotine  21 mg Transdermal Daily  . sodium chloride flush  3 mL Intravenous Q12H  . thiamine  100 mg Oral Daily   Or  . thiamine  100 mg Intravenous Daily   Continuous Infusions: . pantoprozole (PROTONIX) infusion 8 mg/hr (03/02/17 2334)     LOS: 1 day    Time spent: [employer], MD [facility] ([phone]   If 7PM-7AM, please contact night-coverage [email or web address] Password [email or web address] 03/03/2017, 8:01 AM

## 2017-03-03 NOTE — ED Notes (Signed)
MD at bedside at this time.

## 2017-03-03 NOTE — Progress Notes (Signed)
Initial Nutrition Assessment  DOCUMENTATION CODES:   Severe malnutrition in context of chronic illness, Underweight  INTERVENTION:    Ensure Enlive po TID, each supplement provides 350 kcal and 20 grams of protein  NUTRITION DIAGNOSIS:   Malnutrition (severe) related to chronic illness (alcoholism, dysphagia, interstitial lung disease) as evidenced by severe depletion of body fat, severe depletion of muscle mass.  GOAL:   Patient will meet greater than or equal to 90% of their needs  MONITOR:   PO intake, Supplement acceptance, Labs, I & O's  REASON FOR ASSESSMENT:   Consult Assessment of nutrition requirement/status  ASSESSMENT:   61 yo male with hx of interstitial lung DZ, dysphagia, GI bleeding, GERD, alcoholism, anemia, tobacco abuse, smoker who was admitted on 6/25 with symptomatic anemia.  Patient reports that he is hungry. Hasn't had anything to eat today. Late lunch tray at bedside. Helped patient adjust his tray table for lunch. He loves Ensure. Recent weakness and poor appetite associated with anemia. Nutrition-Focused physical exam completed. Findings are severe fat depletion, severe muscle depletion, and no edema.  Weight is chronically below ideal weight. 104-110 lbs in January.  Labs and medications reviewed.  Diet Order:  Diet NPO time specified Diet regular Room service appropriate? Yes; Fluid consistency: Thin  Skin:  Reviewed, no issues  Last BM:  6/24  Height:   Ht Readings from Last 1 Encounters:  03/03/17 5\' 9"  (1.753 m)    Weight:   Wt Readings from Last 1 Encounters:  03/03/17 107 lb 2.3 oz (48.6 kg)    Ideal Body Weight:  72.7 kg  BMI:  Body mass index is 15.82 kg/m.  Estimated Nutritional Needs:   Kcal:  1700-1900  Protein:  75-85 gm  Fluid:  1.8 L  EDUCATION NEEDS:   No education needs identified at this time  Molli Barrows, [place], [place], [place] Pager [phone] After Hours Pager [phone]

## 2017-03-03 NOTE — ED Notes (Signed)
No reaction noted after 1st 15 min infusion. Pt verbalized understanding of call bell and s/s of transfusion reaction. Urinal at bedside.

## 2017-03-04 ENCOUNTER — Telehealth: Payer: Self-pay | Admitting: Hematology

## 2017-03-04 DIAGNOSIS — E876 Hypokalemia: Secondary | ICD-10-CM | POA: Diagnosis not present

## 2017-03-04 DIAGNOSIS — D649 Anemia, unspecified: Secondary | ICD-10-CM | POA: Diagnosis not present

## 2017-03-04 DIAGNOSIS — E871 Hypo-osmolality and hyponatremia: Secondary | ICD-10-CM | POA: Diagnosis not present

## 2017-03-04 LAB — CBC
HEMATOCRIT: 23.8 % — AB (ref 39.0–52.0)
HEMOGLOBIN: 7.6 g/dL — AB (ref 13.0–17.0)
MCH: 23.4 pg — ABNORMAL LOW (ref 26.0–34.0)
MCHC: 31.9 g/dL (ref 30.0–36.0)
MCV: 73.2 fL — ABNORMAL LOW (ref 78.0–100.0)
Platelets: 111 10*3/uL — ABNORMAL LOW (ref 150–400)
RBC: 3.25 MIL/uL — AB (ref 4.22–5.81)
RDW: 23.1 % — ABNORMAL HIGH (ref 11.5–15.5)
WBC: 7.8 10*3/uL (ref 4.0–10.5)

## 2017-03-04 LAB — COMPREHENSIVE METABOLIC PANEL
ALBUMIN: 2.3 g/dL — AB (ref 3.5–5.0)
ALK PHOS: 140 U/L — AB (ref 38–126)
ALT: 19 U/L (ref 17–63)
ANION GAP: 6 (ref 5–15)
AST: 32 U/L (ref 15–41)
BILIRUBIN TOTAL: 1.1 mg/dL (ref 0.3–1.2)
CALCIUM: 7.8 mg/dL — AB (ref 8.9–10.3)
CO2: 18 mmol/L — AB (ref 22–32)
CREATININE: 0.67 mg/dL (ref 0.61–1.24)
Chloride: 104 mmol/L (ref 101–111)
GFR calc Af Amer: >60 mL/min (ref >60)
GFR calc non Af Amer: >60 mL/min (ref >60)
GLUCOSE: 113 mg/dL — AB (ref 65–99)
Potassium: 3 mmol/L — ABNORMAL LOW (ref 3.5–5.1)
SODIUM: 128 mmol/L — AB (ref 135–145)
TOTAL PROTEIN: 5.5 g/dL — AB (ref 6.5–8.1)

## 2017-03-04 LAB — TISSUE TRANSGLUTAMINASE, IGG: TISSUE TRANSGLUT AB: 7 U/mL — AB (ref 0–5)

## 2017-03-04 LAB — BASIC METABOLIC PANEL
ANION GAP: 4 — AB (ref 5–15)
BUN: <5 mg/dL — ABNORMAL LOW (ref 6–20)
CALCIUM: 7.8 mg/dL — AB (ref 8.9–10.3)
CHLORIDE: 106 mmol/L (ref 101–111)
CO2: 20 mmol/L — ABNORMAL LOW (ref 22–32)
CREATININE: 0.69 mg/dL (ref 0.61–1.24)
GFR calc non Af Amer: >60 mL/min (ref >60)
Glucose, Bld: 99 mg/dL (ref 65–99)
Potassium: 3.4 mmol/L — ABNORMAL LOW (ref 3.5–5.1)
SODIUM: 130 mmol/L — AB (ref 135–145)

## 2017-03-04 LAB — PHOSPHORUS: Phosphorus: 1.6 mg/dL — ABNORMAL LOW (ref 2.5–4.6)

## 2017-03-04 LAB — GLUCOSE, CAPILLARY
GLUCOSE-CAPILLARY: 103 mg/dL — AB (ref 65–99)
Glucose-Capillary: 87 mg/dL (ref 65–99)

## 2017-03-04 LAB — TISSUE TRANSGLUTAMINASE, IGA: TISSUE TRANSGLUTAMINASE AB, IGA: 3 U/mL (ref 0–3)

## 2017-03-04 LAB — MAGNESIUM: Magnesium: 1.6 mg/dL — ABNORMAL LOW (ref 1.7–2.4)

## 2017-03-04 LAB — HAPTOGLOBIN: HAPTOGLOBIN: 170 mg/dL (ref 34–200)

## 2017-03-04 LAB — PATHOLOGIST SMEAR REVIEW

## 2017-03-04 LAB — IGA: IgA: 835 mg/dL — ABNORMAL HIGH (ref 90–386)

## 2017-03-04 MED ORDER — NICOTINE 21 MG/24HR TD PT24: 21.0000 mg | MEDICATED_PATCH | Freq: Every day | TRANSDERMAL | 0 refills | Status: DC

## 2017-03-04 MED ORDER — POTASSIUM CHLORIDE CRYS ER 20 MEQ PO TBCR
20.0000 meq | EXTENDED_RELEASE_TABLET | Freq: Two times a day (BID) | ORAL | Status: AC
  Administered 2017-03-04: 20 meq via ORAL
  Filled 2017-03-04: qty 1

## 2017-03-04 MED ORDER — POTASSIUM CHLORIDE CRYS ER 20 MEQ PO TBCR: 20.0000 meq | EXTENDED_RELEASE_TABLET | Freq: Two times a day (BID) | ORAL | Status: DC

## 2017-03-04 MED ORDER — MAGNESIUM SULFATE IN D5W 1-5 GM/100ML-% IV SOLN
1.0000 g | Freq: Once | INTRAVENOUS | Status: AC
  Administered 2017-03-04: 1 g via INTRAVENOUS
  Filled 2017-03-04: qty 100

## 2017-03-04 MED ORDER — POTASSIUM CHLORIDE 10 MEQ/100ML IV SOLN
10.0000 meq | INTRAVENOUS | Status: AC
  Administered 2017-03-04 (×3): 10 meq via INTRAVENOUS
  Filled 2017-03-04 (×3): qty 100

## 2017-03-04 NOTE — Care Management Note (Signed)
Case Management Note  Patient Details  Name: James [name] MRN: [id number] Date of Birth: [birth year]  Subjective/Objective: Pt presented for general weakness. Hx of GI Bleed and ETOH Abuse. GI consulted with patient. Pt has PCP: Denita Lung, MD. Pt is from home with wife and son. Pt has DME cane at home. Plan to return home once stable.               Action/Plan: No Home Needs Identified by CM at this time.   Expected Discharge Date:                  Expected Discharge Plan:  Home/Self Care  In-House Referral:  NA  Discharge planning Services  CM Consult  Post Acute Care Choice:  NA Choice offered to:  NA  DME Arranged:  N/A DME Agency:  NA  HH Arranged:  NA HH Agency:  NA  Status of Service:  Completed, signed off  If discussed at [place] of Stay Meetings, dates discussed:    Additional Comments:  Bethena Roys, RN 03/04/2017, 5:24 PM

## 2017-03-04 NOTE — Discharge Summary (Signed)
James [name], is a 61 y.o. male  DOB August 20, 1956  MRN [id number].  Admission date:  03/02/2017  Admitting Physician  Ivor Costa, MD  Discharge Date:  03/04/2017   Primary MD  Denita Lung, MD  Recommendations for primary care physician for things to follow:   Symptomatic microcytic anemia + new onset relative thrombocytopenia secondary to poor production with chronic toxin EtOH versus occult slow blood loss not visible Hemoccult actually is negative Iron saturation =3.  Pt may need IV iron transfusion  Please consult hematology as outpatient to evaluation iron deficiency per GI recommendation Continue Ferrous sulfate 325mg  po bid F/u on Celiac panel Check cbc in 1-2 weeks  Alcoholism-unlikely to quit-chronic daily drinker >30 years CO2 20 on admission, anion However 11 No clinical signs of withdrawal  Pt encourage to STOP ETOH use  [place], EtOH Sodium 128 on admission, given IV saline, sodium stable Check bmp in 1-2 week, if still low consider checking cortisol, tsh, serum osm, urine sodium, urine osm  Hypokalemia repleted Check bmp in 1-2 weeks  Interstitial lung disease             Outpatient follow-up pulmonary  Weight loss,              Tells me he has lost a lot of weight however her last weight in January was 47.5 and on admission 54             Check as an outpatient CEA and CA 125-has been done in the past year and a half and was negative  pcp to please follow up    Admission Diagnosis  Lactic acidosis [E87.2] Severe anemia [D64.9] Low mean corpuscular volume (MCV) [R71.8]   Discharge Diagnosis  Lactic acidosis [E87.2] Severe anemia [D64.9] Low mean corpuscular volume (MCV) [R71.8]     Principal Problem:   Symptomatic anemia Active Problems:   Tobacco abuse   Alcohol abuse   Severe protein-calorie malnutrition (HCC)   Hypokalemia   Hyponatremia   Right  hip pain      Past Medical History:  Diagnosis Date  . Alcohol abuse   . Anemia due to GI blood loss 09/2011   microcytic.  transfused for Hgb 3.5, MCV in 50s.   . Aortic insufficiency   . Benign neoplasm of colon   . Black stools 08/21/2015  . Cachexia ([place])   . Chest pain 08/21/2015  . Dysphagia   . ED (erectile dysfunction)   . Edema   . GERD (gastroesophageal reflux disease)   . ILD (interstitial lung disease) ([place])   . Iron deficiency anemia, unspecified   . Pulmonary nodule   . Reflux esophagitis   . Smoker   . Tobacco abuse   . Weight loss, abnormal     Past Surgical History:  Procedure Laterality Date  . COLONOSCOPY  10/03/2011   Procedure: COLONOSCOPY;  Surgeon: Scarlette Shorts, MD;  Location: [place];  Service: Endoscopy;  Laterality: N/A;  . COLONOSCOPY WITH PROPOFOL N/A 08/22/2015  Procedure: COLONOSCOPY WITH PROPOFOL;  Surgeon: Wilford Corner, MD;  Location: [facility] ENDOSCOPY;  Service: Endoscopy;  Laterality: N/A;  . ESOPHAGOGASTRODUODENOSCOPY  10/02/2011   Procedure: ESOPHAGOGASTRODUODENOSCOPY (EGD);  Surgeon: Scarlette Shorts, MD;  Location[facility] ENDOSCOPY;  Service: Endoscopy;  Laterality: N/A;  . ESOPHAGOGASTRODUODENOSCOPY N/A 01/05/2015   Procedure: ESOPHAGOGASTRODUODENOSCOPY (EGD);  Surgeon: Inda Castle, MD;  Location: [place];  Service: Endoscopy;  Laterality: N/A;  . ESOPHAGOGASTRODUODENOSCOPY (EGD) WITH PROPOFOL N/A 08/22/2015   Procedure: ESOPHAGOGASTRODUODENOSCOPY (EGD) WITH PROPOFOL;  Surgeon: Wilford Corner, MD;  Location: [facility] ENDOSCOPY;  Service: Endoscopy;  Laterality: N/A;  . ESOPHAGOGASTRODUODENOSCOPY (EGD) WITH PROPOFOL N/A 09/19/2016   Procedure: ESOPHAGOGASTRODUODENOSCOPY (EGD) WITH PROPOFOL;  Surgeon: Carol Ada, MD;  Location: [facility] ENDOSCOPY;  Service: Endoscopy;  Laterality: N/A;  . GIVENS CAPSULE STUDY N/A 09/19/2016   Procedure: GIVENS CAPSULE STUDY;  Surgeon: Carol Ada, MD;  Location: [place];  Service: Endoscopy;  Laterality: N/A;        HPI  from the history and physical done on the day of admission:     61 y.o. male with medical history significant of alcohol abuse, GI bleeding, aortic insufficiency, interstitial lung disease, iron deficiency anemia, tobacco abuse, GERD, who presents with generalized weakness.  Patient states that he has been having generalized weakness for several months, which has been progressively getting worse recently. Patient denies chest pain, shortness breath, nausea, vomiting, diarrhea, abdominal pain, symptoms of UTI. He denies hematuria, hematochezia or hematemesis. Patient states that he injured his right hand. Recently, now has some mild right hip pain.  # Capsule endoscopy by Dr. Hilarie Fredrickson on 09/19/16 was negative. # EGD on 09/19/16 by Dr Benson Norway showed portal hypertension. # Colonoscopy on 08/22/15 by Dr. Michail Sermon showed hemorrhoids.  ED Course: pt was found to have hemoglobin 2.7 which was 11.4 on 10/06/16, negative FOBT, WBC 7.5, lactic acid 4.27-->1.16, INR 1.04, potassium is 3.4, creatinine normal, sodium 128. X-ray of right hip/pelvis is negative for bony fracture. Temperature normal, tachycardia, oxygen saturation 92-100% on room air, blood pressure is soft. Patient is admitted to stepdown as inpatient. PCCM was consulted by EDP--> Dr. Halford Chessman recommended to admit to stepdown.         Hospital Course:    Pt was admitted and transfused with 2 units prbc.  Hgb improved from 5.1=> 8.2.  hgb trended down slightly and GI consulted and recommended continuation of protonix which the patient has been noncompliant with.  Since stool heme negative they did not want to proceed with endoscopy.  They recommended continued iron supplementation as well as ultrasound RUQ (6/26/)=> fatty infiltration.   At this time Celiac panel is pending.  Pt was recommended to STOP etoh use.  He has not had withdrawal while hospitalized.  Pt denies n/v, abd pain, diarrhea, brbpr, and feels better after transfusion  and will be discharged to home.      Follow UP  Follow-up Information    Irene Shipper, MD Follow up on 04/09/2017.   Specialty:  Gastroenterology Why:  10:45 AM for follow up of anemia and GI bleeding.   Contact information: [address] [place] [address] [phone]        Denita Lung, MD Follow up in 1 week(s).   Specialty:  Family Medicine Contact information: [place] [address] [phone]        Truitt Merle, MD Follow up on 03/24/2017.   Specialties:  Hematology, Oncology Why:  10:15 with blood specialist.  If you can not  make this appoointment time, call to reschedule please Contact information: [place] [address] [phone]            Consults obtained - gastroenterology  Discharge Condition: stable  Diet and Activity recommendation: See Discharge Instructions below  Discharge Instructions         Discharge Medications     Allergies as of 03/04/2017   No Known Allergies     Medication List    TAKE these medications   ENSURE Take 237 mLs by mouth daily.   ferrous sulfate 325 (65 FE) MG tablet Take 1 tablet (325 mg total) by mouth 2 (two) times daily with a meal.   folic acid 1 MG tablet Commonly known as:  FOLVITE Take 1 tablet (1 mg total) by mouth daily.   nicotine 21 mg/24hr patch Commonly known as:  NICODERM CQ - dosed in mg/24 hours Place 1 patch (21 mg total) onto the skin daily.   pantoprazole 40 MG tablet Commonly known as:  PROTONIX Take 1 tablet (40 mg total) by mouth daily at 6 (six) AM.   thiamine 100 MG tablet Take 1 tablet (100 mg total) by mouth daily.       Major procedures and Radiology Reports - PLEASE review detailed and final reports for all details, in brief -      US Abdomen Limited  Result Date: 03/03/2017 CLINICAL DATA:  Fatty liver.  Cirrhosis.  Alcoholism. EXAM: ULTRASOUND ABDOMEN LIMITED RIGHT UPPER QUADRANT COMPARISON:   08/21/2015 FINDINGS: Gallbladder: There are no gallstones. Negative Murphy sign. There is gallbladder sludge. Gallbladder wall thickening and edema are present. Wall measures up to 8 mm. There is some pericholecystic fluid. Common bile duct: Diameter: 3 mm Liver: Liver is diffusely increased in echogenicity without focal mass. IMPRESSION: There is nonspecific gallbladder wall thickening and edema. No gallstone or Percell Miller sign is appreciated. Diffuse hepatic steatosis. Electronically Signed   By: Marybelle Killings M.D.   On: 03/03/2017 12:39   Dg Hip Unilat W Or Wo Pelvis 2-3 Views Right  Result Date: 03/03/2017 CLINICAL DATA:  Right hip pain tonight. Fell approximately 2 weeks ago. EXAM: DG HIP (WITH OR WITHOUT PELVIS) 2-3V RIGHT COMPARISON:  None. FINDINGS: There is no evidence of hip fracture or dislocation. There is no evidence of arthropathy or other focal bone abnormality. There are extensive vascular calcifications. IMPRESSION: Negative for fracture or dislocation. Extensive vascular calcifications. Electronically Signed   By: Andreas Newport M.D.   On: 03/03/2017 00:40    Micro Results     Recent Results (from the past 240 hour(s))  MRSA PCR Screening     Status: None   Collection Time: 03/03/17 10:40 AM  Result Value Ref Range Status   MRSA by PCR NEGATIVE NEGATIVE Final    Comment:        The GeneXpert MRSA Assay (FDA approved for NASAL specimens only), is one component of a comprehensive MRSA colonization surveillance program. It is not intended to diagnose MRSA infection nor to guide or monitor treatment for MRSA infections.        Today   Subjective    [employer] today has no brbpr,  Denies fever, chills, dizziness, n/v, abd pain, diarrhea, constipation.  headache,no chest no new weakness tingling or numbness, feels much better wants to go home today.    Objective   Blood pressure (!) 108/54, pulse 92, temperature 98.2 F (36.8 C), temperature source Oral, resp.  rate 18, height 5\' 9"  (1.753 m),  weight 50.2 kg (110 lb 11.2 oz), SpO2 95 %.   Intake/Output Summary (Last 24 hours) at 03/04/17 1820 Last data filed at 03/04/17 1500  Gross per 24 hour  Intake          1053.67 ml  Output              450 ml  Net           603.67 ml    Exam Awake Alert, Oriented x 3, No new F.N deficits, Normal affect [place].AT,PERRAL Supple Neck,No JVD, No cervical lymphadenopathy appriciated.  Symmetrical Chest wall movement, Good air movement bilaterally, CTAB RRR,No Gallops,Rubs or new Murmurs, No Parasternal Heave +ve B.Sounds, Abd Soft, Non tender, No organomegaly appriciated, No rebound -guarding or rigidity. No Cyanosis, Clubbing or edema, No new Rash or bruise   Data Review   CBC w Diff:  Lab Results  Component Value Date   WBC 7.8 03/04/2017   HGB 7.6 (L) 03/04/2017   HCT 23.8 (L) 03/04/2017   PLT 111 (L) 03/04/2017   LYMPHOPCT 19 03/02/2017   MONOPCT 8 03/02/2017   EOSPCT 1 03/02/2017   BASOPCT 1 03/02/2017    CMP:  Lab Results  Component Value Date   NA 130 (L) 03/04/2017   K 3.4 (L) 03/04/2017   CL 106 03/04/2017   CO2 20 (L) 03/04/2017   BUN <5 (L) 03/04/2017   CREATININE 0.69 03/04/2017   CREATININE 0.56 (L) 10/06/2016   PROT 5.5 (L) 03/04/2017   ALBUMIN 2.3 (L) 03/04/2017   BILITOT 1.1 03/04/2017   ALKPHOS 140 (H) 03/04/2017   AST 32 03/04/2017   ALT 19 03/04/2017  .   Total Time in preparing paper work, data evaluation and todays exam - 9 minutes  Jani Gravel M.D on 03/04/2017 at 6:20 PM  [facility]   Office  [phone]

## 2017-03-04 NOTE — Telephone Encounter (Signed)
Received a call from Gerre Couch, RN to schedule the pt an appt. Appt has been scheduled for the pt to see Dr. Burr Medico on 7/17 at 1015am. She will notify the pt since he is in the hospital.

## 2017-03-05 ENCOUNTER — Encounter (HOSPITAL_COMMUNITY): Payer: Self-pay | Admitting: *Deleted

## 2017-03-06 LAB — TYPE AND SCREEN
ABO/RH(D): O POS
ANTIBODY SCREEN: NEGATIVE
UNIT DIVISION: 0
UNIT DIVISION: 0
Unit division: 0
Unit division: 0
Unit division: 0
Unit division: 0

## 2017-03-06 LAB — BPAM RBC
BLOOD PRODUCT EXPIRATION DATE: 201807172359
BLOOD PRODUCT EXPIRATION DATE: 201807182359
BLOOD PRODUCT EXPIRATION DATE: 201807192359
BLOOD PRODUCT EXPIRATION DATE: 201807192359
Blood Product Expiration Date: 201807192359
Blood Product Expiration Date: 201807192359
ISSUE DATE / TIME: 201806222227
ISSUE DATE / TIME: 201806222227
ISSUE DATE / TIME: 201806252057
ISSUE DATE / TIME: 201806260019
ISSUE DATE / TIME: 201806260318
ISSUE DATE / TIME: 201806260522
UNIT TYPE AND RH: 5100
UNIT TYPE AND RH: 5100
UNIT TYPE AND RH: 5100
Unit Type and Rh: 5100
Unit Type and Rh: 5100
Unit Type and Rh: 5100

## 2017-03-24 ENCOUNTER — Encounter: Payer: BC Managed Care – PPO | Admitting: Hematology

## 2017-04-09 ENCOUNTER — Ambulatory Visit: Payer: BC Managed Care – PPO | Admitting: Internal Medicine

## 2017-09-09 ENCOUNTER — Encounter (HOSPITAL_COMMUNITY): Payer: Self-pay | Admitting: Emergency Medicine

## 2017-09-09 ENCOUNTER — Emergency Department (HOSPITAL_COMMUNITY): Payer: BC Managed Care – PPO

## 2017-09-09 ENCOUNTER — Inpatient Hospital Stay (HOSPITAL_COMMUNITY)
Admission: EM | Admit: 2017-09-09 | Discharge: 2017-09-14 | DRG: 811 | Disposition: A | Discharge status: Home / Self Care | Payer: BC Managed Care – PPO | Attending: Family Medicine | Admitting: Family Medicine

## 2017-09-09 DIAGNOSIS — E872 Acidosis, unspecified: Secondary | ICD-10-CM

## 2017-09-09 DIAGNOSIS — D649 Anemia, unspecified: Secondary | ICD-10-CM

## 2017-09-09 DIAGNOSIS — D500 Iron deficiency anemia secondary to blood loss (chronic): Secondary | ICD-10-CM | POA: Diagnosis not present

## 2017-09-09 DIAGNOSIS — F17210 Nicotine dependence, cigarettes, uncomplicated: Secondary | ICD-10-CM | POA: Diagnosis present

## 2017-09-09 DIAGNOSIS — J9601 Acute respiratory failure with hypoxia: Secondary | ICD-10-CM

## 2017-09-09 DIAGNOSIS — F10239 Alcohol dependence with withdrawal, unspecified: Secondary | ICD-10-CM | POA: Diagnosis present

## 2017-09-09 DIAGNOSIS — F1210 Cannabis abuse, uncomplicated: Secondary | ICD-10-CM | POA: Diagnosis present

## 2017-09-09 DIAGNOSIS — E871 Hypo-osmolality and hyponatremia: Secondary | ICD-10-CM | POA: Diagnosis present

## 2017-09-09 DIAGNOSIS — E43 Unspecified severe protein-calorie malnutrition: Secondary | ICD-10-CM | POA: Diagnosis present

## 2017-09-09 DIAGNOSIS — R740 Nonspecific elevation of levels of transaminase and lactic acid dehydrogenase [LDH]: Secondary | ICD-10-CM | POA: Diagnosis present

## 2017-09-09 DIAGNOSIS — Z79899 Other long term (current) drug therapy: Secondary | ICD-10-CM

## 2017-09-09 DIAGNOSIS — Z716 Tobacco abuse counseling: Secondary | ICD-10-CM

## 2017-09-09 DIAGNOSIS — Z8249 Family history of ischemic heart disease and other diseases of the circulatory system: Secondary | ICD-10-CM

## 2017-09-09 DIAGNOSIS — E876 Hypokalemia: Secondary | ICD-10-CM | POA: Diagnosis present

## 2017-09-09 DIAGNOSIS — Z681 Body mass index (BMI) 19 or less, adult: Secondary | ICD-10-CM

## 2017-09-09 DIAGNOSIS — K31819 Angiodysplasia of stomach and duodenum without bleeding: Secondary | ICD-10-CM | POA: Diagnosis present

## 2017-09-09 DIAGNOSIS — I351 Nonrheumatic aortic (valve) insufficiency: Secondary | ICD-10-CM | POA: Diagnosis present

## 2017-09-09 DIAGNOSIS — R5381 Other malaise: Secondary | ICD-10-CM | POA: Diagnosis present

## 2017-09-09 DIAGNOSIS — R627 Adult failure to thrive: Secondary | ICD-10-CM | POA: Diagnosis present

## 2017-09-09 DIAGNOSIS — D509 Iron deficiency anemia, unspecified: Secondary | ICD-10-CM | POA: Diagnosis present

## 2017-09-09 DIAGNOSIS — Z8711 Personal history of peptic ulcer disease: Secondary | ICD-10-CM

## 2017-09-09 DIAGNOSIS — I864 Gastric varices: Secondary | ICD-10-CM

## 2017-09-09 DIAGNOSIS — J189 Pneumonia, unspecified organism: Secondary | ICD-10-CM | POA: Diagnosis present

## 2017-09-09 DIAGNOSIS — K219 Gastro-esophageal reflux disease without esophagitis: Secondary | ICD-10-CM | POA: Diagnosis present

## 2017-09-09 LAB — COMPREHENSIVE METABOLIC PANEL
ALBUMIN: 3 g/dL — AB (ref 3.5–5.0)
ALT: 53 U/L (ref 17–63)
AST: 68 U/L — AB (ref 15–41)
Alkaline Phosphatase: 188 U/L — ABNORMAL HIGH (ref 38–126)
Anion gap: 18 — ABNORMAL HIGH (ref 5–15)
CHLORIDE: 93 mmol/L — AB (ref 101–111)
CO2: 14 mmol/L — AB (ref 22–32)
Calcium: 8.5 mg/dL — ABNORMAL LOW (ref 8.9–10.3)
Creatinine, Ser: 0.65 mg/dL (ref 0.61–1.24)
GFR calc non Af Amer: >60 mL/min (ref >60)
Glucose, Bld: 103 mg/dL — ABNORMAL HIGH (ref 65–99)
Potassium: 3 mmol/L — ABNORMAL LOW (ref 3.5–5.1)
SODIUM: 125 mmol/L — AB (ref 135–145)
Total Bilirubin: 1.1 mg/dL (ref 0.3–1.2)
Total Protein: 7.4 g/dL (ref 6.5–8.1)

## 2017-09-09 LAB — CBC WITH DIFFERENTIAL/PLATELET
BASOS PCT: 1 %
Basophils Absolute: 0.1 10*3/uL (ref 0.0–0.1)
EOS ABS: 0 10*3/uL (ref 0.0–0.7)
EOS PCT: 0 %
HCT: 8.8 % — ABNORMAL LOW (ref 39.0–52.0)
Hemoglobin: 2.1 g/dL — CL (ref 13.0–17.0)
LYMPHS ABS: 0.9 10*3/uL (ref 0.7–4.0)
Lymphocytes Relative: 8 %
MCH: 14.1 pg — ABNORMAL LOW (ref 26.0–34.0)
MCHC: 23.9 g/dL — AB (ref 30.0–36.0)
MCV: 59.1 fL — AB (ref 78.0–100.0)
MONO ABS: 0.9 10*3/uL (ref 0.1–1.0)
Monocytes Relative: 8 %
NEUTROS ABS: 9 10*3/uL — AB (ref 1.7–7.7)
Neutrophils Relative %: 83 %
PLATELETS: 165 10*3/uL (ref 150–400)
RBC: 1.49 MIL/uL — ABNORMAL LOW (ref 4.22–5.81)
RDW: 24.9 % — AB (ref 11.5–15.5)
WBC: 10.9 10*3/uL — ABNORMAL HIGH (ref 4.0–10.5)

## 2017-09-09 LAB — I-STAT TROPONIN, ED: Troponin i, poc: 0.01 ng/mL (ref 0.00–0.08)

## 2017-09-09 LAB — I-STAT CG4 LACTIC ACID, ED
LACTIC ACID, VENOUS: 9.36 mmol/L — AB (ref 0.5–1.9)
Lactic Acid, Venous: 8.05 mmol/L (ref 0.5–1.9)

## 2017-09-09 LAB — SALICYLATE LEVEL

## 2017-09-09 LAB — RAPID HIV SCREEN (HIV 1/2 AB+AG)
HIV 1/2 ANTIBODIES: NONREACTIVE
HIV-1 P24 ANTIGEN - HIV24: NONREACTIVE

## 2017-09-09 LAB — CBG MONITORING, ED: GLUCOSE-CAPILLARY: 112 mg/dL — AB (ref 65–99)

## 2017-09-09 LAB — ACETAMINOPHEN LEVEL

## 2017-09-09 LAB — ETHANOL: Alcohol, Ethyl (B): 23 mg/dL — ABNORMAL HIGH (ref <10)

## 2017-09-09 MED ORDER — DEXTROSE 5 % IV SOLN
1.0000 g | Freq: Once | INTRAVENOUS | Status: AC
  Administered 2017-09-09: 1 g via INTRAVENOUS
  Filled 2017-09-09: qty 10

## 2017-09-09 MED ORDER — SODIUM CHLORIDE 0.9 % IV BOLUS (SEPSIS)
500.0000 mL | Freq: Once | INTRAVENOUS | Status: AC
  Administered 2017-09-09: 500 mL via INTRAVENOUS

## 2017-09-09 MED ORDER — IOPAMIDOL (ISOVUE-300) INJECTION 61%
INTRAVENOUS | Status: AC
  Administered 2017-09-09: 100 mL
  Filled 2017-09-09: qty 100

## 2017-09-09 MED ORDER — SODIUM CHLORIDE 0.9 % IV SOLN
80.0000 mg | Freq: Once | INTRAVENOUS | Status: AC
  Administered 2017-09-09: 23:00:00 80 mg via INTRAVENOUS
  Filled 2017-09-09: qty 80

## 2017-09-09 MED ORDER — OCTREOTIDE LOAD VIA INFUSION
50.0000 ug | Freq: Once | INTRAVENOUS | Status: AC
  Administered 2017-09-09: 50 ug via INTRAVENOUS
  Filled 2017-09-09: qty 25

## 2017-09-09 MED ORDER — PANTOPRAZOLE SODIUM 40 MG IV SOLR: 40.0000 mg | Freq: Two times a day (BID) | INTRAVENOUS | Status: DC

## 2017-09-09 MED ORDER — SODIUM CHLORIDE 0.9 % IV SOLN
10.0000 mL/h | Freq: Once | INTRAVENOUS | Status: AC
  Administered 2017-09-09: 10 mL/h via INTRAVENOUS

## 2017-09-09 MED ORDER — DEXTROSE 5 % IV SOLN
1.0000 g | INTRAVENOUS | Status: DC
  Administered 2017-09-10 – 2017-09-13 (×4): 1 g via INTRAVENOUS
  Filled 2017-09-09 (×6): qty 10

## 2017-09-09 MED ORDER — SODIUM CHLORIDE 0.9 % IV BOLUS (SEPSIS)
1000.0000 mL | Freq: Once | INTRAVENOUS | Status: AC
  Administered 2017-09-09: 1000 mL via INTRAVENOUS

## 2017-09-09 MED ORDER — OCTREOTIDE ACETATE 500 MCG/ML IJ SOLN
50.0000 ug/h | INTRAMUSCULAR | Status: DC
  Administered 2017-09-09 – 2017-09-12 (×6): 50 ug/h via INTRAVENOUS
  Filled 2017-09-09 (×9): qty 1

## 2017-09-09 MED ORDER — DEXTROSE 5 % IV SOLN
500.0000 mg | Freq: Once | INTRAVENOUS | Status: AC
  Administered 2017-09-09: 500 mg via INTRAVENOUS
  Filled 2017-09-09: qty 500

## 2017-09-09 MED ORDER — DEXTROSE 5 % IV SOLN
500.0000 mg | INTRAVENOUS | Status: DC
  Administered 2017-09-11 (×2): 500 mg via INTRAVENOUS
  Filled 2017-09-09 (×3): qty 500

## 2017-09-09 MED ORDER — SODIUM CHLORIDE 0.9 % IV SOLN
8.0000 mg/h | INTRAVENOUS | Status: DC
  Filled 2017-09-09 (×3): qty 80

## 2017-09-09 NOTE — ED Notes (Signed)
CODE SEPSIS ACTIVATED RN CHRIS AWARE

## 2017-09-09 NOTE — ED Triage Notes (Signed)
Pt reports gen weakness and "not feeling good" X several weeks. Pt has sickly appearance, also reports weight loss over the past several weeks.

## 2017-09-09 NOTE — Progress Notes (Signed)
Present to place second PIV.  Pt. In CT scan.  RN to notify when pt back in room

## 2017-09-09 NOTE — ED Notes (Signed)
Per Dr. Darl Householder pt needs next room

## 2017-09-09 NOTE — ED Provider Notes (Signed)
[facility] EMERGENCY DEPARTMENT Provider Note   CSN: [id number] Arrival date & time: 09/09/17  1855     History   Chief Complaint Chief Complaint  Patient presents with  . Weakness    HPI James [name] is a 62 y.o. male hx of alcohol abuse, interstitial lung disease, anemia, here presenting with weight loss, back pain, chills.  Patient states that about 6 months ago, he quit drinking alcohol and smoking cigarettes.  In fact, patient was actually admitted at that time for anemia and hyponatremia.  He states that shortly afterwards, his friend died and he relapsed into alcohol and marijuana and smoking cigarettes.  Patient states that over the last 6 months, he has been losing weight and lost about 40 pounds.  He states that he has been having poor appetite but no vomiting.  He has been having progressively worsening back pain over the last several weeks.  He has no nonproductive cough as well.  Some subjective chills at home but no actual fevers.   The history is provided by the patient.    Past Medical History:  Diagnosis Date  . Alcohol abuse   . Anemia due to GI blood loss 09/2011   microcytic.  transfused for Hgb 3.5, MCV in 50s.   . Aortic insufficiency   . Benign neoplasm of colon   . Black stools 08/21/2015  . Cachexia ([place])   . Chest pain 08/21/2015  . Dysphagia   . ED (erectile dysfunction)   . Edema   . GERD (gastroesophageal reflux disease)   . ILD (interstitial lung disease) ([place])   . Iron deficiency anemia, unspecified   . Pulmonary nodule   . Reflux esophagitis   . Smoker   . Tobacco abuse   . Weight loss, abnormal     Patient Active Problem List   Diagnosis Date Noted  . Hypokalemia 03/02/2017  . Symptomatic anemia 03/02/2017  . Hyponatremia 03/02/2017  . Right hip pain 03/02/2017  . Lactic acidosis   . History of peptic ulcer disease 10/06/2016  . Poor dentition 10/06/2016  . Iron deficiency anemia due to chronic blood loss  09/17/2016  . Alcoholic gastritis with hemorrhage   . Marijuana abuse   . Severe protein-calorie malnutrition ([place]) 01/02/2015  . ILD (interstitial lung disease) ([place]) 10/24/2011  . Pulmonary nodule 10/24/2011  . Cachexia ([place]) 10/24/2011  . Benign neoplasm of colon 10/03/2011  . Anemia due to GI blood loss 10/01/2011  . Tobacco abuse 10/01/2011  . Alcohol abuse 10/01/2011  . Dysphagia, unspecified(787.20) 10/01/2011    Past Surgical History:  Procedure Laterality Date  . COLONOSCOPY  10/03/2011   Procedure: COLONOSCOPY;  Surgeon: Scarlette Shorts, MD;  Location: [facility];  Service: Endoscopy;  Laterality: N/A;  . COLONOSCOPY WITH PROPOFOL N/A 08/22/2015   Procedure: COLONOSCOPY WITH PROPOFOL;  Surgeon: Wilford Corner, MD;  Location: [facility] ENDOSCOPY;  Service: Endoscopy;  Laterality: N/A;  . ESOPHAGOGASTRODUODENOSCOPY  10/02/2011   Procedure: ESOPHAGOGASTRODUODENOSCOPY (EGD);  Surgeon: Scarlette Shorts, MD;  Location: [facility] ENDOSCOPY;  Service: Endoscopy;  Laterality: N/A;  . ESOPHAGOGASTRODUODENOSCOPY N/A 01/05/2015   Procedure: ESOPHAGOGASTRODUODENOSCOPY (EGD);  Surgeon: Inda Castle, MD;  Location: [facility];  Service: Endoscopy;  Laterality: N/A;  . ESOPHAGOGASTRODUODENOSCOPY (EGD) WITH PROPOFOL N/A 08/22/2015   Procedure: ESOPHAGOGASTRODUODENOSCOPY (EGD) WITH PROPOFOL;  Surgeon: Wilford Corner, MD;  Location: [facility] ENDOSCOPY;  Service: Endoscopy;  Laterality: N/A;  . ESOPHAGOGASTRODUODENOSCOPY (EGD) WITH PROPOFOL N/A 09/19/2016   Procedure: ESOPHAGOGASTRODUODENOSCOPY (EGD) WITH PROPOFOL;  Surgeon: Carol Ada,  MD;  Location: [facility] ENDOSCOPY;  Service: Endoscopy;  Laterality: N/A;  . GIVENS CAPSULE STUDY N/A 09/19/2016   Procedure: GIVENS CAPSULE STUDY;  Surgeon: Carol Ada, MD;  Location: [facility];  Service: Endoscopy;  Laterality: N/A;       Home Medications    Prior to Admission medications   Medication Sig Start Date End Date Taking? Authorizing Provider  ferrous sulfate 325  (65 FE) MG tablet Take 1 tablet (325 mg total) by mouth 2 (two) times daily with a meal. Patient not taking: Reported on 09/09/2017 09/21/16   Jonetta Osgood, MD  folic acid (FOLVITE) 1 MG tablet Take 1 tablet (1 mg total) by mouth daily. Patient not taking: Reported on 09/09/2017 09/22/16   Jonetta Osgood, MD  nicotine (NICODERM CQ - DOSED IN MG/24 HOURS) 21 mg/24hr patch Place 1 patch (21 mg total) onto the skin daily. Patient not taking: Reported on 09/09/2017 03/04/17   Jani Gravel, MD  pantoprazole (PROTONIX) 40 MG tablet Take 1 tablet (40 mg total) by mouth daily at 6 (six) AM. Patient not taking: Reported on 09/09/2017 09/22/16   Jonetta Osgood, MD  thiamine 100 MG tablet Take 1 tablet (100 mg total) by mouth daily. Patient not taking: Reported on 09/09/2017 09/22/16   Jonetta Osgood, MD    Family History Family History  Problem Relation Age of Onset  . Hypertension Mother   . Diabetes Maternal Grandfather     Social History Social History   Tobacco Use  . Smoking status: Former Smoker    Packs/day: 1.00    Years: 20.00    Pack years: 20.00    Types: Cigarettes  . Smokeless tobacco: Never Used  . Tobacco comment: pt states he is down to 7 cigs per day  Substance Use Topics  . Alcohol use: Yes    Alcohol/week: 2.4 oz    Types: 4 Cans of beer per week  . Drug use: Yes    Types: Marijuana     Allergies   Patient has no known allergies.   Review of Systems Review of Systems  Constitutional: Positive for unexpected weight change.  Neurological: Positive for weakness.  All other systems reviewed and are negative.    Physical Exam Updated Vital Signs BP 116/87   Pulse 80   Temp (!) 97.5 F (36.4 C) (Oral)   Resp (!) 29   Ht 5\' 9"  (1.753 m)   Wt 47.6 kg (105 lb)   SpO2 98%   BMI 15.51 kg/m   Physical Exam  Constitutional: He is oriented to person, place, and time. He appears well-developed.  Emaciated, thin   HENT:  Head: Normocephalic.  MM dry     Eyes: Conjunctivae and EOM are normal. Pupils are equal, round, and reactive to light.  Neck: Normal range of motion. Neck supple.  Cardiovascular: Normal rate, regular rhythm and normal heart sounds.  Pulmonary/Chest: Effort normal.  Crackles R base   Abdominal: Soft. Bowel sounds are normal.  Minimal epigastric tenderness, mild R CVAT   Musculoskeletal:  No midline tenderness   Neurological: He is alert and oriented to person, place, and time. No cranial nerve deficit. Coordination normal.  Skin: Skin is warm.  Psychiatric: He has a normal mood and affect.  Nursing note and vitals reviewed.    ED Treatments / Results  Labs (all labs ordered are listed, but only abnormal results are displayed) Labs Reviewed  COMPREHENSIVE METABOLIC PANEL - Abnormal; Notable for the following components:  Result Value   Sodium 125 (*)    Potassium 3.0 (*)    Chloride 93 (*)    CO2 14 (*)    Glucose, Bld 103 (*)    BUN <5 (*)    Calcium 8.5 (*)    Albumin 3.0 (*)    AST 68 (*)    Alkaline Phosphatase 188 (*)    Anion gap 18 (*)    All other components within normal limits  ETHANOL - Abnormal; Notable for the following components:   Alcohol, Ethyl (B) 23 (*)    All other components within normal limits  ACETAMINOPHEN LEVEL - Abnormal; Notable for the following components:   Acetaminophen (Tylenol), Serum <10 (*)    All other components within normal limits  CBC WITH DIFFERENTIAL/PLATELET - Abnormal; Notable for the following components:   WBC 10.9 (*)    RBC 1.49 (*)    Hemoglobin 2.1 (*)    HCT 8.8 (*)    MCV 59.1 (*)    MCH 14.1 (*)    MCHC 23.9 (*)    RDW 24.9 (*)    Neutro Abs 9.0 (*)    All other components within normal limits  CBG MONITORING, ED - Abnormal; Notable for the following components:   Glucose-Capillary 112 (*)    All other components within normal limits  I-STAT CG4 LACTIC ACID, ED - Abnormal; Notable for the following components:   Lactic Acid, Venous  8.05 (*)    All other components within normal limits  I-STAT CG4 LACTIC ACID, ED - Abnormal; Notable for the following components:   Lactic Acid, Venous 9.36 (*)    All other components within normal limits  URINE CULTURE  CULTURE, BLOOD (ROUTINE X 2)  CULTURE, BLOOD (ROUTINE X 2)  SALICYLATE LEVEL  RAPID HIV SCREEN (HIV 1/2 AB+AG)  URINALYSIS, ROUTINE W REFLEX MICROSCOPIC  RAPID URINE DRUG SCREEN, HOSP PERFORMED  I-STAT TROPONIN, ED  TYPE AND SCREEN  PREPARE RBC (CROSSMATCH)    EKG  EKG Interpretation  Date/Time:  Wednesday September 09 2017 19:36:07 EST Ventricular Rate:  110 PR Interval:  154 QRS Duration: 84 QT Interval:  340 QTC Calculation: 460 R Axis:   85 Text Interpretation:  Sinus tachycardia Right atrial enlargement Nonspecific ST abnormality Abnormal ECG ST depression new since preiouvs  Confirmed by Wandra Arthurs [phone]) on 09/09/2017 7:52:02 PM       Radiology Ct Chest W Contrast  Result Date: 09/09/2017 CLINICAL DATA:  Nausea and vomiting. Generalized weakness. Shortness of breath. Weight loss. EXAM: CT CHEST, ABDOMEN, AND PELVIS WITH CONTRAST TECHNIQUE: Multidetector CT imaging of the chest, abdomen and pelvis was performed following the standard protocol during bolus administration of intravenous contrast. CONTRAST:  171mL ISOVUE-300 IOPAMIDOL (ISOVUE-300) INJECTION 61% COMPARISON:  Chest radiograph earlier this day. Abdominal CT 08/21/2015 FINDINGS: CT CHEST FINDINGS Cardiovascular: Heart size is normal. Coronary artery calcifications versus stents. Thoracic aorta is normal in caliber without dissection. Mild calcified and noncalcified atheromatous plaque. Left vertebral artery arises directly from the aorta, a normal variant. No pericardial fluid. Mediastinum/Nodes: No enlarged mediastinal or hilar lymph nodes. The esophagus is decompressed, small hiatal hernia. Visualized thyroid gland is normal. Lungs/Pleura: Mild apical predominant emphysema. Mild central bronchial  wall thickening. Scattered bilateral fissure oral thickening. No pulmonary mass or suspicious nodule. Minimal secretions in the trachea an bronchi. No focal consolidation. No pleural fluid or pulmonary edema. Musculoskeletal: Mild marrow heterogeneity without evident focal lytic or blastic lesion. CT ABDOMEN PELVIS FINDINGS Hepatobiliary: Diffusely decreased  hepatic density. No focal hepatic lesion. Gallbladder physiologically distended, no calcified stone. No biliary dilatation. Pancreas: No ductal dilatation or inflammation. Spleen: Normal in size without focal abnormality. Adrenals/Urinary Tract: Mild bilateral adrenal thickening without dominant nodule. No hydronephrosis or perinephric edema. Homogeneous renal enhancement with symmetric excretion on delayed phase imaging. Urinary bladder is physiologically distended without wall thickening. Stomach/Bowel: Bowel evaluation is limited in the absence of enteric contrast. Diffuse gastric wall thickening, prominent about the greater curvature, similar to progressed from prior exam. 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 Majority of small bowel is nondistended. No evidence of small bowel inflammation. Minimal wall thickening of the proximal ascending colon without pericolonic inflammation. Small to moderate colonic stool burden. Vascular/Lymphatic: Moderate calcified and noncalcified aorta bi-iliac atherosclerosis. No aneurysm. Portal vein is grossly patent. No enlarged abdominal or pelvic lymph nodes. Reproductive: Prostatic calcifications. Other: No ascites or free air.  No intra-abdominal abscess. Musculoskeletal: Probable vertebral body hemangioma within L5. 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 No acute osseous abnormality. IMPRESSION: 1. Gastric wall thickening, similar to mildly progressed from prior exam. 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 2. Mild wall thickening of the ascending colon, may be secondary to nondistention or portal  gastropathy. 3. Hepatic steatosis and hepatomegaly. 4. Emphysema without acute chest finding. Aortic atherosclerosis and coronary artery calcification. Aortic Atherosclerosis (ICD10-I70.0) and Emphysema (ICD10-J43.9). Electronically Signed   By: Jeb Levering M.D.   On: 09/09/2017 22:04   Ct Abdomen Pelvis W Contrast  Result Date: 09/09/2017 CLINICAL DATA:  Nausea and vomiting. Generalized weakness. Shortness of breath. Weight loss. EXAM: CT CHEST, ABDOMEN, AND PELVIS WITH CONTRAST TECHNIQUE: Multidetector CT imaging of the chest, abdomen and pelvis was performed following the standard protocol during bolus administration of intravenous contrast. CONTRAST:  187mL ISOVUE-300 IOPAMIDOL (ISOVUE-300) INJECTION 61% COMPARISON:  Chest radiograph earlier this day. Abdominal CT 08/21/2015 FINDINGS: CT CHEST FINDINGS Cardiovascular: Heart size is normal. Coronary artery calcifications versus stents. Thoracic aorta is normal in caliber without dissection. Mild calcified and noncalcified atheromatous plaque. Left vertebral artery arises directly from the aorta, a normal variant. No pericardial fluid. Mediastinum/Nodes: No enlarged mediastinal or hilar lymph nodes. The esophagus is decompressed, small hiatal hernia. Visualized thyroid gland is normal. Lungs/Pleura: Mild apical predominant emphysema. Mild central bronchial wall thickening. Scattered bilateral fissure oral thickening. No pulmonary mass or suspicious nodule. Minimal secretions in the trachea an bronchi. No focal consolidation. No pleural fluid or pulmonary edema. Musculoskeletal: Mild marrow heterogeneity without evident focal lytic or blastic lesion. CT ABDOMEN PELVIS FINDINGS Hepatobiliary: Diffusely decreased hepatic density. No focal hepatic lesion. Gallbladder physiologically distended, no calcified stone. No biliary dilatation. Pancreas: No ductal dilatation or inflammation. Spleen: Normal in size without focal abnormality. Adrenals/Urinary Tract: Mild  bilateral adrenal thickening without dominant nodule. No hydronephrosis or perinephric edema. Homogeneous renal enhancement with symmetric excretion on delayed phase imaging. Urinary bladder is physiologically distended without wall thickening. Stomach/Bowel: Bowel evaluation is limited in the absence of enteric contrast. Diffuse gastric wall thickening, prominent about the greater curvature, similar to progressed from prior exam. 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 Majority of small bowel is nondistended. No evidence of small bowel inflammation. Minimal wall thickening of the proximal ascending colon without pericolonic inflammation. Small to moderate colonic stool burden. Vascular/Lymphatic: Moderate calcified and noncalcified aorta bi-iliac atherosclerosis. No aneurysm. Portal vein is grossly patent. No enlarged abdominal or pelvic lymph nodes. Reproductive: Prostatic calcifications. Other: No ascites or free air.  No intra-abdominal abscess. Musculoskeletal: Probable vertebral body hemangioma within L5. Possible avascular necrosis of both femoral heads  without collapse. No acute osseous abnormality. IMPRESSION: 1. Gastric wall thickening, similar to mildly progressed from prior exam. 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 2. Mild wall thickening of the ascending colon, may be secondary to nondistention or portal gastropathy. 3. Hepatic steatosis and hepatomegaly. 4. Emphysema without acute chest finding. Aortic atherosclerosis and coronary artery calcification. Aortic Atherosclerosis (ICD10-I70.0) and Emphysema (ICD10-J43.9). Electronically Signed   By: Jeb Levering M.D.   On: 09/09/2017 22:04   Dg Chest Port 1 View  Result Date: 09/09/2017 CLINICAL DATA:  Fever, cough EXAM: PORTABLE CHEST 1 VIEW COMPARISON:  Portable exam 2021 hours compared to 08/20/2015 FINDINGS: Normal heart size, mediastinal contours, and pulmonary vascularity. Hyperinflated lungs with minimal chronic interstitial prominence. No acute  infiltrate, pleural effusion or pneumothorax. Bones demineralized. IMPRESSION: Hyperinflation and minimal chronic interstitial prominence without acute abnormalities. Electronically Signed   By: Lavonia Dana M.D.   On: 09/09/2017 20:36    Procedures Procedures (including critical care time)  CRITICAL CARE Performed by: Wandra Arthurs   Total critical care time: 30 minutes  Critical care time was exclusive of separately billable procedures and treating other patients.  Critical care was necessary to treat or prevent imminent or life-threatening deterioration.  Critical care was time spent personally by me on the following activities: development of treatment plan with patient and/or surrogate as well as nursing, discussions with consultants, evaluation of patient's response to treatment, examination of patient, obtaining history from patient or surrogate, ordering and performing treatments and interventions, ordering and review of laboratory studies, ordering and review of radiographic studies, pulse oximetry and re-evaluation of patient's condition.   Medications Ordered in ED Medications  azithromycin (ZITHROMAX) 500 mg in dextrose 5 % 250 mL IVPB (not administered)  cefTRIAXone (ROCEPHIN) 1 g in dextrose 5 % 50 mL IVPB (not administered)  pantoprazole (PROTONIX) 80 mg in sodium chloride 0.9 % 250 mL (0.32 mg/mL) infusion (not administered)  pantoprazole (PROTONIX) injection 40 mg (not administered)  octreotide (SANDOSTATIN) 2 mcg/mL load via infusion 50 mcg (50 mcg Intravenous Bolus from Bag 09/09/17 2327)    And  octreotide (SANDOSTATIN) 500 mcg in sodium chloride 0.9 % 250 mL (2 mcg/mL) infusion (50 mcg/hr Intravenous New Bag/Given 09/09/17 2327)  sodium chloride 0.9 % bolus 1,000 mL (0 mLs Intravenous Stopped 09/09/17 2226)    And  sodium chloride 0.9 % bolus 500 mL (0 mLs Intravenous Stopped 09/09/17 2225)  cefTRIAXone (ROCEPHIN) 1 g in dextrose 5 % 50 mL IVPB (0 g Intravenous Stopped 09/09/17  2151)  azithromycin (ZITHROMAX) 500 mg in dextrose 5 % 250 mL IVPB (0 mg Intravenous Stopped 09/09/17 2200)  iopamidol (ISOVUE-300) 61 % injection (100 mLs  Contrast Given 09/09/17 2132)  pantoprazole (PROTONIX) 80 mg in sodium chloride 0.9 % 100 mL IVPB (0 mg Intravenous Stopped 09/09/17 2326)  0.9 %  sodium chloride infusion (10 mL/hr Intravenous New Bag/Given 09/09/17 2245)  0.9 %  sodium chloride infusion (10 mL/hr Intravenous New Bag/Given 09/09/17 2308)     Initial Impression / Assessment and Plan / ED Course  I have reviewed the triage vital signs and the nursing notes.  Pertinent labs & imaging results that were available during my care of the patient were reviewed by me and considered in my medical decision making (see chart for details).     James [name] is a 62 y.o. male here with weight loss, chills, cough. Lactate 8 in triage. He appears dehydrated and is tachycardic and has chills. Code sepsis initiated. I considered pneumonia  vs pyelo vs intra abdominal infection vs malignancy. Will get labs, repeat lactate, cultures, UA, CT chest/ab/pel. Will give rocephin, azithromycin empirically.   11 pm Patient's lactate continues to increase to 9. Hg 2.1. CT showed possible varices and gastric ulcer. I am concerned for either variceal bleed or bleeding from ulcer from alcohol use. Ordered 1 U O neg blood and 4 U PRBC. I called Dr. Benson Norway from GI. He agrees with transfusion and protonix and octreotide drip. CT showed no pneumonia but he was given rocephin, azithromycin already. He is also hyponatremic likely from alcohol use. Given multi system failure, I called Dr. Elsworth Soho from ICU, who will see and admit patient.   Final Clinical Impressions(s) / ED Diagnoses   Final diagnoses:  Anemia, unspecified type  Gastric varices  Lactic acidosis    ED Discharge Orders    None       Drenda Freeze, MD 09/09/17 2339

## 2017-09-10 ENCOUNTER — Other Ambulatory Visit: Payer: Self-pay

## 2017-09-10 DIAGNOSIS — E871 Hypo-osmolality and hyponatremia: Secondary | ICD-10-CM | POA: Diagnosis present

## 2017-09-10 DIAGNOSIS — F10239 Alcohol dependence with withdrawal, unspecified: Secondary | ICD-10-CM | POA: Diagnosis present

## 2017-09-10 DIAGNOSIS — E43 Unspecified severe protein-calorie malnutrition: Secondary | ICD-10-CM | POA: Diagnosis present

## 2017-09-10 DIAGNOSIS — I351 Nonrheumatic aortic (valve) insufficiency: Secondary | ICD-10-CM | POA: Diagnosis present

## 2017-09-10 DIAGNOSIS — F17210 Nicotine dependence, cigarettes, uncomplicated: Secondary | ICD-10-CM | POA: Diagnosis present

## 2017-09-10 DIAGNOSIS — Z716 Tobacco abuse counseling: Secondary | ICD-10-CM | POA: Diagnosis not present

## 2017-09-10 DIAGNOSIS — D649 Anemia, unspecified: Secondary | ICD-10-CM | POA: Diagnosis not present

## 2017-09-10 DIAGNOSIS — Z681 Body mass index (BMI) 19 or less, adult: Secondary | ICD-10-CM | POA: Diagnosis not present

## 2017-09-10 DIAGNOSIS — J9601 Acute respiratory failure with hypoxia: Secondary | ICD-10-CM | POA: Diagnosis not present

## 2017-09-10 DIAGNOSIS — F1210 Cannabis abuse, uncomplicated: Secondary | ICD-10-CM | POA: Diagnosis present

## 2017-09-10 DIAGNOSIS — R5381 Other malaise: Secondary | ICD-10-CM | POA: Diagnosis present

## 2017-09-10 DIAGNOSIS — E872 Acidosis: Secondary | ICD-10-CM

## 2017-09-10 DIAGNOSIS — Z79899 Other long term (current) drug therapy: Secondary | ICD-10-CM | POA: Diagnosis not present

## 2017-09-10 DIAGNOSIS — Z8249 Family history of ischemic heart disease and other diseases of the circulatory system: Secondary | ICD-10-CM | POA: Diagnosis not present

## 2017-09-10 DIAGNOSIS — I864 Gastric varices: Secondary | ICD-10-CM | POA: Diagnosis present

## 2017-09-10 DIAGNOSIS — R740 Nonspecific elevation of levels of transaminase and lactic acid dehydrogenase [LDH]: Secondary | ICD-10-CM | POA: Diagnosis present

## 2017-09-10 DIAGNOSIS — K31819 Angiodysplasia of stomach and duodenum without bleeding: Secondary | ICD-10-CM | POA: Diagnosis present

## 2017-09-10 DIAGNOSIS — K219 Gastro-esophageal reflux disease without esophagitis: Secondary | ICD-10-CM | POA: Diagnosis present

## 2017-09-10 DIAGNOSIS — E876 Hypokalemia: Secondary | ICD-10-CM | POA: Diagnosis present

## 2017-09-10 DIAGNOSIS — D509 Iron deficiency anemia, unspecified: Secondary | ICD-10-CM | POA: Diagnosis present

## 2017-09-10 DIAGNOSIS — Z8711 Personal history of peptic ulcer disease: Secondary | ICD-10-CM | POA: Diagnosis not present

## 2017-09-10 DIAGNOSIS — J189 Pneumonia, unspecified organism: Secondary | ICD-10-CM | POA: Diagnosis present

## 2017-09-10 DIAGNOSIS — R627 Adult failure to thrive: Secondary | ICD-10-CM | POA: Diagnosis present

## 2017-09-10 DIAGNOSIS — D500 Iron deficiency anemia secondary to blood loss (chronic): Secondary | ICD-10-CM | POA: Diagnosis present

## 2017-09-10 LAB — GLUCOSE, CAPILLARY
GLUCOSE-CAPILLARY: 105 mg/dL — AB (ref 65–99)
GLUCOSE-CAPILLARY: 122 mg/dL — AB (ref 65–99)
GLUCOSE-CAPILLARY: 184 mg/dL — AB (ref 65–99)
Glucose-Capillary: 179 mg/dL — ABNORMAL HIGH (ref 65–99)
Glucose-Capillary: 134 mg/dL — ABNORMAL HIGH (ref 65–99)

## 2017-09-10 LAB — COMPREHENSIVE METABOLIC PANEL
ALBUMIN: 2.4 g/dL — AB (ref 3.5–5.0)
ALK PHOS: 149 U/L — AB (ref 38–126)
ALT: 45 U/L (ref 17–63)
AST: 56 U/L — ABNORMAL HIGH (ref 15–41)
Anion gap: 8 (ref 5–15)
BUN: <5 mg/dL — ABNORMAL LOW (ref 6–20)
CALCIUM: 7.4 mg/dL — AB (ref 8.9–10.3)
CHLORIDE: 99 mmol/L — AB (ref 101–111)
CO2: 20 mmol/L — AB (ref 22–32)
CREATININE: 0.62 mg/dL (ref 0.61–1.24)
GFR calc Af Amer: >60 mL/min (ref >60)
GFR calc non Af Amer: >60 mL/min (ref >60)
GLUCOSE: 97 mg/dL (ref 65–99)
Potassium: 3.7 mmol/L (ref 3.5–5.1)
SODIUM: 127 mmol/L — AB (ref 135–145)
Total Bilirubin: 3 mg/dL — ABNORMAL HIGH (ref 0.3–1.2)
Total Protein: 6.1 g/dL — ABNORMAL LOW (ref 6.5–8.1)

## 2017-09-10 LAB — HEMOGLOBIN AND HEMATOCRIT, BLOOD
HEMATOCRIT: 22.2 % — AB (ref 39.0–52.0)
HEMATOCRIT: 21.7 % — AB (ref 39.0–52.0)
Hemoglobin: 7.1 g/dL — ABNORMAL LOW (ref 13.0–17.0)
Hemoglobin: 6.7 g/dL — CL (ref 13.0–17.0)

## 2017-09-10 LAB — URINALYSIS, ROUTINE W REFLEX MICROSCOPIC
Bilirubin Urine: NEGATIVE
Glucose, UA: NEGATIVE mg/dL
HGB URINE DIPSTICK: NEGATIVE
Ketones, ur: 5 mg/dL — AB
LEUKOCYTES UA: NEGATIVE
Nitrite: NEGATIVE
PROTEIN: NEGATIVE mg/dL
SPECIFIC GRAVITY, URINE: 1.045 — AB (ref 1.005–1.030)
pH: 6 (ref 5.0–8.0)

## 2017-09-10 LAB — RAPID URINE DRUG SCREEN, HOSP PERFORMED
AMPHETAMINES: NOT DETECTED
BENZODIAZEPINES: NOT DETECTED
Barbiturates: NOT DETECTED
Cocaine: NOT DETECTED
OPIATES: NOT DETECTED
Tetrahydrocannabinol: POSITIVE — AB

## 2017-09-10 LAB — CBC
HCT: 22.4 % — ABNORMAL LOW (ref 39.0–52.0)
HEMOGLOBIN: 7.1 g/dL — AB (ref 13.0–17.0)
MCH: 23.7 pg — AB (ref 26.0–34.0)
MCHC: 31.7 g/dL (ref 30.0–36.0)
MCV: 74.9 fL — AB (ref 78.0–100.0)
PLATELETS: 114 10*3/uL — AB (ref 150–400)
RBC: 2.99 MIL/uL — AB (ref 4.22–5.81)
RDW: 25.7 % — ABNORMAL HIGH (ref 11.5–15.5)
WBC: 7.8 10*3/uL (ref 4.0–10.5)

## 2017-09-10 LAB — PREPARE RBC (CROSSMATCH)

## 2017-09-10 LAB — PROCALCITONIN: Procalcitonin: 0.23 ng/mL

## 2017-09-10 LAB — LACTIC ACID, PLASMA: LACTIC ACID, VENOUS: 0.9 mmol/L (ref 0.5–1.9)

## 2017-09-10 LAB — PROTIME-INR
INR: 1.26
PROTHROMBIN TIME: 15.6 s — AB (ref 11.4–15.2)

## 2017-09-10 LAB — MAGNESIUM: Magnesium: 1.8 mg/dL (ref 1.7–2.4)

## 2017-09-10 LAB — MRSA PCR SCREENING: MRSA by PCR: NEGATIVE

## 2017-09-10 MED ORDER — SODIUM CHLORIDE 0.9 % IV SOLN: INTRAVENOUS | Status: DC

## 2017-09-10 MED ORDER — LORAZEPAM 1 MG PO TABS: 1.0000 mg | ORAL_TABLET | Freq: Four times a day (QID) | ORAL | Status: AC | PRN

## 2017-09-10 MED ORDER — VITAMIN B-1 100 MG PO TABS
100.0000 mg | ORAL_TABLET | Freq: Every day | ORAL | Status: DC
  Administered 2017-09-10: 100 mg via ORAL
  Filled 2017-09-10: qty 1

## 2017-09-10 MED ORDER — LORAZEPAM 2 MG/ML IJ SOLN: 0.0000 mg | Freq: Two times a day (BID) | INTRAMUSCULAR | Status: DC

## 2017-09-10 MED ORDER — SODIUM CHLORIDE 0.9 % IV SOLN
Freq: Once | INTRAVENOUS | Status: AC
  Administered 2017-09-11: 01:00:00 via INTRAVENOUS

## 2017-09-10 MED ORDER — BOOST / RESOURCE BREEZE PO LIQD CUSTOM
1.0000 | Freq: Three times a day (TID) | ORAL | Status: DC
  Administered 2017-09-10 – 2017-09-14 (×5): 1 via ORAL

## 2017-09-10 MED ORDER — POTASSIUM CHLORIDE CRYS ER 20 MEQ PO TBCR
40.0000 meq | EXTENDED_RELEASE_TABLET | Freq: Two times a day (BID) | ORAL | Status: AC
  Administered 2017-09-10 (×2): 40 meq via ORAL
  Filled 2017-09-10 (×2): qty 2

## 2017-09-10 MED ORDER — LORAZEPAM 2 MG/ML IJ SOLN: 1.0000 mg | INTRAMUSCULAR | Status: DC | PRN

## 2017-09-10 MED ORDER — FOLIC ACID 1 MG PO TABS
1.0000 mg | ORAL_TABLET | Freq: Every day | ORAL | Status: DC
  Administered 2017-09-10: 1 mg via ORAL
  Filled 2017-09-10: qty 1

## 2017-09-10 MED ORDER — LORAZEPAM 2 MG/ML IJ SOLN: 0.0000 mg | Freq: Four times a day (QID) | INTRAMUSCULAR | Status: DC

## 2017-09-10 MED ORDER — SODIUM CHLORIDE 0.9 % IV SOLN: 250.0000 mL | INTRAVENOUS | Status: DC | PRN

## 2017-09-10 MED ORDER — FOLIC ACID 1 MG PO TABS
1.0000 mg | ORAL_TABLET | Freq: Every day | ORAL | Status: DC
  Administered 2017-09-10 – 2017-09-14 (×5): 1 mg via ORAL
  Filled 2017-09-10 (×5): qty 1

## 2017-09-10 MED ORDER — VITAMIN B-1 100 MG PO TABS
100.0000 mg | ORAL_TABLET | Freq: Every day | ORAL | Status: DC
  Administered 2017-09-10 – 2017-09-14 (×5): 100 mg via ORAL
  Filled 2017-09-10 (×5): qty 1

## 2017-09-10 MED ORDER — ADULT MULTIVITAMIN W/MINERALS CH
1.0000 | ORAL_TABLET | Freq: Every day | ORAL | Status: DC
  Administered 2017-09-10: 1 via ORAL
  Filled 2017-09-10: qty 1

## 2017-09-10 MED ORDER — IPRATROPIUM-ALBUTEROL 0.5-2.5 (3) MG/3ML IN SOLN: 3.0000 mL | RESPIRATORY_TRACT | Status: DC | PRN

## 2017-09-10 MED ORDER — NICOTINE 14 MG/24HR TD PT24
14.0000 mg | MEDICATED_PATCH | Freq: Every day | TRANSDERMAL | Status: DC
  Administered 2017-09-10 – 2017-09-14 (×5): 14 mg via TRANSDERMAL
  Filled 2017-09-10 (×5): qty 1

## 2017-09-10 MED ORDER — LORAZEPAM 2 MG/ML IJ SOLN: 1.0000 mg | Freq: Four times a day (QID) | INTRAMUSCULAR | Status: AC | PRN

## 2017-09-10 MED ORDER — THIAMINE HCL 100 MG/ML IJ SOLN: 100.0000 mg | Freq: Every day | INTRAMUSCULAR | Status: DC

## 2017-09-10 MED ORDER — ADULT MULTIVITAMIN W/MINERALS CH
1.0000 | ORAL_TABLET | Freq: Every day | ORAL | Status: DC
  Administered 2017-09-10 – 2017-09-14 (×5): 1 via ORAL
  Filled 2017-09-10 (×5): qty 1

## 2017-09-10 NOTE — Progress Notes (Signed)
eLink Physician-Brief Progress Note Patient Name: James [name] DOB: 03-Jun-1956 MRN: [id number]   Date of Service  09/10/2017  HPI/Events of Note  Hgb drop from 7.1 to 6.7  eICU Interventions  Plan: Transfuse 1 unit pRBC     Intervention Category Intermediate Interventions: Bleeding - evaluation and treatment with blood products  Cobain Morici 09/10/2017, 11:38 PM

## 2017-09-10 NOTE — Progress Notes (Signed)
Initial Nutrition Assessment  DOCUMENTATION CODES:   Severe malnutrition in context of chronic illness, Underweight  INTERVENTION:   Continue Boost Breeze po TID, each supplement provides 250 kcal and 9 grams of protein  When diet appropriate, provide Ensure Enlive po BID instead, each supplement provides 350 kcal and 20 grams of protein  NUTRITION DIAGNOSIS:   Severe Malnutrition related to chronic illness(alcoholism/interstitial lung disease) as evidenced by severe muscle depletion, severe fat depletion  GOAL:   Patient will meet greater than or equal to 90% of their needs  MONITOR:   PO intake, Supplement acceptance, Weight trends, I & O's, Diet advancement  REASON FOR ASSESSMENT:   Malnutrition Screening Tool    ASSESSMENT:   Pt with PMH of chronic alcoholism, GERD, esophogitis and interstitial lung disease presents with generalized weakness found to be anemic.    Per chart, CT concerning for variceal vs gastric bleed. Pt on CIWA protocol.  Spoke with pt at bedside. Pt endorses recent weight loss of 35 lbs in 3 months. Pt reports he was sober and eating well 6 months ago, however, lost a close friend and starting drinking and smoking again. Pt reports during this time he would drink more and eat less because it "fills him up." Per chart, pt's weight chronically low 97-110 lb for > 2 years.   Pt reports a good appetite at visit.  Pt requesting Ensure or another appropriate supplement once diet advances. Pt reports he typically gets around well on his own but yesterday his muscles gave out and he fell.    Labs reviewed; CBG 105-179, Na 127, BUN <5, Albumin 2.4, Hemoglobin 7.1 (up from 2.1 upon admission), alcohol and THC positive Medications reviewed; folic acid, multivitamin, thiamine, Protonix   NUTRITION - FOCUSED PHYSICAL EXAM:    Most Recent Value  Orbital Region  Severe depletion  Upper Arm Region  Severe depletion  Thoracic and Lumbar Region  Severe  depletion  Buccal Region  Moderate depletion  Temple Region  Severe depletion  Clavicle Bone Region  Severe depletion  Clavicle and Acromion Bone Region  Severe depletion  Scapular Bone Region  Unable to assess  Dorsal Hand  Moderate depletion  Patellar Region  Severe depletion  Anterior Thigh Region  Severe depletion  Posterior Calf Region  Severe depletion  Edema (RD Assessment)  None      Diet Order:  Diet clear liquid Room service appropriate? Yes; Fluid consistency: Thin Diet NPO time specified  EDUCATION NEEDS:   No education needs have been identified at this time  Skin:  Skin Assessment: Reviewed RN Assessment  Last BM:  09/05/17  Height:   Ht Readings from Last 1 Encounters:  09/10/17 5\' 10"  (1.778 m)    Weight:   Wt Readings from Last 1 Encounters:  09/10/17 110 lb 0.2 oz (49.9 kg)    Ideal Body Weight:  75.45 kg  BMI:  Body mass index is 15.78 kg/m.  Estimated Nutritional Needs:   Kcal:  1600-1800  Protein:  75-85 grams  Fluid:  >/= 1.6 L/d  Parks Ranger, MS, RDN, LDN 09/10/2017 4:25 PM

## 2017-09-10 NOTE — Progress Notes (Signed)
Received to 6E14 via w/c from 2 Heart. VSS. Alert and oriented x 4. Oriented to room and unit. Verbalized understanding. Call bell in reach. Bed Alarm set. Provided with clear liquid diet. No further needs expressed at this time.

## 2017-09-10 NOTE — Progress Notes (Signed)
PULMONARY / CRITICAL CARE MEDICINE   Name: James [name] MRN: [id number] DOB: 12/11/1955    ADMISSION DATE:  09/09/2017 CONSULTATION DATE:  09/10/2017  REFERRING MD:  Dr. Darl Householder    CHIEF COMPLAINT: Weakness, weight loss, back pain, chills  HISTORY OF PRESENT ILLNESS:   62 year old male with past medical history of chronic alcohol abuse, tobacco and marijuana abuse, peptic ulcer disease, esophagitis, diverticulosis, and chronic anemia who presented to the ER on 09/09/2017 with generalized weakness, weight loss, back pain, and chills.  Previously evaluated in 2013 for abnormal chest CT with fine centrilobular ground-glass attenuation micronodularity in the lungs bilaterally and right lower lobe pulmonary nodule by Dr. Chase Caller, thought to be respiratory bronchiolitis interstitial lung disease however patient did not have follow-up.  Reports that he quit smoking and drinking 6 months ago after hospital admission for anemia and hyponatremia, however after a close friend passed, he started smoking, drinking alcohol, and smoking marijuana again.  He reports progressive 40 pound weight loss over the last 6 months due to poor appetite, generalized weakness, worsening back pain over the last several weeks, nonproductive cough, and chills.  Denies vomiting, shortness of breath, chest pain, or change in stools.  In the ER, patient normotensive, afebrile, tachycardic 100-111, 100% on room air.  Labs noted for hemoglobin 2.1, white count 10.9, platelets 165, sodium 125, potassium 3.0, CO2 14, anion gap 18, EtOH 23, normal troponin, initial lactate of 8.05 increasing to 9.36, UA negative with 5 ketones and high specific gravity.  Troponin negative.  EKG with minimal diffuse ST depression.  Code sepsis initiated.  Chest x-ray without acute abnormalities. Treated empirically with Rocephin and Zithromax, 1.5 L normal saline bolus, Protonix 80 mg, briefly placed on octreotide drip, and GI consulted.  First of 2 units of  packed red blood cells infusing.  PCCM to admit to ICU.   SUBJECTIVE:  Pt reports feeling better this am.  Denies fevers, chills, significant sputum production, abd pain, n/v/d, dark tarry stool at home.    VITAL SIGNS: BP (!) 95/57   Pulse 83   Temp 99.1 F (37.3 C) (Oral)   Resp 14   Ht 5\' 10"  (1.778 m)   Wt 110 lb 0.2 oz (49.9 kg)   SpO2 96%   BMI 15.78 kg/m   HEMODYNAMICS:    VENTILATOR SETTINGS:    INTAKE / OUTPUT: I/O last 3 completed shifts: In: 1678.8 [P.O.:480; I.V.:223.8; Blood:925; IV Piggyback:50] Out: -   PHYSICAL EXAMINATION: General: thin adult male in NAD, lying in bed HEENT: MM pink/moist, no JVD PSY: Calm/appropriate Neuro:  CV: s1s2 rrr, no m/r/g PULM: even/non-labored, lungs bilaterally air GI: flat / soft, non-tender, bsx4 active  Extremities: warm/dry, no edema  Skin: no rashes or lesions   LABS:  BMET Recent Labs  Lab 09/09/17 1927  NA 125*  K 3.0*  CL 93*  CO2 14*  BUN <5*  CREATININE 0.65  GLUCOSE 103*    Electrolytes Recent Labs  Lab 09/09/17 1927  CALCIUM 8.5*    CBC Recent Labs  Lab 09/09/17 2050 09/10/17 0832  WBC 10.9*  --   HGB 2.1* 7.1*  HCT 8.8* 22.2*  PLT 165  --     Coag's Recent Labs  Lab 09/10/17 0832  INR 1.26    Sepsis Markers Recent Labs  Lab 09/09/17 1942 09/09/17 2111 09/10/17 0832  LATICACIDVEN 8.05* 9.36* 0.9    ABG No results for input(s): PHART, PCO2ART, PO2ART in the last 168 hours.  Liver Enzymes  Recent Labs  Lab 09/09/17 1927  AST 68*  ALT 53  ALKPHOS 188*  BILITOT 1.1  ALBUMIN 3.0*    Cardiac Enzymes No results for input(s): TROPONINI, PROBNP in the last 168 hours.  Glucose Recent Labs  Lab 09/09/17 1931 09/10/17 0758  GLUCAP 112* 105*    Imaging Ct Chest W Contrast  Result Date: 09/09/2017 CLINICAL DATA:  Nausea and vomiting. Generalized weakness. Shortness of breath. Weight loss. EXAM: CT CHEST, ABDOMEN, AND PELVIS WITH CONTRAST TECHNIQUE:  Multidetector CT imaging of the chest, abdomen and pelvis was performed following the standard protocol during bolus administration of intravenous contrast. CONTRAST:  188mL ISOVUE-300 IOPAMIDOL (ISOVUE-300) INJECTION 61% COMPARISON:  Chest radiograph earlier this day. Abdominal CT 08/21/2015 FINDINGS: CT CHEST FINDINGS Cardiovascular: Heart size is normal. Coronary artery calcifications versus stents. Thoracic aorta is normal in caliber without dissection. Mild calcified and noncalcified atheromatous plaque. Left vertebral artery arises directly from the aorta, a normal variant. No pericardial fluid. Mediastinum/Nodes: No enlarged mediastinal or hilar lymph nodes. The esophagus is decompressed, small hiatal hernia. Visualized thyroid gland is normal. Lungs/Pleura: Mild apical predominant emphysema. Mild central bronchial wall thickening. Scattered bilateral fissure oral thickening. No pulmonary mass or suspicious nodule. Minimal secretions in the trachea an bronchi. No focal consolidation. No pleural fluid or pulmonary edema. Musculoskeletal: Mild marrow heterogeneity without evident focal lytic or blastic lesion. CT ABDOMEN PELVIS FINDINGS Hepatobiliary: Diffusely decreased hepatic density. No focal hepatic lesion. Gallbladder physiologically distended, no calcified stone. No biliary dilatation. Pancreas: No ductal dilatation or inflammation. Spleen: Normal in size without focal abnormality. Adrenals/Urinary Tract: Mild bilateral adrenal thickening without dominant nodule. No hydronephrosis or perinephric edema. Homogeneous renal enhancement with symmetric excretion on delayed phase imaging. Urinary bladder is physiologically distended without wall thickening. Stomach/Bowel: Bowel evaluation is limited in the absence of enteric contrast. Diffuse gastric wall thickening, prominent about the greater curvature, similar to progressed from prior exam. 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 Majority of small bowel is  nondistended. No evidence of small bowel inflammation. Minimal wall thickening of the proximal ascending colon without pericolonic inflammation. Small to moderate colonic stool burden. Vascular/Lymphatic: Moderate calcified and noncalcified aorta bi-iliac atherosclerosis. No aneurysm. Portal vein is grossly patent. No enlarged abdominal or pelvic lymph nodes. Reproductive: Prostatic calcifications. Other: No ascites or free air.  No intra-abdominal abscess. Musculoskeletal: Probable vertebral body hemangioma within L5. 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 No acute osseous abnormality. IMPRESSION: 1. Gastric wall thickening, similar to mildly progressed from prior exam. 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 2. Mild wall thickening of the ascending colon, may be secondary to nondistention or portal gastropathy. 3. Hepatic steatosis and hepatomegaly. 4. Emphysema without acute chest finding. Aortic atherosclerosis and coronary artery calcification. Aortic Atherosclerosis (ICD10-I70.0) and Emphysema (ICD10-J43.9). Electronically Signed   By: Jeb Levering M.D.   On: 09/09/2017 22:04   Ct Abdomen Pelvis W Contrast  Result Date: 09/09/2017 CLINICAL DATA:  Nausea and vomiting. Generalized weakness. Shortness of breath. Weight loss. EXAM: CT CHEST, ABDOMEN, AND PELVIS WITH CONTRAST TECHNIQUE: Multidetector CT imaging of the chest, abdomen and pelvis was performed following the standard protocol during bolus administration of intravenous contrast. CONTRAST:  165mL ISOVUE-300 IOPAMIDOL (ISOVUE-300) INJECTION 61% COMPARISON:  Chest radiograph earlier this day. Abdominal CT 08/21/2015 FINDINGS: CT CHEST FINDINGS Cardiovascular: Heart size is normal. Coronary artery calcifications versus stents. Thoracic aorta is normal in caliber without dissection. Mild calcified and noncalcified atheromatous plaque. Left vertebral artery arises directly from the aorta, a normal variant. No pericardial fluid.  Mediastinum/Nodes: No enlarged mediastinal or hilar lymph nodes. The esophagus is decompressed, small hiatal hernia. Visualized thyroid gland is normal. Lungs/Pleura: Mild apical predominant emphysema. Mild central bronchial wall thickening. Scattered bilateral fissure oral thickening. No pulmonary mass or suspicious nodule. Minimal secretions in the trachea an bronchi. No focal consolidation. No pleural fluid or pulmonary edema. Musculoskeletal: Mild marrow heterogeneity without evident focal lytic or blastic lesion. CT ABDOMEN PELVIS FINDINGS Hepatobiliary: Diffusely decreased hepatic density. No focal hepatic lesion. Gallbladder physiologically distended, no calcified stone. No biliary dilatation. Pancreas: No ductal dilatation or inflammation. Spleen: Normal in size without focal abnormality. Adrenals/Urinary Tract: Mild bilateral adrenal thickening without dominant nodule. No hydronephrosis or perinephric edema. Homogeneous renal enhancement with symmetric excretion on delayed phase imaging. Urinary bladder is physiologically distended without wall thickening. Stomach/Bowel: Bowel evaluation is limited in the absence of enteric contrast. Diffuse gastric wall thickening, prominent about the greater curvature, similar to progressed from prior exam. 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 Majority of small bowel is nondistended. No evidence of small bowel inflammation. Minimal wall thickening of the proximal ascending colon without pericolonic inflammation. Small to moderate colonic stool burden. Vascular/Lymphatic: Moderate calcified and noncalcified aorta bi-iliac atherosclerosis. No aneurysm. Portal vein is grossly patent. No enlarged abdominal or pelvic lymph nodes. Reproductive: Prostatic calcifications. Other: No ascites or free air.  No intra-abdominal abscess. Musculoskeletal: Probable vertebral body hemangioma within L5. 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 No acute osseous  abnormality. IMPRESSION: 1. Gastric wall thickening, similar to mildly progressed from prior exam. 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 2. Mild wall thickening of the ascending colon, may be secondary to nondistention or portal gastropathy. 3. Hepatic steatosis and hepatomegaly. 4. Emphysema without acute chest finding. Aortic atherosclerosis and coronary artery calcification. Aortic Atherosclerosis (ICD10-I70.0) and Emphysema (ICD10-J43.9). Electronically Signed   By: Jeb Levering M.D.   On: 09/09/2017 22:04   Dg Chest Port 1 View  Result Date: 09/09/2017 CLINICAL DATA:  Fever, cough EXAM: PORTABLE CHEST 1 VIEW COMPARISON:  Portable exam 2021 hours compared to 08/20/2015 FINDINGS: Normal heart size, mediastinal contours, and pulmonary vascularity. Hyperinflated lungs with minimal chronic interstitial prominence. No acute infiltrate, pleural effusion or pneumothorax. Bones demineralized. IMPRESSION: Hyperinflation and minimal chronic interstitial prominence without acute abnormalities. Electronically Signed   By: Lavonia Dana M.D.   On: 09/09/2017 20:36   STUDIES:  CT abd/ pelvis 09/09/2017 >>  1. Gastric wall thickening, similar to mildly progressed from prior exam. 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 2. Mild wall thickening of the ascending colon, may be secondary to nondistention or portal gastropathy. 3. Hepatic steatosis and hepatomegaly. 4. Emphysema without acute chest finding. Aortic atherosclerosis and coronary artery calcification. Aortic Atherosclerosis   CULTURES: MRSA PCR 09/10/2017 >> UC 09/10/2017 >> BC x2 09/10/2017 >>  ANTIBIOTICS: 1/2 azithro >> 1/2 ceftriaxone >>  SIGNIFICANT EVENTS: 1/2  Admit with cough, chills, weakness, weight loss > hgb 2.1  LINES/TUBES: PIV x 2  DISCUSSION: 24 yoM with chronic ETOH abuse, smoker, THC abuse presenting with cough, chills, back pain and weight loss found to have Hgb 2.1, hemodynamically stable, GI consulted  ASSESSMENT /  PLAN:  PULMONARY A: Tobacco Abuse Hx R lung nodule and ? RBILD P:   O2 as needed to support sats 88-95% Duoneb PRN  Nicoderm patch  Tobacco cessation counseling  Follow up as outpatient for suspected ILD   CARDIOVASCULAR A:  SIRS - last TTE 12/2014 w/ EF 55-65%, G1DD, mod AR, mild TR P:  Tele monitoring  Trend lactate   RENAL  A:   Hypokalemia  AGMA/ lactic acidosis  Hyponatremia- likely from chronic ETOH use - S/p 1.5 L  P:   Trend BMP / urinary output Replace electrolytes as indicated Avoid nephrotoxic agents, ensure adequate renal perfusion  GASTROINTESTINAL A:   ETOH abuse Suspected UGIB - CT concerning for variceal vs gastric bleed - has been hemodynamically stable despite Hgb of 2- suspect slow bleed given his compensation thus far Weight loss - subjective 40 lbs over 6 months H/O EGD in 2016 and 2018 In Jan 2018 EGD showed Gastroppathy and minor pinpoint heme seen in proximal duodenum.  Hx of esophagitis, PUD Mild Transaminitis  P:   GI consulted, appreciate input  May need EGD NPO  PPI BID Octreotide gtt for now, defer to GI  Trend LFT Continue folate, MVI, thiamine   HEMATOLOGIC A:   ABLA - suspect slow bleed, otherwise pt would be symptomatic with Hgb 2.1. S/p 2 units PRBC's with hgb increase to 7.1.  Coags wnl.  Mild leukocytosis Hx anemia, IDA? - HIV negative P:  Trend CBC Monitor for evidence of bleeding  SCD's for DVT prophylaxis   INFECTIOUS A:   Leukocytosis - afebrile, cxr neg, ua neg, low suspicion for SBP - no ascites.  Suspect elevated lactate related to profound anemia.   P:   Empiric abx as above  Trend PCT  Follow cultures   ENDOCRINE A:   At risk for hypoglycemia  P:   CBG Q4   NEUROLOGIC A:   ETOH/ illicit drug abuse At Risk ETOH withdrawal - ETOH on admit 23  ? Depression w/ weight loss and substance abuse P:   ETOH / CIWA protocol  Seizure precautions > pt report he does not shake if he doesn't drink  Folate,  thiamine, MVI  Neuro exams   FAMILY  - Updates: Patient updated on plan of care.  Pending GI evaluation, patient may be able to transfer out of ICU.   - Inter-disciplinary family meet or Palliative Care meeting due by:  09/17/2016   Noe Gens, NP-C [facility] Pgr: [phone] or if no answer [phone] 09/10/2017, 9:57 AM

## 2017-09-10 NOTE — Consult Note (Signed)
Reason for Consult: Severe anemia Referring Physician: [facility]  James [name] HPI: This is a 62 year old male with a PMH of severe and recurrent anemia, ETOH abuse, colonic polyps, and portal HTN gastropathy admitted with severe anemia.  He was feeling symptomatic with his anemia and his HGB dropped down to 2.1 g/dL.  During his prior hospital adimissions he presents with similar anemia.  He has undergone EGDs and colonoscopies in 09/2016, 08/2015, 12/2011, and 09/2011.  The only overt source of bleeding noted was a large lesser curvature gastric ulcer, which was identified by Dr. Michail Sermon on 08/2015.  The repeat EGD in 09/2016 was negative for any abnormalities as well as the follow up VCE.  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  Despite his low HGB is denies any issues with chest pain.  He feels better after being transfused 4 units of PRBC.  He also report a 40 lbs weight loss.  Past Medical History:  Diagnosis Date  . Alcohol abuse   . Anemia due to GI blood loss 09/2011   microcytic.  transfused for Hgb 3.5, MCV in 50s.   . Aortic insufficiency   . Benign neoplasm of colon   . Black stools 08/21/2015  . Cachexia ([place])   . Chest pain 08/21/2015  . Dysphagia   . ED (erectile dysfunction)   . Edema   . GERD (gastroesophageal reflux disease)   . ILD (interstitial lung disease) ([place])   . Iron deficiency anemia, unspecified   . Pulmonary nodule   . Reflux esophagitis   . Smoker   . Tobacco abuse   . Weight loss, abnormal     Past Surgical History:  Procedure Laterality Date  . COLONOSCOPY  10/03/2011   Procedure: COLONOSCOPY;  Surgeon: Scarlette Shorts, MD;  Location: [place];  Service: Endoscopy;  Laterality: N/A;  . COLONOSCOPY WITH PROPOFOL N/A 08/22/2015   Procedure: COLONOSCOPY WITH PROPOFOL;  Surgeon: Wilford Corner, MD;  Location: [facility] ENDOSCOPY;  Service: Endoscopy;  Laterality: N/A;  . ESOPHAGOGASTRODUODENOSCOPY   10/02/2011   Procedure: ESOPHAGOGASTRODUODENOSCOPY (EGD);  Surgeon: Scarlette Shorts, MD;  Location: [facility] ENDOSCOPY;  Service: Endoscopy;  Laterality: N/A;  . ESOPHAGOGASTRODUODENOSCOPY N/A 01/05/2015   Procedure: ESOPHAGOGASTRODUODENOSCOPY (EGD);  Surgeon: Inda Castle, MD;  Location: [place];  Service: Endoscopy;  Laterality: N/A;  . ESOPHAGOGASTRODUODENOSCOPY (EGD) WITH PROPOFOL N/A 08/22/2015   Procedure: ESOPHAGOGASTRODUODENOSCOPY (EGD) WITH PROPOFOL;  Surgeon: Wilford Corner, MD;  Location: [facility] ENDOSCOPY;  Service: Endoscopy;  Laterality: N/A;  . ESOPHAGOGASTRODUODENOSCOPY (EGD) WITH PROPOFOL N/A 09/19/2016   Procedure: ESOPHAGOGASTRODUODENOSCOPY (EGD) WITH PROPOFOL;  Surgeon: Carol Ada, MD;  Location: [facility] ENDOSCOPY;  Service: Endoscopy;  Laterality: N/A;  . GIVENS CAPSULE STUDY N/A 09/19/2016   Procedure: GIVENS CAPSULE STUDY;  Surgeon: Carol Ada, MD;  Location: [place];  Service: Endoscopy;  Laterality: N/A;    Family History  Problem Relation Age of Onset  . Hypertension Mother   . Diabetes Maternal Grandfather     Social History:  reports that he has quit smoking. His smoking use included cigarettes. He has a 20.00 pack-year smoking history. he has never used smokeless tobacco. He reports that he drinks about 2.4 oz of alcohol per week. He reports that he uses drugs. Drug: Marijuana.  Allergies: No Known Allergies  Medications:  Scheduled: . feeding supplement  1 Container Oral TID BM  . folic acid  1 mg Oral Daily  . multivitamin with minerals  1 tablet  Oral Daily  . nicotine  14 mg Transdermal Daily  . [START ON 09/13/2017] pantoprazole  40 mg Intravenous Q12H  . thiamine  100 mg Oral Daily   Continuous: . sodium chloride    . azithromycin    . cefTRIAXone (ROCEPHIN)  IV    . octreotide  (SANDOSTATIN)    IV infusion 50 mcg/hr (09/10/17 0850)    Results for orders placed or performed during the hospital encounter of 09/09/17 (from the past 24 hour(s))   Comprehensive metabolic panel     Status: Abnormal   Collection Time: 09/09/17  7:27 PM  Result Value Ref Range   Sodium 125 (L) 135 - 145 mmol/L   Potassium 3.0 (L) 3.5 - 5.1 mmol/L   Chloride 93 (L) 101 - 111 mmol/L   CO2 14 (L) 22 - 32 mmol/L   Glucose, Bld 103 (H) 65 - 99 mg/dL   BUN <5 (L) 6 - 20 mg/dL   Creatinine, Ser 0.65 0.61 - 1.24 mg/dL   Calcium 8.5 (L) 8.9 - 10.3 mg/dL   Total Protein 7.4 6.5 - 8.1 g/dL   Albumin 3.0 (L) 3.5 - 5.0 g/dL   AST 68 (H) 15 - 41 U/L   ALT 53 17 - 63 U/L   Alkaline Phosphatase 188 (H) 38 - 126 U/L   Total Bilirubin 1.1 0.3 - 1.2 mg/dL   GFR calc non Af Amer >60 >60 mL/min   GFR calc Af Amer >60 >60 mL/min   Anion gap 18 (H) 5 - 15  CBG monitoring, ED     Status: Abnormal   Collection Time: 09/09/17  7:31 PM  Result Value Ref Range   Glucose-Capillary 112 (H) 65 - 99 mg/dL  Ethanol     Status: Abnormal   Collection Time: 09/09/17  7:35 PM  Result Value Ref Range   Alcohol, Ethyl (B) 23 (H) <79 mg/dL  Salicylate level     Status: None   Collection Time: 09/09/17  7:35 PM  Result Value Ref Range   Salicylate Lvl <8.9 2.8 - 30.0 mg/dL  Acetaminophen level     Status: Abnormal   Collection Time: 09/09/17  7:35 PM  Result Value Ref Range   Acetaminophen (Tylenol), Serum <10 (L) 10 - 30 ug/mL  I-Stat CG4 Lactic Acid, ED     Status: Abnormal   Collection Time: 09/09/17  7:42 PM  Result Value Ref Range   Lactic Acid, Venous 8.05 (HH) 0.5 - 1.9 mmol/L   Comment NOTIFIED PHYSICIAN   I-Stat Troponin, ED (not at [facility])     Status: None   Collection Time: 09/09/17  7:49 PM  Result Value Ref Range   Troponin i, poc 0.01 0.00 - 0.08 ng/mL   Comment 3          Rapid HIV screen (HIV 1/2 Ab+Ag)     Status: None   Collection Time: 09/09/17  8:23 PM  Result Value Ref Range   HIV-1 P24 Antigen - HIV24 NON REACTIVE NON REACTIVE   HIV 1/2 Antibodies NON REACTIVE NON REACTIVE   Interpretation (HIV Ag Ab)      A non reactive test result means that HIV  1 or HIV 2 antibodies and HIV 1 p24 antigen were not detected in the specimen.  CBC with Differential     Status: Abnormal   Collection Time: 09/09/17  8:50 PM  Result Value Ref Range   WBC 10.9 (H) 4.0 - 10.5 K/uL   RBC 1.49 (L) 4.22 -  5.81 MIL/uL   Hemoglobin 2.1 (LL) 13.0 - 17.0 g/dL   HCT 8.8 (L) 39.0 - 52.0 %   MCV 59.1 (L) 78.0 - 100.0 fL   MCH 14.1 (L) 26.0 - 34.0 pg   MCHC 23.9 (L) 30.0 - 36.0 g/dL   RDW 24.9 (H) 11.5 - 15.5 %   Platelets 165 150 - 400 K/uL   Neutrophils Relative % 83 %   Lymphocytes Relative 8 %   Monocytes Relative 8 %   Eosinophils Relative 0 %   Basophils Relative 1 %   Neutro Abs 9.0 (H) 1.7 - 7.7 K/uL   Lymphs Abs 0.9 0.7 - 4.0 K/uL   Monocytes Absolute 0.9 0.1 - 1.0 K/uL   Eosinophils Absolute 0.0 0.0 - 0.7 K/uL   Basophils Absolute 0.1 0.0 - 0.1 K/uL   RBC Morphology POLYCHROMASIA PRESENT   I-Stat CG4 Lactic Acid, ED     Status: Abnormal   Collection Time: 09/09/17  9:11 PM  Result Value Ref Range   Lactic Acid, Venous 9.36 (HH) 0.5 - 1.9 mmol/L   Comment NOTIFIED PHYSICIAN   Type and screen     Status: None (Preliminary result)   Collection Time: 09/09/17 10:15 PM  Result Value Ref Range   ABO/RH(D) O POS    Antibody Screen NEG    Sample Expiration 09/12/2017    Unit Number [id number]    Blood Component Type RED CELLS,LR    Unit division 00    Status of Unit ISSUED    Transfusion Status OK TO TRANSFUSE    Crossmatch Result Compatible    Unit Number [id number]    Blood Component Type RED CELLS,LR    Unit division 00    Status of Unit ISSUED    Transfusion Status OK TO TRANSFUSE    Crossmatch Result Compatible    Unit Number [id number]    Blood Component Type RED CELLS,LR    Unit division 00    Status of Unit ISSUED    Transfusion Status OK TO TRANSFUSE    Crossmatch Result Compatible    Unit Number [id number]    Blood Component Type RED CELLS,LR    Unit division 00    Status of Unit ISSUED    Transfusion Status OK  TO TRANSFUSE    Crossmatch Result Compatible   Prepare RBC     Status: None   Collection Time: 09/09/17 10:15 PM  Result Value Ref Range   Order Confirmation ORDER PROCESSED BY BLOOD BANK   Urinalysis, Routine w reflex microscopic     Status: Abnormal   Collection Time: 09/10/17  2:01 AM  Result Value Ref Range   Color, Urine YELLOW YELLOW   APPearance CLEAR CLEAR   Specific Gravity, Urine 1.045 (H) 1.005 - 1.030   pH 6.0 5.0 - 8.0   Glucose, UA NEGATIVE NEGATIVE mg/dL   Hgb urine dipstick NEGATIVE NEGATIVE   Bilirubin Urine NEGATIVE NEGATIVE   Ketones, ur 5 (A) NEGATIVE mg/dL   Protein, ur NEGATIVE NEGATIVE mg/dL   Nitrite NEGATIVE NEGATIVE   Leukocytes, UA NEGATIVE NEGATIVE  Rapid urine drug screen (hospital performed)     Status: Abnormal   Collection Time: 09/10/17  2:01 AM  Result Value Ref Range   Opiates NONE DETECTED NONE DETECTED   Cocaine NONE DETECTED NONE DETECTED   Benzodiazepines NONE DETECTED NONE DETECTED   Amphetamines NONE DETECTED NONE DETECTED   Tetrahydrocannabinol POSITIVE (A) NONE DETECTED   Barbiturates NONE DETECTED NONE DETECTED     Ct  Chest W Contrast  Result Date: 09/09/2017 CLINICAL DATA:  Nausea and vomiting. Generalized weakness. Shortness of breath. Weight loss. EXAM: CT CHEST, ABDOMEN, AND PELVIS WITH CONTRAST TECHNIQUE: Multidetector CT imaging of the chest, abdomen and pelvis was performed following the standard protocol during bolus administration of intravenous contrast. CONTRAST:  175mL ISOVUE-300 IOPAMIDOL (ISOVUE-300) INJECTION 61% COMPARISON:  Chest radiograph earlier this day. Abdominal CT 08/21/2015 FINDINGS: CT CHEST FINDINGS Cardiovascular: Heart size is normal. Coronary artery calcifications versus stents. Thoracic aorta is normal in caliber without dissection. Mild calcified and noncalcified atheromatous plaque. Left vertebral artery arises directly from the aorta, a normal variant. No pericardial fluid. Mediastinum/Nodes: No enlarged  mediastinal or hilar lymph nodes. The esophagus is decompressed, small hiatal hernia. Visualized thyroid gland is normal. Lungs/Pleura: Mild apical predominant emphysema. Mild central bronchial wall thickening. Scattered bilateral fissure oral thickening. No pulmonary mass or suspicious nodule. Minimal secretions in the trachea an bronchi. No focal consolidation. No pleural fluid or pulmonary edema. Musculoskeletal: Mild marrow heterogeneity without evident focal lytic or blastic lesion. CT ABDOMEN PELVIS FINDINGS Hepatobiliary: Diffusely decreased hepatic density. No focal hepatic lesion. Gallbladder physiologically distended, no calcified stone. No biliary dilatation. Pancreas: No ductal dilatation or inflammation. Spleen: Normal in size without focal abnormality. Adrenals/Urinary Tract: Mild bilateral adrenal thickening without dominant nodule. No hydronephrosis or perinephric edema. Homogeneous renal enhancement with symmetric excretion on delayed phase imaging. Urinary bladder is physiologically distended without wall thickening. Stomach/Bowel: Bowel evaluation is limited in the absence of enteric contrast. Diffuse gastric wall thickening, prominent about the greater curvature, similar to progressed from prior exam. 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 Majority of small bowel is nondistended. No evidence of small bowel inflammation. Minimal wall thickening of the proximal ascending colon without pericolonic inflammation. Small to moderate colonic stool burden. Vascular/Lymphatic: Moderate calcified and noncalcified aorta bi-iliac atherosclerosis. No aneurysm. Portal vein is grossly patent. No enlarged abdominal or pelvic lymph nodes. Reproductive: Prostatic calcifications. Other: No ascites or free air.  No intra-abdominal abscess. Musculoskeletal: Probable vertebral body hemangioma within L5. 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 No acute osseous abnormality. IMPRESSION: 1. Gastric wall  thickening, similar to mildly progressed from prior exam. 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 2. Mild wall thickening of the ascending colon, may be secondary to nondistention or portal gastropathy. 3. Hepatic steatosis and hepatomegaly. 4. Emphysema without acute chest finding. Aortic atherosclerosis and coronary artery calcification. Aortic Atherosclerosis (ICD10-I70.0) and Emphysema (ICD10-J43.9). Electronically Signed   By: Jeb Levering M.D.   On: 09/09/2017 22:04   Ct Abdomen Pelvis W Contrast  Result Date: 09/09/2017 CLINICAL DATA:  Nausea and vomiting. Generalized weakness. Shortness of breath. Weight loss. EXAM: CT CHEST, ABDOMEN, AND PELVIS WITH CONTRAST TECHNIQUE: Multidetector CT imaging of the chest, abdomen and pelvis was performed following the standard protocol during bolus administration of intravenous contrast. CONTRAST:  188mL ISOVUE-300 IOPAMIDOL (ISOVUE-300) INJECTION 61% COMPARISON:  Chest radiograph earlier this day. Abdominal CT 08/21/2015 FINDINGS: CT CHEST FINDINGS Cardiovascular: Heart size is normal. Coronary artery calcifications versus stents. Thoracic aorta is normal in caliber without dissection. Mild calcified and noncalcified atheromatous plaque. Left vertebral artery arises directly from the aorta, a normal variant. No pericardial fluid. Mediastinum/Nodes: No enlarged mediastinal or hilar lymph nodes. The esophagus is decompressed, small hiatal hernia. Visualized thyroid gland is normal. Lungs/Pleura: Mild apical predominant emphysema. Mild central bronchial wall thickening. Scattered bilateral fissure oral thickening. No pulmonary mass or suspicious nodule. Minimal secretions in the trachea an bronchi. No focal consolidation. No pleural fluid or pulmonary edema.  Musculoskeletal: Mild marrow heterogeneity without evident focal lytic or blastic lesion. CT ABDOMEN PELVIS FINDINGS Hepatobiliary: Diffusely decreased hepatic density. No focal hepatic lesion. Gallbladder  physiologically distended, no calcified stone. No biliary dilatation. Pancreas: No ductal dilatation or inflammation. Spleen: Normal in size without focal abnormality. Adrenals/Urinary Tract: Mild bilateral adrenal thickening without dominant nodule. No hydronephrosis or perinephric edema. Homogeneous renal enhancement with symmetric excretion on delayed phase imaging. Urinary bladder is physiologically distended without wall thickening. Stomach/Bowel: Bowel evaluation is limited in the absence of enteric contrast. Diffuse gastric wall thickening, prominent about the greater curvature, similar to progressed from prior exam. 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 Majority of small bowel is nondistended. No evidence of small bowel inflammation. Minimal wall thickening of the proximal ascending colon without pericolonic inflammation. Small to moderate colonic stool burden. Vascular/Lymphatic: Moderate calcified and noncalcified aorta bi-iliac atherosclerosis. No aneurysm. Portal vein is grossly patent. No enlarged abdominal or pelvic lymph nodes. Reproductive: Prostatic calcifications. Other: No ascites or free air.  No intra-abdominal abscess. Musculoskeletal: Probable vertebral body hemangioma within L5. 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 No acute osseous abnormality. IMPRESSION: 1. Gastric wall thickening, similar to mildly progressed from prior exam. 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 2. Mild wall thickening of the ascending colon, may be secondary to nondistention or portal gastropathy. 3. Hepatic steatosis and hepatomegaly. 4. Emphysema without acute chest finding. Aortic atherosclerosis and coronary artery calcification. Aortic Atherosclerosis (ICD10-I70.0) and Emphysema (ICD10-J43.9). Electronically Signed   By: Jeb Levering M.D.   On: 09/09/2017 22:04   Dg Chest Port 1 View  Result Date: 09/09/2017 CLINICAL DATA:  Fever, cough EXAM: PORTABLE CHEST 1 VIEW COMPARISON:  Portable  exam 2021 hours compared to 08/20/2015 FINDINGS: Normal heart size, mediastinal contours, and pulmonary vascularity. Hyperinflated lungs with minimal chronic interstitial prominence. No acute infiltrate, pleural effusion or pneumothorax. Bones demineralized. IMPRESSION: Hyperinflation and minimal chronic interstitial prominence without acute abnormalities. Electronically Signed   By: Lavonia Dana M.D.   On: 09/09/2017 20:36    ROS:  As stated above in the HPI otherwise negative.  Blood pressure (!) 104/56, pulse 88, temperature 99.5 F (37.5 C), temperature source Oral, resp. rate 18, height 5\' 10"  (1.778 m), weight 49.9 kg (110 lb 0.2 oz), SpO2 94 %.    PE: Gen: NAD, Alert and Oriented, thin HEENT:  [place]/AT, EOMI Neck: Supple, no LAD Lungs: CTA Bilaterally CV: RRR without M/G/R ABM: Soft, NTND, +BS Ext: No C/C/E  Assessment/Plan: 1) Severe and recurrent IDA. 2) Evidence of perigastric varices. 3) History of mild portal HTN gastropathy.   Over the years the patient develops severe anemia.  Only one EGD demonstrated a very large ulcer that was secondary to H. Pylori and he was treated.  With the recurrent anemia, I think the highest yield will be to repeat the capsule endoscopy.  He may also undergo a repeat colonoscopy during this admission as he also has a history of multiple tubular adenomas.  1) Capsule endoscopy tomorrow.  He drank red liquid 30 minutes prior.    Wakisha Alberts D 09/10/2017, 6:47 AM

## 2017-09-10 NOTE — H&P (Signed)
PULMONARY / CRITICAL CARE MEDICINE   Name: James [name] MRN: [id number] DOB: Oct 19, 1955    ADMISSION DATE:  09/09/2017 CONSULTATION DATE:  09/10/2017  REFERRING MD:  Dr. Darl Householder    CHIEF COMPLAINT: Weakness, weight loss, back pain, chills  HISTORY OF PRESENT ILLNESS:   62 year old male with past medical history of chronic alcohol abuse, tobacco and marijuana abuse, peptic ulcer disease, esophagitis, diverticulosis, and chronic anemia who presented to the ER on 09/09/2017 with generalized weakness, weight loss, back pain, and chills.  Previously evaluated in 2013 for abnormal chest CT with fine centrilobular ground-glass attenuation micronodularity in the lungs bilaterally and right lower lobe pulmonary nodule by Dr. Chase Caller, thought to be respiratory bronchiolitis interstitial lung disease however patient did not have follow-up.  Reports that he quit smoking and drinking 6 months ago after hospital admission for anemia and hyponatremia, however after a close friend passed, he started smoking, drinking alcohol, and smoking marijuana again.  He reports progressive 40 pound weight loss over the last 6 months due to poor appetite, generalized weakness, worsening back pain over the last several weeks, nonproductive cough, and chills.  Denies vomiting, shortness of breath, chest pain, or change in stools.  In the ER, patient normotensive, afebrile, tachycardic 100-111, 100% on room air.  Labs noted for hemoglobin 2.1, white count 10.9, platelets 165, sodium 125, potassium 3.0, CO2 14, anion gap 18, EtOH 23, normal troponin, initial lactate of 8.05 increasing to 9.36, UA negative with 5 ketones and high specific gravity.  Troponin negative.  EKG with minimal diffuse ST depression.  Code sepsis initiated.  Chest x-ray without acute abnormalities. Treated empirically with Rocephin and Zithromax, 1.5 L normal saline bolus, Protonix 80 mg, briefly placed on octreotide drip, and GI consulted.  First of 2 units of  packed red blood cells infusing.  PCCM to admit to ICU.  PAST MEDICAL HISTORY :  He  has a past medical history of Alcohol abuse, Anemia due to GI blood loss (09/2011), Aortic insufficiency, Benign neoplasm of colon, Black stools (08/21/2015), Cachexia ([place]), Chest pain (08/21/2015), Dysphagia, ED (erectile dysfunction), Edema, GERD (gastroesophageal reflux disease), ILD (interstitial lung disease) ([place]), Iron deficiency anemia, unspecified, Pulmonary nodule, Reflux esophagitis, Smoker, Tobacco abuse, and Weight loss, abnormal.  PAST SURGICAL HISTORY: He  has a past surgical history that includes Esophagogastroduodenoscopy (10/02/2011); Colonoscopy (10/03/2011); Esophagogastroduodenoscopy (N/A, 01/05/2015); Colonoscopy with propofol (N/A, 08/22/2015); Esophagogastroduodenoscopy (egd) with propofol (N/A, 08/22/2015); Esophagogastroduodenoscopy (egd) with propofol (N/A, 09/19/2016); and G[place]ens capsule study (N/A, 09/19/2016).  No Known Allergies  No current facility-administered medications on file prior to encounter.    Current Outpatient Medications on File Prior to Encounter  Medication Sig  . ferrous sulfate 325 (65 FE) MG tablet Take 1 tablet (325 mg total) by mouth 2 (two) times daily with a meal. (Patient not taking: Reported on 09/09/2017)  . folic acid (FOLVITE) 1 MG tablet Take 1 tablet (1 mg total) by mouth daily. (Patient not taking: Reported on 09/09/2017)  . nicotine (NICODERM CQ - DOSED IN MG/24 HOURS) 21 mg/24hr patch Place 1 patch (21 mg total) onto the skin daily. (Patient not taking: Reported on 09/09/2017)  . pantoprazole (PROTONIX) 40 MG tablet Take 1 tablet (40 mg total) by mouth daily at 6 (six) AM. (Patient not taking: Reported on 09/09/2017)  . thiamine 100 MG tablet Take 1 tablet (100 mg total) by mouth daily. (Patient not taking: Reported on 09/09/2017)    FAMILY HISTORY:  His indicated that his mother is deceased. He indicated  that his father is alive. He indicated that the status of  his maternal grandfather is unknown.   SOCIAL HISTORY: He  reports that he has quit smoking. His smoking use included cigarettes. He has a 20.00 pack-year smoking history. he has never used smokeless tobacco. He reports that he drinks about 2.4 oz of alcohol per week. He reports that he uses drugs. Drug: Marijuana.  REVIEW OF SYSTEMS:  POSITIVES IN BOLD  Gen: Denies fever, chills, weight change, fatigue, night sweats, back pain HEENT: Denies blurred vision, double vision, hearing loss, tinnitus, sinus congestion, rhinorrhea, sore throat, neck stiffness, dysphagia PULM: Denies shortness of breath, cough, sputum production, hemoptysis, wheezing CV: Denies chest pain, edema, orthopnea, paroxysmal nocturnal dyspnea, palpitations GI: Denies abdominal pain, nausea, vomiting, diarrhea, hematochezia, melena, constipation, change in bowel habits, poor appetite  GU: Denies dysuria, hematuria, polyuria, oliguria, urethral discharge Endocrine: Denies hot or cold intolerance, polyuria, polyphagia or appetite change Derm: Denies rash, dry skin, scaling or peeling skin change Heme: Denies easy bruising, bleeding, bleeding gums Neuro: Denies headache, numbness, weakness, slurred speech, loss of memory or consciousness  SUBJECTIVE:    VITAL SIGNS: BP 101/61   Pulse (!) 104   Temp 98.2 F (36.8 C) (Oral)   Resp 18   Ht 5\' 9"  (1.753 m)   Wt 105 lb (47.6 kg)   SpO2 100%   BMI 15.51 kg/m   HEMODYNAMICS:    VENTILATOR SETTINGS:    INTAKE / OUTPUT: No intake/output data recorded.  PHYSICAL EXAMINATION: General:  Thin pleasant male In no acute distress, smelling of his own urine Neuro:  No focal deficit present, EOMI, CN exam ok HEENT:  Normocephalic, scleral icterus with conjuctival pallor, poor dentition Cardiovascular:  S1 and S2 appreciated unable to appreciate a murmur or gallop Lungs:  Clear to auscultation bilaterally Abdomen:  Soft scaphoid not distended no fluid wave  appreciated Musculoskeletal:  No gross deformities Skin:  Grossly intact  LABS:  BMET Recent Labs  Lab 09/09/17 1927  NA 125*  K 3.0*  CL 93*  CO2 14*  BUN <5*  CREATININE 0.65  GLUCOSE 103*    Electrolytes Recent Labs  Lab 09/09/17 1927  CALCIUM 8.5*    CBC Recent Labs  Lab 09/09/17 2050  WBC 10.9*  HGB 2.1*  HCT 8.8*  PLT 165    Coag's No results for input(s): APTT, INR in the last 168 hours.  Sepsis Markers Recent Labs  Lab 09/09/17 1942 09/09/17 2111  LATICACIDVEN 8.05* 9.36*    ABG No results for input(s): PHART, PCO2ART, PO2ART in the last 168 hours.  Liver Enzymes Recent Labs  Lab 09/09/17 1927  AST 68*  ALT 53  ALKPHOS 188*  BILITOT 1.1  ALBUMIN 3.0*    Cardiac Enzymes No results for input(s): TROPONINI, PROBNP in the last 168 hours.  Glucose Recent Labs  Lab 09/09/17 1931  GLUCAP 112*    Imaging Ct Chest W Contrast  Result Date: 09/09/2017 CLINICAL DATA:  Nausea and vomiting. Generalized weakness. Shortness of breath. Weight loss. EXAM: CT CHEST, ABDOMEN, AND PELVIS WITH CONTRAST TECHNIQUE: Multidetector CT imaging of the chest, abdomen and pelvis was performed following the standard protocol during bolus administration of intravenous contrast. CONTRAST:  121mL ISOVUE-300 IOPAMIDOL (ISOVUE-300) INJECTION 61% COMPARISON:  Chest radiograph earlier this day. Abdominal CT 08/21/2015 FINDINGS: CT CHEST FINDINGS Cardiovascular: Heart size is normal. Coronary artery calcifications versus stents. Thoracic aorta is normal in caliber without dissection. Mild calcified and noncalcified atheromatous plaque. Left vertebral artery arises  directly from the aorta, a normal variant. No pericardial fluid. Mediastinum/Nodes: No enlarged mediastinal or hilar lymph nodes. The esophagus is decompressed, small hiatal hernia. Visualized thyroid gland is normal. Lungs/Pleura: Mild apical predominant emphysema. Mild central bronchial wall thickening. Scattered  bilateral fissure oral thickening. No pulmonary mass or suspicious nodule. Minimal secretions in the trachea an bronchi. No focal consolidation. No pleural fluid or pulmonary edema. Musculoskeletal: Mild marrow heterogeneity without evident focal lytic or blastic lesion. CT ABDOMEN PELVIS FINDINGS Hepatobiliary: Diffusely decreased hepatic density. No focal hepatic lesion. Gallbladder physiologically distended, no calcified stone. No biliary dilatation. Pancreas: No ductal dilatation or inflammation. Spleen: Normal in size without focal abnormality. Adrenals/Urinary Tract: Mild bilateral adrenal thickening without dominant nodule. No hydronephrosis or perinephric edema. Homogeneous renal enhancement with symmetric excretion on delayed phase imaging. Urinary bladder is physiologically distended without wall thickening. Stomach/Bowel: Bowel evaluation is limited in the absence of enteric contrast. Diffuse gastric wall thickening, prominent about the greater curvature, similar to progressed from prior exam. 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 Majority of small bowel is nondistended. No evidence of small bowel inflammation. Minimal wall thickening of the proximal ascending colon without pericolonic inflammation. Small to moderate colonic stool burden. Vascular/Lymphatic: Moderate calcified and noncalcified aorta bi-iliac atherosclerosis. No aneurysm. Portal vein is grossly patent. No enlarged abdominal or pelvic lymph nodes. Reproductive: Prostatic calcifications. Other: No ascites or free air.  No intra-abdominal abscess. Musculoskeletal: Probable vertebral body hemangioma within L5. 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 No acute osseous abnormality. IMPRESSION: 1. Gastric wall thickening, similar to mildly progressed from prior exam. 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 2. Mild wall thickening of the ascending colon, may be secondary to nondistention or portal gastropathy. 3. Hepatic steatosis  and hepatomegaly. 4. Emphysema without acute chest finding. Aortic atherosclerosis and coronary artery calcification. Aortic Atherosclerosis (ICD10-I70.0) and Emphysema (ICD10-J43.9). Electronically Signed   By: Jeb Levering M.D.   On: 09/09/2017 22:04   Ct Abdomen Pelvis W Contrast  Result Date: 09/09/2017 CLINICAL DATA:  Nausea and vomiting. Generalized weakness. Shortness of breath. Weight loss. EXAM: CT CHEST, ABDOMEN, AND PELVIS WITH CONTRAST TECHNIQUE: Multidetector CT imaging of the chest, abdomen and pelvis was performed following the standard protocol during bolus administration of intravenous contrast. CONTRAST:  137mL ISOVUE-300 IOPAMIDOL (ISOVUE-300) INJECTION 61% COMPARISON:  Chest radiograph earlier this day. Abdominal CT 08/21/2015 FINDINGS: CT CHEST FINDINGS Cardiovascular: Heart size is normal. Coronary artery calcifications versus stents. Thoracic aorta is normal in caliber without dissection. Mild calcified and noncalcified atheromatous plaque. Left vertebral artery arises directly from the aorta, a normal variant. No pericardial fluid. Mediastinum/Nodes: No enlarged mediastinal or hilar lymph nodes. The esophagus is decompressed, small hiatal hernia. Visualized thyroid gland is normal. Lungs/Pleura: Mild apical predominant emphysema. Mild central bronchial wall thickening. Scattered bilateral fissure oral thickening. No pulmonary mass or suspicious nodule. Minimal secretions in the trachea an bronchi. No focal consolidation. No pleural fluid or pulmonary edema. Musculoskeletal: Mild marrow heterogeneity without evident focal lytic or blastic lesion. CT ABDOMEN PELVIS FINDINGS Hepatobiliary: Diffusely decreased hepatic density. No focal hepatic lesion. Gallbladder physiologically distended, no calcified stone. No biliary dilatation. Pancreas: No ductal dilatation or inflammation. Spleen: Normal in size without focal abnormality. Adrenals/Urinary Tract: Mild bilateral adrenal thickening  without dominant nodule. No hydronephrosis or perinephric edema. Homogeneous renal enhancement with symmetric excretion on delayed phase imaging. Urinary bladder is physiologically distended without wall thickening. Stomach/Bowel: Bowel evaluation is limited in the absence of enteric contrast. Diffuse gastric wall thickening, prominent about the greater curvature, similar  to progressed from prior exam. 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 Majority of small bowel is nondistended. No evidence of small bowel inflammation. Minimal wall thickening of the proximal ascending colon without pericolonic inflammation. Small to moderate colonic stool burden. Vascular/Lymphatic: Moderate calcified and noncalcified aorta bi-iliac atherosclerosis. No aneurysm. Portal vein is grossly patent. No enlarged abdominal or pelvic lymph nodes. Reproductive: Prostatic calcifications. Other: No ascites or free air.  No intra-abdominal abscess. Musculoskeletal: Probable vertebral body hemangioma within L5. 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 No acute osseous abnormality. IMPRESSION: 1. Gastric wall thickening, similar to mildly progressed from prior exam. 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 2. Mild wall thickening of the ascending colon, may be secondary to nondistention or portal gastropathy. 3. Hepatic steatosis and hepatomegaly. 4. Emphysema without acute chest finding. Aortic atherosclerosis and coronary artery calcification. Aortic Atherosclerosis (ICD10-I70.0) and Emphysema (ICD10-J43.9). Electronically Signed   By: Jeb Levering M.D.   On: 09/09/2017 22:04   Dg Chest Port 1 View  Result Date: 09/09/2017 CLINICAL DATA:  Fever, cough EXAM: PORTABLE CHEST 1 VIEW COMPARISON:  Portable exam 2021 hours compared to 08/20/2015 FINDINGS: Normal heart size, mediastinal contours, and pulmonary vascularity. Hyperinflated lungs with minimal chronic interstitial prominence. No acute infiltrate, pleural effusion or  pneumothorax. Bones demineralized. IMPRESSION: Hyperinflation and minimal chronic interstitial prominence without acute abnormalities. Electronically Signed   By: Lavonia Dana M.D.   On: 09/09/2017 20:36   STUDIES:  CT abd/ pelvis 09/09/2017 >>  1. Gastric wall thickening, similar to mildly progressed from prior exam. 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 2. Mild wall thickening of the ascending colon, may be secondary to nondistention or portal gastropathy. 3. Hepatic steatosis and hepatomegaly. 4. Emphysema without acute chest finding. Aortic atherosclerosis and coronary artery calcification. Aortic Atherosclerosis   CULTURES: MRSA PCR 09/10/2017 >> UC 09/10/2017 >> BC x2 09/10/2017 >>  ANTIBIOTICS: 1/2 azithro >> 1/2 ceftriaxone >>  SIGNIFICANT EVENTS: 1/2  Admit  LINES/TUBES: PIV x 2  DISCUSSION: 8 yoM with chronic ETOH abuse, smoker, THC abuse presenting with cough, chills, back pain and weight loss found to have Hgb 2.1, hemodynamically stable, GI consulted  ASSESSMENT / PLAN:  PULMONARY A: Tobacco abuse Hx R lung nodule and ? RBILD P:   Supplemental O2 prn  Duonebs prn  Nicoderm patch Ongoing tobacco cessation counseling Recommend on follow-up outpatient for workup of suspected ILD- no pulm nodule noted on CT this admit  CARDIOVASCULAR A:  SIRS - last TTE 12/2014 w/ EF 55-65%, G1DD, mod AR, mild TR P:  Tele monitoring Goal MAP > 65 Trend lactate   RENAL A:   Hypokalemia  AGMA/ lactic acidosis  Hyponatremia- likely from chronic ETOH use - S/p 1.5 L  P:   S/p replete K  Trend BMP / mag/ urinary output/ daily weights Replace electrolytes as indicated Avoid nephrotoxic agents, ensure adequate renal perfusion  GASTROINTESTINAL A:   ETOH abuse Suspected UGIB- CT concerning for variceal vs gastric bleed - has been hemodynamically stable despite Hgb of 2- suspect slow bleed given his compensation thus far Weight loss- subjective 40 lbs over 6 months H/O EGD in  2016 and 2018 In Jan 2018 EGD showed Gastroppathy and,minor pinpoint heme seen in proximal duodenum.  Hx of esophagitis, PUD Mild Transaminitis  P:   GI consult appreciated- anticipate EGD in am  NPO PPI BID  Trend LFT Folate, thiamine, MVI daily   HEMATOLOGIC A:   ABLA- suspect slow bleed, otherwise pt would acutely symptomatic with Hgb 2.1.  Mild leukocytosis Hx  anemia, IDA? - HIV neg P:  S/p 1 unit of PRBC transfused w/ additional 1 unit pending H/H q 8 Pending  coags Hold anticoagulation  SCDs for VTE ppx   INFECTIOUS A:   Leukocytosis - afebrile, cxr neg, ua neg, low suspicion for SBP - no ascites  P:   Empiric azithro/ ceftriaxone Trend PCT Follow cultures  Trend WBC/ fever curve   ENDOCRINE A:   At risk for hypoglycemia  P:   CBG q 4 while NPO  NEUROLOGIC A:   ETOH/ illicit drug abuse Concern for ETOH withdrawals  ? Depression w/ weight loss and substance abuse - currently low CIWA - ETOH on admit 23 P:   ICU alcohol withdrawal protocol w/ ativan Seizure precautions Folate, thiamine, MVI as above  Frequent neuro checks   FAMILY  - Updates:   - Inter-disciplinary family meet or Palliative Care meeting due by:  09/17/2016     I, Dr Seward Carol have personally reviewed patient's available data, including medical history, events of note, physical examination and test results as part of my evaluation. I have discussed with NP Moshe Cipro and other care providers such as pharmacist, RN and RRT.  In addition,  I personally evaluated patient. Please see Assessment and Plan above. The patient is critically ill with multiple organ systems failure and requires high complexity decision making for assessment and support, frequent evaluation and titration of therapies, application of advanced monitoring technologies and extensive interpretation of multiple databases.  Critical Care Time devoted to patient care services described in this note is 19 Minutes. This  time reflects time of care of this signee Dr Seward Carol. This critical care time does not reflect procedure time, or teaching time or supervisory time of NP but could involve care discussion time.  Disposition: ICU CC time: 55 minutes CODE STATUS: Full Prognosis: Poor Family: Not at bedside I had an extensive conversation with the patient regarding his critical condition we also discussed his EtOH use and tobacco use.  He states that the last time he had a drink was on 09/08/2017 he denies ever having any withdrawal symptoms, hallucinations, fevers, and tongue fasciculations, given extensive EtOH use and history we will start CIWA protocol.   Dr. Seward Carol Pulmonary Critical Care Medicine  09/10/2017 5:57 AM

## 2017-09-10 NOTE — ED Notes (Signed)
Md at bedside

## 2017-09-11 ENCOUNTER — Encounter (HOSPITAL_COMMUNITY)
Admission: EM | Disposition: A | Discharge status: Home / Self Care | Payer: Self-pay | Source: Home / Self Care | Attending: Family Medicine

## 2017-09-11 ENCOUNTER — Inpatient Hospital Stay (HOSPITAL_COMMUNITY): Payer: BC Managed Care – PPO

## 2017-09-11 DIAGNOSIS — I864 Gastric varices: Secondary | ICD-10-CM

## 2017-09-11 HISTORY — PX: GIVENS CAPSULE STUDY: SHX5432

## 2017-09-11 LAB — CBC
HCT: 25.3 % — ABNORMAL LOW (ref 39.0–52.0)
HEMOGLOBIN: 8 g/dL — AB (ref 13.0–17.0)
MCH: 24.1 pg — AB (ref 26.0–34.0)
MCHC: 31.6 g/dL (ref 30.0–36.0)
MCV: 76.2 fL — ABNORMAL LOW (ref 78.0–100.0)
PLATELETS: 139 10*3/uL — AB (ref 150–400)
RBC: 3.32 MIL/uL — ABNORMAL LOW (ref 4.22–5.81)
RDW: 24.9 % — ABNORMAL HIGH (ref 11.5–15.5)
WBC: 8.6 10*3/uL (ref 4.0–10.5)

## 2017-09-11 LAB — BASIC METABOLIC PANEL
ANION GAP: 8 (ref 5–15)
BUN: <5 mg/dL — ABNORMAL LOW (ref 6–20)
CALCIUM: 7.6 mg/dL — AB (ref 8.9–10.3)
CO2: 18 mmol/L — AB (ref 22–32)
CREATININE: 0.5 mg/dL — AB (ref 0.61–1.24)
Chloride: 99 mmol/L — ABNORMAL LOW (ref 101–111)
GFR calc non Af Amer: >60 mL/min (ref >60)
GLUCOSE: 93 mg/dL (ref 65–99)
Potassium: 3.3 mmol/L — ABNORMAL LOW (ref 3.5–5.1)
Sodium: 125 mmol/L — ABNORMAL LOW (ref 135–145)

## 2017-09-11 LAB — GLUCOSE, CAPILLARY
GLUCOSE-CAPILLARY: 143 mg/dL — AB (ref 65–99)
GLUCOSE-CAPILLARY: 158 mg/dL — AB (ref 65–99)
GLUCOSE-CAPILLARY: 158 mg/dL — AB (ref 65–99)
Glucose-Capillary: 84 mg/dL (ref 65–99)
Glucose-Capillary: 103 mg/dL — ABNORMAL HIGH (ref 65–99)
Glucose-Capillary: 106 mg/dL — ABNORMAL HIGH (ref 65–99)

## 2017-09-11 SURGERY — IMAGING PROCEDURE, GI TRACT, INTRALUMINAL, VIA CAPSULE
Anesthesia: LOCAL

## 2017-09-11 MED ORDER — POTASSIUM CHLORIDE CRYS ER 20 MEQ PO TBCR
40.0000 meq | EXTENDED_RELEASE_TABLET | Freq: Once | ORAL | Status: AC
  Administered 2017-09-11: 40 meq via ORAL
  Filled 2017-09-11: qty 2

## 2017-09-11 MED ORDER — ALBUTEROL SULFATE (2.5 MG/3ML) 0.083% IN NEBU: 2.5000 mg | INHALATION_SOLUTION | RESPIRATORY_TRACT | Status: DC | PRN

## 2017-09-11 SURGICAL SUPPLY — 1 items: TOWEL COTTON PACK 4EA (MISCELLANEOUS) ×4 IMPLANT

## 2017-09-11 NOTE — Progress Notes (Signed)
GI paged regarding diet order after capsule endoscopy has finished, per verbal order from [place], MD patient allowed to have heart healthy diet after 1750.  1848: McClung MD made aware via text page that pt had 23 beats wide QRS SVT on telemetry. Patient is asymptomatic. K replaced earlier in shift. Will continue to monitor closely.

## 2017-09-11 NOTE — Progress Notes (Signed)
D/w Dr Dorothy Puffer - pccm can sign off  Dr. Brand Males, M.D., [facility].C.P Pulmonary and Critical Care Medicine Staff Physician, [facility] Director - Interstitial Lung Disease  Program  [facility] [place], [place], [address]  Pager: [phone], If no answer or between  15:00h - 7:00h: call [phone] Telephone: [phone]   g

## 2017-09-11 NOTE — H&P (View-Only) (Signed)
Subjective: Feeling well.  No complaintas.  Objective: Vital signs in last 24 hours: Temp:  [97.7 F (36.5 C)-99.3 F (37.4 C)] 97.7 F (36.5 C) (01/04 0800) Pulse Rate:  [83-100] 91 (01/04 0526) Resp:  [17-23] 20 (01/04 0526) BP: (93-112)/(59-70) 111/63 (01/04 0800) SpO2:  [91 %-100 %] 97 % (01/04 0800) Weight:  [51.6 kg (113 lb 12.1 oz)] 51.6 kg (113 lb 12.1 oz) (01/04 0526) Last BM Date: 09/09/17  Intake/Output from previous day: 01/03 0701 - 01/04 0700 In: 1868.1 [P.O.:474; I.V.:779.1; Blood:315; IV Piggyback:300] Out: 400 [Urine:400] Intake/Output this shift: No intake/output data recorded.  General appearance: alert and no distress GI: soft, non-tender; bowel sounds normal; no masses,  no organomegaly  Lab Results: Recent Labs    09/09/17 2050 09/10/17 0832 09/10/17 2158 09/11/17 0724  WBC 10.9* 7.8  --  8.6  HGB 2.1* 7.1*  7.1* 6.7* 8.0*  HCT 8.8* 22.4*  22.2* 21.7* 25.3*  PLT 165 114*  --  139*   BMET Recent Labs    09/09/17 1927 09/10/17 0832 09/11/17 0724  NA 125* 127* 125*  K 3.0* 3.7 3.3*  CL 93* 99* 99*  CO2 14* 20* 18*  GLUCOSE 103* 97 93  BUN <5* <5* <5*  CREATININE 0.65 0.62 0.50*  CALCIUM 8.5* 7.4* 7.6*   LFT Recent Labs    09/10/17 0832  PROT 6.1*  ALBUMIN 2.4*  AST 56*  ALT 45  ALKPHOS 149*  BILITOT 3.0*   PT/INR Recent Labs    09/10/17 0832  LABPROT 15.6*  INR 1.26   Hepatitis Panel No results for input(s): HEPBSAG, HCVAB, HEPAIGM, HEPBIGM in the last 72 hours. C-Diff No results for input(s): CDIFFTOX in the last 72 hours. Fecal Lactopherrin No results for input(s): FECLLACTOFRN in the last 72 hours.  Studies/Results: Ct Chest W Contrast  Result Date: 09/09/2017 CLINICAL DATA:  Nausea and vomiting. Generalized weakness. Shortness of breath. Weight loss. EXAM: CT CHEST, ABDOMEN, AND PELVIS WITH CONTRAST TECHNIQUE: Multidetector CT imaging of the chest, abdomen and pelvis was performed following the standard protocol  during bolus administration of intravenous contrast. CONTRAST:  176mL ISOVUE-300 IOPAMIDOL (ISOVUE-300) INJECTION 61% COMPARISON:  Chest radiograph earlier this day. Abdominal CT 08/21/2015 FINDINGS: CT CHEST FINDINGS Cardiovascular: Heart size is normal. Coronary artery calcifications versus stents. Thoracic aorta is normal in caliber without dissection. Mild calcified and noncalcified atheromatous plaque. Left vertebral artery arises directly from the aorta, a normal variant. No pericardial fluid. Mediastinum/Nodes: No enlarged mediastinal or hilar lymph nodes. The esophagus is decompressed, small hiatal hernia. Visualized thyroid gland is normal. Lungs/Pleura: Mild apical predominant emphysema. Mild central bronchial wall thickening. Scattered bilateral fissure oral thickening. No pulmonary mass or suspicious nodule. Minimal secretions in the trachea an bronchi. No focal consolidation. No pleural fluid or pulmonary edema. Musculoskeletal: Mild marrow heterogeneity without evident focal lytic or blastic lesion. CT ABDOMEN PELVIS FINDINGS Hepatobiliary: Diffusely decreased hepatic density. No focal hepatic lesion. Gallbladder physiologically distended, no calcified stone. No biliary dilatation. Pancreas: No ductal dilatation or inflammation. Spleen: Normal in size without focal abnormality. Adrenals/Urinary Tract: Mild bilateral adrenal thickening without dominant nodule. No hydronephrosis or perinephric edema. Homogeneous renal enhancement with symmetric excretion on delayed phase imaging. Urinary bladder is physiologically distended without wall thickening. Stomach/Bowel: Bowel evaluation is limited in the absence of enteric contrast. Diffuse gastric wall thickening, prominent about the greater curvature, similar to progressed from prior exam. 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 Majority of small bowel is nondistended. No evidence of small bowel inflammation. Minimal wall thickening  of the proximal ascending colon  without pericolonic inflammation. Small to moderate colonic stool burden. Vascular/Lymphatic: Moderate calcified and noncalcified aorta bi-iliac atherosclerosis. No aneurysm. Portal vein is grossly patent. No enlarged abdominal or pelvic lymph nodes. Reproductive: Prostatic calcifications. Other: No ascites or free air.  No intra-abdominal abscess. Musculoskeletal: Probable vertebral body hemangioma within L5. 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 No acute osseous abnormality. IMPRESSION: 1. Gastric wall thickening, similar to mildly progressed from prior exam. 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 2. Mild wall thickening of the ascending colon, may be secondary to nondistention or portal gastropathy. 3. Hepatic steatosis and hepatomegaly. 4. Emphysema without acute chest finding. Aortic atherosclerosis and coronary artery calcification. Aortic Atherosclerosis (ICD10-I70.0) and Emphysema (ICD10-J43.9). Electronically Signed   By: Jeb Levering M.D.   On: 09/09/2017 22:04   Ct Abdomen Pelvis W Contrast  Result Date: 09/09/2017 CLINICAL DATA:  Nausea and vomiting. Generalized weakness. Shortness of breath. Weight loss. EXAM: CT CHEST, ABDOMEN, AND PELVIS WITH CONTRAST TECHNIQUE: Multidetector CT imaging of the chest, abdomen and pelvis was performed following the standard protocol during bolus administration of intravenous contrast. CONTRAST:  152mL ISOVUE-300 IOPAMIDOL (ISOVUE-300) INJECTION 61% COMPARISON:  Chest radiograph earlier this day. Abdominal CT 08/21/2015 FINDINGS: CT CHEST FINDINGS Cardiovascular: Heart size is normal. Coronary artery calcifications versus stents. Thoracic aorta is normal in caliber without dissection. Mild calcified and noncalcified atheromatous plaque. Left vertebral artery arises directly from the aorta, a normal variant. No pericardial fluid. Mediastinum/Nodes: No enlarged mediastinal or hilar lymph nodes. The esophagus is decompressed, small hiatal  hernia. Visualized thyroid gland is normal. Lungs/Pleura: Mild apical predominant emphysema. Mild central bronchial wall thickening. Scattered bilateral fissure oral thickening. No pulmonary mass or suspicious nodule. Minimal secretions in the trachea an bronchi. No focal consolidation. No pleural fluid or pulmonary edema. Musculoskeletal: Mild marrow heterogeneity without evident focal lytic or blastic lesion. CT ABDOMEN PELVIS FINDINGS Hepatobiliary: Diffusely decreased hepatic density. No focal hepatic lesion. Gallbladder physiologically distended, no calcified stone. No biliary dilatation. Pancreas: No ductal dilatation or inflammation. Spleen: Normal in size without focal abnormality. Adrenals/Urinary Tract: Mild bilateral adrenal thickening without dominant nodule. No hydronephrosis or perinephric edema. Homogeneous renal enhancement with symmetric excretion on delayed phase imaging. Urinary bladder is physiologically distended without wall thickening. Stomach/Bowel: Bowel evaluation is limited in the absence of enteric contrast. Diffuse gastric wall thickening, prominent about the greater curvature, similar to progressed from prior exam. 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 Majority of small bowel is nondistended. No evidence of small bowel inflammation. Minimal wall thickening of the proximal ascending colon without pericolonic inflammation. Small to moderate colonic stool burden. Vascular/Lymphatic: Moderate calcified and noncalcified aorta bi-iliac atherosclerosis. No aneurysm. Portal vein is grossly patent. No enlarged abdominal or pelvic lymph nodes. Reproductive: Prostatic calcifications. Other: No ascites or free air.  No intra-abdominal abscess. Musculoskeletal: Probable vertebral body hemangioma within L5. 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 No acute osseous abnormality. IMPRESSION: 1. Gastric wall thickening, similar to mildly progressed from prior exam. There probable  perigastric varices. 2. Mild wall thickening of the ascending colon, may be secondary to nondistention or portal gastropathy. 3. Hepatic steatosis and hepatomegaly. 4. Emphysema without acute chest finding. Aortic atherosclerosis and coronary artery calcification. Aortic Atherosclerosis (ICD10-I70.0) and Emphysema (ICD10-J43.9). Electronically Signed   By: Jeb Levering M.D.   On: 09/09/2017 22:04   Dg Chest Port 1 View  Result Date: 09/11/2017 CLINICAL DATA:  Cough. EXAM: PORTABLE CHEST 1 VIEW COMPARISON:  CT scan and radiograph of September 09, 2017. FINDINGS: Stable cardiomediastinal silhouette. No pneumothorax or pleural effusion is noted. Mildly increased bilateral perihilar and infrahilar opacities are noted suggesting worsening edema or inflammation. Bony thorax is unremarkable. IMPRESSION: Mildly increased bilateral perihilar and infrahilar opacities are noted suggesting worsening inflammation or edema. Electronically Signed   By: Marijo Conception, M.D.   On: 09/11/2017 09:08   Dg Chest Port 1 View  Result Date: 09/09/2017 CLINICAL DATA:  Fever, cough EXAM: PORTABLE CHEST 1 VIEW COMPARISON:  Portable exam 2021 hours compared to 08/20/2015 FINDINGS: Normal heart size, mediastinal contours, and pulmonary vascularity. Hyperinflated lungs with minimal chronic interstitial prominence. No acute infiltrate, pleural effusion or pneumothorax. Bones demineralized. IMPRESSION: Hyperinflation and minimal chronic interstitial prominence without acute abnormalities. Electronically Signed   By: Lavonia Dana M.D.   On: 09/09/2017 20:36    Medications:  Scheduled: . feeding supplement  1 Container Oral TID BM  . folic acid  1 mg Oral Daily  . LORazepam  0-4 mg Intravenous Q6H   Followed by  . [START ON 09/12/2017] LORazepam  0-4 mg Intravenous Q12H  . multivitamin with minerals  1 tablet Oral Daily  . nicotine  14 mg Transdermal Daily  . [START ON 09/13/2017] pantoprazole  40 mg Intravenous Q12H  . thiamine  100  mg Oral Daily   Or  . thiamine  100 mg Intravenous Daily   Continuous: . sodium chloride    . azithromycin Stopped (09/11/17 0216)  . cefTRIAXone (ROCEPHIN)  IV Stopped (09/11/17 0004)  . octreotide  (SANDOSTATIN)    IV infusion 50 mcg/hr (09/11/17 0449)    Assessment/Plan: 1) Severe IDA.   HGB did drop, but this may be from equilibration.  No overt reports of GI bleeding.  Plan: 1) VCE today.  LOS: 1 day   Erland Vivas D 09/11/2017, 10:07 AM

## 2017-09-11 NOTE — Progress Notes (Signed)
Subjective: Feeling well.  No complaintas.  Objective: Vital signs in last 24 hours: Temp:  [97.7 F (36.5 C)-99.3 F (37.4 C)] 97.7 F (36.5 C) (01/04 0800) Pulse Rate:  [83-100] 91 (01/04 0526) Resp:  [17-23] 20 (01/04 0526) BP: (93-112)/(59-70) 111/63 (01/04 0800) SpO2:  [91 %-100 %] 97 % (01/04 0800) Weight:  [51.6 kg (113 lb 12.1 oz)] 51.6 kg (113 lb 12.1 oz) (01/04 0526) Last BM Date: 09/09/17  Intake/Output from previous day: 01/03 0701 - 01/04 0700 In: 1868.1 [P.O.:474; I.V.:779.1; Blood:315; IV Piggyback:300] Out: 400 [Urine:400] Intake/Output this shift: No intake/output data recorded.  General appearance: alert and no distress GI: soft, non-tender; bowel sounds normal; no masses,  no organomegaly  Lab Results: Recent Labs    09/09/17 2050 09/10/17 0832 09/10/17 2158 09/11/17 0724  WBC 10.9* 7.8  --  8.6  HGB 2.1* 7.1*  7.1* 6.7* 8.0*  HCT 8.8* 22.4*  22.2* 21.7* 25.3*  PLT 165 114*  --  139*   BMET Recent Labs    09/09/17 1927 09/10/17 0832 09/11/17 0724  NA 125* 127* 125*  K 3.0* 3.7 3.3*  CL 93* 99* 99*  CO2 14* 20* 18*  GLUCOSE 103* 97 93  BUN <5* <5* <5*  CREATININE 0.65 0.62 0.50*  CALCIUM 8.5* 7.4* 7.6*   LFT Recent Labs    09/10/17 0832  PROT 6.1*  ALBUMIN 2.4*  AST 56*  ALT 45  ALKPHOS 149*  BILITOT 3.0*   PT/INR Recent Labs    09/10/17 0832  LABPROT 15.6*  INR 1.26   Hepatitis Panel No results for input(s): HEPBSAG, HCVAB, HEPAIGM, HEPBIGM in the last 72 hours. C-Diff No results for input(s): CDIFFTOX in the last 72 hours. Fecal Lactopherrin No results for input(s): FECLLACTOFRN in the last 72 hours.  Studies/Results: Ct Chest W Contrast  Result Date: 09/09/2017 CLINICAL DATA:  Nausea and vomiting. Generalized weakness. Shortness of breath. Weight loss. EXAM: CT CHEST, ABDOMEN, AND PELVIS WITH CONTRAST TECHNIQUE: Multidetector CT imaging of the chest, abdomen and pelvis was performed following the standard protocol  during bolus administration of intravenous contrast. CONTRAST:  131mL ISOVUE-300 IOPAMIDOL (ISOVUE-300) INJECTION 61% COMPARISON:  Chest radiograph earlier this day. Abdominal CT 08/21/2015 FINDINGS: CT CHEST FINDINGS Cardiovascular: Heart size is normal. Coronary artery calcifications versus stents. Thoracic aorta is normal in caliber without dissection. Mild calcified and noncalcified atheromatous plaque. Left vertebral artery arises directly from the aorta, a normal variant. No pericardial fluid. Mediastinum/Nodes: No enlarged mediastinal or hilar lymph nodes. The esophagus is decompressed, small hiatal hernia. Visualized thyroid gland is normal. Lungs/Pleura: Mild apical predominant emphysema. Mild central bronchial wall thickening. Scattered bilateral fissure oral thickening. No pulmonary mass or suspicious nodule. Minimal secretions in the trachea an bronchi. No focal consolidation. No pleural fluid or pulmonary edema. Musculoskeletal: Mild marrow heterogeneity without evident focal lytic or blastic lesion. CT ABDOMEN PELVIS FINDINGS Hepatobiliary: Diffusely decreased hepatic density. No focal hepatic lesion. Gallbladder physiologically distended, no calcified stone. No biliary dilatation. Pancreas: No ductal dilatation or inflammation. Spleen: Normal in size without focal abnormality. Adrenals/Urinary Tract: Mild bilateral adrenal thickening without dominant nodule. No hydronephrosis or perinephric edema. Homogeneous renal enhancement with symmetric excretion on delayed phase imaging. Urinary bladder is physiologically distended without wall thickening. Stomach/Bowel: Bowel evaluation is limited in the absence of enteric contrast. Diffuse gastric wall thickening, prominent about the greater curvature, similar to progressed from prior exam. 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 Majority of small bowel is nondistended. No evidence of small bowel inflammation. Minimal wall thickening  of the proximal ascending colon  without pericolonic inflammation. Small to moderate colonic stool burden. Vascular/Lymphatic: Moderate calcified and noncalcified aorta bi-iliac atherosclerosis. No aneurysm. Portal vein is grossly patent. No enlarged abdominal or pelvic lymph nodes. Reproductive: Prostatic calcifications. Other: No ascites or free air.  No intra-abdominal abscess. Musculoskeletal: Probable vertebral body hemangioma within L5. 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 No acute osseous abnormality. IMPRESSION: 1. Gastric wall thickening, similar to mildly progressed from prior exam. 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 2. Mild wall thickening of the ascending colon, may be secondary to nondistention or portal gastropathy. 3. Hepatic steatosis and hepatomegaly. 4. Emphysema without acute chest finding. Aortic atherosclerosis and coronary artery calcification. Aortic Atherosclerosis (ICD10-I70.0) and Emphysema (ICD10-J43.9). Electronically Signed   By: Jeb Levering M.D.   On: 09/09/2017 22:04   Ct Abdomen Pelvis W Contrast  Result Date: 09/09/2017 CLINICAL DATA:  Nausea and vomiting. Generalized weakness. Shortness of breath. Weight loss. EXAM: CT CHEST, ABDOMEN, AND PELVIS WITH CONTRAST TECHNIQUE: Multidetector CT imaging of the chest, abdomen and pelvis was performed following the standard protocol during bolus administration of intravenous contrast. CONTRAST:  164mL ISOVUE-300 IOPAMIDOL (ISOVUE-300) INJECTION 61% COMPARISON:  Chest radiograph earlier this day. Abdominal CT 08/21/2015 FINDINGS: CT CHEST FINDINGS Cardiovascular: Heart size is normal. Coronary artery calcifications versus stents. Thoracic aorta is normal in caliber without dissection. Mild calcified and noncalcified atheromatous plaque. Left vertebral artery arises directly from the aorta, a normal variant. No pericardial fluid. Mediastinum/Nodes: No enlarged mediastinal or hilar lymph nodes. The esophagus is decompressed, small hiatal  hernia. Visualized thyroid gland is normal. Lungs/Pleura: Mild apical predominant emphysema. Mild central bronchial wall thickening. Scattered bilateral fissure oral thickening. No pulmonary mass or suspicious nodule. Minimal secretions in the trachea an bronchi. No focal consolidation. No pleural fluid or pulmonary edema. Musculoskeletal: Mild marrow heterogeneity without evident focal lytic or blastic lesion. CT ABDOMEN PELVIS FINDINGS Hepatobiliary: Diffusely decreased hepatic density. No focal hepatic lesion. Gallbladder physiologically distended, no calcified stone. No biliary dilatation. Pancreas: No ductal dilatation or inflammation. Spleen: Normal in size without focal abnormality. Adrenals/Urinary Tract: Mild bilateral adrenal thickening without dominant nodule. No hydronephrosis or perinephric edema. Homogeneous renal enhancement with symmetric excretion on delayed phase imaging. Urinary bladder is physiologically distended without wall thickening. Stomach/Bowel: Bowel evaluation is limited in the absence of enteric contrast. Diffuse gastric wall thickening, prominent about the greater curvature, similar to progressed from prior exam. 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 Majority of small bowel is nondistended. No evidence of small bowel inflammation. Minimal wall thickening of the proximal ascending colon without pericolonic inflammation. Small to moderate colonic stool burden. Vascular/Lymphatic: Moderate calcified and noncalcified aorta bi-iliac atherosclerosis. No aneurysm. Portal vein is grossly patent. No enlarged abdominal or pelvic lymph nodes. Reproductive: Prostatic calcifications. Other: No ascites or free air.  No intra-abdominal abscess. Musculoskeletal: Probable vertebral body hemangioma within L5. 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 No acute osseous abnormality. IMPRESSION: 1. Gastric wall thickening, similar to mildly progressed from prior exam. There probable  perigastric varices. 2. Mild wall thickening of the ascending colon, may be secondary to nondistention or portal gastropathy. 3. Hepatic steatosis and hepatomegaly. 4. Emphysema without acute chest finding. Aortic atherosclerosis and coronary artery calcification. Aortic Atherosclerosis (ICD10-I70.0) and Emphysema (ICD10-J43.9). Electronically Signed   By: Jeb Levering M.D.   On: 09/09/2017 22:04   Dg Chest Port 1 View  Result Date: 09/11/2017 CLINICAL DATA:  Cough. EXAM: PORTABLE CHEST 1 VIEW COMPARISON:  CT scan and radiograph of September 09, 2017. FINDINGS: Stable cardiomediastinal silhouette. No pneumothorax or pleural effusion is noted. Mildly increased bilateral perihilar and infrahilar opacities are noted suggesting worsening edema or inflammation. Bony thorax is unremarkable. IMPRESSION: Mildly increased bilateral perihilar and infrahilar opacities are noted suggesting worsening inflammation or edema. Electronically Signed   By: Marijo Conception, M.D.   On: 09/11/2017 09:08   Dg Chest Port 1 View  Result Date: 09/09/2017 CLINICAL DATA:  Fever, cough EXAM: PORTABLE CHEST 1 VIEW COMPARISON:  Portable exam 2021 hours compared to 08/20/2015 FINDINGS: Normal heart size, mediastinal contours, and pulmonary vascularity. Hyperinflated lungs with minimal chronic interstitial prominence. No acute infiltrate, pleural effusion or pneumothorax. Bones demineralized. IMPRESSION: Hyperinflation and minimal chronic interstitial prominence without acute abnormalities. Electronically Signed   By: Lavonia Dana M.D.   On: 09/09/2017 20:36    Medications:  Scheduled: . feeding supplement  1 Container Oral TID BM  . folic acid  1 mg Oral Daily  . LORazepam  0-4 mg Intravenous Q6H   Followed by  . [START ON 09/12/2017] LORazepam  0-4 mg Intravenous Q12H  . multivitamin with minerals  1 tablet Oral Daily  . nicotine  14 mg Transdermal Daily  . [START ON 09/13/2017] pantoprazole  40 mg Intravenous Q12H  . thiamine  100  mg Oral Daily   Or  . thiamine  100 mg Intravenous Daily   Continuous: . sodium chloride    . azithromycin Stopped (09/11/17 0216)  . cefTRIAXone (ROCEPHIN)  IV Stopped (09/11/17 0004)  . octreotide  (SANDOSTATIN)    IV infusion 50 mcg/hr (09/11/17 0449)    Assessment/Plan: 1) Severe IDA.   HGB did drop, but this may be from equilibration.  No overt reports of GI bleeding.  Plan: 1) VCE today.  LOS: 1 day   Caiden Monsivais D 09/11/2017, 10:07 AM

## 2017-09-11 NOTE — Progress Notes (Signed)
Notified MD of critical Hgb 6.7

## 2017-09-11 NOTE — Progress Notes (Addendum)
James [name] TEAM 1 - Stepdown/ICU TEAM  AVID GUILLETTE  ZOX:[id number] DOB: March 29, 1956 DOA: 09/09/2017 PCP: James Lung, MD    Brief Narrative:  62 year old alcoholic with heavy THC use admitted with profound weakness, dyspnea and overall failure to thrive with malaise.  He has a history of esophagitis, peptic ulcer disease and chronic anemia.  At presentation he was found to be profoundly anemic with a hemoglobin 2.1.  No overt source of blood loss.  He received volume resuscitation, packed red blood cells with some clinical improvement.  He was treated empirically with Protonix and briefly placed on octreotide.  Gastroenterology was consulted.  Significant Events: 1/2 Admit with cough, chills, weakness, weight loss > hgb 2.1 1/4 [facility] assumed care  Subjective: Resting comfortably in bed.  Has just completed his capsule endoscopy.  Reports that he is very hungry.  Denies chest pain shortness of breath fevers chills nausea or vomiting.  Assessment & Plan:  Suspected UGIB CT concerning for variceal vs gastric bleed - H/O EGD in 2016 and 2018 w/ only one showing an H pylori related gastric ulcer which has since been proven to be healed - capsule endoscopy today per GI  Profound anemia - hx of recurrent anemia  S/p 5U PRBC thus far - Hgb stable today - cont to follow trend   Recent Labs  Lab 09/09/17 2050 09/10/17 0832 09/10/17 2158 09/11/17 0724  HGB 2.1* 7.1*  7.1* 6.7* 8.0*    Tobacco Abuse - Hx R [name] nodule and ? RBILD - ?PNA v/s bronchitis  Follow up as outpatient for suspected ILD - d/c abx after 3 days of tx    Hypokalemia  Cont to supplement and follow - check Mg in AM  Hyponatremia likely from chronic ETOH use - follow w/o attempting to normalize   ETOH abuse - Illicit drug abuse  Weight loss subjective 40 lbs over 6 months - GI w/u underway - ?due to substance abuse   DVT prophylaxis: SCDs Code Status: FULL CODE Family Communication: no family present at time  of exam  Disposition Plan: Tele   Consultants:  PCCM GI - James [name]   Antimicrobials:  1/2 azithro > 1/2 ceftriaxone >  Objective: Blood pressure 106/68, pulse 91, temperature 98.3 F (36.8 C), temperature source Oral, resp. rate 20, height 5\' 10"  (1.778 m), weight 51.6 kg (113 lb 12.1 oz), SpO2 97 %.  Intake/Output Summary (Last 24 hours) at 09/11/2017 1347 Last data filed at 09/11/2017 8119 Gross per 24 hour  Intake 1793.08 ml  Output 250 ml  Net 1543.08 ml   Filed Weights   09/09/17 1921 09/10/17 0230 09/11/17 0526  Weight: 47.6 kg (105 lb) 49.9 kg (110 lb 0.2 oz) 51.6 kg (113 lb 12.1 oz)    Examination: General: No acute respiratory distress Lungs: Clear to auscultation bilaterally without wheezes or crackles Cardiovascular: Regular rate and rhythm without murmur gallop or rub normal S1 and S2 Abdomen: Nontender, nondistended, soft, bowel sounds positive, no rebound, no ascites, no appreciable mass Extremities: No significant cyanosis, clubbing, or edema bilateral lower extremities  CBC: Recent Labs  Lab 09/09/17 2050 09/10/17 0832 09/10/17 2158 09/11/17 0724  WBC 10.9* 7.8  --  8.6  NEUTROABS 9.0*  --   --   --   HGB 2.1* 7.1*  7.1* 6.7* 8.0*  HCT 8.8* 22.4*  22.2* 21.7* 25.3*  MCV 59.1* 74.9*  --  76.2*  PLT 165 114*  --  147*   Basic Metabolic Panel: Recent Labs  Lab 09/09/17 1927 09/10/17 0832 09/11/17 0724  NA 125* 127* 125*  K 3.0* 3.7 3.3*  CL 93* 99* 99*  CO2 14* 20* 18*  GLUCOSE 103* 97 93  BUN <5* <5* <5*  CREATININE 0.65 0.62 0.50*  CALCIUM 8.5* 7.4* 7.6*  MG  --  1.8  --    GFR: Estimated Creatinine Clearance: 72.6 mL/min (A) (by C-G formula based on SCr of 0.5 mg/dL (L)).  Liver Function Tests: Recent Labs  Lab 09/09/17 1927 09/10/17 0832  AST 68* 56*  ALT 53 45  ALKPHOS 188* 149*  BILITOT 1.1 3.0*  PROT 7.4 6.1*  ALBUMIN 3.0* 2.4*    Coagulation Profile: Recent Labs  Lab 09/10/17 0832  INR 1.26    CBG: Recent Labs    Lab 09/10/17 2119 09/11/17 0031 09/11/17 0437 09/11/17 0718 09/11/17 1117  GLUCAP 184* 158* 84 103* 106*    Recent Results (from the past 240 hour(s))  Blood culture (routine x 2)     Status: None (Preliminary result)   Collection Time: 09/09/17  8:27 PM  Result Value Ref Range Status   Specimen Description BLOOD RIGHT ANTECUBITAL  Final   Special Requests   Final    BOTTLES DRAWN AEROBIC AND ANAEROBIC Blood Culture adequate volume   Culture NO GROWTH < 12 HOURS  Final   Report Status PENDING  Incomplete  Blood culture (routine x 2)     Status: None (Preliminary result)   Collection Time: 09/09/17  8:50 PM  Result Value Ref Range Status   Specimen Description BLOOD BLOOD LEFT FOREARM  Final   Special Requests   Final    BOTTLES DRAWN AEROBIC AND ANAEROBIC Blood Culture adequate volume   Culture NO GROWTH < 12 HOURS  Final   Report Status PENDING  Incomplete  Urine culture     Status: Abnormal (Preliminary result)   Collection Time: 09/10/17  2:01 AM  Result Value Ref Range Status   Specimen Description URINE, CLEAN CATCH  Final   Special Requests NONE  Final   Culture (A)  Final    >=100,000 COLONIES/mL STAPHYLOCOCCUS SPECIES (COAGULASE NEGATIVE) SUSCEPTIBILITIES TO FOLLOW    Report Status PENDING  Incomplete  MRSA PCR Screening     Status: None   Collection Time: 09/10/17  2:26 AM  Result Value Ref Range Status   MRSA by PCR NEGATIVE NEGATIVE Final    Comment:        The GeneXpert MRSA Assay (FDA approved for NASAL specimens only), is one component of a comprehensive MRSA colonization surveillance program. It is not intended to diagnose MRSA infection nor to guide or monitor treatment for MRSA infections.      Scheduled Meds: . feeding supplement  1 Container Oral TID BM  . folic acid  1 mg Oral Daily  . LORazepam  0-4 mg Intravenous Q6H   Followed by  . [START ON 09/12/2017] LORazepam  0-4 mg Intravenous Q12H  . multivitamin with minerals  1 tablet Oral  Daily  . nicotine  14 mg Transdermal Daily  . [START ON 09/13/2017] pantoprazole  40 mg Intravenous Q12H  . thiamine  100 mg Oral Daily   Or  . thiamine  100 mg Intravenous Daily     LOS: 1 day   Cherene Altes, MD [facility] Office  [phone] Pager - Text Page per Shea Evans as per below:  On-Call/Text Page:      [email or web address]      password [email or web address]  If 7PM-7AM, please  contact night-coverage [email or web address] Password [email or web address] 09/11/2017, 1:47 PM

## 2017-09-12 ENCOUNTER — Encounter (HOSPITAL_COMMUNITY): Payer: Self-pay | Admitting: Gastroenterology

## 2017-09-12 DIAGNOSIS — J9601 Acute respiratory failure with hypoxia: Secondary | ICD-10-CM

## 2017-09-12 LAB — COMPREHENSIVE METABOLIC PANEL
ALT: 40 U/L (ref 17–63)
ANION GAP: 6 (ref 5–15)
AST: 51 U/L — ABNORMAL HIGH (ref 15–41)
Albumin: 2.4 g/dL — ABNORMAL LOW (ref 3.5–5.0)
Alkaline Phosphatase: 138 U/L — ABNORMAL HIGH (ref 38–126)
BUN: <5 mg/dL — ABNORMAL LOW (ref 6–20)
CHLORIDE: 103 mmol/L (ref 101–111)
CO2: 21 mmol/L — AB (ref 22–32)
CREATININE: 0.53 mg/dL — AB (ref 0.61–1.24)
Calcium: 8.1 mg/dL — ABNORMAL LOW (ref 8.9–10.3)
Glucose, Bld: 83 mg/dL (ref 65–99)
POTASSIUM: 3.8 mmol/L (ref 3.5–5.1)
Sodium: 130 mmol/L — ABNORMAL LOW (ref 135–145)
Total Bilirubin: 1.5 mg/dL — ABNORMAL HIGH (ref 0.3–1.2)
Total Protein: 6.2 g/dL — ABNORMAL LOW (ref 6.5–8.1)

## 2017-09-12 LAB — TYPE AND SCREEN
ABO/RH(D): O POS
ANTIBODY SCREEN: NEGATIVE
UNIT DIVISION: 0
Unit division: 0
Unit division: 0
Unit division: 0
Unit division: 0

## 2017-09-12 LAB — BPAM RBC
BLOOD PRODUCT EXPIRATION DATE: 201901312359
BLOOD PRODUCT EXPIRATION DATE: 201902012359
Blood Product Expiration Date: 201901312359
Blood Product Expiration Date: 201901312359
Blood Product Expiration Date: 201902012359
ISSUE DATE / TIME: 201901022232
ISSUE DATE / TIME: 201901030237
ISSUE DATE / TIME: 201901030237
ISSUE DATE / TIME: 201901022350
ISSUE DATE / TIME: 201901040024
UNIT TYPE AND RH: 5100
Unit Type and Rh: 5100
Unit Type and Rh: 5100
Unit Type and Rh: 5100
Unit Type and Rh: 5100

## 2017-09-12 LAB — CBC
HEMATOCRIT: 27 % — AB (ref 39.0–52.0)
HEMOGLOBIN: 8.4 g/dL — AB (ref 13.0–17.0)
MCH: 23.7 pg — ABNORMAL LOW (ref 26.0–34.0)
MCHC: 31.1 g/dL (ref 30.0–36.0)
MCV: 76.3 fL — AB (ref 78.0–100.0)
Platelets: 165 10*3/uL (ref 150–400)
RBC: 3.54 MIL/uL — AB (ref 4.22–5.81)
RDW: 25.2 % — AB (ref 11.5–15.5)
WBC: 13.2 10*3/uL — AB (ref 4.0–10.5)

## 2017-09-12 LAB — GLUCOSE, CAPILLARY
GLUCOSE-CAPILLARY: 132 mg/dL — AB (ref 65–99)
GLUCOSE-CAPILLARY: 113 mg/dL — AB (ref 65–99)
GLUCOSE-CAPILLARY: 106 mg/dL — AB (ref 65–99)
Glucose-Capillary: 102 mg/dL — ABNORMAL HIGH (ref 65–99)

## 2017-09-12 LAB — URINE CULTURE: Culture: >=100000 — AB

## 2017-09-12 LAB — MAGNESIUM: MAGNESIUM: 1.6 mg/dL — AB (ref 1.7–2.4)

## 2017-09-12 MED ORDER — AZITHROMYCIN 250 MG PO TABS
250.0000 mg | ORAL_TABLET | Freq: Every day | ORAL | Status: AC
  Administered 2017-09-12 – 2017-09-14 (×3): 250 mg via ORAL
  Filled 2017-09-12 (×3): qty 1

## 2017-09-12 MED ORDER — SODIUM CHLORIDE 0.9 % IV BOLUS (SEPSIS)
1000.0000 mL | Freq: Once | INTRAVENOUS | Status: AC
  Administered 2017-09-12: 1000 mL via INTRAVENOUS

## 2017-09-12 MED ORDER — DOCUSATE SODIUM 100 MG PO CAPS
100.0000 mg | ORAL_CAPSULE | Freq: Two times a day (BID) | ORAL | Status: DC
  Administered 2017-09-14: 100 mg via ORAL
  Filled 2017-09-12: qty 1

## 2017-09-12 MED ORDER — MAGNESIUM SULFATE 2 GM/50ML IV SOLN
2.0000 g | Freq: Once | INTRAVENOUS | Status: AC
  Administered 2017-09-12: 2 g via INTRAVENOUS
  Filled 2017-09-12: qty 50

## 2017-09-12 MED ORDER — PEG 3350-KCL-NA BICARB-NACL 420 G PO SOLR
4000.0000 mL | Freq: Once | ORAL | Status: AC
  Administered 2017-09-12: 4000 mL via ORAL
  Filled 2017-09-12 (×2): qty 4000

## 2017-09-12 MED ORDER — FERROUS SULFATE 325 (65 FE) MG PO TABS
325.0000 mg | ORAL_TABLET | Freq: Three times a day (TID) | ORAL | Status: DC
  Administered 2017-09-12 – 2017-09-14 (×6): 325 mg via ORAL
  Filled 2017-09-12 (×6): qty 1

## 2017-09-12 MED ORDER — SODIUM CHLORIDE 0.9 % IV SOLN: INTRAVENOUS | Status: DC

## 2017-09-12 MED ORDER — PANTOPRAZOLE SODIUM 40 MG PO TBEC: 40.0000 mg | DELAYED_RELEASE_TABLET | Freq: Two times a day (BID) | ORAL | Status: DC

## 2017-09-12 NOTE — Plan of Care (Signed)
Iron Deficiency Anemia. Plan Colonoscopy tomorrow. Pt aware and agreeable. Consent signed.

## 2017-09-12 NOTE — Progress Notes (Signed)
Patient called nurse into room. Patient had a BM in bed and capsule from capsule study expelled from rectum.

## 2017-09-12 NOTE — Progress Notes (Signed)
PROGRESS NOTE    James [name]  QAS:[id number] DOB: [birth year] DOA: 09/09/2017 PCP: Denita Lung, MD      Brief Narrative:  James [name] is a 62 year old man with history of alcohol use, PUD, esophagitis and chronic anemia who presents with weakness without any prior indications of GI blood loss (melena, hematochezia), found to have Hgb 2.1.         Assessment & Plan:  Active Problems:   Anemia   Acute respiratory failure with hypoxia (HCC)   Presumed chronic blood loss anemia Now s/p 5U PRBCs.  Capsule study completed yesterday, pending. -Start iron -Consult GI, appreciate cares -Stop octreotide -Switch PPI to oral -PT eval for dispo  Hyponatremia Improved.  Normovolemic.  Hypokalemia Mild. Supplemented.  Community acquired pneumonia -Will finish course of treatment -Switch to oral azithro -Continue ceftriaxone -Will need outpatient f/u for ILD  Alcohol, tobacco abuse -Thiamine, MVI -CIWA with lorazepam PRN -Nicotine patch      DVT prophylaxis: SCDs Code Status: FULL Family Communication: None present Disposition Plan: PT eval, then likely home tomorrow pending capsule study and further GI recommendations.   Consultants:   GI  Procedures:   Capsule endoscopy  Antimicrobials:   Ceftriaxone 1/2 >>  Azithromycin 1/2 >>    Subjective: Feels fine.Mild cough.  No new fever, confusion, weakness.  BP soft today.  No dizziness, malaise.  No diarrhea, melena.  Had some red blood with expulsion of capsule.  Objective: Vitals:   09/11/17 2046 09/12/17 0015 09/12/17 0435 09/12/17 0828  BP: 106/74 106/69 (!) 89/75 98/60  Pulse: 97 99 97   Resp: 20     Temp: 98.7 F (37.1 C) 98.1 F (36.7 C) 98.1 F (36.7 C) 98 F (36.7 C)  TempSrc: Oral Oral Oral Axillary  SpO2: 97% 95% 96% 97%  Weight:   50.6 kg (111 lb 8 oz)   Height:        Intake/Output Summary (Last 24 hours) at 09/12/2017 0923 Last data filed at 09/12/2017 7989 Gross per 24 hour    Intake 1089.08 ml  Output 175 ml  Net 914.08 ml   Filed Weights   09/10/17 0230 09/11/17 0526 09/12/17 0435  Weight: 49.9 kg (110 lb 0.2 oz) 51.6 kg (113 lb 12.1 oz) 50.6 kg (111 lb 8 oz)    Examination: General appearance: THin  adult male, alert and in no acute distress.  Just ate breakfast. HEENT: Sclera muddy, conjunctiva pink, lids and lashes normal. No nasal deformity, discharge, epistaxis.  Lips moist.   Skin: Warm and dry.   No suspicious rashes or lesions. Cardiac: RRR, nl S1-S2, no murmurs appreciated.    Respiratory: Normal respiratory rate and rhythm.  CTAB without rales or wheezes. Abdomen: Abdomen soft.  No TTP. No ascites, distension, hepatosplenomegaly.   MSK: No deformities or effusions. Neuro: Awake and alert.  EOMI, moves all extremities. Speech fluent.    Psych: Sensorium intact and responding to questions, attention normal. Affect normal.  Judgment and insight appear normal.    Data Reviewed: I have personally reviewed following labs and imaging studies:  CBC: Recent Labs  Lab 09/09/17 2050 09/10/17 0832 09/10/17 2158 09/11/17 0724 09/12/17 0350  WBC 10.9* 7.8  --  8.6 13.2*  NEUTROABS 9.0*  --   --   --   --   HGB 2.1* 7.1*  7.1* 6.7* 8.0* 8.4*  HCT 8.8* 22.4*  22.2* 21.7* 25.3* 27.0*  MCV 59.1* 74.9*  --  76.2* 76.3*  PLT  165 114*  --  139* 557   Basic Metabolic Panel: Recent Labs  Lab 09/09/17 1927 09/10/17 0832 09/11/17 0724 09/12/17 0350  NA 125* 127* 125* 130*  K 3.0* 3.7 3.3* 3.8  CL 93* 99* 99* 103  CO2 14* 20* 18* 21*  GLUCOSE 103* 97 93 83  BUN <5* <5* <5* <5*  CREATININE 0.65 0.62 0.50* 0.53*  CALCIUM 8.5* 7.4* 7.6* 8.1*  MG  --  1.8  --  1.6*   GFR: Estimated Creatinine Clearance: 71.2 mL/min (A) (by C-G formula based on SCr of 0.53 mg/dL (L)). Liver Function Tests: Recent Labs  Lab 09/09/17 1927 09/10/17 0832 09/12/17 0350  AST 68* 56* 51*  ALT 53 45 40  ALKPHOS 188* 149* 138*  BILITOT 1.1 3.0* 1.5*  PROT 7.4  6.1* 6.2*  ALBUMIN 3.0* 2.4* 2.4*   No results for input(s): LIPASE, AMYLASE in the last 168 hours. No results for input(s): AMMONIA in the last 168 hours. Coagulation Profile: Recent Labs  Lab 09/10/17 0832  INR 1.26   Cardiac Enzymes: No results for input(s): CKTOTAL, CKMB, CKMBINDEX, TROPONINI in the last 168 hours. BNP (last 3 results) No results for input(s): PROBNP in the last 8760 hours. HbA1C: No results for input(s): HGBA1C in the last 72 hours. CBG: Recent Labs  Lab 09/11/17 0718 09/11/17 1117 09/11/17 1616 09/11/17 2150 09/12/17 0741  GLUCAP 103* 106* 158* 143* 102*   Lipid Profile: No results for input(s): CHOL, HDL, LDLCALC, TRIG, CHOLHDL, LDLDIRECT in the last 72 hours. Thyroid Function Tests: No results for input(s): TSH, T4TOTAL, FREET4, T3FREE, THYROIDAB in the last 72 hours. Anemia Panel: No results for input(s): VITAMINB12, FOLATE, FERRITIN, TIBC, IRON, RETICCTPCT in the last 72 hours. Urine analysis:    Component Value Date/Time   COLORURINE YELLOW 09/10/2017 0201   APPEARANCEUR CLEAR 09/10/2017 0201   LABSPEC 1.045 (H) 09/10/2017 0201   PHURINE 6.0 09/10/2017 0201   GLUCOSEU NEGATIVE 09/10/2017 0201   HGBUR NEGATIVE 09/10/2017 0201   BILIRUBINUR NEGATIVE 09/10/2017 0201   KETONESUR 5 (A) 09/10/2017 0201   PROTEINUR NEGATIVE 09/10/2017 0201   NITRITE NEGATIVE 09/10/2017 0201   LEUKOCYTESUR NEGATIVE 09/10/2017 0201   Sepsis Labs: @LABRCNTIP (procalcitonin:4,lacticacidven:4)  ) Recent Results (from the past 240 hour(s))  Blood culture (routine x 2)     Status: None (Preliminary result)   Collection Time: 09/09/17  8:27 PM  Result Value Ref Range Status   Specimen Description BLOOD RIGHT ANTECUBITAL  Final   Special Requests   Final    BOTTLES DRAWN AEROBIC AND ANAEROBIC Blood Culture adequate volume   Culture NO GROWTH 2 DAYS  Final   Report Status PENDING  Incomplete  Blood culture (routine x 2)     Status: None (Preliminary result)    Collection Time: 09/09/17  8:50 PM  Result Value Ref Range Status   Specimen Description BLOOD BLOOD LEFT FOREARM  Final   Special Requests   Final    BOTTLES DRAWN AEROBIC AND ANAEROBIC Blood Culture adequate volume   Culture NO GROWTH 2 DAYS  Final   Report Status PENDING  Incomplete  Urine culture     Status: Abnormal   Collection Time: 09/10/17  2:01 AM  Result Value Ref Range Status   Specimen Description URINE, CLEAN CATCH  Final   Special Requests NONE  Final   Culture >=100,000 COLONIES/mL STAPHYLOCOCCUS EPIDERMIDIS (A)  Final   Report Status 09/12/2017 FINAL  Final   Organism ID, Bacteria STAPHYLOCOCCUS EPIDERMIDIS (A)  Final  Susceptibility   Staphylococcus epidermidis - MIC*    CIPROFLOXACIN <=0.5 SENSITIVE Sensitive     GENTAMICIN <=0.5 SENSITIVE Sensitive     NITROFURANTOIN <=16 SENSITIVE Sensitive     OXACILLIN <=0.25 SENSITIVE Sensitive     TETRACYCLINE 2 SENSITIVE Sensitive     VANCOMYCIN <=0.5 SENSITIVE Sensitive     TRIMETH/SULFA 20 SENSITIVE Sensitive     CLINDAMYCIN <=0.25 SENSITIVE Sensitive     RIFAMPIN <=0.5 SENSITIVE Sensitive     Inducible Clindamycin NEGATIVE Sensitive     * >=100,000 COLONIES/mL STAPHYLOCOCCUS EPIDERMIDIS  MRSA PCR Screening     Status: None   Collection Time: 09/10/17  2:26 AM  Result Value Ref Range Status   MRSA by PCR NEGATIVE NEGATIVE Final    Comment:        The GeneXpert MRSA Assay (FDA approved for NASAL specimens only), is one component of a comprehensive MRSA colonization surveillance program. It is not intended to diagnose MRSA infection nor to guide or monitor treatment for MRSA infections.          Radiology Studies: Dg Chest Port 1 View  Result Date: 09/11/2017 CLINICAL DATA:  Cough. EXAM: PORTABLE CHEST 1 VIEW COMPARISON:  CT scan and radiograph of September 09, 2017. FINDINGS: Stable cardiomediastinal silhouette. No pneumothorax or pleural effusion is noted. Mildly increased bilateral perihilar and  infrahilar opacities are noted suggesting worsening edema or inflammation. Bony thorax is unremarkable. IMPRESSION: Mildly increased bilateral perihilar and infrahilar opacities are noted suggesting worsening inflammation or edema. Electronically Signed   By: Marijo Conception, M.D.   On: 09/11/2017 09:08        Scheduled Meds: . azithromycin  250 mg Oral Daily  . docusate sodium  100 mg Oral BID  . feeding supplement  1 Container Oral TID BM  . ferrous sulfate  325 mg Oral TID WC  . folic acid  1 mg Oral Daily  . LORazepam  0-4 mg Intravenous Q6H   Followed by  . LORazepam  0-4 mg Intravenous Q12H  . multivitamin with minerals  1 tablet Oral Daily  . nicotine  14 mg Transdermal Daily  . pantoprazole  40 mg Oral BID AC  . thiamine  100 mg Oral Daily   Continuous Infusions: . cefTRIAXone (ROCEPHIN)  IV Stopped (09/11/17 2302)     LOS: 2 days    Time spent: 45 minutes    Edwin Dada, MD [facility] 09/12/2017, 9:23 AM     Pager [phone] --- please page though AMION:  [email or web address] Password [email or web address] If 7PM-7AM, please contact night-coverage

## 2017-09-12 NOTE — Progress Notes (Signed)
Capsule study collected.  Video to be downloaded today.

## 2017-09-13 ENCOUNTER — Encounter (HOSPITAL_COMMUNITY)
Admission: EM | Disposition: A | Discharge status: Home / Self Care | Payer: Self-pay | Source: Home / Self Care | Attending: Family Medicine

## 2017-09-13 ENCOUNTER — Encounter (HOSPITAL_COMMUNITY): Payer: Self-pay

## 2017-09-13 HISTORY — PX: ENTEROSCOPY: SHX5533

## 2017-09-13 HISTORY — PX: COLONOSCOPY: SHX5424

## 2017-09-13 LAB — BASIC METABOLIC PANEL
ANION GAP: 8 (ref 5–15)
CHLORIDE: 99 mmol/L — AB (ref 101–111)
CO2: 19 mmol/L — AB (ref 22–32)
Calcium: 7.6 mg/dL — ABNORMAL LOW (ref 8.9–10.3)
Creatinine, Ser: 0.41 mg/dL — ABNORMAL LOW (ref 0.61–1.24)
GFR calc Af Amer: >60 mL/min (ref >60)
GFR calc non Af Amer: >60 mL/min (ref >60)
Glucose, Bld: 97 mg/dL (ref 65–99)
POTASSIUM: 3.2 mmol/L — AB (ref 3.5–5.1)
Sodium: 126 mmol/L — ABNORMAL LOW (ref 135–145)

## 2017-09-13 LAB — GLUCOSE, CAPILLARY
GLUCOSE-CAPILLARY: 93 mg/dL (ref 65–99)
GLUCOSE-CAPILLARY: 98 mg/dL (ref 65–99)
GLUCOSE-CAPILLARY: 99 mg/dL (ref 65–99)
GLUCOSE-CAPILLARY: 90 mg/dL (ref 65–99)

## 2017-09-13 LAB — CBC
HEMATOCRIT: 27.9 % — AB (ref 39.0–52.0)
Hemoglobin: 8.9 g/dL — ABNORMAL LOW (ref 13.0–17.0)
MCH: 25 pg — AB (ref 26.0–34.0)
MCHC: 31.9 g/dL (ref 30.0–36.0)
MCV: 78.4 fL (ref 78.0–100.0)
Platelets: 190 10*3/uL (ref 150–400)
RBC: 3.56 MIL/uL — AB (ref 4.22–5.81)
RDW: 25.9 % — ABNORMAL HIGH (ref 11.5–15.5)
WBC: 10.2 10*3/uL (ref 4.0–10.5)

## 2017-09-13 LAB — SODIUM, URINE, RANDOM: Sodium, Ur: 127 mmol/L

## 2017-09-13 LAB — OSMOLALITY: Osmolality: 273 mOsm/kg — ABNORMAL LOW (ref 275–295)

## 2017-09-13 LAB — OSMOLALITY, URINE: OSMOLALITY UR: 378 mosm/kg (ref 300–900)

## 2017-09-13 SURGERY — COLONOSCOPY
Anesthesia: Moderate Sedation

## 2017-09-13 MED ORDER — DIPHENHYDRAMINE HCL 50 MG/ML IJ SOLN
INTRAMUSCULAR | Status: DC | PRN
  Administered 2017-09-13: 25 mg via INTRAVENOUS

## 2017-09-13 MED ORDER — FENTANYL CITRATE (PF) 100 MCG/2ML IJ SOLN
INTRAMUSCULAR | Status: AC
  Filled 2017-09-13: qty 2

## 2017-09-13 MED ORDER — FENTANYL CITRATE (PF) 100 MCG/2ML IJ SOLN
INTRAMUSCULAR | Status: DC | PRN
  Administered 2017-09-13 (×4): 25 ug via INTRAVENOUS

## 2017-09-13 MED ORDER — MIDAZOLAM HCL 10 MG/2ML IJ SOLN
INTRAMUSCULAR | Status: DC | PRN
  Administered 2017-09-13: 2 mg via INTRAVENOUS

## 2017-09-13 MED ORDER — POTASSIUM CHLORIDE CRYS ER 20 MEQ PO TBCR
40.0000 meq | EXTENDED_RELEASE_TABLET | Freq: Once | ORAL | Status: AC
  Administered 2017-09-13: 40 meq via ORAL
  Filled 2017-09-13: qty 2

## 2017-09-13 MED ORDER — DIPHENHYDRAMINE HCL 50 MG/ML IJ SOLN
INTRAMUSCULAR | Status: AC
  Filled 2017-09-13: qty 1

## 2017-09-13 MED ORDER — MIDAZOLAM HCL 5 MG/5ML IJ SOLN
INTRAMUSCULAR | Status: DC | PRN
  Administered 2017-09-13: 1 mg via INTRAVENOUS
  Administered 2017-09-13: 2 mg via INTRAVENOUS
  Administered 2017-09-13: 1 mg via INTRAVENOUS

## 2017-09-13 MED ORDER — MIDAZOLAM HCL 5 MG/ML IJ SOLN
INTRAMUSCULAR | Status: AC
  Filled 2017-09-13: qty 2

## 2017-09-13 MED ORDER — POTASSIUM CHLORIDE CRYS ER 20 MEQ PO TBCR
20.0000 meq | EXTENDED_RELEASE_TABLET | Freq: Every day | ORAL | Status: DC
  Administered 2017-09-13 – 2017-09-14 (×2): 20 meq via ORAL
  Filled 2017-09-13 (×2): qty 1

## 2017-09-13 NOTE — Op Note (Signed)
[facility] Patient Name: James [name] Procedure Date : 09/13/2017 MRN: [id number] Attending MD: Carol Ada , MD Date of Birth: Dec 23, 1955 CSN: [id number] Age: 62 Admit Type: Inpatient Procedure:                Small bowel enteroscopy Indications:              Arteriovenous malformation in the stomach,                            Arteriovenous malformation in the small intestine Providers:                Carol Ada, MD, Carolynn Comment RN, RN,                            William Dalton, Technician Referring MD:              Medicines:                See the colonoscopy report. Complications:            No immediate complications. Estimated Blood Loss:     Estimated blood loss: none. Procedure:                Pre-Anesthesia Assessment:                           - Prior to the procedure, a History and Physical                            was performed, and patient medications and                            allergies were reviewed. The patient's tolerance of                            previous anesthesia was also reviewed. The risks                            and benefits of the procedure and the sedation                            options and risks were discussed with the patient.                            All questions were answered, and informed consent                            was obtained. Prior Anticoagulants: The patient has                            taken no previous anticoagulant or antiplatelet                            agents. ASA Grade Assessment: III - A patient with  severe systemic disease. After reviewing the risks                            and benefits, the patient was deemed in                            satisfactory condition to undergo the procedure.                           - Sedation was administered by an endoscopy nurse.                            The sedation level attained was moderate.  After obtaining informed consent, the endoscope was                            passed under direct vision. Throughout the                            procedure, the patient's blood pressure, pulse, and                            oxygen saturations were monitored continuously. The                            EC-[id number] ([id number]) scope was introduced through                            the mouth and advanced to the proximal jejunum. The                            small bowel enteroscopy was somewhat difficult due                            to the patient's discomfort during the procedure.                            Successful completion of the procedure was aided by                            increasing the dose of sedation medication. Scope In: Scope Out: Findings:      The esophagus was normal.      Two 2 mm no bleeding angiodysplastic lesions were found in the gastric       body. Coagulation for tissue destruction using monopolar probe was       successful. Estimated blood loss: none.      The examined duodenum was normal.      Two angiodysplastic lesions with no bleeding were found in the proximal       jejunum. Coagulation for tissue destruction using monopolar probe was       successful. Impression:               - Normal esophagus.                           -  Two non-bleeding angiodysplastic lesions in the                            stomach. Treated with a monopolar probe.                           - Normal examined duodenum.                           - Two non-bleeding angiodysplastic lesions in the                            jejunum. Treated with a monopolar probe.                           - No specimens collected. Recommendation:           - Return patient to hospital ward for ongoing care.                           - Resume regular diet.                           - Oral iron supplemenation.                           - Possibly follow with Hematology for routine or                             semi-routine iron infusions.                           - No further GI evaluation at this time. Signing                            off. Procedure Code(s):        --- Professional ---                           [phone], Small intestinal endoscopy, enteroscopy                            beyond second portion of duodenum, not including                            ileum; with ablation of tumor(s), polyp(s), or                            other lesion(s) not amenable to removal by hot                            biopsy forceps, bipolar cautery or snare technique Diagnosis Code(s):        --- Professional ---                           K31.819, Angiodysplasia of stomach and duodenum  without bleeding                           K55.20, Angiodysplasia of colon without hemorrhage                           Q27.33, Arteriovenous malformation of digestive                            system vessel CPT copyright 2016 [employer]. All rights reserved. The codes documented in this report are preliminary and upon coder review may  be revised to meet current compliance requirements. Carol Ada, MD Carol Ada, MD 09/13/2017 10:37:31 AM This report has been signed electronically. Number of Addenda: 0

## 2017-09-13 NOTE — Op Note (Signed)
[facility] Patient Name: James [name] Procedure Date : 09/13/2017 MRN: [id number] Attending MD: Carol Ada , MD Date of Birth: March 14, 1956 CSN: [id number] Age: 62 Admit Type: Inpatient Procedure:                Colonoscopy Indications:              High risk colon cancer surveillance: Personal                            history of colonic polyps Providers:                Carol Ada, MD, Carolynn Comment RN, RN,                            William Dalton, Technician Referring MD:              Medicines:                Midazolam 6 mg IV, Fentanyl 100 micrograms IV,                            Diphenhydramine 25 mg IV Complications:            No immediate complications. Estimated Blood Loss:     Estimated blood loss: none. Procedure:                Pre-Anesthesia Assessment:                           - Prior to the procedure, a History and Physical                            was performed, and patient medications and                            allergies were reviewed. The patient's tolerance of                            previous anesthesia was also reviewed. The risks                            and benefits of the procedure and the sedation                            options and risks were discussed with the patient.                            All questions were answered, and informed consent                            was obtained. Prior Anticoagulants: The patient has                            taken no previous anticoagulant or antiplatelet  agents. ASA Grade Assessment: III - A patient with                            severe systemic disease. After reviewing the risks                            and benefits, the patient was deemed in                            satisfactory condition to undergo the procedure.                           - Sedation was administered by an endoscopy nurse.                            The sedation level attained  was moderate.                           After obtaining informed consent, the colonoscope                            was passed under direct vision. Throughout the                            procedure, the patient's blood pressure, pulse, and                            oxygen saturations were monitored continuously. The                            EC-[id number] ([id number]) scope was introduced through                            the anus and advanced to the the cecum, identified                            by appendiceal orifice and ileocecal valve. The                            colonoscopy was somewhat difficult due to                            significant looping. Successful completion of the                            procedure was aided by applying abdominal pressure.                            The patient tolerated the procedure well. The                            quality of the bowel preparation was excellent. The  ileocecal valve, appendiceal orifice, and rectum                            were photographed. Scope In: 10:12:24 AM Scope Out: 10:24:43 AM Scope Withdrawal Time: 0 hours 6 minutes 25 seconds  Total Procedure Duration: 0 hours 12 minutes 19 seconds  Findings:      The entire examined colon appeared normal. Impression:               - The entire examined colon is normal.                           - No specimens collected. Moderate Sedation:      Moderate (conscious) sedation was administered by the endoscopy nurse       and supervised by the endoscopist. The following parameters were       monitored: oxygen saturation, heart rate, blood pressure, and response       to care. Recommendation:           - Return patient to hospital ward for ongoing care.                           - Resume regular diet.                           - Continue present medications.                           - Repeat colonoscopy in 7 years for surveillance. Procedure Code(s):         --- Professional ---                           [phone], Colonoscopy, flexible; diagnostic, including                            collection of specimen(s) by brushing or washing,                            when performed (separate procedure) Diagnosis Code(s):        --- Professional ---                           Z86.010, Personal history of colonic polyps CPT copyright 2016 [employer]. All rights reserved. The codes documented in this report are preliminary and upon coder review may  be revised to meet current compliance requirements. Carol Ada, MD Carol Ada, MD 09/13/2017 10:32:45 AM This report has been signed electronically. Number of Addenda: 0

## 2017-09-13 NOTE — Progress Notes (Signed)
PROGRESS NOTE    James [name]  GQQ:[id number] DOB: 1956-01-19 DOA: 09/09/2017 PCP: Denita Lung, MD      Brief Narrative:  James [name] is a 62 year old man with history of alcohol use, PUD, esophagitis and chronic blood loss anemia who presents with weakness without any prior indications of GI blood loss (melena, hematochezia), found to have Hgb 2.1.         Assessment & Plan:  Active Problems:   Anemia   Acute respiratory failure with hypoxia (HCC)   Presumed chronic blood loss anemia Previous PUD, known chronic anemia, now markedly worse.  Iron studies back in June showed iron def.  Now s/p 5U PRBCs.   Capsule study pending, colonoscopy today. -Continue iron -Consult GI, appreciate cares -Continue PPI -PT eval for dispo   Hyponatremia Worse.  Appears euvolmic. -Check free water clearance, urine Na  Hypokalemia Mild.  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  Colonoscopy today.  If that and capsule are negative and GI intend no more procedures, likely home in 1-2 days after PT eval.   Consultants:   GI  Procedures:   Capsule endoscopy  Antimicrobials:   Ceftriaxone 1/2 >>  Azithromycin 1/2 >>    Subjective: Feels fine.  Hungry.  No fever, confusion, weakness, dizziness, malaise, diarrhea, melena, hematochezia.  Objective: Vitals:   09/12/17 1200 09/12/17 1625 09/12/17 1941 09/13/17 0502  BP: 114/69 110/68 110/71 118/75  Pulse: 85 85 84 83  Resp:  (!) 25 (!) 22 19  Temp:  98.4 F (36.9 C) 99.4 F (37.4 C) (!) 97.5 F (36.4 C)  TempSrc:  Oral Oral Oral  SpO2:  98% 94% 96%  Weight:    51.1 kg (112 lb 11.2 oz)    Height:        Intake/Output Summary (Last 24 hours) at 09/13/2017 0926 Last data filed at 09/13/2017 0842 Gross per 24 hour  Intake 450.42 ml  Output 700 ml  Net -249.58 ml   Filed Weights   09/11/17 0526 09/12/17 0435 09/13/17 0502  Weight: 51.6 kg (113 lb 12.1 oz) 50.6 kg (111 lb 8 oz) 51.1 kg (112 lb 11.2 oz)    Examination: General appearance: THin  adult male, alert and in no acute distress.    HEENT: Sclera muddy, conjunctiva pink, lids and lashes normal. No nasal deformity, discharge, epistaxis.  Lips moist.   Skin: Warm and dry.   No suspicious rashes or lesions. Cardiac: RRR, nl S1-S2, no murmurs appreciated.    Respiratory: Normal respiratory rate and rhythm.  CTAB without rales or wheezes. Abdomen: Abdomen soft.  No TTP. No ascites, distension, hepatosplenomegaly.   MSK: No deformities or effusions. Neuro: Awake and alert.  EOMI, moves all extremities. Speech fluent.    Psych: Sensorium intact and responding to questions, attention normal. Affect normal.  Judgment and insight appear normal.    Data Reviewed: I have personally reviewed following labs and imaging studies:  CBC: Recent Labs  Lab 09/09/17 2050 09/10/17 0832 09/10/17 2158 09/11/17 0724 09/12/17 0350 09/13/17 0344  WBC 10.9* 7.8  --  8.6 13.2* 10.2  NEUTROABS 9.0*  --   --   --   --   --  HGB 2.1* 7.1*  7.1* 6.7* 8.0* 8.4* 8.9*  HCT 8.8* 22.4*  22.2* 21.7* 25.3* 27.0* 27.9*  MCV 59.1* 74.9*  --  76.2* 76.3* 78.4  PLT 165 114*  --  139* 165 144   Basic Metabolic Panel: Recent Labs  Lab 09/09/17 1927 09/10/17 0832 09/11/17 0724 09/12/17 0350 09/13/17 0344  NA 125* 127* 125* 130* 126*  K 3.0* 3.7 3.3* 3.8 3.2*  CL 93* 99* 99* 103 99*  CO2 14* 20* 18* 21* 19*  GLUCOSE 103* 97 93 83 97  BUN <5* <5* <5* <5* <5*  CREATININE 0.65 0.62 0.50* 0.53* 0.41*  CALCIUM 8.5* 7.4* 7.6* 8.1* 7.6*  MG  --  1.8  --  1.6*  --    GFR: Estimated Creatinine Clearance: 71.9 mL/min (A) (by C-G formula  based on SCr of 0.41 mg/dL (L)). Liver Function Tests: Recent Labs  Lab 09/09/17 1927 09/10/17 0832 09/12/17 0350  AST 68* 56* 51*  ALT 53 45 40  ALKPHOS 188* 149* 138*  BILITOT 1.1 3.0* 1.5*  PROT 7.4 6.1* 6.2*  ALBUMIN 3.0* 2.4* 2.4*   No results for input(s): LIPASE, AMYLASE in the last 168 hours. No results for input(s): AMMONIA in the last 168 hours. Coagulation Profile: Recent Labs  Lab 09/10/17 0832  INR 1.26   Cardiac Enzymes: No results for input(s): CKTOTAL, CKMB, CKMBINDEX, TROPONINI in the last 168 hours. BNP (last 3 results) No results for input(s): PROBNP in the last 8760 hours. HbA1C: No results for input(s): HGBA1C in the last 72 hours. CBG: Recent Labs  Lab 09/12/17 0741 09/12/17 1119 09/12/17 1623 09/12/17 2117 09/13/17 0745  GLUCAP 102* 132* 113* 106* 93   Lipid Profile: No results for input(s): CHOL, HDL, LDLCALC, TRIG, CHOLHDL, LDLDIRECT in the last 72 hours. Thyroid Function Tests: No results for input(s): TSH, T4TOTAL, FREET4, T3FREE, THYROIDAB in the last 72 hours. Anemia Panel: No results for input(s): VITAMINB12, FOLATE, FERRITIN, TIBC, IRON, RETICCTPCT in the last 72 hours. Urine analysis:    Component Value Date/Time   COLORURINE YELLOW 09/10/2017 0201   APPEARANCEUR CLEAR 09/10/2017 0201   LABSPEC 1.045 (H) 09/10/2017 0201   PHURINE 6.0 09/10/2017 0201   GLUCOSEU NEGATIVE 09/10/2017 0201   HGBUR NEGATIVE 09/10/2017 0201   BILIRUBINUR NEGATIVE 09/10/2017 0201   KETONESUR 5 (A) 09/10/2017 0201   PROTEINUR NEGATIVE 09/10/2017 0201   NITRITE NEGATIVE 09/10/2017 0201   LEUKOCYTESUR NEGATIVE 09/10/2017 0201   Sepsis Labs: @LABRCNTIP (procalcitonin:4,lacticacidven:4)  ) Recent Results (from the past 240 hour(s))  Blood culture (routine x 2)     Status: None (Preliminary result)   Collection Time: 09/09/17  8:27 PM  Result Value Ref Range Status   Specimen Description BLOOD RIGHT ANTECUBITAL  Final   Special Requests   Final     BOTTLES DRAWN AEROBIC AND ANAEROBIC Blood Culture adequate volume   Culture NO GROWTH 3 DAYS  Final   Report Status PENDING  Incomplete  Blood culture (routine x 2)     Status: None (Preliminary result)   Collection Time: 09/09/17  8:50 PM  Result Value Ref Range Status   Specimen Description BLOOD BLOOD LEFT FOREARM  Final   Special Requests   Final    BOTTLES DRAWN AEROBIC AND ANAEROBIC Blood Culture adequate volume   Culture NO GROWTH 3 DAYS  Final   Report Status PENDING  Incomplete  Urine culture     Status: Abnormal   Collection Time: 09/10/17  2:01 AM  Result Value Ref Range Status  Specimen Description URINE, CLEAN CATCH  Final   Special Requests NONE  Final   Culture >=100,000 COLONIES/mL STAPHYLOCOCCUS EPIDERMIDIS (A)  Final   Report Status 09/12/2017 FINAL  Final   Organism ID, Bacteria STAPHYLOCOCCUS EPIDERMIDIS (A)  Final      Susceptibility   Staphylococcus epidermidis - MIC*    CIPROFLOXACIN <=0.5 SENSITIVE Sensitive     GENTAMICIN <=0.5 SENSITIVE Sensitive     NITROFURANTOIN <=16 SENSITIVE Sensitive     OXACILLIN <=0.25 SENSITIVE Sensitive     TETRACYCLINE 2 SENSITIVE Sensitive     VANCOMYCIN <=0.5 SENSITIVE Sensitive     TRIMETH/SULFA 20 SENSITIVE Sensitive     CLINDAMYCIN <=0.25 SENSITIVE Sensitive     RIFAMPIN <=0.5 SENSITIVE Sensitive     Inducible Clindamycin NEGATIVE Sensitive     * >=100,000 COLONIES/mL STAPHYLOCOCCUS EPIDERMIDIS  MRSA PCR Screening     Status: None   Collection Time: 09/10/17  2:26 AM  Result Value Ref Range Status   MRSA by PCR NEGATIVE NEGATIVE Final    Comment:        The GeneXpert MRSA Assay (FDA approved for NASAL specimens only), is one component of a comprehensive MRSA colonization surveillance program. It is not intended to diagnose MRSA infection nor to guide or monitor treatment for MRSA infections.          Radiology Studies: No results found.      Scheduled Meds: . azithromycin  250 mg Oral Daily  .  docusate sodium  100 mg Oral BID  . feeding supplement  1 Container Oral TID BM  . ferrous sulfate  325 mg Oral TID WC  . folic acid  1 mg Oral Daily  . multivitamin with minerals  1 tablet Oral Daily  . nicotine  14 mg Transdermal Daily  . thiamine  100 mg Oral Daily   Continuous Infusions: . sodium chloride    . cefTRIAXone (ROCEPHIN)  IV Stopped (09/12/17 2308)     LOS: 3 days    Time spent: 25 minutes    Edwin Dada, MD [facility] 09/13/2017, 9:26 AM     Pager [phone] --- please page though AMION:  [email or web address] Password [email or web address] If 7PM-7AM, please contact night-coverage

## 2017-09-13 NOTE — Interval H&P Note (Signed)
History and Physical Interval Note:  09/13/2017 9:42 AM  James [name]  has presented today for surgery, with the diagnosis of Personal history of polyps  The various methods of treatment have been discussed with the patient and family. After consideration of risks, benefits and other options for treatment, the patient has consented to  Procedure(s): COLONOSCOPY (N/A) ENTEROSCOPY (N/A) as a surgical intervention .  The patient's history has been reviewed, patient examined, no change in status, stable for surgery.  I have reviewed the patient's chart and labs.  Questions were answered to the patient's satisfaction.     Donyell Carrell D

## 2017-09-13 NOTE — Progress Notes (Signed)
PT Cancellation Note  Patient Details Name: CYRUS RAMSBURG MRN: [id number] DOB: 1956-07-18   Cancelled Treatment:    Reason Eval/Treat Not Completed: Fatigue/lethargy limiting ability to participate -- pt lethargic in am following procedures, and in pm attempting to eat. Remains fatigued and defers mobility til next date.  Will return and make recommendations as able.    Kearney Hard [facility] 09/13/2017, 3:25 PM

## 2017-09-14 DIAGNOSIS — E876 Hypokalemia: Secondary | ICD-10-CM

## 2017-09-14 DIAGNOSIS — J189 Pneumonia, unspecified organism: Secondary | ICD-10-CM

## 2017-09-14 DIAGNOSIS — E871 Hypo-osmolality and hyponatremia: Secondary | ICD-10-CM

## 2017-09-14 LAB — BASIC METABOLIC PANEL
Anion gap: 6 (ref 5–15)
CHLORIDE: 108 mmol/L (ref 101–111)
CO2: 20 mmol/L — AB (ref 22–32)
Calcium: 8.1 mg/dL — ABNORMAL LOW (ref 8.9–10.3)
Creatinine, Ser: 0.58 mg/dL — ABNORMAL LOW (ref 0.61–1.24)
GFR calc Af Amer: >60 mL/min (ref >60)
GFR calc non Af Amer: >60 mL/min (ref >60)
GLUCOSE: 107 mg/dL — AB (ref 65–99)
POTASSIUM: 4.1 mmol/L (ref 3.5–5.1)
Sodium: 134 mmol/L — ABNORMAL LOW (ref 135–145)

## 2017-09-14 LAB — CBC
HCT: 29.7 % — ABNORMAL LOW (ref 39.0–52.0)
Hemoglobin: 8.9 g/dL — ABNORMAL LOW (ref 13.0–17.0)
MCH: 24 pg — AB (ref 26.0–34.0)
MCHC: 30 g/dL (ref 30.0–36.0)
MCV: 80.1 fL (ref 78.0–100.0)
Platelets: 262 10*3/uL (ref 150–400)
RBC: 3.71 MIL/uL — AB (ref 4.22–5.81)
RDW: 27.4 % — ABNORMAL HIGH (ref 11.5–15.5)
WBC: 10.4 10*3/uL (ref 4.0–10.5)

## 2017-09-14 LAB — CULTURE, BLOOD (ROUTINE X 2)
Culture: NO GROWTH
Culture: NO GROWTH
SPECIAL REQUESTS: ADEQUATE
Special Requests: ADEQUATE

## 2017-09-14 LAB — GLUCOSE, CAPILLARY: Glucose-Capillary: 109 mg/dL — ABNORMAL HIGH (ref 65–99)

## 2017-09-14 MED ORDER — FERROUS SULFATE 325 (65 FE) MG PO TABS: 325.0000 mg | ORAL_TABLET | Freq: Three times a day (TID) | ORAL | 3 refills | Status: DC

## 2017-09-14 MED ORDER — AZITHROMYCIN 250 MG PO TABS: 250.0000 mg | ORAL_TABLET | Freq: Every day | ORAL | 0 refills | Status: DC

## 2017-09-14 MED ORDER — DOCUSATE SODIUM 100 MG PO CAPS: 100.0000 mg | ORAL_CAPSULE | Freq: Two times a day (BID) | ORAL | 0 refills | Status: DC

## 2017-09-14 MED ORDER — CEFPODOXIME PROXETIL 200 MG PO TABS: 200.0000 mg | ORAL_TABLET | Freq: Two times a day (BID) | ORAL | 0 refills | Status: DC

## 2017-09-14 NOTE — Discharge Summary (Signed)
Physician Discharge Summary  James [name] [facility] HEN:[id number] DOB: [birth year] DOA: 09/09/2017  PCP: Denita Lung, MD  Admit date: 09/09/2017 Discharge date: 09/14/2017  Admitted From: Home  Disposition:  Home   Recommendations for Outpatient Follow-up:  1. Follow up with PCP in 1-2 weeks 2. Please obtain BMP/CBC in one week 3. Please assist patient with arranging outpatient iron infujsions if persistently anemic over next 6-12 months, or referral to Hematology for same   Home Health: None  Equipment/Devices: None  Discharge Condition: Good  CODE STATUS: FULL Diet recommendation: Regular  Brief/Interim Summary: [name] is a 62 year old man with history of alcohol use, PUD, esophagitis and chronic blood loss anemia who presents with weakness without any prior indications of GI blood loss (melena, hematochezia), found to have Hgb 2.1.          Presumed chronic blood loss anemia Previous PUD, known chronic anemia, presented with Hgb 2.1.  Given 5U PRBCs.   Got EGD with push enteroscopy, capsule study and colonoscopy, only finding small enteric AVMs. -Continue iron -Continue PPI -No GI follow up, needs trend of Hgb and iron studies for possible long term need for iron infusions  Community acquired pneumonia Diagnosed incidentally.  Treated with ceftriaxone and azithromcyin and discharged to finish course of treatment with cefpodoxime and azithromcyin  Smoking Smoking cessation was recommended.      Discharge Diagnoses:  Active Problems:   Anemia   Acute respiratory failure with hypoxia [facility])    Discharge Instructions  Discharge Instructions    Diet - low sodium heart healthy   Complete by:  As directed    Discharge instructions   Complete by:  As directed    From Dr. Loleta Books: You were admitted to the hospital with anemia (low blood levels).  Probably the lowest I have ever seen.  This occurred Dr. Benson Norway and I believe because of slow bleeding over months, from  those small blood vessel spots in your intestines (they are called "AVMs".  The ones he saw, Dr. Benson Norway destroyed, so they shouldn't bleed.  However, you may have others that will leak a tiny amount of blood over time, so it is important that you have your blood checked for hemoglobin (blood level) and iron levels.  Take Iron every day, three times a day.   If that is too much (hurts your stomach or causes constipation) take it every other day.  Take iron with Colace or another stool softener Take orange juice at the same time as your iron.  Follow up with Dr. Redmond School in the next 2 weeks. Call up to [place] and hunt around for a new primary care doctor up there, before you move.  Discuss with Dr. Redmond School if you need iron infusions by a blood specialist from time to time.  ALSO: while you were here, there was a possibility that you had a pneumonia.  For this reason, you were treated with antibiotics, and I want you to finish the course Cefpodoxime/Azithromycin. Take the cefpodoxime (Vantin) tonight and twice tomorrow Take the azithromycin tonight and tomorrow night  Continue taking protonix (this is an acid medicine) and it may slow bleeding from your AVMs in the future.   Increase activity slowly   Complete by:  As directed      Allergies as of 09/14/2017   No Known Allergies     Medication List    TAKE these medications   azithromycin 250 MG tablet Commonly known as:  ZITHROMAX Take  1 tablet (250 mg total) by mouth daily.   cefpodoxime 200 MG tablet Commonly known as:  VANTIN Take 1 tablet (200 mg total) by mouth 2 (two) times daily.   docusate sodium 100 MG capsule Commonly known as:  COLACE Take 1 capsule (100 mg total) by mouth 2 (two) times daily.   ferrous sulfate 325 (65 FE) MG tablet Take 1 tablet (325 mg total) by mouth 3 (three) times daily with meals. What changed:  when to take this   folic acid 1 MG tablet Commonly known as:  FOLVITE Take 1 tablet (1 mg  total) by mouth daily.   nicotine 21 mg/24hr patch Commonly known as:  NICODERM CQ - dosed in mg/24 hours Place 1 patch (21 mg total) onto the skin daily.   pantoprazole 40 MG tablet Commonly known as:  PROTONIX Take 1 tablet (40 mg total) by mouth daily at 6 (six) AM.   thiamine 100 MG tablet Take 1 tablet (100 mg total) by mouth daily.       No Known Allergies  Consultations:  PCCM  GI   Procedures/Studies: Ct Chest W Contrast  Result Date: 09/09/2017 CLINICAL DATA:  Nausea and vomiting. Generalized weakness. Shortness of breath. Weight loss. EXAM: CT CHEST, ABDOMEN, AND PELVIS WITH CONTRAST TECHNIQUE: Multidetector CT imaging of the chest, abdomen and pelvis was performed following the standard protocol during bolus administration of intravenous contrast. CONTRAST:  122mL ISOVUE-300 IOPAMIDOL (ISOVUE-300) INJECTION 61% COMPARISON:  Chest radiograph earlier this day. Abdominal CT 08/21/2015 FINDINGS: CT CHEST FINDINGS Cardiovascular: Heart size is normal. Coronary artery calcifications versus stents. Thoracic aorta is normal in caliber without dissection. Mild calcified and noncalcified atheromatous plaque. Left vertebral artery arises directly from the aorta, a normal variant. No pericardial fluid. Mediastinum/Nodes: No enlarged mediastinal or hilar lymph nodes. The esophagus is decompressed, small hiatal hernia. Visualized thyroid gland is normal. Lungs/Pleura: Mild apical predominant emphysema. Mild central bronchial wall thickening. Scattered bilateral fissure oral thickening. No pulmonary mass or suspicious nodule. Minimal secretions in the trachea an bronchi. No focal consolidation. No pleural fluid or pulmonary edema. Musculoskeletal: Mild marrow heterogeneity without evident focal lytic or blastic lesion. CT ABDOMEN PELVIS FINDINGS Hepatobiliary: Diffusely decreased hepatic density. No focal hepatic lesion. Gallbladder physiologically distended, no calcified stone. No biliary  dilatation. Pancreas: No ductal dilatation or inflammation. Spleen: Normal in size without focal abnormality. Adrenals/Urinary Tract: Mild bilateral adrenal thickening without dominant nodule. No hydronephrosis or perinephric edema. Homogeneous renal enhancement with symmetric excretion on delayed phase imaging. Urinary bladder is physiologically distended without wall thickening. Stomach/Bowel: Bowel evaluation is limited in the absence of enteric contrast. Diffuse gastric wall thickening, prominent about the greater curvature, similar to progressed from prior exam. 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 Majority of small bowel is nondistended. No evidence of small bowel inflammation. Minimal wall thickening of the proximal ascending colon without pericolonic inflammation. Small to moderate colonic stool burden. Vascular/Lymphatic: Moderate calcified and noncalcified aorta bi-iliac atherosclerosis. No aneurysm. Portal vein is grossly patent. No enlarged abdominal or pelvic lymph nodes. Reproductive: Prostatic calcifications. Other: No ascites or free air.  No intra-abdominal abscess. Musculoskeletal: Probable vertebral body hemangioma within L5. 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 No acute osseous abnormality. IMPRESSION: 1. Gastric wall thickening, similar to mildly progressed from prior exam. 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 2. Mild wall thickening of the ascending colon, may be secondary to nondistention or portal gastropathy. 3. Hepatic steatosis and hepatomegaly. 4. Emphysema without acute chest finding. Aortic atherosclerosis and coronary artery calcification.  Aortic Atherosclerosis (ICD10-I70.0) and Emphysema (ICD10-J43.9). Electronically Signed   By: Jeb Levering M.D.   On: 09/09/2017 22:04   Ct Abdomen Pelvis W Contrast  Result Date: 09/09/2017 CLINICAL DATA:  Nausea and vomiting. Generalized weakness. Shortness of breath. Weight loss. EXAM: CT CHEST, ABDOMEN, AND PELVIS  WITH CONTRAST TECHNIQUE: Multidetector CT imaging of the chest, abdomen and pelvis was performed following the standard protocol during bolus administration of intravenous contrast. CONTRAST:  162mL ISOVUE-300 IOPAMIDOL (ISOVUE-300) INJECTION 61% COMPARISON:  Chest radiograph earlier this day. Abdominal CT 08/21/2015 FINDINGS: CT CHEST FINDINGS Cardiovascular: Heart size is normal. Coronary artery calcifications versus stents. Thoracic aorta is normal in caliber without dissection. Mild calcified and noncalcified atheromatous plaque. Left vertebral artery arises directly from the aorta, a normal variant. No pericardial fluid. Mediastinum/Nodes: No enlarged mediastinal or hilar lymph nodes. The esophagus is decompressed, small hiatal hernia. Visualized thyroid gland is normal. Lungs/Pleura: Mild apical predominant emphysema. Mild central bronchial wall thickening. Scattered bilateral fissure oral thickening. No pulmonary mass or suspicious nodule. Minimal secretions in the trachea an bronchi. No focal consolidation. No pleural fluid or pulmonary edema. Musculoskeletal: Mild marrow heterogeneity without evident focal lytic or blastic lesion. CT ABDOMEN PELVIS FINDINGS Hepatobiliary: Diffusely decreased hepatic density. No focal hepatic lesion. Gallbladder physiologically distended, no calcified stone. No biliary dilatation. Pancreas: No ductal dilatation or inflammation. Spleen: Normal in size without focal abnormality. Adrenals/Urinary Tract: Mild bilateral adrenal thickening without dominant nodule. No hydronephrosis or perinephric edema. Homogeneous renal enhancement with symmetric excretion on delayed phase imaging. Urinary bladder is physiologically distended without wall thickening. Stomach/Bowel: Bowel evaluation is limited in the absence of enteric contrast. Diffuse gastric wall thickening, prominent about the greater curvature, similar to progressed from prior exam. 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 Majority of  small bowel is nondistended. No evidence of small bowel inflammation. Minimal wall thickening of the proximal ascending colon without pericolonic inflammation. Small to moderate colonic stool burden. Vascular/Lymphatic: Moderate calcified and noncalcified aorta bi-iliac atherosclerosis. No aneurysm. Portal vein is grossly patent. No enlarged abdominal or pelvic lymph nodes. Reproductive: Prostatic calcifications. Other: No ascites or free air.  No intra-abdominal abscess. Musculoskeletal: Probable vertebral body hemangioma within L5. 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 No acute osseous abnormality. IMPRESSION: 1. Gastric wall thickening, similar to mildly progressed from prior exam. 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 2. Mild wall thickening of the ascending colon, may be secondary to nondistention or portal gastropathy. 3. Hepatic steatosis and hepatomegaly. 4. Emphysema without acute chest finding. Aortic atherosclerosis and coronary artery calcification. Aortic Atherosclerosis (ICD10-I70.0) and Emphysema (ICD10-J43.9). Electronically Signed   By: Jeb Levering M.D.   On: 09/09/2017 22:04   Dg Chest Port 1 View  Result Date: 09/11/2017 CLINICAL DATA:  Cough. EXAM: PORTABLE CHEST 1 VIEW COMPARISON:  CT scan and radiograph of September 09, 2017. FINDINGS: Stable cardiomediastinal silhouette. No pneumothorax or pleural effusion is noted. Mildly increased bilateral perihilar and infrahilar opacities are noted suggesting worsening edema or inflammation. Bony thorax is unremarkable. IMPRESSION: Mildly increased bilateral perihilar and infrahilar opacities are noted suggesting worsening inflammation or edema. Electronically Signed   By: Marijo Conception, M.D.   On: 09/11/2017 09:08   Dg Chest Port 1 View  Result Date: 09/09/2017 CLINICAL DATA:  Fever, cough EXAM: PORTABLE CHEST 1 VIEW COMPARISON:  Portable exam 2021 hours compared to 08/20/2015 FINDINGS: Normal heart size, mediastinal  contours, and pulmonary vascularity. Hyperinflated lungs with minimal chronic interstitial prominence. No acute infiltrate, pleural effusion or pneumothorax. Bones demineralized. IMPRESSION: Hyperinflation  and minimal chronic interstitial prominence without acute abnormalities. Electronically Signed   By: Lavonia Dana M.D.   On: 09/09/2017 20:36   EGD with small bowel push enteroscopy Findings: Two 2 mm no bleeding angiodysplastic lesions were found in the gastric body. Coagulation for tissue destruction using monopolar probe was successful. Estimated blood loss: none. The examined duodenum was normal. Two angiodysplastic lesions with no bleeding were found in the proximal jejunum. Coagulation for tissue destruction using monopolar probe was successful. - Normal esophagus. - Two non-bleeding angiodysplastic lesions in the stomach. Treated with a monopolar probe. - Normal examined duodenum. - Two non-bleeding angiodysplastic lesions in the jejunum. Treated with a monopolar probe. - Normal examined duodenum. - Two non-bleeding angiodysplastic lesions in the jejunum. Treated with a monopolar probe. - No specimens collected. - Return patient to hospital ward for ongoing care. - Resume regular diet. - Oral iron supplemenation. - Possibly follow with Hematology for routine or semi-routine   Colonosccopy The entire examined colon appeared normal. Findings: - The entire examined colon is normal. - No specimens collected.     Subjective: Feels fine.  Good appetite.  Ambulating well.  No melena, hematochezia, vomiting, abdominal pain.  Discharge Exam: Vitals:   09/14/17 0532 09/14/17 1306  BP: 112/63 130/82  Pulse: 80 83  Resp: (!) 21 (!) 23  Temp: 98.7 F (37.1 C) 97.9 F (36.6 C)  SpO2: 99% 97%   Vitals:   09/13/17 1532 09/13/17 2100 09/14/17 0532 09/14/17 1306  BP: 107/81 120/64 112/63 130/82  Pulse: 85 85 80 83  Resp: 18 (!) 21 (!) 21 (!) 23  Temp: (!) 97.4 F (36.3 C)  97.9 F (36.6 C) 98.7 F (37.1 C) 97.9 F (36.6 C)  TempSrc: Axillary Oral Oral Oral  SpO2: 100% 96% 99% 97%  Weight:   50 kg (110 lb 3.2 oz)   Height:        General: Pt is alert, awake, not in acute distress Cardiovascular: RRR, S1/S2 +, no rubs, no gallops Respiratory: CTA bilaterally, no wheezing, no rhonchi Abdominal: Soft, NT, ND, bowel sounds + Extremities: no edema, no cyanosis    The results of significant diagnostics from this hospitalization (including imaging, microbiology, ancillary and laboratory) are listed below for reference.     Microbiology: Recent Results (from the past 240 hour(s))  Blood culture (routine x 2)     Status: None   Collection Time: 09/09/17  8:27 PM  Result Value Ref Range Status   Specimen Description BLOOD RIGHT ANTECUBITAL  Final   Special Requests   Final    BOTTLES DRAWN AEROBIC AND ANAEROBIC Blood Culture adequate volume   Culture NO GROWTH 5 DAYS  Final   Report Status 09/14/2017 FINAL  Final  Blood culture (routine x 2)     Status: None   Collection Time: 09/09/17  8:50 PM  Result Value Ref Range Status   Specimen Description BLOOD BLOOD LEFT FOREARM  Final   Special Requests   Final    BOTTLES DRAWN AEROBIC AND ANAEROBIC Blood Culture adequate volume   Culture NO GROWTH 5 DAYS  Final   Report Status 09/14/2017 FINAL  Final  Urine culture     Status: Abnormal   Collection Time: 09/10/17  2:01 AM  Result Value Ref Range Status   Specimen Description URINE, CLEAN CATCH  Final   Special Requests NONE  Final   Culture >=100,000 COLONIES/mL STAPHYLOCOCCUS EPIDERMIDIS (A)  Final   Report Status 09/12/2017 FINAL  Final  Organism ID, Bacteria STAPHYLOCOCCUS EPIDERMIDIS (A)  Final      Susceptibility   Staphylococcus epidermidis - MIC*    CIPROFLOXACIN <=0.5 SENSITIVE Sensitive     GENTAMICIN <=0.5 SENSITIVE Sensitive     NITROFURANTOIN <=16 SENSITIVE Sensitive     OXACILLIN <=0.25 SENSITIVE Sensitive     TETRACYCLINE 2 SENSITIVE  Sensitive     VANCOMYCIN <=0.5 SENSITIVE Sensitive     TRIMETH/SULFA 20 SENSITIVE Sensitive     CLINDAMYCIN <=0.25 SENSITIVE Sensitive     RIFAMPIN <=0.5 SENSITIVE Sensitive     Inducible Clindamycin NEGATIVE Sensitive     * >=100,000 COLONIES/mL STAPHYLOCOCCUS EPIDERMIDIS  MRSA PCR Screening     Status: None   Collection Time: 09/10/17  2:26 AM  Result Value Ref Range Status   MRSA by PCR NEGATIVE NEGATIVE Final    Comment:        The GeneXpert MRSA Assay (FDA approved for NASAL specimens only), is one component of a comprehensive MRSA colonization surveillance program. It is not intended to diagnose MRSA infection nor to guide or monitor treatment for MRSA infections.      Labs: BNP (last 3 results) No results for input(s): BNP in the last 8760 hours. Basic Metabolic Panel: Recent Labs  Lab 09/10/17 0832 09/11/17 0724 09/12/17 0350 09/13/17 0344 09/14/17 0429  NA 127* 125* 130* 126* 134*  K 3.7 3.3* 3.8 3.2* 4.1  CL 99* 99* 103 99* 108  CO2 20* 18* 21* 19* 20*  GLUCOSE 97 93 83 97 107*  BUN <5* <5* <5* <5* <5*  CREATININE 0.62 0.50* 0.53* 0.41* 0.58*  CALCIUM 7.4* 7.6* 8.1* 7.6* 8.1*  MG 1.8  --  1.6*  --   --    Liver Function Tests: Recent Labs  Lab 09/09/17 1927 09/10/17 0832 09/12/17 0350  AST 68* 56* 51*  ALT 53 45 40  ALKPHOS 188* 149* 138*  BILITOT 1.1 3.0* 1.5*  PROT 7.4 6.1* 6.2*  ALBUMIN 3.0* 2.4* 2.4*   No results for input(s): LIPASE, AMYLASE in the last 168 hours. No results for input(s): AMMONIA in the last 168 hours. CBC: Recent Labs  Lab 09/09/17 2050 09/10/17 0832 09/10/17 2158 09/11/17 0724 09/12/17 0350 09/13/17 0344 09/14/17 0429  WBC 10.9* 7.8  --  8.6 13.2* 10.2 10.4  NEUTROABS 9.0*  --   --   --   --   --   --   HGB 2.1* 7.1*  7.1* 6.7* 8.0* 8.4* 8.9* 8.9*  HCT 8.8* 22.4*  22.2* 21.7* 25.3* 27.0* 27.9* 29.7*  MCV 59.1* 74.9*  --  76.2* 76.3* 78.4 80.1  PLT 165 114*  --  139* 165 190 262   Cardiac Enzymes: No  results for input(s): CKTOTAL, CKMB, CKMBINDEX, TROPONINI in the last 168 hours. BNP: Invalid input(s): POCBNP CBG: Recent Labs  Lab 09/13/17 0745 09/13/17 1126 09/13/17 1622 09/13/17 2103 09/14/17 0755  GLUCAP 93 98 99 90 109*   D-Dimer No results for input(s): DDIMER in the last 72 hours. Hgb A1c No results for input(s): HGBA1C in the last 72 hours. Lipid Profile No results for input(s): CHOL, HDL, LDLCALC, TRIG, CHOLHDL, LDLDIRECT in the last 72 hours. Thyroid function studies No results for input(s): TSH, T4TOTAL, T3FREE, THYROIDAB in the last 72 hours.  Invalid input(s): FREET3 Anemia work up No results for input(s): VITAMINB12, FOLATE, FERRITIN, TIBC, IRON, RETICCTPCT in the last 72 hours. Urinalysis    Component Value Date/Time   COLORURINE YELLOW 09/10/2017 0201   APPEARANCEUR CLEAR 09/10/2017  0201   LABSPEC 1.045 (H) 09/10/2017 0201   PHURINE 6.0 09/10/2017 0201   GLUCOSEU NEGATIVE 09/10/2017 0201   HGBUR NEGATIVE 09/10/2017 0201   BILIRUBINUR NEGATIVE 09/10/2017 0201   KETONESUR 5 (A) 09/10/2017 0201   PROTEINUR NEGATIVE 09/10/2017 0201   NITRITE NEGATIVE 09/10/2017 0201   LEUKOCYTESUR NEGATIVE 09/10/2017 0201   Sepsis Labs Invalid input(s): PROCALCITONIN,  WBC,  LACTICIDVEN Microbiology Recent Results (from the past 240 hour(s))  Blood culture (routine x 2)     Status: None   Collection Time: 09/09/17  8:27 PM  Result Value Ref Range Status   Specimen Description BLOOD RIGHT ANTECUBITAL  Final   Special Requests   Final    BOTTLES DRAWN AEROBIC AND ANAEROBIC Blood Culture adequate volume   Culture NO GROWTH 5 DAYS  Final   Report Status 09/14/2017 FINAL  Final  Blood culture (routine x 2)     Status: None   Collection Time: 09/09/17  8:50 PM  Result Value Ref Range Status   Specimen Description BLOOD BLOOD LEFT FOREARM  Final   Special Requests   Final    BOTTLES DRAWN AEROBIC AND ANAEROBIC Blood Culture adequate volume   Culture NO GROWTH 5 DAYS   Final   Report Status 09/14/2017 FINAL  Final  Urine culture     Status: Abnormal   Collection Time: 09/10/17  2:01 AM  Result Value Ref Range Status   Specimen Description URINE, CLEAN CATCH  Final   Special Requests NONE  Final   Culture >=100,000 COLONIES/mL STAPHYLOCOCCUS EPIDERMIDIS (A)  Final   Report Status 09/12/2017 FINAL  Final   Organism ID, Bacteria STAPHYLOCOCCUS EPIDERMIDIS (A)  Final      Susceptibility   Staphylococcus epidermidis - MIC*    CIPROFLOXACIN <=0.5 SENSITIVE Sensitive     GENTAMICIN <=0.5 SENSITIVE Sensitive     NITROFURANTOIN <=16 SENSITIVE Sensitive     OXACILLIN <=0.25 SENSITIVE Sensitive     TETRACYCLINE 2 SENSITIVE Sensitive     VANCOMYCIN <=0.5 SENSITIVE Sensitive     TRIMETH/SULFA 20 SENSITIVE Sensitive     CLINDAMYCIN <=0.25 SENSITIVE Sensitive     RIFAMPIN <=0.5 SENSITIVE Sensitive     Inducible Clindamycin NEGATIVE Sensitive     * >=100,000 COLONIES/mL STAPHYLOCOCCUS EPIDERMIDIS  MRSA PCR Screening     Status: None   Collection Time: 09/10/17  2:26 AM  Result Value Ref Range Status   MRSA by PCR NEGATIVE NEGATIVE Final    Comment:        The GeneXpert MRSA Assay (FDA approved for NASAL specimens only), is one component of a comprehensive MRSA colonization surveillance program. It is not intended to diagnose MRSA infection nor to guide or monitor treatment for MRSA infections.      Time coordinating discharge: Over 30 minutes  SIGNED:   Edwin Dada, MD  [facility] 09/14/2017, 7:43 PM

## 2017-09-14 NOTE — Care Management Note (Signed)
Case Management Note  Patient Details  Name: James [name] MRN: [id number] Date of Birth: 05-05-1956  Subjective/Objective: Pt presented for Anemia- initially home with wife and children. Plan for d/c home today. Pt is not home bound for [facility].                    Action/Plan: CM did speak with pt in regards to disposition needs. Per pt he gets around good, no needs for [facility]. CM did make RN and MD aware that pt will not benefit from [place]. No further needs from CM at this time.   Expected Discharge Date:  09/12/17               Expected Discharge Plan:  Home/Self Care  In-House Referral:  NA  Discharge planning Services  CM Consult, Homebound not met per provider  Post Acute Care Choice:  NA Choice offered to:  NA  DME Arranged:  N/A DME Agency:  NA  HH Arranged:  NA HH Agency:  NA  Status of Service:  Completed, signed off  If discussed at [place] of Stay Meetings, dates discussed:    Additional Comments:  Bethena Roys, RN 09/14/2017, 12:58 PM

## 2017-09-23 ENCOUNTER — Telehealth: Payer: Self-pay

## 2017-09-23 NOTE — Telephone Encounter (Signed)
James [name] called to let the office know that James [name] was referred to her nurse case management work load. Pt was contacted several time and no reply has been made by pt. They just wanted to let our office know  Just in case he has an appointment wit [place] in the near future. Thanks

## 2018-03-01 IMAGING — US US ABDOMEN LIMITED
1 series · 14 of 25 positions shown · non-contrast
Comparison: 08/21/2015

CLINICAL DATA: Fatty liver.  Cirrhosis.  Alcoholism.

EXAM:
ULTRASOUND ABDOMEN LIMITED RIGHT UPPER QUADRANT

[Series 1: us abdomen limited · 0.15mm/px · 14 of 41 slices shown]
[im 1/41]
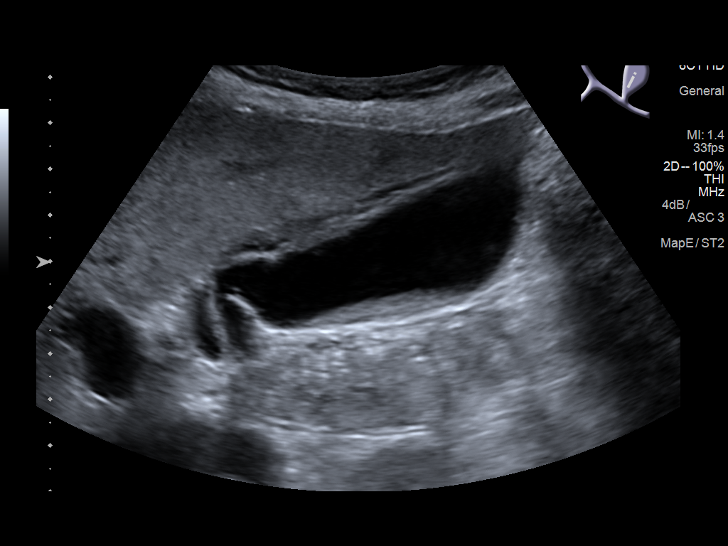
[im 4/41]
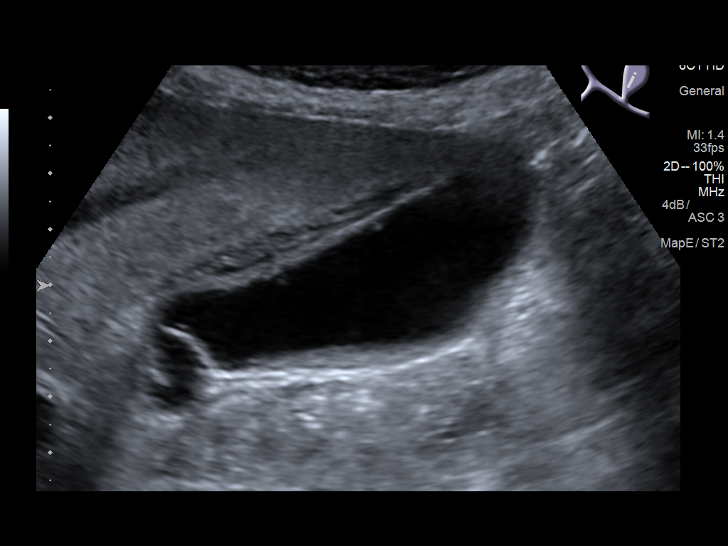
[im 7/41]
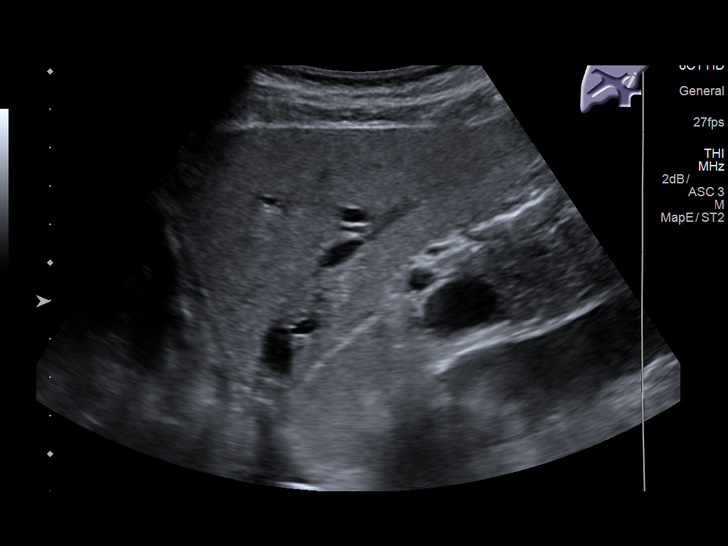
[im 11/41]
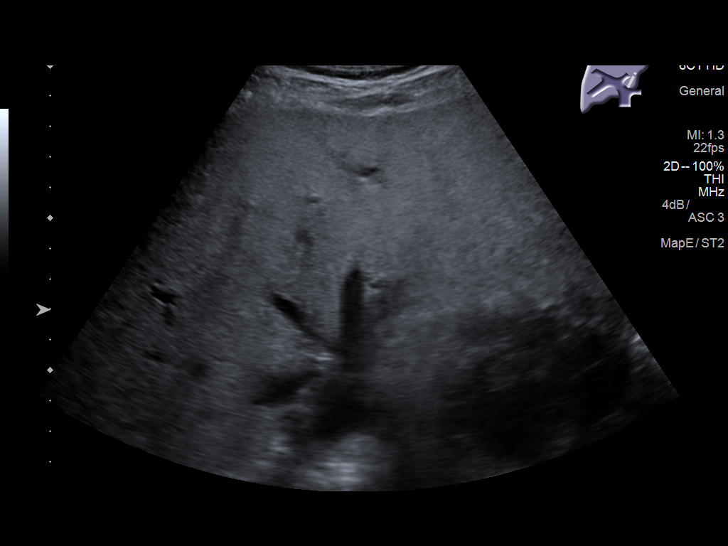
[im 14/41]
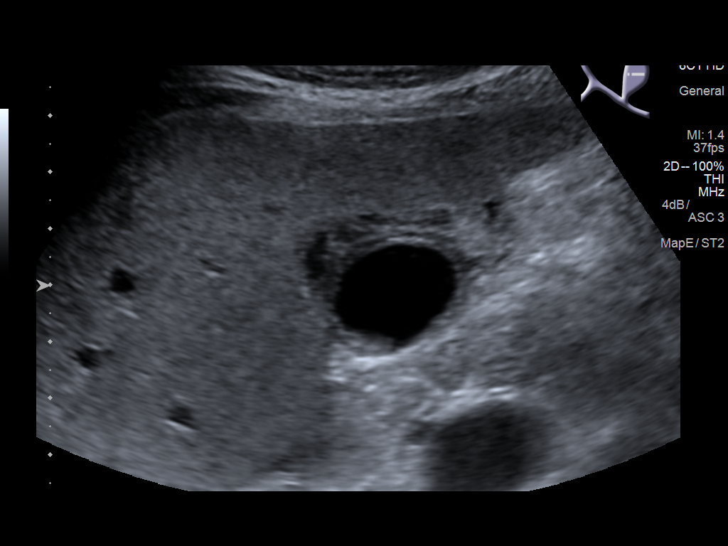
[im 16/41]
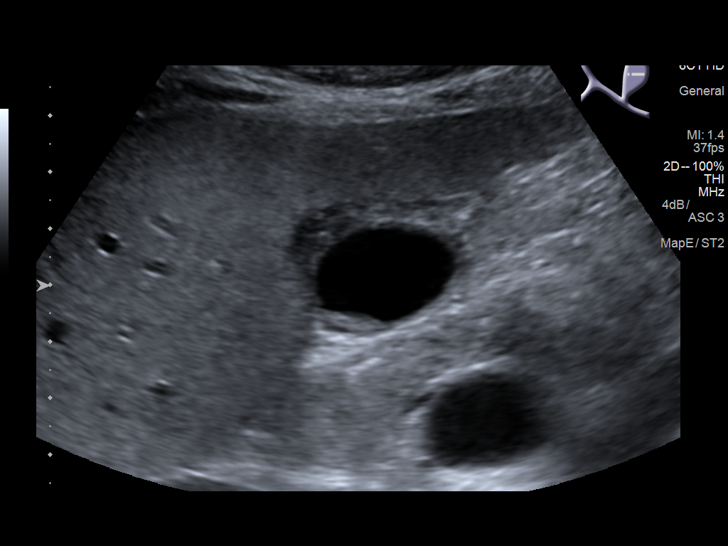
[im 19/41]
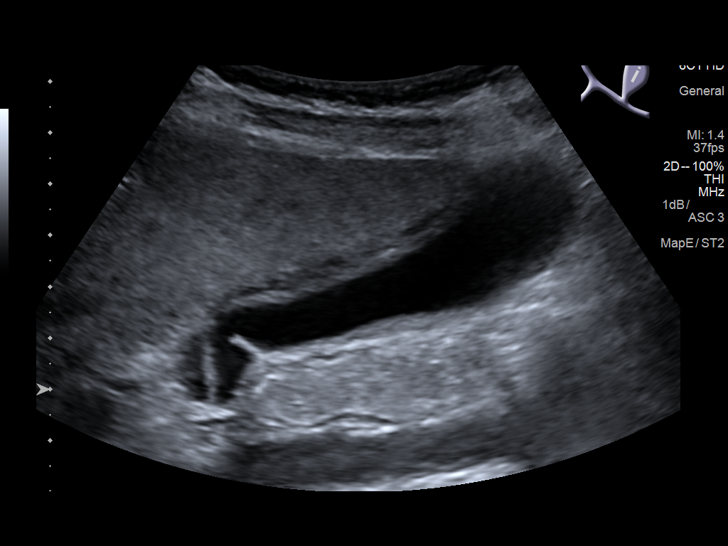
[im 22/41]
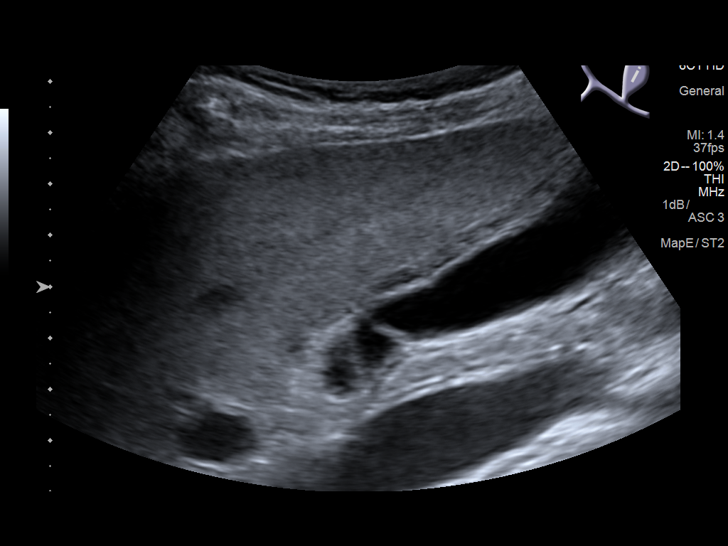
[im 26/41]
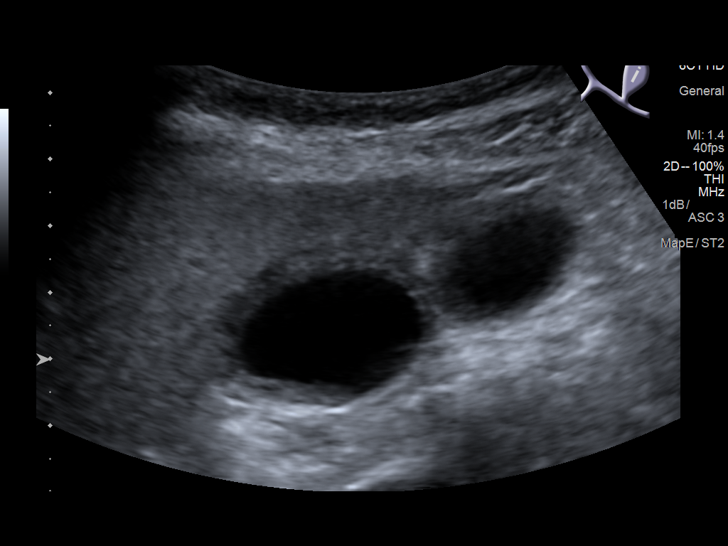
[im 27/41]
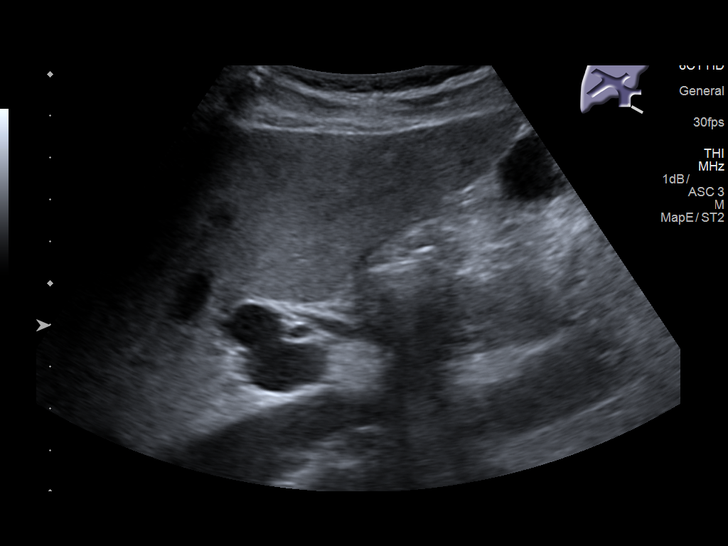
[im 31/41]
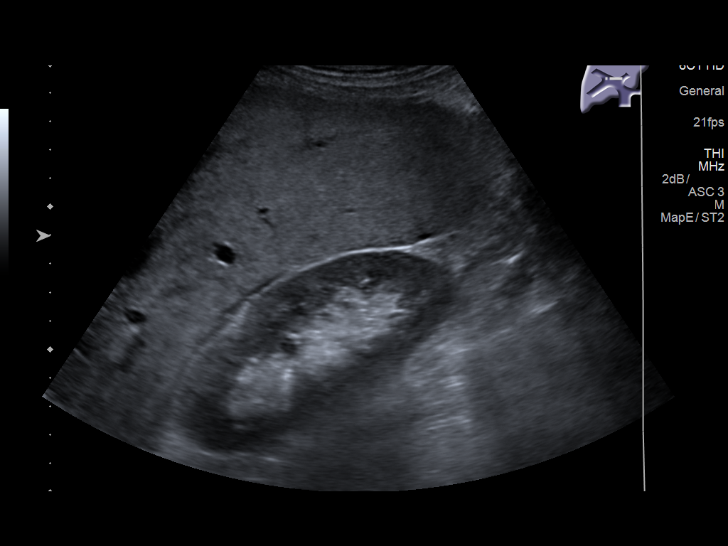
[im 34/41]
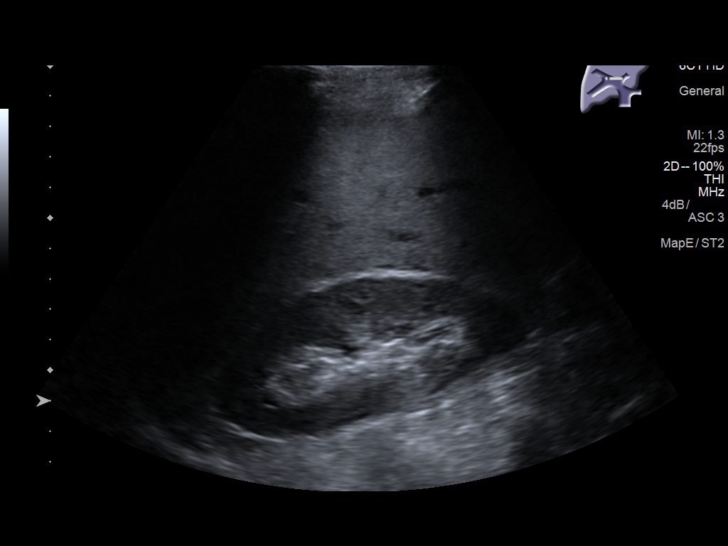
[im 37/41]
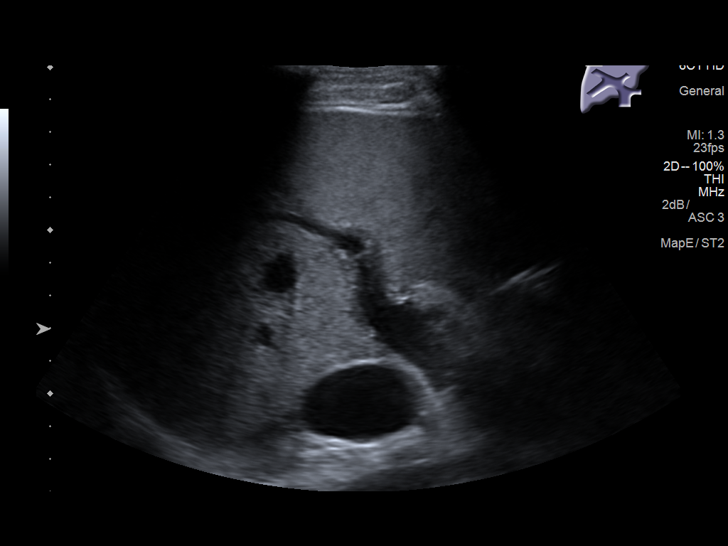
[im 41/41]
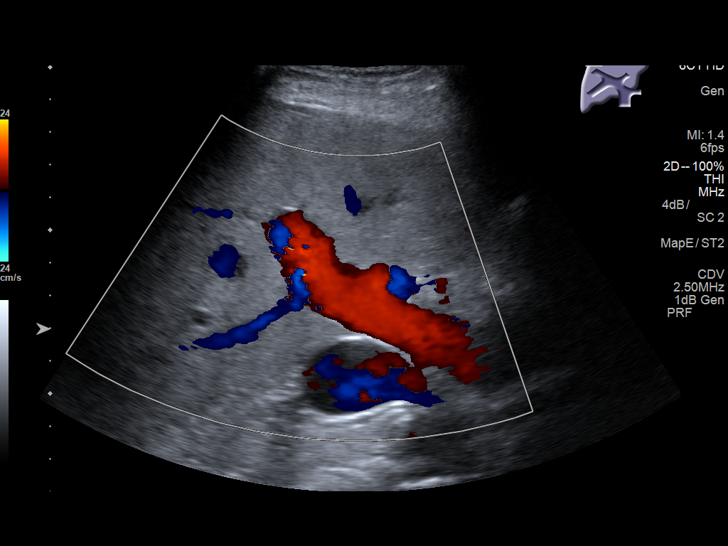

[14 of 25 positions shown; findings below may reference images not displayed]

FINDINGS: Gallbladder:

There are no gallstones. Negative Murphy sign. There is gallbladder
sludge. Gallbladder wall thickening and edema are present. Wall
measures up to 8 mm. There is some pericholecystic fluid.

Common bile duct:

Diameter: 3 mm

Liver:

Liver is diffusely increased in echogenicity without focal mass.
IMPRESSION: There is nonspecific gallbladder wall thickening and edema. No
gallstone or Murphy sign is appreciated.

Diffuse hepatic steatosis.

## 2018-03-14 ENCOUNTER — Emergency Department (HOSPITAL_COMMUNITY): Payer: BC Managed Care – PPO

## 2018-03-14 ENCOUNTER — Other Ambulatory Visit: Payer: Self-pay

## 2018-03-14 ENCOUNTER — Inpatient Hospital Stay (HOSPITAL_COMMUNITY): Payer: BC Managed Care – PPO

## 2018-03-14 ENCOUNTER — Encounter (HOSPITAL_COMMUNITY): Payer: Self-pay

## 2018-03-14 ENCOUNTER — Inpatient Hospital Stay (HOSPITAL_COMMUNITY)
Admission: EM | Admit: 2018-03-14 | Discharge: 2018-04-04 | DRG: 871 | Disposition: A | Discharge status: Hospice | Payer: BC Managed Care – PPO | Attending: Family Medicine | Admitting: Family Medicine

## 2018-03-14 DIAGNOSIS — Z7189 Other specified counseling: Secondary | ICD-10-CM | POA: Diagnosis not present

## 2018-03-14 DIAGNOSIS — Z681 Body mass index (BMI) 19 or less, adult: Secondary | ICD-10-CM

## 2018-03-14 DIAGNOSIS — G9341 Metabolic encephalopathy: Secondary | ICD-10-CM | POA: Diagnosis not present

## 2018-03-14 DIAGNOSIS — F1010 Alcohol abuse, uncomplicated: Secondary | ICD-10-CM | POA: Diagnosis not present

## 2018-03-14 DIAGNOSIS — R64 Cachexia: Secondary | ICD-10-CM | POA: Diagnosis present

## 2018-03-14 DIAGNOSIS — E43 Unspecified severe protein-calorie malnutrition: Secondary | ICD-10-CM | POA: Diagnosis present

## 2018-03-14 DIAGNOSIS — R197 Diarrhea, unspecified: Secondary | ICD-10-CM | POA: Diagnosis not present

## 2018-03-14 DIAGNOSIS — E46 Unspecified protein-calorie malnutrition: Secondary | ICD-10-CM

## 2018-03-14 DIAGNOSIS — E872 Acidosis, unspecified: Secondary | ICD-10-CM | POA: Diagnosis present

## 2018-03-14 DIAGNOSIS — D500 Iron deficiency anemia secondary to blood loss (chronic): Secondary | ICD-10-CM | POA: Diagnosis present

## 2018-03-14 DIAGNOSIS — J189 Pneumonia, unspecified organism: Secondary | ICD-10-CM

## 2018-03-14 DIAGNOSIS — R4182 Altered mental status, unspecified: Secondary | ICD-10-CM | POA: Diagnosis not present

## 2018-03-14 DIAGNOSIS — D509 Iron deficiency anemia, unspecified: Secondary | ICD-10-CM | POA: Diagnosis not present

## 2018-03-14 DIAGNOSIS — Z720 Tobacco use: Secondary | ICD-10-CM | POA: Diagnosis present

## 2018-03-14 DIAGNOSIS — K766 Portal hypertension: Secondary | ICD-10-CM | POA: Diagnosis present

## 2018-03-14 DIAGNOSIS — Z515 Encounter for palliative care: Secondary | ICD-10-CM | POA: Diagnosis not present

## 2018-03-14 DIAGNOSIS — D649 Anemia, unspecified: Secondary | ICD-10-CM | POA: Diagnosis present

## 2018-03-14 DIAGNOSIS — I351 Nonrheumatic aortic (valve) insufficiency: Secondary | ICD-10-CM | POA: Diagnosis present

## 2018-03-14 DIAGNOSIS — T454X6A Underdosing of iron and its compounds, initial encounter: Secondary | ICD-10-CM | POA: Diagnosis present

## 2018-03-14 DIAGNOSIS — K31819 Angiodysplasia of stomach and duodenum without bleeding: Secondary | ICD-10-CM | POA: Diagnosis present

## 2018-03-14 DIAGNOSIS — Z9189 Other specified personal risk factors, not elsewhere classified: Secondary | ICD-10-CM | POA: Diagnosis not present

## 2018-03-14 DIAGNOSIS — S065X9A Traumatic subdural hemorrhage with loss of consciousness of unspecified duration, initial encounter: Secondary | ICD-10-CM | POA: Diagnosis present

## 2018-03-14 DIAGNOSIS — R1310 Dysphagia, unspecified: Secondary | ICD-10-CM | POA: Diagnosis present

## 2018-03-14 DIAGNOSIS — Z431 Encounter for attention to gastrostomy: Secondary | ICD-10-CM

## 2018-03-14 DIAGNOSIS — A0472 Enterocolitis due to Clostridium difficile, not specified as recurrent: Secondary | ICD-10-CM | POA: Diagnosis present

## 2018-03-14 DIAGNOSIS — Z87891 Personal history of nicotine dependence: Secondary | ICD-10-CM | POA: Diagnosis not present

## 2018-03-14 DIAGNOSIS — G92 Toxic encephalopathy: Secondary | ICD-10-CM | POA: Diagnosis present

## 2018-03-14 DIAGNOSIS — K7031 Alcoholic cirrhosis of liver with ascites: Secondary | ICD-10-CM | POA: Diagnosis not present

## 2018-03-14 DIAGNOSIS — G039 Meningitis, unspecified: Secondary | ICD-10-CM | POA: Diagnosis present

## 2018-03-14 DIAGNOSIS — Z978 Presence of other specified devices: Secondary | ICD-10-CM | POA: Diagnosis not present

## 2018-03-14 DIAGNOSIS — Z4659 Encounter for fitting and adjustment of other gastrointestinal appliance and device: Secondary | ICD-10-CM

## 2018-03-14 DIAGNOSIS — Y92009 Unspecified place in unspecified non-institutional (private) residence as the place of occurrence of the external cause: Secondary | ICD-10-CM | POA: Diagnosis not present

## 2018-03-14 DIAGNOSIS — Z9114 Patient's other noncompliance with medication regimen: Secondary | ICD-10-CM

## 2018-03-14 DIAGNOSIS — F10239 Alcohol dependence with withdrawal, unspecified: Secondary | ICD-10-CM | POA: Diagnosis present

## 2018-03-14 DIAGNOSIS — D6959 Other secondary thrombocytopenia: Secondary | ICD-10-CM | POA: Diagnosis present

## 2018-03-14 DIAGNOSIS — D689 Coagulation defect, unspecified: Secondary | ICD-10-CM | POA: Diagnosis present

## 2018-03-14 DIAGNOSIS — W19XXXA Unspecified fall, initial encounter: Secondary | ICD-10-CM | POA: Diagnosis present

## 2018-03-14 DIAGNOSIS — Z8249 Family history of ischemic heart disease and other diseases of the circulatory system: Secondary | ICD-10-CM

## 2018-03-14 DIAGNOSIS — Z66 Do not resuscitate: Secondary | ICD-10-CM | POA: Diagnosis not present

## 2018-03-14 DIAGNOSIS — Z5309 Procedure and treatment not carried out because of other contraindication: Secondary | ICD-10-CM

## 2018-03-14 DIAGNOSIS — D696 Thrombocytopenia, unspecified: Secondary | ICD-10-CM | POA: Diagnosis not present

## 2018-03-14 DIAGNOSIS — A414 Sepsis due to anaerobes: Principal | ICD-10-CM | POA: Diagnosis present

## 2018-03-14 DIAGNOSIS — E876 Hypokalemia: Secondary | ICD-10-CM | POA: Diagnosis present

## 2018-03-14 DIAGNOSIS — Z86010 Personal history of colonic polyps: Secondary | ICD-10-CM

## 2018-03-14 DIAGNOSIS — J849 Interstitial pulmonary disease, unspecified: Secondary | ICD-10-CM | POA: Diagnosis present

## 2018-03-14 DIAGNOSIS — Z91128 Patient's intentional underdosing of medication regimen for other reason: Secondary | ICD-10-CM

## 2018-03-14 LAB — CBG MONITORING, ED: GLUCOSE-CAPILLARY: 126 mg/dL — AB (ref 70–99)

## 2018-03-14 LAB — URINALYSIS, ROUTINE W REFLEX MICROSCOPIC
Bilirubin Urine: NEGATIVE
Glucose, UA: NEGATIVE mg/dL
Ketones, ur: 20 mg/dL — AB
Leukocytes, UA: NEGATIVE
Nitrite: NEGATIVE
PH: 5 (ref 5.0–8.0)
Protein, ur: NEGATIVE mg/dL
SPECIFIC GRAVITY, URINE: 1.009 (ref 1.005–1.030)

## 2018-03-14 LAB — GLUCOSE, CAPILLARY: Glucose-Capillary: 70 mg/dL (ref 70–99)

## 2018-03-14 LAB — COMPREHENSIVE METABOLIC PANEL
ALBUMIN: 2.9 g/dL — AB (ref 3.5–5.0)
ALK PHOS: 167 U/L — AB (ref 38–126)
ALT: 34 U/L (ref 0–44)
AST: 64 U/L — AB (ref 15–41)
Anion gap: 28 — ABNORMAL HIGH (ref 5–15)
CALCIUM: 9.2 mg/dL (ref 8.9–10.3)
CHLORIDE: 98 mmol/L (ref 98–111)
CO2: 8 mmol/L — AB (ref 22–32)
CREATININE: 0.8 mg/dL (ref 0.61–1.24)
GFR calc Af Amer: >60 mL/min (ref >60)
GFR calc non Af Amer: >60 mL/min (ref >60)
GLUCOSE: 118 mg/dL — AB (ref 70–99)
Potassium: 3.6 mmol/L (ref 3.5–5.1)
SODIUM: 134 mmol/L — AB (ref 135–145)
Total Bilirubin: 1.7 mg/dL — ABNORMAL HIGH (ref 0.3–1.2)
Total Protein: 7.4 g/dL (ref 6.5–8.1)

## 2018-03-14 LAB — RAPID URINE DRUG SCREEN, HOSP PERFORMED
Amphetamines: NOT DETECTED
BENZODIAZEPINES: NOT DETECTED
COCAINE: NOT DETECTED
Opiates: NOT DETECTED
TETRAHYDROCANNABINOL: POSITIVE — AB

## 2018-03-14 LAB — TROPONIN I: Troponin I: <0.03 ng/mL (ref <0.03)

## 2018-03-14 LAB — I-STAT TROPONIN, ED: Troponin i, poc: 0.01 ng/mL (ref 0.00–0.08)

## 2018-03-14 LAB — I-STAT CG4 LACTIC ACID, ED: Lactic Acid, Venous: >17 mmol/L (ref 0.5–1.9)

## 2018-03-14 LAB — LIPASE, BLOOD: Lipase: 27 U/L (ref 11–51)

## 2018-03-14 LAB — POC OCCULT BLOOD, ED: Fecal Occult Bld: NEGATIVE

## 2018-03-14 MED ORDER — METHYLPREDNISOLONE SODIUM SUCC 125 MG IJ SOLR
125.0000 mg | Freq: Once | INTRAMUSCULAR | Status: AC
  Administered 2018-03-14: 125 mg via INTRAVENOUS
  Filled 2018-03-14: qty 2

## 2018-03-14 MED ORDER — SODIUM CHLORIDE 0.9 % IV SOLN
INTRAVENOUS | Status: DC
  Administered 2018-03-14 – 2018-03-16 (×3): via INTRAVENOUS

## 2018-03-14 MED ORDER — SODIUM CHLORIDE 0.9 % IV SOLN
10.0000 mL/h | Freq: Once | INTRAVENOUS | Status: AC
  Administered 2018-03-14: 10 mL/h via INTRAVENOUS

## 2018-03-14 MED ORDER — FAMOTIDINE IN NACL 20-0.9 MG/50ML-% IV SOLN
20.0000 mg | Freq: Two times a day (BID) | INTRAVENOUS | Status: DC
  Administered 2018-03-14: 20 mg via INTRAVENOUS
  Filled 2018-03-14 (×2): qty 50

## 2018-03-14 MED ORDER — DIPHENHYDRAMINE HCL 50 MG/ML IJ SOLN
25.0000 mg | Freq: Once | INTRAMUSCULAR | Status: AC
  Administered 2018-03-14: 25 mg via INTRAVENOUS
  Filled 2018-03-14: qty 1

## 2018-03-14 MED ORDER — ONDANSETRON HCL 4 MG/2ML IJ SOLN
4.0000 mg | Freq: Four times a day (QID) | INTRAMUSCULAR | Status: DC | PRN
  Administered 2018-03-22 – 2018-03-23 (×2): 4 mg via INTRAVENOUS
  Filled 2018-03-14 (×2): qty 2

## 2018-03-14 MED ORDER — SODIUM CHLORIDE 0.9 % IV BOLUS: 1000.0000 mL | Freq: Once | INTRAVENOUS | Status: DC

## 2018-03-14 MED ORDER — ONDANSETRON HCL 4 MG PO TABS: 4.0000 mg | ORAL_TABLET | Freq: Four times a day (QID) | ORAL | Status: DC | PRN

## 2018-03-14 MED ORDER — NICOTINE 14 MG/24HR TD PT24
14.0000 mg | MEDICATED_PATCH | Freq: Every day | TRANSDERMAL | Status: DC
  Administered 2018-03-15 – 2018-03-25 (×11): 14 mg via TRANSDERMAL
  Filled 2018-03-14 (×11): qty 1

## 2018-03-14 NOTE — ED Notes (Signed)
ED TO INPATIENT HANDOFF REPORT  Name/Age/Gender James [name] 62 y.o. male  Code Status    Code Status Orders  (From admission, onward)        Start     Ordered   03/14/18 2253  Full code  Continuous     03/14/18 2252    Code Status History    Date Active Date Inactive Code Status Order ID Comments User Context   09/10/2017 0102 09/14/2017 1959 Full Code [id number]  Arnell Asal, NP ED   03/02/2017 2314 03/04/2017 2156 Full Code [id number]  Ivor Costa, MD ED   09/17/2016 2152 09/21/2016 1818 Full Code [id number]  Etta Quill, DO ED   08/23/2015 1006 08/26/2015 1701 Full Code [id number]  Bonnielee Haff, MD Inpatient   08/21/2015 0513 08/21/2015 0540 Full Code [id number]  Ivor Costa, MD ED   01/01/2015 2218 01/05/2015 2232 Full Code [id number]  Deneise Lever, MD ED      Home/SNF/Other Home  Chief Complaint Fall; Weakness  Level of Care/Admitting Diagnosis ED Disposition    ED Disposition Condition [place]: [facility] [100102]  Level of Care: Stepdown [14]  Admit to SDU based on following criteria: Hemodynamic compromise or significant risk of instability:  Patient requiring short term acute titration and management of vasoactive drips, and invasive monitoring (i.e., CVP and Arterial line).  Diagnosis: Symptomatic anemia [[id number]]  Admitting Physician: Elwyn Reach [[id number]]  Attending Physician: Elwyn Reach [[id number]]  Estimated length of stay: past midnight tomorrow  Certification:: I certify this patient will need inpatient services for at least 2 midnights  PT Class (Do Not Modify): Inpatient [101]  PT Acc Code (Do Not Modify): Private [1]       Medical History Past Medical History:  Diagnosis Date  . Alcohol abuse   . Anemia due to GI blood loss 09/2011   microcytic.  transfused for Hgb 3.5, MCV in 50s.   . Aortic insufficiency   . Benign neoplasm of colon   . Black stools 08/21/2015  . Cachexia ([place])   .  Chest pain 08/21/2015  . Dysphagia   . ED (erectile dysfunction)   . Edema   . GERD (gastroesophageal reflux disease)   . ILD (interstitial lung disease) ([place])   . Iron deficiency anemia, unspecified   . Pulmonary nodule   . Reflux esophagitis   . Smoker   . Tobacco abuse   . Weight loss, abnormal     Allergies No Known Allergies  IV Location/Drains/Wounds Patient Lines/Drains/Airways Status   Active Line/Drains/Airways    Name:   Placement date:   Placement time:   Site:   Days:   Peripheral IV 03/14/18 Right Forearm   03/14/18    1854    Forearm   less than 1          Labs/Imaging Results for orders placed or performed during the hospital encounter of 03/14/18 (from the past 48 hour(s))  Comprehensive metabolic panel     Status: Abnormal   Collection Time: 03/14/18  6:49 PM  Result Value Ref Range   Sodium 134 (L) 135 - 145 mmol/L   Potassium 3.6 3.5 - 5.1 mmol/L   Chloride 98 98 - 111 mmol/L    Comment: Please note change in reference range.   CO2 8 (L) 22 - 32 mmol/L   Glucose, Bld 118 (H) 70 - 99 mg/dL    Comment: Please note change in reference range.  BUN <5 (L) 6 - 20 mg/dL    Comment: Please note change in reference range.   Creatinine, Ser 0.80 0.61 - 1.24 mg/dL   Calcium 9.2 8.9 - 10.3 mg/dL   Total Protein 7.4 6.5 - 8.1 g/dL   Albumin 2.9 (L) 3.5 - 5.0 g/dL   AST 64 (H) 15 - 41 U/L   ALT 34 0 - 44 U/L    Comment: Please note change in reference range.   Alkaline Phosphatase 167 (H) 38 - 126 U/L   Total Bilirubin 1.7 (H) 0.3 - 1.2 mg/dL   GFR calc non Af Amer >60 >60 mL/min   GFR calc Af Amer >60 >60 mL/min    Comment: (NOTE) The eGFR has been calculated using the CKD EPI equation. This calculation has not been validated in all clinical situations. eGFR's persistently <60 mL/min signify possible Chronic Kidney Disease.    Anion gap 28 (H) 5 - 15    Comment: Performed at [facility], [place] [address], [place], [place] [address]   Lipase, blood     Status: None   Collection Time: 03/14/18  6:49 PM  Result Value Ref Range   Lipase 27 11 - 51 U/L    Comment: Performed at [facility], [place] [address], [place], [place] [address]  CBG monitoring, ED     Status: Abnormal   Collection Time: 03/14/18  6:53 PM  Result Value Ref Range   Glucose-Capillary 126 (H) 70 - 99 mg/dL  I-stat troponin, ED     Status: None   Collection Time: 03/14/18  6:53 PM  Result Value Ref Range   Troponin i, poc 0.01 0.00 - 0.08 ng/mL   Comment 3            Comment: Due to the release kinetics of cTnI, a negative result within the first hours of the onset of symptoms does not rule out myocardial infarction with certainty. If myocardial infarction is still suspected, repeat the test at appropriate intervals.   I-Stat CG4 Lactic Acid, ED     Status: Abnormal   Collection Time: 03/14/18  6:55 PM  Result Value Ref Range   Lactic Acid, Venous >17.00 (HH) 0.5 - 1.9 mmol/L   Comment NOTIFIED PHYSICIAN   Type and screen [facility]     Status: None (Preliminary result)   Collection Time: 03/14/18  7:04 PM  Result Value Ref Range   ABO/RH(D) O POS    Antibody Screen POS    Sample Expiration 03/17/2018    Unit Number [id number]    Blood Component Type RED CELLS,LR    Unit division 00    Status of Unit ISSUED    Unit tag comment VERBAL ORDERS PER DR LONG PATRICK    Transfusion Status OK TO TRANSFUSE    Crossmatch Result PENDING    Unit Number [id number]    Blood Component Type RED CELLS,LR    Unit division 00    Status of Unit ISSUED    Unit tag comment VERBAL ORDERS PER DR LONG PATRICK    Transfusion Status      OK TO TRANSFUSE Performed at [place] [address] [place], [place] [address]    Crossmatch Result PENDING   Urinalysis, Routine w reflex microscopic     Status: Abnormal   Collection Time: 03/14/18  7:47 PM  Result Value Ref Range   Color, Urine YELLOW  YELLOW   APPearance CLEAR CLEAR  Specific Gravity, Urine 1.009 1.005 - 1.030   pH 5.0 5.0 - 8.0   Glucose, UA NEGATIVE NEGATIVE mg/dL   Hgb urine dipstick SMALL (A) NEGATIVE   Bilirubin Urine NEGATIVE NEGATIVE   Ketones, ur 20 (A) NEGATIVE mg/dL   Protein, ur NEGATIVE NEGATIVE mg/dL   Nitrite NEGATIVE NEGATIVE   Leukocytes, UA NEGATIVE NEGATIVE   RBC / HPF 0-5 0 - 5 RBC/hpf   WBC, UA 0-5 0 - 5 WBC/hpf   Bacteria, UA RARE (A) NONE SEEN   Squamous Epithelial / LPF 0-5 0 - 5   Mucus PRESENT    Granular Casts, UA PRESENT     Comment: Performed at [facility], [place] [address], [place], [place] [address]  Urine rapid drug screen (hosp performed)     Status: Abnormal   Collection Time: 03/14/18  7:47 PM  Result Value Ref Range   Opiates NONE DETECTED NONE DETECTED   Cocaine NONE DETECTED NONE DETECTED   Benzodiazepines NONE DETECTED NONE DETECTED   Amphetamines NONE DETECTED NONE DETECTED   Tetrahydrocannabinol POSITIVE (A) NONE DETECTED   Barbiturates (A) NONE DETECTED    Result not available. Reagent lot number recalled by manufacturer.    Comment: Performed at [facility], [place] [address] [place], [place] [address]  Troponin I     Status: None   Collection Time: 03/14/18  8:35 PM  Result Value Ref Range   Troponin I <0.03 <0.03 ng/mL    Comment: Performed at [facility], [place] [address], [place], [place] [address]  CBC with Differential/Platelet     Status: Abnormal (Preliminary result)   Collection Time: 03/14/18  8:35 PM  Result Value Ref Range   WBC PENDING 4.0 - 10.5 K/uL   RBC 1.42 (L) 4.22 - 5.81 MIL/uL   Hemoglobin 2.1 (LL) 13.0 - 17.0 g/dL    Comment: REPEATED TO VERIFY CRITICAL RESULT CALLED TO, READ BACK BY AND VERIFIED WITH: BRIAN,JESCEE AT 2125 ON 03/14/18 BY A,MOHAMED    HCT 9.0 (L) 39.0 - 52.0 %   MCV 63.4 (L) 78.0 - 100.0 fL   MCH 14.8 (L) 26.0 - 34.0 pg   MCHC 23.3 (L) 30.0 - 36.0 g/dL   RDW 25.3 (H)  11.5 - 15.5 %    Comment: Performed at [facility], [place] [address], [place],  [address]   Platelets PENDING 150 - 400 K/uL   Neutrophils Relative % PENDING %   Neutro Abs PENDING 1.7 - 7.7 K/uL   Band Neutrophils PENDING %   Lymphocytes Relative PENDING %   Lymphs Abs PENDING 0.7 - 4.0 K/uL   Monocytes Relative PENDING %   Monocytes Absolute PENDING 0.1 - 1.0 K/uL   Eosinophils Relative PENDING %   Eosinophils Absolute PENDING 0.0 - 0.7 K/uL   Basophils Relative PENDING %   Basophils Absolute PENDING 0.0 - 0.1 K/uL   WBC Morphology PENDING    RBC Morphology PENDING    Smear Review PENDING    nRBC PENDING 0 /100 WBC   Metamyelocytes Relative PENDING %   Myelocytes PENDING %   Promyelocytes Relative PENDING %   Blasts PENDING %  POC occult blood, ED Provider will collect     Status: None   Collection Time: 03/14/18  9:34 PM  Result Value Ref Range   Fecal Occult Bld NEGATIVE NEGATIVE   Dg Chest 2 View  Result Date: 03/14/2018 CLINICAL DATA:  Weakness for 3-4 days, fell today, history interstitial  lung disease, former smoker EXAM: CHEST - 2 VIEW COMPARISON:  09/11/2017 FINDINGS: Normal heart size, mediastinal contours, and pulmonary vascularity. Lungs hyperinflated. Improved perihilar infiltrates versus prior study. Minimal residual accentuation of LEFT perihilar markings versus RIGHT. No definite acute infiltrate, pleural effusion or pneumothorax. Osseous structures unremarkable. IMPRESSION: Hyperinflated lungs with improved pulmonary infiltrates since previous exam. Electronically Signed   By: Lavonia Dana M.D.   On: 03/14/2018 20:29   Ct Head Wo Contrast  Result Date: 03/14/2018 CLINICAL DATA:  Minor head trauma, fell today, weakness for 3-4 days, former smoker, history alcohol abuse EXAM: CT HEAD WITHOUT CONTRAST TECHNIQUE: Contiguous axial images were obtained from the base of the skull through the vertex without intravenous contrast. COMPARISON:  None  FINDINGS: Brain: Generalized atrophy. Normal ventricular morphology. No midline shift or mass effect. Small vessel chronic ischemic changes of deep cerebral white matter. No intracranial hemorrhage, mass lesion, evidence of acute infarction, or extra-axial fluid collection. Vascular: Atherosclerotic calcifications of internal carotid arteries at skull base Skull: Demineralized but intact Sinuses/Orbits: Significant osseous thickening at the frontal sinus and LEFT maxillary sinus question related to chronic sinusitis. Air-fluid level LEFT maxillary sinus. Remaining visualized paranasal sinuses and mastoid air cells clear. Other: N/A IMPRESSION: Atrophy with small vessel chronic ischemic changes of deep cerebral white matter. No acute intracranial abnormalities. Question LEFT maxillary sinusitis. Electronically Signed   By: Lavonia Dana M.D.   On: 03/14/2018 20:13    Pending Labs Unresulted Labs (From admission, onward)   Start     Ordered   03/15/18 0500  Comprehensive metabolic panel  Tomorrow morning,   R     03/14/18 2252   03/15/18 0500  CBC  Tomorrow morning,   R     03/14/18 2252   03/14/18 2253  Occult blood card to lab, stool  Once,   R     03/14/18 2252   03/14/18 2207  Prepare RBC  (Adult Blood Administration - PRBC)  Once,   R    Question Answer Comment  # of Units 1 unit   Transfusion Indications Symptomatic Anemia   If emergent release call blood bank [facility] [phone]      03/14/18 2207   03/14/18 2145  Prepare RBC  (Adult Blood Administration - PRBC)  Once,   R    Question Answer Comment  # of Units 4 units   Transfusion Indications Symptomatic Anemia   If emergent release call blood bank Not emergent release      03/14/18 2144   03/14/18 2035  CBC with Differential  STAT,   STAT     03/14/18 2034   03/14/18 1904  Culture, blood (routine x 2)  BLOOD CULTURE X 2,   STAT     03/14/18 1903   03/14/18 1902  Lactic acid, plasma  Now then every 2 hours,   STAT      03/14/18 1901   03/14/18 1849  CBC with Differential  Once,   STAT     03/14/18 1849   03/14/18 1849  Ethanol  Once,   STAT     03/14/18 1849   03/14/18 1849  Urine culture  STAT,   STAT    Question:  Patient immune status  Answer:  Normal   03/14/18 1849      Vitals/Pain Today's Vitals   03/14/18 2149 03/14/18 2202 03/14/18 2241 03/14/18 2256  BP: (!) 102/58 (!) 92/54 115/64 (!) 125/53  Pulse: 87 96 91 85  Resp: (!) 27 (!)  25 (!) 24 20  Temp:   97.8 F (36.6 C) (!) 91.3 F (32.9 C)  TempSrc:   Rectal Rectal  SpO2: 96% 100% 96% 99%  PainSc:        Isolation Precautions No active isolations  Medications Medications  0.9 %  sodium chloride infusion (has no administration in time range)  ondansetron (ZOFRAN) tablet 4 mg (has no administration in time range)    Or  ondansetron (ZOFRAN) injection 4 mg (has no administration in time range)  famotidine (PEPCID) IVPB 20 mg premix (has no administration in time range)  nicotine (NICODERM CQ - dosed in mg/24 hours) patch 14 mg (has no administration in time range)  0.9 %  sodium chloride infusion (has no administration in time range)  0.9 %  sodium chloride infusion (10 mL/hr Intravenous New Bag/Given 03/14/18 2200)  diphenhydrAMINE (BENADRYL) injection 25 mg (25 mg Intravenous Given 03/14/18 2229)  methylPREDNISolone sodium succinate (SOLU-MEDROL) 125 mg/2 mL injection 125 mg (125 mg Intravenous Given 03/14/18 2231)    Mobility walks

## 2018-03-14 NOTE — ED Notes (Signed)
Date and time results received: 03/14/18 2134 (use smartphrase ".now" to insert current time)  Test: HGB Critical Value: 2.1  Name of Provider Notified: Long  Orders Received? Or Actions Taken?: Orders Received - See Orders for details and none

## 2018-03-14 NOTE — H&P (Signed)
History and Physical    James [name] QQI:[id number] DOB: November 12, 1955 DOA: 03/14/2018  Referring MD/NP/PA: Dr. Laverta Baltimore  PCP: Denita Lung, MD  Outpatient Specialists: Dr. Benson Norway, gastroenterology   Patient coming from: Home  Chief Complaint: Dizziness and almost passing out  HPI: James [name] is a 62 y.o. male with medical history significant of recurrent severe anemia that required transfusion, alcohol abuse, recent small AVMs with chronic blood loss, severe cachexia who was last admitted in January with hemoglobin of about 2.  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  Patient was prescribed iron therapy after discharge but he refused to take them.  He was brought in today by the wife due to progressive weakness as well as near syncope.  Patient was fully awake and alert on arrival.  He was found to have a hemoglobin of 2.1.  He has significant antibodies and was waiting for blood transfusion in the ER when he became more agitated and confused.  Patient at this point is unable to give adequate history.  His stool was found to be brown with guaiac negative.  Family reported no bright red blood per rectum and no melena.  ED Course: Patient is severely cachectic and now confused not able to give history.  His temperature was 91.2 at some point blood pressure 92/54 pulse 105 respiratory rate of 34 and oxygen sat 99% room air.  White count is 43,000 hemoglobin 2.1 with hematocrit of 9.0, platelets 111.  Sodium is 134 potassium 3.6 with chloride 98.  His CO2 is 8 with BUN of 5.  Glucose 118 lactic acid 17.8.  Due to patient's sudden altered mental status rapid release of 2 units packed red blood cells were done despite his history of antibodies.  He received pretreatment with Benadryl and steroids in the ER.  Review of Systems: As per HPI otherwise 10 point review of systems negative.    Past Medical History:    Diagnosis Date  . Alcohol abuse   . Anemia due to GI blood loss 09/2011   microcytic.  transfused for Hgb 3.5, MCV in 50s.   . Aortic insufficiency   . Benign neoplasm of colon   . Black stools 08/21/2015  . Cachexia ([place])   . Chest pain 08/21/2015  . Dysphagia   . ED (erectile dysfunction)   . Edema   . GERD (gastroesophageal reflux disease)   . ILD (interstitial lung disease) ([place])   . Iron deficiency anemia, unspecified   . Pulmonary nodule   . Reflux esophagitis   . Smoker   . Tobacco abuse   . Weight loss, abnormal     Past Surgical History:  Procedure Laterality Date  . COLONOSCOPY  10/03/2011   Procedure: COLONOSCOPY;  Surgeon: Scarlette Shorts, MD;  Location: [place];  Service: Endoscopy;  Laterality: N/A;  . COLONOSCOPY N/A 09/13/2017   Procedure: COLONOSCOPY;  Surgeon: Carol Ada, MD;  Location: [place];  Service: Endoscopy;  Laterality: N/A;  . COLONOSCOPY WITH PROPOFOL N/A 08/22/2015   Procedure: COLONOSCOPY WITH PROPOFOL;  Surgeon: Wilford Corner, MD;  Location: [facility] ENDOSCOPY;  Service: Endoscopy;  Laterality: N/A;  . ENTEROSCOPY N/A 09/13/2017   Procedure: ENTEROSCOPY;  Surgeon: Carol Ada, MD;  Location: [facility] ENDOSCOPY;  Service: Endoscopy;  Laterality: N/A;  . ESOPHAGOGASTRODUODENOSCOPY  10/02/2011   Procedure: ESOPHAGOGASTRODUODENOSCOPY (EGD);  Surgeon: Scarlette Shorts, MD;  Location: [place];  Service: Endoscopy;  Laterality: N/A;  . ESOPHAGOGASTRODUODENOSCOPY N/A 01/05/2015   Procedure: ESOPHAGOGASTRODUODENOSCOPY (EGD);  Surgeon: Inda Castle, MD;  Location: [place];  Service: Endoscopy;  Laterality: N/A;  . ESOPHAGOGASTRODUODENOSCOPY (EGD) WITH PROPOFOL N/A 08/22/2015   Procedure: ESOPHAGOGASTRODUODENOSCOPY (EGD) WITH PROPOFOL;  Surgeon: Wilford Corner, MD;  Location: [facility] ENDOSCOPY;  Service: Endoscopy;  Laterality: N/A;  . ESOPHAGOGASTRODUODENOSCOPY (EGD) WITH PROPOFOL N/A 09/19/2016   Procedure: ESOPHAGOGASTRODUODENOSCOPY (EGD) WITH PROPOFOL;   Surgeon: Carol Ada, MD;  Location: [facility] ENDOSCOPY;  Service: Endoscopy;  Laterality: N/A;  . GIVENS CAPSULE STUDY N/A 09/19/2016   Procedure: GIVENS CAPSULE STUDY;  Surgeon: Carol Ada, MD;  Location: [place];  Service: Endoscopy;  Laterality: N/A;  . GIVENS CAPSULE STUDY N/A 09/11/2017   Procedure: GIVENS CAPSULE STUDY;  Surgeon: Carol Ada, MD;  Location: [place];  Service: Endoscopy;  Laterality: N/A;     reports that he has quit smoking. His smoking use included cigarettes. He has a 20.00 pack-year smoking history. He has never used smokeless tobacco. He reports that he drinks about 2.4 oz of alcohol per week. He reports that he has current or past drug history. Drug: Marijuana.  No Known Allergies  Family History  Problem Relation Age of Onset  . Hypertension Mother   . Diabetes Maternal Grandfather     Prior to Admission medications   Medication Sig Start Date End Date Taking? Authorizing Provider  azithromycin (ZITHROMAX) 250 MG tablet Take 1 tablet (250 mg total) by mouth daily. Patient not taking: Reported on 03/14/2018 09/14/17   Edwin Dada, MD  cefpodoxime (VANTIN) 200 MG tablet Take 1 tablet (200 mg total) by mouth 2 (two) times daily. Patient not taking: Reported on 03/14/2018 09/14/17   Edwin Dada, MD  docusate sodium (COLACE) 100 MG capsule Take 1 capsule (100 mg total) by mouth 2 (two) times daily. Patient not taking: Reported on 03/14/2018 09/14/17   Edwin Dada, MD  ferrous sulfate 325 (65 FE) MG tablet Take 1 tablet (325 mg total) by mouth 3 (three) times daily with meals. Patient not taking: Reported on 03/14/2018 09/14/17   Edwin Dada, MD  folic acid (FOLVITE) 1 MG tablet Take 1 tablet (1 mg total) by mouth daily. Patient not taking: Reported on 09/09/2017 09/22/16   Jonetta Osgood, MD  nicotine (NICODERM CQ - DOSED IN MG/24 HOURS) 21 mg/24hr patch Place 1 patch (21 mg total) onto the skin daily. Patient not taking:  Reported on 09/09/2017 03/04/17   Jani Gravel, MD  pantoprazole (PROTONIX) 40 MG tablet Take 1 tablet (40 mg total) by mouth daily at 6 (six) AM. Patient not taking: Reported on 03/14/2018 09/22/16   Jonetta Osgood, MD  thiamine 100 MG tablet Take 1 tablet (100 mg total) by mouth daily. Patient not taking: Reported on 09/09/2017 09/22/16   Jonetta Osgood, MD    Physical Exam: Vitals:   03/14/18 1833 03/14/18 2149  BP: 126/79 (!) 102/58  Pulse: (!) 105 87  Resp: (!) 28 (!) 27  Temp: (!) 97 F (36.1 C)   TempSrc: Oral   SpO2: 100% 96%      Constitutional: NAD, calm, comfortable Vitals:   03/14/18 1833 03/14/18 2149  BP: 126/79 (!) 102/58  Pulse: (!) 105 87  Resp: (!) 28 (!) 27  Temp: (!) 97 F (36.1 C)   TempSrc: Oral   SpO2: 100% 96%   Severely cachectic man, appears jaundiced, confused Eyes: PERRL, lids and conjunctivae normal ENMT: Mucous membranes  are moist. Posterior pharynx clear of any exudate or lesions.Normal dentition.  Neck: normal, supple, no masses, no thyromegaly Respiratory: clear to auscultation bilaterally, no wheezing, no crackles. Normal respiratory effort. No accessory muscle use.  Cardiovascular: Regular rate and rhythm, no murmurs / rubs / gallops. No extremity edema. 2+ pedal pulses. No carotid bruits.  Abdomen: no tenderness, no masses palpated. No hepatosplenomegaly. Bowel sounds positive.  Musculoskeletal: Marked wasting of muscles no clubbing / cyanosis. No joint deformity upper and lower extremities. Good ROM, no contractures. Normal muscle tone.  Skin: no rashes, lesions, ulcers. No induration Neurologic: CN 2-12 grossly intact. Sensation intact, DTR normal. Strength 5/5 in all 4.  Psychiatric: Currently confused and not communicating well  Labs on Admission: I have personally reviewed following labs and imaging studies  CBC: Recent Labs  Lab 03/14/18 2035  WBC PENDING  NEUTROABS PENDING  HGB 2.1*  HCT 9.0*  MCV 63.4*  PLT PENDING    Basic Metabolic Panel: Recent Labs  Lab 03/14/18 1849  NA 134*  K 3.6  CL 98  CO2 8*  GLUCOSE 118*  BUN <5*  CREATININE 0.80  CALCIUM 9.2   GFR: CrCl cannot be calculated (Unknown ideal weight.). Liver Function Tests: Recent Labs  Lab 03/14/18 1849  AST 64*  ALT 34  ALKPHOS 167*  BILITOT 1.7*  PROT 7.4  ALBUMIN 2.9*   Recent Labs  Lab 03/14/18 1849  LIPASE 27   No results for input(s): AMMONIA in the last 168 hours. Coagulation Profile: No results for input(s): INR, PROTIME in the last 168 hours. Cardiac Enzymes: No results for input(s): CKTOTAL, CKMB, CKMBINDEX, TROPONINI in the last 168 hours. BNP (last 3 results) No results for input(s): PROBNP in the last 8760 hours. HbA1C: No results for input(s): HGBA1C in the last 72 hours. CBG: Recent Labs  Lab 03/14/18 1853  GLUCAP 126*   Lipid Profile: No results for input(s): CHOL, HDL, LDLCALC, TRIG, CHOLHDL, LDLDIRECT in the last 72 hours. Thyroid Function Tests: No results for input(s): TSH, T4TOTAL, FREET4, T3FREE, THYROIDAB in the last 72 hours. Anemia Panel: No results for input(s): VITAMINB12, FOLATE, FERRITIN, TIBC, IRON, RETICCTPCT in the last 72 hours. Urine analysis:    Component Value Date/Time   COLORURINE YELLOW 03/14/2018 1947   APPEARANCEUR CLEAR 03/14/2018 1947   LABSPEC 1.009 03/14/2018 1947   PHURINE 5.0 03/14/2018 1947   GLUCOSEU NEGATIVE 03/14/2018 1947   HGBUR SMALL (A) 03/14/2018 [place] NEGATIVE 03/14/2018 1947   KETONESUR 20 (A) 03/14/2018 [place] NEGATIVE 03/14/2018 1947   NITRITE NEGATIVE 03/14/2018 1947   LEUKOCYTESUR NEGATIVE 03/14/2018 1947   Sepsis Labs: @LABRCNTIP (procalcitonin:4,lacticidven:4) )No results found for this or any previous visit (from the past 240 hour(s)).   Radiological Exams on Admission: Dg Chest 2 View  Result Date: 03/14/2018 CLINICAL DATA:  Weakness for 3-4 days, fell today, history interstitial lung disease, former smoker  EXAM: CHEST - 2 VIEW COMPARISON:  09/11/2017 FINDINGS: Normal heart size, mediastinal contours, and pulmonary vascularity. Lungs hyperinflated. Improved perihilar infiltrates versus prior study. Minimal residual accentuation of LEFT perihilar markings versus RIGHT. No definite acute infiltrate, pleural effusion or pneumothorax. Osseous structures unremarkable. IMPRESSION: Hyperinflated lungs with improved pulmonary infiltrates since previous exam. Electronically Signed   By: Lavonia Dana M.D.   On: 03/14/2018 20:29   Ct Head Wo Contrast  Result Date: 03/14/2018 CLINICAL DATA:  Minor head trauma, fell today, weakness for 3-4 days, former smoker, history alcohol abuse EXAM: CT HEAD WITHOUT CONTRAST TECHNIQUE:  Contiguous axial images were obtained from the base of the skull through the vertex without intravenous contrast. COMPARISON:  None FINDINGS: Brain: Generalized atrophy. Normal ventricular morphology. No midline shift or mass effect. Small vessel chronic ischemic changes of deep cerebral white matter. No intracranial hemorrhage, mass lesion, evidence of acute infarction, or extra-axial fluid collection. Vascular: Atherosclerotic calcifications of internal carotid arteries at skull base Skull: Demineralized but intact Sinuses/Orbits: Significant osseous thickening at the frontal sinus and LEFT maxillary sinus question related to chronic sinusitis. Air-fluid level LEFT maxillary sinus. Remaining visualized paranasal sinuses and mastoid air cells clear. Other: N/A IMPRESSION: Atrophy with small vessel chronic ischemic changes of deep cerebral white matter. No acute intracranial abnormalities. Question LEFT maxillary sinusitis. Electronically Signed   By: Lavonia Dana M.D.   On: 03/14/2018 20:13     Assessment/Plan Principal Problem:   Symptomatic Anemia  Active Problems:   Tobacco abuse   Alcohol abuse   ILD (interstitial lung disease) (HCC)   Cachexia (HCC)   Iron deficiency anemia due to chronic blood  loss   Symptomatic anemia   Lactic acidosis    #1 severe symptomatic anemia: Hemoglobin is 2.1.  He had a similar episode back in January.  Patient is concerned his multiple symptoms including neurologic at the moment.  He will need urgent transfusion of packed red blood cells.  Admit to stepdown unit.  With his symptoms we will use emergency on March blood with pretreatment.  Transfuse up to 45 units of packed red blood cells ultimately.  He has had multiple GI work-up.  Recheck his stool guaiacs.  His problem seems to be noncompliance with iron therapy.  Also ongoing alcohol abuse with possible recurrent GI bleed.  IV Protonix will be initiated.  #2 altered mental status: Combination of poor brain perfusion probably from severe anemia as well as alcoholism.  Will check ammonia level while continuing with blood transfusion as indicated.  Sepsis is possible with his white count of more than 43,000.  Initiate empiric antibiotics.  #3 severe leukocytosis: Could be reactive secondary to severe anemia.  Not febrile but we will empirically treat for early sepsis especially with lactic acidosis.  #4 lactic acidosis: Could be related to severe anemia or alcoholism.  No obvious evidence of sepsis.  Hydrate patient transfuse and monitor lactic acid level.  #5 severe cachexia: Patient appears to have severe protein caloric malnutrition.  Nutrition counseling will be ordered.  #6 alcohol abuse: We will initiate thiamine and folic acid.  Monitor patient for possible alcohol withdrawal. His last drink was yesterday.  CIWA protocol will be initiated with early signs.  #7 tobacco abuse: Initiate tobacco cessation counseling was nicotine patch  #8 hypotension: Blood pressure is slightly low.  We will give IV normal saline until blood transfusion is started.   DVT prophylaxis: Heparin  Code Status: Full code  Family Communication: Wife and sister at bedside  Disposition Plan: To be determined  Consults  called: None  Admission status: Inpatient to stepdown unit  Severity of Illness: The appropriate patient status for this patient is INPATIENT. Inpatient status is judged to be reasonable and necessary in order to provide the required intensity of service to ensure the patient's safety. The patient's presenting symptoms, physical exam findings, and initial radiographic and laboratory data in the context of their chronic comorbidities is felt to place them at high risk for further clinical deterioration. Furthermore, it is not anticipated that the patient will be medically stable for discharge from the  hospital within 2 midnights of admission. The following factors support the patient status of inpatient.   " The patient's presenting symptoms include presyncope. " The worrisome physical exam findings include severe cachexia. " The initial radiographic and laboratory data are worrisome because of hemoglobin of 2.1. " The chronic co-morbidities include alcoholism with chronic iron deficiency.   * I certify that at the point of admission it is my clinical judgment that the patient will require inpatient hospital care spanning beyond 2 midnights from the point of admission due to high intensity of service, high risk for further deterioration and high frequency of surveillance required.Barbette Merino MD [facility] Pager [phone]  If 7PM-7AM, please contact night-coverage [email or web address] Password [email or web address]  03/14/2018, 9:55 PM

## 2018-03-14 NOTE — ED Provider Notes (Signed)
Emergency Department Provider Note   I have reviewed the triage vital signs and the nursing notes.   HISTORY  Chief Complaint No chief complaint on file.   HPI James [name] is a 62 y.o. 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  The patient states he has had progressively worsening weakness over the past 1 to 2 weeks.  He denies any fevers or chills.  No chest pain or dyspnea.  Today he stood up to go to the bathroom when he felt lightheaded and fell backwards striking the back of his head.  He is unclear about whether or not he lost consciousness.  He does note a history of anemia and requiring blood transfusions in the past.  Denies any bright red blood or black bowel movements.  No vomiting blood.    Past Medical History:  Diagnosis Date  . Alcohol abuse   . Anemia due to GI blood loss 09/2011   microcytic.  transfused for Hgb 3.5, MCV in 50s.   . Aortic insufficiency   . Benign neoplasm of colon   . Black stools 08/21/2015  . Cachexia ([place])   . Chest pain 08/21/2015  . Dysphagia   . ED (erectile dysfunction)   . Edema   . GERD (gastroesophageal reflux disease)   . ILD (interstitial lung disease) ([place])   . Iron deficiency anemia, unspecified   . Pulmonary nodule   . Reflux esophagitis   . Smoker   . Tobacco abuse   . Weight loss, abnormal     Patient Active Problem List   Diagnosis Date Noted  . Anemia 09/10/2017  . Acute respiratory failure with hypoxia ([place])   . Hypokalemia 03/02/2017  . Symptomatic anemia 03/02/2017  . Hyponatremia 03/02/2017  . Right hip pain 03/02/2017  . Lactic acidosis   . History of peptic ulcer disease 10/06/2016  . Poor dentition 10/06/2016  . Iron deficiency anemia due to chronic blood loss 09/17/2016  . Alcoholic gastritis with hemorrhage   .  Marijuana abuse   . Severe protein-calorie malnutrition ([place]) 01/02/2015  . ILD (interstitial lung disease) ([place]) 10/24/2011  . Pulmonary nodule 10/24/2011  . Cachexia ([place]) 10/24/2011  . Benign neoplasm of colon 10/03/2011  . Symptomatic Anemia  10/01/2011  . Tobacco abuse 10/01/2011  . Alcohol abuse 10/01/2011  . Dysphagia, unspecified(787.20) 10/01/2011    Past Surgical History:  Procedure Laterality Date  . COLONOSCOPY  10/03/2011   Procedure: COLONOSCOPY;  Surgeon: Scarlette Shorts, MD;  Location: [place] [facility];  Service: Endoscopy;  Laterality: N/A;  . COLONOSCOPY N/A 09/13/2017   Procedure: COLONOSCOPY;  Surgeon: Carol Ada, MD;  Location: [facility];  Service: Endoscopy;  Laterality: N/A;  . COLONOSCOPY WITH PROPOFOL N/A 08/22/2015   Procedure: COLONOSCOPY WITH PROPOFOL;  Surgeon: Wilford Corner, MD;  Location: [facility] ENDOSCOPY;  Service: Endoscopy;  Laterality: N/A;  . ENTEROSCOPY N/A 09/13/2017   Procedure: ENTEROSCOPY;  Surgeon: Carol Ada, MD;  Location: [facility] ENDOSCOPY;  Service: Endoscopy;  Laterality: N/A;  . ESOPHAGOGASTRODUODENOSCOPY  10/02/2011   Procedure: ESOPHAGOGASTRODUODENOSCOPY (EGD);  Surgeon: Scarlette Shorts, MD;  Location: [facility] ENDOSCOPY;  Service: Endoscopy;  Laterality: N/A;  . ESOPHAGOGASTRODUODENOSCOPY N/A 01/05/2015   Procedure: ESOPHAGOGASTRODUODENOSCOPY (EGD);  Surgeon: Inda Castle, MD;  Location: [place];  Service: Endoscopy;  Laterality: N/A;  . ESOPHAGOGASTRODUODENOSCOPY (EGD) WITH PROPOFOL N/A 08/22/2015  Procedure: ESOPHAGOGASTRODUODENOSCOPY (EGD) WITH PROPOFOL;  Surgeon: Wilford Corner, MD;  Location: [facility] ENDOSCOPY;  Service: Endoscopy;  Laterality: N/A;  . ESOPHAGOGASTRODUODENOSCOPY (EGD) WITH PROPOFOL N/A 09/19/2016   Procedure: ESOPHAGOGASTRODUODENOSCOPY (EGD) WITH PROPOFOL;  Surgeon: Carol Ada, MD;  Location: [facility] ENDOSCOPY;  Service: Endoscopy;  Laterality: N/A;  . GIVENS CAPSULE STUDY N/A 09/19/2016   Procedure: GIVENS CAPSULE STUDY;  Surgeon:  Carol Ada, MD;  Location: [place];  Service: Endoscopy;  Laterality: N/A;  . GIVENS CAPSULE STUDY N/A 09/11/2017   Procedure: GIVENS CAPSULE STUDY;  Surgeon: Carol Ada, MD;  Location: [place];  Service: Endoscopy;  Laterality: N/A;   Allergies Patient has no known allergies.  Family History  Problem Relation Age of Onset  . Hypertension Mother   . Diabetes Maternal Grandfather     Social History Social History   Tobacco Use  . Smoking status: Former Smoker    Packs/day: 1.00    Years: 20.00    Pack years: 20.00    Types: Cigarettes  . Smokeless tobacco: Never Used  . Tobacco comment: pt states he is down to 7 cigs per day  Substance Use Topics  . Alcohol use: Yes    Alcohol/week: 2.4 oz    Types: 4 Cans of beer per week  . Drug use: Yes    Types: Marijuana    Review of Systems  Constitutional: No fever/chills. Positive generalized weakness and fall.  Eyes: No visual changes. ENT: No sore throat. Cardiovascular: Denies chest pain. Respiratory: Denies shortness of breath. Gastrointestinal: No abdominal pain.  No nausea, no vomiting.  No diarrhea.  No constipation. Genitourinary: Negative for dysuria. Musculoskeletal: Negative for back pain. Skin: Negative for rash. Neurological: Negative for focal weakness or numbness. Positive HA.   10-point ROS otherwise negative.  ____________________________________________   PHYSICAL EXAM:  VITAL SIGNS: ED Triage Vitals  Enc Vitals Group     BP 03/14/18 1833 126/79     Pulse Rate 03/14/18 1833 (!) 105     Resp 03/14/18 1833 (!) 28     Temp 03/14/18 1833 (!) 97 F (36.1 C)     Temp Source 03/14/18 1833 Oral     SpO2 03/14/18 1833 100 %     Pain Score 03/14/18 1831 0   Constitutional: Alert and oriented. Appears generally weak and cachectic.  Eyes: Conjunctivae are pale. PERRL. EOMI. Head: Atraumatic. Nose: No congestion/rhinnorhea. Mouth/Throat: Mucous membranes are moist.  Oropharynx  non-erythematous. Neck: No stridor.   Cardiovascular: Tachcyardia. Good peripheral circulation. Grossly normal heart sounds.   Respiratory: Normal respiratory effort.  No retractions. Lungs CTAB. Gastrointestinal: Soft and nontender. No distention.  Musculoskeletal: No lower extremity tenderness nor edema. No gross deformities of extremities. Neurologic:  Normal speech and language. No gross focal neurologic deficits are appreciated.  Skin:  Skin is warm, dry and intact. No rash noted.  ____________________________________________   LABS (all labs ordered are listed, but only abnormal results are displayed)  Labs Reviewed  URINALYSIS, ROUTINE W REFLEX MICROSCOPIC - Abnormal; Notable for the following components:      Result Value   Hgb urine dipstick SMALL (*)    Ketones, ur 20 (*)    Bacteria, UA RARE (*)    All other components within normal limits  COMPREHENSIVE METABOLIC PANEL - Abnormal; Notable for the following components:   Sodium 134 (*)    CO2 8 (*)    Glucose, Bld 118 (*)    BUN <5 (*)    Albumin 2.9 (*)  AST 64 (*)    Alkaline Phosphatase 167 (*)    Total Bilirubin 1.7 (*)    Anion gap 28 (*)    All other components within normal limits  RAPID URINE DRUG SCREEN, HOSP PERFORMED - Abnormal; Notable for the following components:   Tetrahydrocannabinol POSITIVE (*)    Barbiturates   (*)    Value: Result not available. Reagent lot number recalled by manufacturer.   All other components within normal limits  CBC WITH DIFFERENTIAL/PLATELET - Abnormal; Notable for the following components:   RBC 1.42 (*)    Hemoglobin 2.1 (*)    HCT 9.0 (*)    MCV 63.4 (*)    MCH 14.8 (*)    MCHC 23.3 (*)    RDW 25.3 (*)    All other components within normal limits  CBG MONITORING, ED - Abnormal; Notable for the following components:   Glucose-Capillary 126 (*)    All other components within normal limits  I-STAT CG4 LACTIC ACID, ED - Abnormal; Notable for the following  components:   Lactic Acid, Venous >17.00 (*)    All other components within normal limits  URINE CULTURE  CULTURE, BLOOD (ROUTINE X 2)  CULTURE, BLOOD (ROUTINE X 2)  LIPASE, BLOOD  TROPONIN I  CBC WITH DIFFERENTIAL/PLATELET  ETHANOL  LACTIC ACID, PLASMA  CBC WITH DIFFERENTIAL/PLATELET  COMPREHENSIVE METABOLIC PANEL  CBC  OCCULT BLOOD X 1 CARD TO LAB, STOOL  I-STAT TROPONIN, ED  POC OCCULT BLOOD, ED  TYPE AND SCREEN  PREPARE RBC (CROSSMATCH)  PREPARE RBC (CROSSMATCH)   ____________________________________________  EKG   EKG Interpretation  Date/Time:  Sunday March 14 2018 18:42:36 EDT Ventricular Rate:  104 PR Interval:    QRS Duration: 94 QT Interval:  377 QTC Calculation: 496 R Axis:   71 Text Interpretation:  Sinus tachycardia Atrial premature complex Minimal ST depression, diffuse leads Borderline prolonged QT interval NO STEMI.  Confirmed by Nanda Quinton [phone]) on 03/14/2018 7:02:53 PM       ____________________________________________  RADIOLOGY  Dg Chest 2 View  Result Date: 03/14/2018 CLINICAL DATA:  Weakness for 3-4 days, fell today, history interstitial lung disease, former smoker EXAM: CHEST - 2 VIEW COMPARISON:  09/11/2017 FINDINGS: Normal heart size, mediastinal contours, and pulmonary vascularity. Lungs hyperinflated. Improved perihilar infiltrates versus prior study. Minimal residual accentuation of LEFT perihilar markings versus RIGHT. No definite acute infiltrate, pleural effusion or pneumothorax. Osseous structures unremarkable. IMPRESSION: Hyperinflated lungs with improved pulmonary infiltrates since previous exam. Electronically Signed   By: Lavonia Dana M.D.   On: 03/14/2018 20:29   Ct Head Wo Contrast  Result Date: 03/14/2018 CLINICAL DATA:  Altered level of consciousness, seizure activity 1 hour ago EXAM: CT HEAD WITHOUT CONTRAST TECHNIQUE: Contiguous axial images were obtained from the base of the skull through the vertex without intravenous  contrast. Sagittal and coronal MPR images reconstructed from axial data set. COMPARISON:  Earlier study of 03/14/2018 FINDINGS: Brain: Generalized atrophy. Normal ventricular morphology. No midline shift or mass effect. Minimal small vessel chronic ischemic changes of deep cerebral white matter. No intracranial hemorrhage, mass lesion, or evidence acute infarction. No extra-axial fluid collections. Vascular: Atherosclerotic calcification of internal carotid arteries at skull base Skull: Intact calvaria. Wall thickening of the frontal and LEFT maxillary sinuses likely related to chronic sinusitis. Sinuses/Orbits: Air-fluid level LEFT maxillary sinus question sinusitis. Remaining visualized paranasal sinuses and mastoid air cells clear Other: N/A IMPRESSION: Atrophy with minimal small vessel chronic ischemic changes of deep cerebral white matter. No  acute intracranial abnormalities. Question LEFT maxillary sinusitis. If patient has persistent or recurrent seizures, consider MR assessment. Electronically Signed   By: Lavonia Dana M.D.   On: 03/14/2018 23:26   Ct Head Wo Contrast  Result Date: 03/14/2018 CLINICAL DATA:  Minor head trauma, fell today, weakness for 3-4 days, former smoker, history alcohol abuse EXAM: CT HEAD WITHOUT CONTRAST TECHNIQUE: Contiguous axial images were obtained from the base of the skull through the vertex without intravenous contrast. COMPARISON:  None FINDINGS: Brain: Generalized atrophy. Normal ventricular morphology. No midline shift or mass effect. Small vessel chronic ischemic changes of deep cerebral white matter. No intracranial hemorrhage, mass lesion, evidence of acute infarction, or extra-axial fluid collection. Vascular: Atherosclerotic calcifications of internal carotid arteries at skull base Skull: Demineralized but intact Sinuses/Orbits: Significant osseous thickening at the frontal sinus and LEFT maxillary sinus question related to chronic sinusitis. Air-fluid level LEFT  maxillary sinus. Remaining visualized paranasal sinuses and mastoid air cells clear. Other: N/A IMPRESSION: Atrophy with small vessel chronic ischemic changes of deep cerebral white matter. No acute intracranial abnormalities. Question LEFT maxillary sinusitis. Electronically Signed   By: Lavonia Dana M.D.   On: 03/14/2018 20:13    ____________________________________________   PROCEDURES  Procedure(s) performed:   .Critical Care Performed by: Margette Fast, MD Authorized by: Margette Fast, MD   Critical care provider statement:    Critical care time (minutes):  45   Critical care time was exclusive of:  Separately billable procedures and treating other patients and teaching time   Critical care was necessary to treat or prevent imminent or life-threatening deterioration of the following conditions:  Circulatory failure   Critical care was time spent personally by me on the following activities:  Blood draw for specimens, development of treatment plan with patient or surrogate, evaluation of patient's response to treatment, examination of patient, obtaining history from patient or surrogate, ordering and performing treatments and interventions, ordering and review of laboratory studies, ordering and review of radiographic studies, pulse oximetry, re-evaluation of patient's condition and review of old charts   I assumed direction of critical care for this patient from another provider in my specialty: no      ____________________________________________   INITIAL IMPRESSION / [place] / ED COURSE  Pertinent labs & imaging results that were available during my care of the patient were reviewed by me and considered in my medical decision making (see chart for details).  Patient appears generally very weak.  No neuro deficits.  Has a history of significant anemia with admission in January of this year.  At that time he had a hemoglobin of 2.1.  Pill endoscopy showed small  enteric AVMs.  He was volume resuscitated with blood and PPI started at that time along with iron.  Initially ordered 1 L IV fluids with tachycardia but patient's blood with straight stick was very watery appearing.  I will hold IV fluids with my suspicion elevated for severe anemia.  Lactate from i-STAT reading greater than 17. Doubt sepsis but will obtain blood cultures.   Labs reviewed. CBC took a long time to result but found to have severe anemia with Hb of 2.1. WBC and platelets pending. Spoke with blood bank regarding this and patient has multiple antibodies making type and screen difficult. CT head negative. CXR improved from prior.   The patient is noted to have a lactate>4. With the current information available to me, I don't think the patient is in septic shock. The  lactate>4, is related to OTHER SHOCK acute anemia .   Discussed patient's case with Hospitalist, Dr. Jonelle Sidle to request admission. Patient and family (if present) updated with plan. Care transferred to Hospitalist service.  I reviewed all nursing notes, vitals, pertinent old records, EKGs, labs, imaging (as available).  10:08 PM Called to patient's bed with acutely worsening mental status. Patient is awake but not speaking clearly. No medication or blood given as of yet. Suspect anemia to the point where patient is perfusing poorly. Patient with known antibodies and type and screen process expected to be long. Up until now the patient's mental status was normal. Discussed with family at bedside the risks and benefits of emergency release blood. They understand that this could lead to a transfusion reaction but with acutely worsening mental status I feel that the benefit for blood outweighs the risk at this point. Will pre-treat with benadryl and solumedrol. Blood bank alerted and will get clearance from pathology for emergency release in patient with antibodies. Updated Dr. Jonelle Sidle who has evaluated the patient and ordered gentle IVF  pending blood transfusion. OK with 2U emergency release blood.  ____________________________________________  FINAL CLINICAL IMPRESSION(S) / ED DIAGNOSES  Final diagnoses:  Severe anemia    MEDICATIONS GIVEN DURING THIS VISIT:  Medications  0.9 %  sodium chloride infusion (has no administration in time range)  ondansetron (ZOFRAN) tablet 4 mg (has no administration in time range)    Or  ondansetron (ZOFRAN) injection 4 mg (has no administration in time range)  famotidine (PEPCID) IVPB 20 mg premix (has no administration in time range)  nicotine (NICODERM CQ - dosed in mg/24 hours) patch 14 mg (has no administration in time range)  0.9 %  sodium chloride infusion (has no administration in time range)  0.9 %  sodium chloride infusion (10 mL/hr Intravenous New Bag/Given 03/14/18 2200)  diphenhydrAMINE (BENADRYL) injection 25 mg (25 mg Intravenous Given 03/14/18 2229)  methylPREDNISolone sodium succinate (SOLU-MEDROL) 125 mg/2 mL injection 125 mg (125 mg Intravenous Given 03/14/18 2231)    Note:  This document was prepared using Dragon voice recognition software and may include unintentional dictation errors.  Nanda Quinton, MD Emergency Medicine    Long, Wonda [facility], MD 03/14/18 [phone]

## 2018-03-14 NOTE — Progress Notes (Signed)
Patient has proven to be a difficult arterial stick for a blood gas sample. Spoke with EDP. Okay to hold ABG for now. Order discontinued per RT protocol.

## 2018-03-14 NOTE — ED Triage Notes (Signed)
Pt is presented from home c/o weakness that he states he has been experiencing for the last 3-4 days, that may have led to a fall today. Suspicion LOC without head injury or impact, states that last time he experienced a similar episode, he needed a blood transfusion, chart review collaborates. He denies any pain

## 2018-03-15 DIAGNOSIS — D509 Iron deficiency anemia, unspecified: Secondary | ICD-10-CM

## 2018-03-15 DIAGNOSIS — D649 Anemia, unspecified: Secondary | ICD-10-CM

## 2018-03-15 DIAGNOSIS — R64 Cachexia: Secondary | ICD-10-CM

## 2018-03-15 DIAGNOSIS — E872 Acidosis: Secondary | ICD-10-CM

## 2018-03-15 LAB — CBC WITH DIFFERENTIAL/PLATELET
BASOS ABS: 0 10*3/uL (ref 0.0–0.1)
BASOS PCT: 0 %
Basophils Absolute: 0 10*3/uL (ref 0.0–0.1)
Basophils Relative: 0 %
EOS ABS: 0 10*3/uL (ref 0.0–0.7)
EOS PCT: 0 %
Eosinophils Absolute: 0 10*3/uL (ref 0.0–0.7)
Eosinophils Relative: 0 %
HCT: 9 % — ABNORMAL LOW (ref 39.0–52.0)
HCT: 29.8 % — ABNORMAL LOW (ref 39.0–52.0)
HEMOGLOBIN: 9.1 g/dL — AB (ref 13.0–17.0)
Hemoglobin: 2.1 g/dL — CL (ref 13.0–17.0)
LYMPHS ABS: 0.6 10*3/uL — AB (ref 0.7–4.0)
LYMPHS PCT: 2 %
Lymphocytes Relative: 3 %
Lymphs Abs: 1.3 10*3/uL (ref 0.7–4.0)
MCH: 14.8 pg — AB (ref 26.0–34.0)
MCH: 24.5 pg — AB (ref 26.0–34.0)
MCHC: 23.3 g/dL — AB (ref 30.0–36.0)
MCHC: 30.5 g/dL (ref 30.0–36.0)
MCV: 63.4 fL — ABNORMAL LOW (ref 78.0–100.0)
MCV: 80.3 fL (ref 78.0–100.0)
MONO ABS: 2.2 10*3/uL — AB (ref 0.1–1.0)
MONOS PCT: 2 %
Monocytes Absolute: 0.6 10*3/uL (ref 0.1–1.0)
Monocytes Relative: 5 %
NEUTROS ABS: 39.7 10*3/uL — AB (ref 1.7–7.7)
Neutro Abs: 27.9 10*3/uL — ABNORMAL HIGH (ref 1.7–7.7)
Neutrophils Relative %: 92 %
Neutrophils Relative %: 96 %
PLATELETS: 111 10*3/uL — AB (ref 150–400)
Platelets: 96 10*3/uL — ABNORMAL LOW (ref 150–400)
RBC: 1.42 MIL/uL — ABNORMAL LOW (ref 4.22–5.81)
RBC: 3.71 MIL/uL — AB (ref 4.22–5.81)
RDW: 25.3 % — AB (ref 11.5–15.5)
RDW: 26.5 % — AB (ref 11.5–15.5)
WBC: 43.2 10*3/uL — ABNORMAL HIGH (ref 4.0–10.5)
WBC: 29.1 10*3/uL — AB (ref 4.0–10.5)

## 2018-03-15 LAB — VITAMIN B12: VITAMIN B 12: 1337 pg/mL — AB (ref 180–914)

## 2018-03-15 LAB — IRON AND TIBC
IRON: 108 ug/dL (ref 45–182)
SATURATION RATIOS: 30 % (ref 17.9–39.5)
TIBC: 360 ug/dL (ref 250–450)
UIBC: 252 ug/dL

## 2018-03-15 LAB — RETICULOCYTES
RBC.: 3.75 MIL/uL — ABNORMAL LOW (ref 4.22–5.81)
Retic Count, Absolute: 108.8 10*3/uL (ref 19.0–186.0)
Retic Ct Pct: 2.9 % (ref 0.4–3.1)

## 2018-03-15 LAB — CBC
HEMATOCRIT: 33.6 % — AB (ref 39.0–52.0)
Hemoglobin: 11.1 g/dL — ABNORMAL LOW (ref 13.0–17.0)
MCH: 26.3 pg (ref 26.0–34.0)
MCHC: 33 g/dL (ref 30.0–36.0)
MCV: 79.6 fL (ref 78.0–100.0)
PLATELETS: 75 10*3/uL — AB (ref 150–400)
RBC: 4.22 MIL/uL (ref 4.22–5.81)
RDW: 23.4 % — AB (ref 11.5–15.5)
WBC: 28.3 10*3/uL — AB (ref 4.0–10.5)

## 2018-03-15 LAB — OCCULT BLOOD X 1 CARD TO LAB, STOOL: FECAL OCCULT BLD: NEGATIVE

## 2018-03-15 LAB — PREPARE RBC (CROSSMATCH)

## 2018-03-15 LAB — FOLATE: Folate: 7.7 ng/mL (ref >5.9)

## 2018-03-15 LAB — LACTIC ACID, PLASMA
LACTIC ACID, VENOUS: 17.8 mmol/L — AB (ref 0.5–1.9)
LACTIC ACID, VENOUS: 4 mmol/L — AB (ref 0.5–1.9)

## 2018-03-15 LAB — FERRITIN: Ferritin: 14 ng/mL — ABNORMAL LOW (ref 24–336)

## 2018-03-15 MED ORDER — VITAMIN B-1 100 MG PO TABS
100.0000 mg | ORAL_TABLET | Freq: Every day | ORAL | Status: DC
  Administered 2018-03-15: 100 mg via ORAL
  Filled 2018-03-15: qty 1

## 2018-03-15 MED ORDER — PANTOPRAZOLE SODIUM 40 MG PO TBEC
40.0000 mg | DELAYED_RELEASE_TABLET | Freq: Two times a day (BID) | ORAL | Status: DC
  Administered 2018-03-15 (×2): 40 mg via ORAL
  Filled 2018-03-15 (×2): qty 1

## 2018-03-15 MED ORDER — FOLIC ACID 1 MG PO TABS
1.0000 mg | ORAL_TABLET | Freq: Every day | ORAL | Status: DC
  Administered 2018-03-15 – 2018-03-18 (×2): 1 mg via ORAL
  Filled 2018-03-15 (×2): qty 1

## 2018-03-15 MED ORDER — THIAMINE HCL 100 MG/ML IJ SOLN
100.0000 mg | Freq: Every day | INTRAMUSCULAR | Status: DC
  Administered 2018-03-16 – 2018-03-19 (×4): 100 mg via INTRAVENOUS
  Filled 2018-03-15 (×3): qty 2

## 2018-03-15 MED ORDER — BOOST / RESOURCE BREEZE PO LIQD CUSTOM
1.0000 | Freq: Three times a day (TID) | ORAL | Status: DC
  Administered 2018-03-15 (×2): 1 via ORAL

## 2018-03-15 MED ORDER — SODIUM CHLORIDE 0.9 % IV SOLN
510.0000 mg | Freq: Once | INTRAVENOUS | Status: AC
  Administered 2018-03-15: 510 mg via INTRAVENOUS
  Filled 2018-03-15: qty 17

## 2018-03-15 MED ORDER — LORAZEPAM 1 MG PO TABS: 1.0000 mg | ORAL_TABLET | Freq: Four times a day (QID) | ORAL | Status: AC | PRN

## 2018-03-15 MED ORDER — FAMOTIDINE 20 MG PO TABS
20.0000 mg | ORAL_TABLET | Freq: Two times a day (BID) | ORAL | Status: DC
  Administered 2018-03-15: 20 mg via ORAL
  Filled 2018-03-15: qty 1

## 2018-03-15 MED ORDER — ENSURE ENLIVE PO LIQD
237.0000 mL | Freq: Two times a day (BID) | ORAL | Status: DC
  Administered 2018-03-15: 15:00:00 via ORAL
  Administered 2018-03-15 – 2018-03-17 (×2): 237 mL via ORAL

## 2018-03-15 MED ORDER — LORAZEPAM 2 MG/ML IJ SOLN
1.0000 mg | Freq: Four times a day (QID) | INTRAMUSCULAR | Status: AC | PRN
  Administered 2018-03-15 – 2018-03-17 (×4): 1 mg via INTRAVENOUS
  Filled 2018-03-15 (×5): qty 1

## 2018-03-15 MED ORDER — SODIUM CHLORIDE 0.9% IV SOLUTION
Freq: Once | INTRAVENOUS | Status: AC
  Administered 2018-03-15: 11:00:00 via INTRAVENOUS

## 2018-03-15 MED ORDER — BOOST / RESOURCE BREEZE PO LIQD CUSTOM
1.0000 | Freq: Three times a day (TID) | ORAL | Status: DC
Start: 2018-03-15 — End: 2018-03-15
  Administered 2018-03-15: 1 via ORAL

## 2018-03-15 MED ORDER — FERROUS SULFATE 325 (65 FE) MG PO TABS
325.0000 mg | ORAL_TABLET | Freq: Three times a day (TID) | ORAL | Status: DC
  Administered 2018-03-15 – 2018-03-18 (×3): 325 mg via ORAL
  Filled 2018-03-15 (×2): qty 1

## 2018-03-15 MED ORDER — SODIUM CHLORIDE 0.9 % IV SOLN
2.0000 g | Freq: Once | INTRAVENOUS | Status: AC
  Administered 2018-03-15: 2 g via INTRAVENOUS
  Filled 2018-03-15: qty 2

## 2018-03-15 MED ORDER — CEFEPIME HCL 1 G IJ SOLR
1.0000 g | Freq: Three times a day (TID) | INTRAMUSCULAR | Status: DC
Start: 2018-03-15 — End: 2018-03-17
  Administered 2018-03-15 – 2018-03-17 (×7): 1 g via INTRAVENOUS
  Filled 2018-03-15 (×9): qty 1

## 2018-03-15 MED ORDER — ADULT MULTIVITAMIN W/MINERALS CH
1.0000 | ORAL_TABLET | Freq: Every day | ORAL | Status: DC
  Administered 2018-03-15 – 2018-03-18 (×2): 1 via ORAL
  Filled 2018-03-15 (×2): qty 1

## 2018-03-15 NOTE — Progress Notes (Signed)
PHARMACIST - PHYSICIAN COMMUNICATION  DR:   Venetia Constable  CONCERNING: IV to Oral Route Change Policy  RECOMMENDATION: This patient is receiving famotidine by the intravenous route.  Based on criteria approved by the Pharmacy and Therapeutics Committee, the intravenous medication(s) is/are being converted to the equivalent oral dose form(s).   DESCRIPTION: These criteria include:  The patient is eating (either orally or via tube) and/or has been taking other orally administered medications for a least 24 hours  The patient has no evidence of active gastrointestinal bleeding or impaired GI absorption (gastrectomy, short bowel, patient on TNA or NPO).  If you have questions about this conversion, please contact the [place], [facility] 03/15/2018 9:16 AM

## 2018-03-15 NOTE — Progress Notes (Signed)
Initial Nutrition Assessment  DOCUMENTATION CODES:   Severe malnutrition in context of chronic illness, Underweight  INTERVENTION:   Monitor magnesium, potassium, and phosphorus daily for at least 3 days, MD to replete as needed, as pt is at risk for refeeding syndrome given severe malnutrition, ETOH abuse.  -Continue Ensure Enlive po BID, each supplement provides 350 kcal and 20 grams of protein -Continue Boost Breeze po TID with meals, each supplement provides 250 kcal and 9 grams of protein  NUTRITION DIAGNOSIS:   Severe Malnutrition related to chronic illness(ETOH abuse) as evidenced by energy intake < or equal to 75% for > or equal to 1 month, severe fat depletion, severe muscle depletion.  GOAL:   Patient will meet greater than or equal to 90% of their needs  MONITOR:   PO intake, Supplement acceptance, Weight trends, Labs, I & O's  REASON FOR ASSESSMENT:   Consult Assessment of nutrition requirement/status  ASSESSMENT:   62 y.o. 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  He was brought in because of progressive weakness near syncope and confusion  Patient reporting feels hungry. Pt last drank ETOH 7/6, tends to drink more than eat food. Pt has been ordered Boost Breeze supplements and Ensure. Will continue given increased needs and malnutrition status.  UBW is 97-110 lb. Pt within this range currently.   Medications: Ferrous sulfate tablet TID, Folic acid tablet daily, Multivitamin with minerals daily, Thiamine tablet daily  Labs reviewed: Low Na   NUTRITION - FOCUSED PHYSICAL EXAM:    Most Recent Value  Orbital Region  Severe depletion  Upper Arm Region  Severe depletion  Thoracic and Lumbar Region  Unable to assess  Buccal Region  Moderate depletion  Temple Region  Severe depletion  Clavicle Bone  Region  Severe depletion  Clavicle and Acromion Bone Region  Severe depletion  Scapular Bone Region  Unable to assess  Dorsal Hand  Moderate depletion  Patellar Region  Severe depletion  Anterior Thigh Region  Severe depletion  Posterior Calf Region  Severe depletion  Edema (RD Assessment)  None       Diet Order:   Diet Order           Diet regular Room service appropriate? Yes; Fluid consistency: Thin  Diet effective now          EDUCATION NEEDS:      Skin:  Skin Assessment: Reviewed RN Assessment  Last BM:  7/8  Height:   Ht Readings from Last 1 Encounters:  03/15/18 5\' 10"  (1.778 m)    Weight:   Wt Readings from Last 1 Encounters:  03/15/18 101 lb 3.1 oz (45.9 kg)    Ideal Body Weight:  75.5 kg  BMI:  Body mass index is 14.52 kg/m.  Estimated Nutritional Needs:   Kcal:  1800-2000  Protein:  85-95g  Fluid:  2L/day   Clayton Bibles, MS, RD, LDN [place] Dietitian Pager: [phone] After Hours Pager: [phone]

## 2018-03-15 NOTE — Progress Notes (Signed)
Pharmacy Antibiotic Note  James [name] is a 62 y.o. male admitted on 03/14/2018 with sepsis.  Pharmacy has been consulted for Cefepime dosing.  Plan: Cefepime 2gm iv x1, then 1gm iv q8hr  Height: 5\' 10"  (177.8 cm) Weight: 101 lb 3.1 oz (45.9 kg) IBW/kg (Calculated) : 73  Temp (24hrs), Avg:94.9 F (34.9 C), Min:91.2 F (32.9 C), Max:97.8 F (36.6 C)  Recent Labs  Lab 03/14/18 1849 03/14/18 1855 03/14/18 2035 03/14/18 2146  WBC  --   --  43.2*  --   CREATININE 0.80  --   --   --   LATICACIDVEN  --  >17.00*  --  17.8*    Estimated Creatinine Clearance: 63.8 mL/min (by C-G formula based on SCr of 0.8 mg/dL).    No Known Allergies  Antimicrobials this admission: Cefepime 03/15/2018 >>  Dose adjustments this admission: -  Microbiology results: -  Thank you for allowing pharmacy to be a part of this patient's care.  Nani Skillern [place] 03/15/2018 6:04 AM

## 2018-03-15 NOTE — Progress Notes (Signed)
[facility] PROGRESS NOTE    Progress Note  James [name]  QMG:[id number] DOB: Feb 07, 1956 DOA: 03/14/2018 PCP: Denita Lung, MD     Brief Narrative:   James [name] is an 62 y.o. 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  He was brought in because of progressive weakness near syncope and confusion  Assessment/Plan:   Severe symptomatic Anemia/iron deficiency anemia/  Iron deficiency anemia due to chronic blood loss: Similar presentation back in January. He has received 3 units of packed red blood cells, will check a CBC, to see where he is at.  We will continue with packed red blood cells transfusion. He relates he has seen bright red blood per rectum on 4 days prior to admission. He has not been compliant with his oral iron. Is likely due to AVMs we will continue to monitor, transfer to [place]. FOBT has been negative x2, his MCV is low there is likely chronic loss. Noncompliant with his medication.  Acute encephalopathy: Multifactorial likely due to alcoholism and low hemoglobin. Most likely due to infectious etiology. Morning he is more awake able to carry on a conversation he is awake alert and oriented x3.  Near syncope: Likely due to severe anemia. S/p fluid resuscitation and now improved.  Severe leukocytosis: Most likely reactive, treated empirically with IV antibiotics due to the concern of sepsis. He has remained afebrile James [name] was hypothermic on admission. X-ray shows no signs of infiltrates on physical exam lung exam is benign, he denies any cough or shortness of breath. Blood cultures and urine cultures are pending we will continue empiric antibiotics for now.  Lactic acidosis: Could be multifactorial due to severe anemia, malnutrition and alcoholism. This likely due to sepsis is cannot  find a source at this point in time. We will recheck a lactic acid this afternoon.  His blood pressure and heart rate are stable.  Tobacco abuse Counseling.  Alcohol abuse We will monitor with CIWA protocol, he denies any abdominal pain. We will continue oral thiamine and folate.  Cachexia (HCC)/severe protein caloric malnutrition: Will advance his diet start of Ensure 3 times daily will consult nutrition   DVT prophylaxis: SCD Family Communication:none Disposition Plan/Barrier to D/C: unable to determine Code Status:     Code Status Orders  (From admission, onward)        Start     Ordered   03/14/18 2253  Full code  Continuous     03/14/18 2252    Code Status History    Date Active Date Inactive Code Status Order ID Comments User Context   09/10/2017 0102 09/14/2017 1959 Full Code [id number]  Arnell Asal, NP ED   03/02/2017 2314 03/04/2017 2156 Full Code [id number]  Ivor Costa, MD ED   09/17/2016 2152 09/21/2016 1818 Full Code [id number]  Etta Quill, DO ED   08/23/2015 1006 08/26/2015 1701 Full Code [id number]  Bonnielee Haff, MD Inpatient   08/21/2015 0513 08/21/2015 0540 Full Code [id number]  Ivor Costa, MD ED   01/01/2015 2218 01/05/2015 2232 Full Code [id number]  Deneise Lever, MD ED        IV Access:    Peripheral IV   Procedures and diagnostic studies:   Dg Chest 2 View  Result Date: 03/14/2018 CLINICAL DATA:  Weakness for 3-4 days, fell today, history interstitial lung disease, former  smoker EXAM: CHEST - 2 VIEW COMPARISON:  09/11/2017 FINDINGS: Normal heart size, mediastinal contours, and pulmonary vascularity. Lungs hyperinflated. Improved perihilar infiltrates versus prior study. Minimal residual accentuation of LEFT perihilar markings versus RIGHT. No definite acute infiltrate, pleural effusion or pneumothorax. Osseous structures unremarkable. IMPRESSION: Hyperinflated lungs with improved pulmonary infiltrates since previous exam. Electronically Signed    By: Lavonia Dana M.D.   On: 03/14/2018 20:29   Ct Head Wo Contrast  Result Date: 03/14/2018 CLINICAL DATA:  Altered level of consciousness, seizure activity 1 hour ago EXAM: CT HEAD WITHOUT CONTRAST TECHNIQUE: Contiguous axial images were obtained from the base of the skull through the vertex without intravenous contrast. Sagittal and coronal MPR images reconstructed from axial data set. COMPARISON:  Earlier study of 03/14/2018 FINDINGS: Brain: Generalized atrophy. Normal ventricular morphology. No midline shift or mass effect. Minimal small vessel chronic ischemic changes of deep cerebral white matter. No intracranial hemorrhage, mass lesion, or evidence acute infarction. No extra-axial fluid collections. Vascular: Atherosclerotic calcification of internal carotid arteries at skull base Skull: Intact calvaria. Wall thickening of the frontal and LEFT maxillary sinuses likely related to chronic sinusitis. Sinuses/Orbits: Air-fluid level LEFT maxillary sinus question sinusitis. Remaining visualized paranasal sinuses and mastoid air cells clear Other: N/A IMPRESSION: Atrophy with minimal small vessel chronic ischemic changes of deep cerebral white matter. No acute intracranial abnormalities. Question LEFT maxillary sinusitis. If patient has persistent or recurrent seizures, consider MR assessment. Electronically Signed   By: Lavonia Dana M.D.   On: 03/14/2018 23:26   Ct Head Wo Contrast  Result Date: 03/14/2018 CLINICAL DATA:  Minor head trauma, fell today, weakness for 3-4 days, former smoker, history alcohol abuse EXAM: CT HEAD WITHOUT CONTRAST TECHNIQUE: Contiguous axial images were obtained from the base of the skull through the vertex without intravenous contrast. COMPARISON:  None FINDINGS: Brain: Generalized atrophy. Normal ventricular morphology. No midline shift or mass effect. Small vessel chronic ischemic changes of deep cerebral white matter. No intracranial hemorrhage, mass lesion, evidence of acute  infarction, or extra-axial fluid collection. Vascular: Atherosclerotic calcifications of internal carotid arteries at skull base Skull: Demineralized but intact Sinuses/Orbits: Significant osseous thickening at the frontal sinus and LEFT maxillary sinus question related to chronic sinusitis. Air-fluid level LEFT maxillary sinus. Remaining visualized paranasal sinuses and mastoid air cells clear. Other: N/A IMPRESSION: Atrophy with small vessel chronic ischemic changes of deep cerebral white matter. No acute intracranial abnormalities. Question LEFT maxillary sinusitis. Electronically Signed   By: Lavonia Dana M.D.   On: 03/14/2018 20:13     Medical Consultants:    None.  Anti-Infectives:   Cefepime  Subjective:    James [name] he relates he is hungry has no new complaints no abdominal pain.  Objective:    Vitals:   03/15/18 0445 03/15/18 0500 03/15/18 0654 03/15/18 0655  BP: 126/67 126/67 135/84 135/84  Pulse:      Resp: (!) 23 (!) 28 (!) 25 (!) 23  Temp: 97.6 F (36.4 C)   97.7 F (36.5 C)  TempSrc: Oral   Axillary  SpO2: 100%   100%  Weight:      Height:        Intake/Output Summary (Last 24 hours) at 03/15/2018 0729 Last data filed at 03/15/2018 0655 Gross per 24 hour  Intake 1365.33 ml  Output 600 ml  Net 765.33 ml   Filed Weights   03/15/18 0000  Weight: 45.9 kg (101 lb 3.1 oz)    Exam: General exam: In no acute  distress. Respiratory system: Good air movement and clear to auscultation. Cardiovascular system: S1 & S2 heard, RRR.  Gastrointestinal system: Abdomen is nondistended, soft and nontender.  Central nervous system: Alert and oriented. No focal neurological deficits. Extremities: No pedal edema. Skin: No rashes, lesions or ulcers Psychiatry: Judgement and insight appear normal. Mood & affect appropriate.    Data Reviewed:    Labs: Basic Metabolic Panel: Recent Labs  Lab 03/14/18 1849  NA 134*  K 3.6  CL 98  CO2 8*  GLUCOSE 118*  BUN <5*    CREATININE 0.80  CALCIUM 9.2   GFR Estimated Creatinine Clearance: 63.8 mL/min (by C-G formula based on SCr of 0.8 mg/dL). Liver Function Tests: Recent Labs  Lab 03/14/18 1849  AST 64*  ALT 34  ALKPHOS 167*  BILITOT 1.7*  PROT 7.4  ALBUMIN 2.9*   Recent Labs  Lab 03/14/18 1849  LIPASE 27   No results for input(s): AMMONIA in the last 168 hours. Coagulation profile No results for input(s): INR, PROTIME in the last 168 hours.  CBC: Recent Labs  Lab 03/14/18 2035  WBC 43.2*  NEUTROABS 39.7*  HGB 2.1*  HCT 9.0*  MCV 63.4*  PLT 111*   Cardiac Enzymes: Recent Labs  Lab 03/14/18 2035  TROPONINI <0.03   BNP (last 3 results) No results for input(s): PROBNP in the last 8760 hours. CBG: Recent Labs  Lab 03/14/18 1853 03/14/18 2344  GLUCAP 126* 70   D-Dimer: No results for input(s): DDIMER in the last 72 hours. Hgb A1c: No results for input(s): HGBA1C in the last 72 hours. Lipid Profile: No results for input(s): CHOL, HDL, LDLCALC, TRIG, CHOLHDL, LDLDIRECT in the last 72 hours. Thyroid function studies: No results for input(s): TSH, T4TOTAL, T3FREE, THYROIDAB in the last 72 hours.  Invalid input(s): FREET3 Anemia work up: No results for input(s): VITAMINB12, FOLATE, FERRITIN, TIBC, IRON, RETICCTPCT in the last 72 hours. Sepsis Labs: Recent Labs  Lab 03/14/18 1855 03/14/18 2035 03/14/18 2146  WBC  --  43.2*  --   LATICACIDVEN >17.00*  --  17.8*   Microbiology No results found for this or any previous visit (from the past 240 hour(s)).   Medications:   . feeding supplement  1 Container Oral TID BM  . nicotine  14 mg Transdermal Daily   Continuous Infusions: . sodium chloride 100 mL/hr at 03/14/18 2348  . ceFEPime (MAXIPIME) IV    . famotidine (PEPCID) IV Stopped (03/15/18 0114)     LOS: 1 day   Charlynne Cousins  [facility] Pager [phone]  *Please refer to [email or web address], password [email or web address] to get updated schedule on who will round on  this patient, as hospitalists switch teams weekly. If 7PM-7AM, please contact night-coverage at [email or web address], password [email or web address] for any overnight needs.  03/15/2018, 7:29 AM

## 2018-03-16 ENCOUNTER — Inpatient Hospital Stay (HOSPITAL_COMMUNITY): Payer: BC Managed Care – PPO

## 2018-03-16 DIAGNOSIS — R197 Diarrhea, unspecified: Secondary | ICD-10-CM

## 2018-03-16 DIAGNOSIS — D696 Thrombocytopenia, unspecified: Secondary | ICD-10-CM

## 2018-03-16 DIAGNOSIS — G92 Toxic encephalopathy: Secondary | ICD-10-CM

## 2018-03-16 DIAGNOSIS — G9341 Metabolic encephalopathy: Secondary | ICD-10-CM

## 2018-03-16 LAB — BASIC METABOLIC PANEL
Anion gap: 10 (ref 5–15)
BUN: 11 mg/dL (ref 6–20)
CHLORIDE: 105 mmol/L (ref 98–111)
CO2: 22 mmol/L (ref 22–32)
Calcium: 8.6 mg/dL — ABNORMAL LOW (ref 8.9–10.3)
Creatinine, Ser: 0.45 mg/dL — ABNORMAL LOW (ref 0.61–1.24)
GFR calc Af Amer: >60 mL/min (ref >60)
GFR calc non Af Amer: >60 mL/min (ref >60)
GLUCOSE: 120 mg/dL — AB (ref 70–99)
POTASSIUM: 3.1 mmol/L — AB (ref 3.5–5.1)
Sodium: 137 mmol/L (ref 135–145)

## 2018-03-16 LAB — CBC WITH DIFFERENTIAL/PLATELET
Basophils Absolute: 0 10*3/uL (ref 0.0–0.1)
Basophils Relative: 0 %
Eosinophils Absolute: 0 10*3/uL (ref 0.0–0.7)
Eosinophils Relative: 0 %
HCT: 34.4 % — ABNORMAL LOW (ref 39.0–52.0)
HEMOGLOBIN: 11.6 g/dL — AB (ref 13.0–17.0)
LYMPHS ABS: 1.1 10*3/uL (ref 0.7–4.0)
LYMPHS PCT: 5 %
MCH: 26.5 pg (ref 26.0–34.0)
MCHC: 33.7 g/dL (ref 30.0–36.0)
MCV: 78.5 fL (ref 78.0–100.0)
Monocytes Absolute: 0.7 10*3/uL (ref 0.1–1.0)
Monocytes Relative: 4 %
NEUTROS ABS: 17.9 10*3/uL — AB (ref 1.7–7.7)
NEUTROS PCT: 91 %
Platelets: 78 10*3/uL — ABNORMAL LOW (ref 150–400)
RBC: 4.38 MIL/uL (ref 4.22–5.81)
RDW: 23.2 % — ABNORMAL HIGH (ref 11.5–15.5)
WBC: 19.7 10*3/uL — ABNORMAL HIGH (ref 4.0–10.5)

## 2018-03-16 LAB — URINE CULTURE: SPECIAL REQUESTS: NORMAL

## 2018-03-16 LAB — C DIFFICILE QUICK SCREEN W PCR REFLEX
C DIFFICLE (CDIFF) ANTIGEN: POSITIVE — AB
C Diff toxin: NEGATIVE

## 2018-03-16 LAB — CLOSTRIDIUM DIFFICILE BY PCR, REFLEXED: CDIFFPCR: POSITIVE — AB

## 2018-03-16 MED ORDER — POTASSIUM CHLORIDE CRYS ER 20 MEQ PO TBCR: 40.0000 meq | EXTENDED_RELEASE_TABLET | Freq: Two times a day (BID) | ORAL | Status: DC

## 2018-03-16 MED ORDER — KCL IN DEXTROSE-NACL 40-5-0.45 MEQ/L-%-% IV SOLN
INTRAVENOUS | Status: DC
  Administered 2018-03-16 – 2018-03-20 (×7): via INTRAVENOUS
  Filled 2018-03-16 (×7): qty 1000

## 2018-03-16 MED ORDER — SODIUM CHLORIDE 0.9 % IV BOLUS
1000.0000 mL | Freq: Once | INTRAVENOUS | Status: AC
  Administered 2018-03-16: 1000 mL via INTRAVENOUS

## 2018-03-16 MED ORDER — SODIUM CHLORIDE 0.9 % IV SOLN: INTRAVENOUS | Status: DC

## 2018-03-16 MED ORDER — POTASSIUM CHLORIDE IN NACL 40-0.9 MEQ/L-% IV SOLN
INTRAVENOUS | Status: DC
  Administered 2018-03-16: 125 mL/h via INTRAVENOUS
  Filled 2018-03-16: qty 1000

## 2018-03-16 MED ORDER — FAMOTIDINE IN NACL 20-0.9 MG/50ML-% IV SOLN
20.0000 mg | Freq: Two times a day (BID) | INTRAVENOUS | Status: DC
  Administered 2018-03-17 – 2018-03-22 (×12): 20 mg via INTRAVENOUS
  Filled 2018-03-16 (×12): qty 50

## 2018-03-16 MED ORDER — POTASSIUM CHLORIDE 2 MEQ/ML IV SOLN
INTRAVENOUS | Status: DC
  Administered 2018-03-16: 18:00:00 via INTRAVENOUS
  Filled 2018-03-16 (×2): qty 1000

## 2018-03-16 MED ORDER — POTASSIUM CHLORIDE 10 MEQ/100ML IV SOLN
10.0000 meq | INTRAVENOUS | Status: AC
  Administered 2018-03-17 (×4): 10 meq via INTRAVENOUS
  Filled 2018-03-16 (×4): qty 100

## 2018-03-16 NOTE — Evaluation (Signed)
Physical Therapy Evaluation Patient Details Name: James [name] MRN: [id number] DOB: [birth year] Today's Date: 03/16/2018   History of Present Illness  LEM [name] is an 62 y.o. 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  He was brought in because of progressive weakness near syncope and confusion  Clinical Impression  Pt admitted with above diagnosis. Pt currently with functional limitations due to the deficits listed below (see PT Problem List).  Pt will benefit from skilled PT to increase their independence and safety with mobility to allow discharge to the venue listed below.  Pt unable to follow directions and with decreased safety awareness.  Recommend SNF at this time.     Follow Up Recommendations SNF;Supervision/Assistance - 24 hour    Equipment Recommendations  None recommended by PT    Recommendations for Other Services       Precautions / Restrictions Precautions Precautions: Fall      Mobility  Bed Mobility Overal bed mobility: Needs Assistance Bed Mobility: Supine to Sit;Sit to Supine     Supine to sit: Min assist Sit to supine: +2 for physical assistance;Mod assist   General bed mobility comments: Pt restless upon entry and attempting to sit EOB.  When given verbal and visual cues to use rail or PT's hand, he was unable to do so.  When PT held his hand, he did pull up to EOB.  Did return to supine with MOD of 2.  Transfers                 General transfer comment: Pt attempting to stand stating, " I have to get my clothes. I want to go home."  Pt was unsafe to stand due to inability to follow directions.  He was too weak to stand without assistance.  Ambulation/Gait                Stairs            Wheelchair Mobility    Modified Rankin (Stroke Patients Only)        Balance Overall balance assessment: Needs assistance   Sitting balance-Leahy Scale: Fair                                       Pertinent Vitals/Pain Pain Assessment: Faces Faces Pain Scale: No hurt    Home Living Family/patient expects to be discharged to:: Private residence Living Arrangements: Spouse/significant other               Additional Comments: Unable to get any information from the patient. Nurse reports he lives with his wife.    Prior Function                 Hand Dominance        Extremity/Trunk Assessment   Upper Extremity Assessment Upper Extremity Assessment: Generalized weakness    Lower Extremity Assessment Lower Extremity Assessment: Generalized weakness       Communication      Cognition   Behavior During Therapy: Restless Overall Cognitive Status: No family/caregiver present to determine baseline cognitive functioning Area of Impairment: Orientation;Safety/judgement;Awareness                 Orientation Level: Disoriented to       Safety/Judgement:  Decreased awareness of safety;Decreased awareness of deficits Awareness: Intellectual   General Comments: Unable to follow commands      General Comments      Exercises     Assessment/Plan    PT Assessment Patient needs continued PT services  PT Problem List Decreased strength;Decreased mobility;Decreased balance       PT Treatment Interventions Functional mobility training;Therapeutic activities;Therapeutic exercise;Gait training;Balance training;DME instruction    PT Goals (Current goals can be found in the Care Plan section)  Acute Rehab PT Goals PT Goal Formulation: Patient unable to participate in goal setting Time For Goal Achievement: 03/30/18 Potential to Achieve Goals: Fair    Frequency Min 2X/week   Barriers to discharge        Co-evaluation               AM-PAC PT "6 Clicks" Daily Activity  Outcome Measure  Difficulty turning over in bed (including adjusting bedclothes, sheets and blankets)?: A Lot Difficulty moving from lying on back to sitting on the side of the bed? : A Lot Difficulty sitting down on and standing up from a chair with arms (e.g., wheelchair, bedside commode, etc,.)?: A Lot Help needed moving to and from a bed to chair (including a wheelchair)?: A Lot Help needed walking in hospital room?: A Lot Help needed climbing 3-5 steps with a railing? : A Lot 6 Click Score: 12    End of Session Equipment Utilized During Treatment: Gait belt Activity Tolerance: Other (comment)(treatment limited by confusion) Patient left: in bed;with bed alarm set Nurse Communication: Mobility status PT Visit Diagnosis: Difficulty in walking, not elsewhere classified (R26.2);Muscle weakness (generalized) (M62.81);Adult, failure to thrive (R62.7)    Time: 0786-7544 PT Time Calculation (min) (ACUTE ONLY): 16 min   Charges:   PT Evaluation $PT Eval Moderate Complexity: 1 Mod     PT G Codes:        Parul Porcelli L. Tamala Julian, [place] Pager [phone] 03/16/2018   Galen Manila 03/16/2018, 12:42 PM

## 2018-03-16 NOTE — Evaluation (Signed)
Clinical/Bedside Swallow Evaluation Patient Details  Name: James [name] MRN: [id number] Date of Birth: [birth year]  Today's Date: 03/16/2018 Time: SLP Start Time (ACUTE ONLY): 1120 SLP Stop Time (ACUTE ONLY): 1145 SLP Time Calculation (min) (ACUTE ONLY): 25 min  Past Medical History:  Past Medical History:  Diagnosis Date  . Alcohol abuse   . Anemia due to GI blood loss 09/2011   microcytic.  transfused for Hgb 3.5, MCV in 50s.   . Aortic insufficiency   . Benign neoplasm of colon   . Black stools 08/21/2015  . Cachexia ([place])   . Chest pain 08/21/2015  . Dysphagia   . ED (erectile dysfunction)   . Edema   . GERD (gastroesophageal reflux disease)   . ILD (interstitial lung disease) ([place])   . Iron deficiency anemia, unspecified   . Pulmonary nodule   . Reflux esophagitis   . Smoker   . Tobacco abuse   . Weight loss, abnormal    Past Surgical History:  Past Surgical History:  Procedure Laterality Date  . COLONOSCOPY  10/03/2011   Procedure: COLONOSCOPY;  Surgeon: Scarlette Shorts, MD;  Location: [place];  Service: Endoscopy;  Laterality: N/A;  . COLONOSCOPY N/A 09/13/2017   Procedure: COLONOSCOPY;  Surgeon: Carol Ada, MD;  Location: [place];  Service: Endoscopy;  Laterality: N/A;  . COLONOSCOPY WITH PROPOFOL N/A 08/22/2015   Procedure: COLONOSCOPY WITH PROPOFOL;  Surgeon: Wilford Corner, MD;  Location: [facility] ENDOSCOPY;  Service: Endoscopy;  Laterality: N/A;  . ENTEROSCOPY N/A 09/13/2017   Procedure: ENTEROSCOPY;  Surgeon: Carol Ada, MD;  Location: [facility] ENDOSCOPY;  Service: Endoscopy;  Laterality: N/A;  . ESOPHAGOGASTRODUODENOSCOPY  10/02/2011   Procedure: ESOPHAGOGASTRODUODENOSCOPY (EGD);  Surgeon: Scarlette Shorts, MD;  Location: [facility] ENDOSCOPY;  Service: Endoscopy;  Laterality: N/A;  . ESOPHAGOGASTRODUODENOSCOPY N/A 01/05/2015   Procedure: ESOPHAGOGASTRODUODENOSCOPY (EGD);  Surgeon: Inda Castle, MD;  Location: [place];  Service: Endoscopy;  Laterality: N/A;  .  ESOPHAGOGASTRODUODENOSCOPY (EGD) WITH PROPOFOL N/A 08/22/2015   Procedure: ESOPHAGOGASTRODUODENOSCOPY (EGD) WITH PROPOFOL;  Surgeon: Wilford Corner, MD;  Location: [facility] ENDOSCOPY;  Service: Endoscopy;  Laterality: N/A;  . ESOPHAGOGASTRODUODENOSCOPY (EGD) WITH PROPOFOL N/A 09/19/2016   Procedure: ESOPHAGOGASTRODUODENOSCOPY (EGD) WITH PROPOFOL;  Surgeon: Carol Ada, MD;  Location: [facility] ENDOSCOPY;  Service: Endoscopy;  Laterality: N/A;  . GIVENS CAPSULE STUDY N/A 09/19/2016   Procedure: GIVENS CAPSULE STUDY;  Surgeon: Carol Ada, MD;  Location: [place];  Service: Endoscopy;  Laterality: N/A;  . GIVENS CAPSULE STUDY N/A 09/11/2017   Procedure: GIVENS CAPSULE STUDY;  Surgeon: Carol Ada, MD;  Location: [place];  Service: Endoscopy;  Laterality: N/A;   HPI:  62 yo male adm to [facility] with symptomatic anemia, PMH + for etoh use, cachexia, former smoker, Hemoglobin 2 upon admit.  Swallow eval ordered due to pt having more difficulty.     Assessment / Plan / Recommendation Clinical Impression  [name] is currently overtly aspirating secretions characterized by wet gurgly breathing quality.  He does NOT follow directions to cough/clear his throat or attempt to expectorate. SLP orally suctioned copious amounts of viscous white secretions from oral cavity.  Pt's' mentation and secretion management prohibits him from having po at this time.  Pt's cough is weak and ineffective when reflexively conducted.  Will follow up next date and plan for MBS it pt alert.  Rec tsps thin water after oral care and frequent oral care/suction.  RN advised to recommendations. Thanks.  SLP Visit Diagnosis: Dysphagia, oropharyngeal phase (R13.12)    Aspiration  Risk  Severe aspiration risk;Risk for inadequate nutrition/hydration    Diet Recommendation NPO(tsps water after oral care)   Medication Administration: Via alternative means    Other  Recommendations Oral Care Recommendations: Oral care QID   Follow up  Recommendations (tbd)      Frequency and Duration min 2x/week  1 week       Prognosis Prognosis for Safe Diet Advancement: Guarded Barriers to Reach Goals: Severity of deficits;Behavior      Swallow Study   General Date of Onset: 03/16/18 HPI: 62 yo male adm to [facility] with symptomatic anemia, PMH + for etoh use, cachexia, former smoker, Hemoglobin 2 upon admit.  Swallow eval ordered due to pt having more difficulty.   Type of Study: Bedside Swallow Evaluation Diet Prior to this Study: Thin liquids(full liquid) Temperature Spikes Noted: No Respiratory Status: Room air History of Recent Intubation: No Behavior/Cognition: Doesn't follow directions Oral Cavity Assessment: Other (comment);Excessive secretions(excessive secretions retained in oral cavity requiring SLP to orally suction to remove) Oral Care Completed by SLP: Yes Oral Cavity - Dentition: Other (Comment)(poor dentition) Self-Feeding Abilities: Total assist Patient Positioning: Upright in bed Baseline Vocal Quality: Other (comment);Low vocal intensity Volitional Cough: Cognitively unable to elicit Volitional Swallow: Unable to elicit    Oral/Motor/Sensory Function Overall Oral Motor/Sensory Function: Generalized oral weakness   Ice Chips Ice chips: Not tested   Thin Liquid Thin Liquid: Impaired Other Comments: moisture via toothette -  no swallow elicited despite larger amount of fluid via toothette - orally suctioned    Nectar Thick Nectar Thick Liquid: Not tested   Honey Thick Honey Thick Liquid: Not tested   Puree Puree: Not tested   Solid   GO   Solid: Not tested        [name] 03/16/2018,12:26 PM   [name], [place] [facility] SLP [phone]

## 2018-03-16 NOTE — Progress Notes (Addendum)
[facility] PROGRESS NOTE    Progress Note  James [name]  RKY:[id number] DOB: 12/07/1955 DOA: 03/14/2018 PCP: Denita Lung, MD     Brief Narrative:   James [name] is an 62 y.o. 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  He was brought in because of progressive weakness near syncope and confusion  Assessment/Plan:   Severe symptomatic Anemia/iron deficiency anemia/  Iron deficiency anemia due to chronic blood loss: Similar presentation back in January, for which he had a complete work up. He has received 4 units of packed red blood cells, CBC 11.1 toady He has not been compliant with his oral iron. Is likely due to AVMs we will continue to monitor, transfer to [facility]. FOBT has been negative x2, his MCV is low there is likely chronic loss.  Acute encephalopathy: Multifactorial likely due to alcoholism and low hemoglobin and possible infectious etiology. He cont to be confused today he did receive IV ativan in the morning. CT head on admission showed no acute findings. Today he is persistently confused, check MRI.  Near syncope: Likely due to severe anemia. S/p fluid resuscitation and now improved.  Severe leukocytosis: Treated empirically with IV antibiotics due to the concern of sepsis. He has remained afebrile, was hypothermic on admission. U/s does not show sign of infection. CT head negative, having more than 4 watery stools, tendner abdomen on physical exam check a c.dif.  Lactic acidosis: Could be multifactorial due to severe anemia, malnutrition and alcoholism and possible infection. No source of infection has been identify. We will recheck a lactic acid this afternoon.  His blood pressure and heart rate are stable.  Tobacco abuse Counseling.  Alcohol abuse We will monitor with CIWA  protocol, he denies any abdominal pain. Continue  thiamine and folate.  Cachexia (HCC)/severe protein caloric malnutrition: Cont. Ensure 3 times daily will consult nutrition.  Thrombocytopenia: ? Due to alcohol abuse. Cont SCD's. Plt's have stabilize.  New watery diarrhea: Check c.dif, has persistent leukocytosis and with abdominal pain.   DVT prophylaxis: SCD Family Communication:none Disposition Plan/Barrier to D/C: unable to determine Code Status:     Code Status Orders  (From admission, onward)        Start     Ordered   03/14/18 2253  Full code  Continuous     03/14/18 2252    Code Status History    Date Active Date Inactive Code Status Order ID Comments User Context   09/10/2017 0102 09/14/2017 1959 Full Code [id number]  Arnell Asal, NP ED   03/02/2017 2314 03/04/2017 2156 Full Code [id number]  Ivor Costa, MD ED   09/17/2016 2152 09/21/2016 1818 Full Code [id number]  Etta Quill, DO ED   08/23/2015 1006 08/26/2015 1701 Full Code [id number]  Bonnielee Haff, MD Inpatient   08/21/2015 0513 08/21/2015 0540 Full Code [id number]  Ivor Costa, MD ED   01/01/2015 2218 01/05/2015 2232 Full Code [id number]  Deneise Lever, MD ED        IV Access:    Peripheral IV   Procedures and diagnostic studies:   Dg Chest 2 View  Result Date: 03/14/2018 CLINICAL DATA:  Weakness for 3-4 days, fell today, history interstitial lung disease, former smoker EXAM: CHEST - 2 VIEW COMPARISON:  09/11/2017 FINDINGS: Normal heart size, mediastinal contours, and pulmonary vascularity. Lungs hyperinflated. Improved perihilar  infiltrates versus prior study. Minimal residual accentuation of LEFT perihilar markings versus RIGHT. No definite acute infiltrate, pleural effusion or pneumothorax. Osseous structures unremarkable. IMPRESSION: Hyperinflated lungs with improved pulmonary infiltrates since previous exam. Electronically Signed   By: Lavonia Dana M.D.   On: 03/14/2018 20:29   Ct Head Wo  Contrast  Result Date: 03/14/2018 CLINICAL DATA:  Altered level of consciousness, seizure activity 1 hour ago EXAM: CT HEAD WITHOUT CONTRAST TECHNIQUE: Contiguous axial images were obtained from the base of the skull through the vertex without intravenous contrast. Sagittal and coronal MPR images reconstructed from axial data set. COMPARISON:  Earlier study of 03/14/2018 FINDINGS: Brain: Generalized atrophy. Normal ventricular morphology. No midline shift or mass effect. Minimal small vessel chronic ischemic changes of deep cerebral white matter. No intracranial hemorrhage, mass lesion, or evidence acute infarction. No extra-axial fluid collections. Vascular: Atherosclerotic calcification of internal carotid arteries at skull base Skull: Intact calvaria. Wall thickening of the frontal and LEFT maxillary sinuses likely related to chronic sinusitis. Sinuses/Orbits: Air-fluid level LEFT maxillary sinus question sinusitis. Remaining visualized paranasal sinuses and mastoid air cells clear Other: N/A IMPRESSION: Atrophy with minimal small vessel chronic ischemic changes of deep cerebral white matter. No acute intracranial abnormalities. Question LEFT maxillary sinusitis. If patient has persistent or recurrent seizures, consider MR assessment. Electronically Signed   By: Lavonia Dana M.D.   On: 03/14/2018 23:26   Ct Head Wo Contrast  Result Date: 03/14/2018 CLINICAL DATA:  Minor head trauma, fell today, weakness for 3-4 days, former smoker, history alcohol abuse EXAM: CT HEAD WITHOUT CONTRAST TECHNIQUE: Contiguous axial images were obtained from the base of the skull through the vertex without intravenous contrast. COMPARISON:  None FINDINGS: Brain: Generalized atrophy. Normal ventricular morphology. No midline shift or mass effect. Small vessel chronic ischemic changes of deep cerebral white matter. No intracranial hemorrhage, mass lesion, evidence of acute infarction, or extra-axial fluid collection. Vascular:  Atherosclerotic calcifications of internal carotid arteries at skull base Skull: Demineralized but intact Sinuses/Orbits: Significant osseous thickening at the frontal sinus and LEFT maxillary sinus question related to chronic sinusitis. Air-fluid level LEFT maxillary sinus. Remaining visualized paranasal sinuses and mastoid air cells clear. Other: N/A IMPRESSION: Atrophy with small vessel chronic ischemic changes of deep cerebral white matter. No acute intracranial abnormalities. Question LEFT maxillary sinusitis. Electronically Signed   By: Lavonia Dana M.D.   On: 03/14/2018 20:13     Medical Consultants:    None.  Anti-Infectives:   Cefepime  Subjective:    James [name] he relates he is hungry has no new complaints no abdominal pain.  Objective:    Vitals:   03/15/18 2110 03/16/18 0025 03/16/18 0447 03/16/18 0500  BP: 113/73 125/71 124/63 124/63  Pulse: 89 87 90 90  Resp: 15 15 17    Temp: 98.2 F (36.8 C) 98 F (36.7 C) 97.8 F (36.6 C)   TempSrc: Axillary Oral Oral   SpO2: 95% 97% 98%   Weight:      Height:        Intake/Output Summary (Last 24 hours) at 03/16/2018 1113 Last data filed at 03/16/2018 0851 Gross per 24 hour  Intake 4802.8 ml  Output 890 ml  Net 3912.8 ml   Filed Weights   03/15/18 0000  Weight: 45.9 kg (101 lb 3.1 oz)    Exam: General exam: In no acute distress, cachectic Respiratory system: Good air movement and clear to auscultation. Cardiovascular system: S1 & S2 heard, RRR.  Gastrointestinal system: Abdomen is  nondistended, soft and nontender.  Central nervous system: not alert or oriented. None focal neurological deficits. Extremities: No pedal edema. Skin: No rashes, lesions or ulcers    Data Reviewed:    Labs: Basic Metabolic Panel: Recent Labs  Lab 03/14/18 1849 03/16/18 0719  NA 134* 137  K 3.6 3.1*  CL 98 105  CO2 8* 22  GLUCOSE 118* 120*  BUN <5* 11  CREATININE 0.80 0.45*  CALCIUM 9.2 8.6*   GFR Estimated  Creatinine Clearance: 63.8 mL/min (A) (by C-G formula based on SCr of 0.45 mg/dL (L)). Liver Function Tests: Recent Labs  Lab 03/14/18 1849  AST 64*  ALT 34  ALKPHOS 167*  BILITOT 1.7*  PROT 7.4  ALBUMIN 2.9*   Recent Labs  Lab 03/14/18 1849  LIPASE 27   No results for input(s): AMMONIA in the last 168 hours. Coagulation profile No results for input(s): INR, PROTIME in the last 168 hours.  CBC: Recent Labs  Lab 03/14/18 2035 03/15/18 0843 03/15/18 1529 03/16/18 0719  WBC 43.2* 29.1* 28.3* 19.7*  NEUTROABS 39.7* 27.9*  --  17.9*  HGB 2.1* 9.1* 11.1* 11.6*  HCT 9.0* 29.8* 33.6* 34.4*  MCV 63.4* 80.3 79.6 78.5  PLT 111* 96* 75* 78*   Cardiac Enzymes: Recent Labs  Lab 03/14/18 2035  TROPONINI <0.03   BNP (last 3 results) No results for input(s): PROBNP in the last 8760 hours. CBG: Recent Labs  Lab 03/14/18 1853 03/14/18 2344  GLUCAP 126* 70   D-Dimer: No results for input(s): DDIMER in the last 72 hours. Hgb A1c: No results for input(s): HGBA1C in the last 72 hours. Lipid Profile: No results for input(s): CHOL, HDL, LDLCALC, TRIG, CHOLHDL, LDLDIRECT in the last 72 hours. Thyroid function studies: No results for input(s): TSH, T4TOTAL, T3FREE, THYROIDAB in the last 72 hours.  Invalid input(s): FREET3 Anemia work up: Recent Labs    03/15/18 0843  VITAMINB12 1,337*  FOLATE 7.7  FERRITIN 14*  TIBC 360  IRON 108  RETICCTPCT 2.9   Sepsis Labs: Recent Labs  Lab 03/14/18 1855 03/14/18 2035 03/14/18 2146 03/15/18 0843 03/15/18 1529 03/16/18 0719  WBC  --  43.2*  --  29.1* 28.3* 19.7*  LATICACIDVEN >17.00*  --  17.8*  --  4.0*  --    Microbiology Recent Results (from the past 240 hour(s))  Urine culture     Status: Abnormal   Collection Time: 03/14/18  7:47 PM  Result Value Ref Range Status   Specimen Description   Final    URINE, CLEAN CATCH Performed at [facility], [place] [address], [place], [place] [address]     Special Requests   Final    Normal Performed at [facility], [place] [address] [place], [place] [address]    Culture MULTIPLE SPECIES PRESENT, SUGGEST RECOLLECTION (A)  Final   Report Status 03/16/2018 FINAL  Final     Medications:   . feeding supplement  1 Container Oral TID WC  . feeding supplement (ENSURE ENLIVE)  237 mL Oral BID BM  . ferrous sulfate  325 mg Oral TID WC  . folic acid  1 mg Oral Daily  . multivitamin with minerals  1 tablet Oral Daily  . nicotine  14 mg Transdermal Daily  . pantoprazole  40 mg Oral BID  . potassium chloride  40 mEq Oral BID  . thiamine  100 mg Oral Daily   Or  . thiamine  100 mg Intravenous Daily   Continuous  Infusions: . sodium chloride 100 mL/hr at 03/16/18 1019  . ceFEPime (MAXIPIME) IV 1 g (03/16/18 0618)     LOS: 2 days   [facility] Pager [phone]  *Please refer to [email or web address], password [email or web address] to get updated schedule on who will round on this patient, as hospitalists switch teams weekly. If 7PM-7AM, please contact night-coverage at [email or web address], password [email or web address] for any overnight needs.  03/16/2018, 11:13 AM

## 2018-03-17 ENCOUNTER — Inpatient Hospital Stay (HOSPITAL_COMMUNITY): Payer: BC Managed Care – PPO

## 2018-03-17 DIAGNOSIS — Z720 Tobacco use: Secondary | ICD-10-CM

## 2018-03-17 DIAGNOSIS — Z978 Presence of other specified devices: Secondary | ICD-10-CM

## 2018-03-17 DIAGNOSIS — J849 Interstitial pulmonary disease, unspecified: Secondary | ICD-10-CM

## 2018-03-17 LAB — BASIC METABOLIC PANEL
ANION GAP: 8 (ref 5–15)
BUN: 7 mg/dL (ref 6–20)
CALCIUM: 8.7 mg/dL — AB (ref 8.9–10.3)
CO2: 20 mmol/L — ABNORMAL LOW (ref 22–32)
Chloride: 111 mmol/L (ref 98–111)
Creatinine, Ser: 0.38 mg/dL — ABNORMAL LOW (ref 0.61–1.24)
GLUCOSE: 110 mg/dL — AB (ref 70–99)
POTASSIUM: 3 mmol/L — AB (ref 3.5–5.1)
Sodium: 139 mmol/L (ref 135–145)

## 2018-03-17 MED ORDER — IOPAMIDOL (ISOVUE-300) INJECTION 61%
INTRAVENOUS | Status: AC
  Filled 2018-03-17: qty 50

## 2018-03-17 MED ORDER — LIDOCAINE HCL URETHRAL/MUCOSAL 2 % EX GEL
CUTANEOUS | Status: AC
  Filled 2018-03-17: qty 30

## 2018-03-17 MED ORDER — POTASSIUM CHLORIDE 20 MEQ/15ML (10%) PO SOLN: 40.0000 meq | ORAL | Status: AC

## 2018-03-17 MED ORDER — VANCOMYCIN 50 MG/ML ORAL SOLUTION
125.0000 mg | Freq: Four times a day (QID) | ORAL | Status: DC
  Administered 2018-03-17 – 2018-03-20 (×8): 125 mg via ORAL
  Filled 2018-03-17 (×17): qty 2.5

## 2018-03-17 MED ORDER — POTASSIUM CHLORIDE CRYS ER 20 MEQ PO TBCR: 40.0000 meq | EXTENDED_RELEASE_TABLET | ORAL | Status: DC

## 2018-03-17 NOTE — Progress Notes (Signed)
Notified by bedside RN  to observe patient after NG tube misplaced into lung. NG tube removed prior to arriving to room. Recommended chest x-ray to rule out injury such as pneumothorax. MD Rizwan paged. Patient in no apparent respiratory distress. Patient able to state name, patient does have a difficult time clearing oral secretions. Oral secretions able to be suctioned via yankaeur. Breath sounds clear in upper lung fields bilaterally, clear and diminished in lower lung fields bilaterally. Patient does appear drowsy but able to be aroused by voice. Recommended to hold ativan for now. If patient clinical status changes, please call Rapid Response [id number].

## 2018-03-17 NOTE — Progress Notes (Signed)
  Speech Language Pathology Treatment: Dysphagia  Patient Details Name: James [name] MRN: [id number] DOB: [birth year] Today's Date: 03/17/2018 Time: 8527-7824 SLP Time Calculation (min) (ACUTE ONLY): 30 min  Assessment / Plan / Recommendation Clinical Impression  Pt seen at bedside to determine appropriateness for po intake. BSE completed yesterday (03/16/18) recommended NPO status due to poor secretion management. Pt was nonvocal today, so SLP was unable to assess voice quality. Pt continues unable to follow verbal commands. Oral care was completed with suction. SLP attempted to provide ice chip, however, pt did not respond to ice chip rubbed on his lips, or when placed in oral cavity. Ice chip removed with suction. Recommend continued NPO status. SLP will continue to follow to assess po readiness. RN informed.    HPI HPI: 62 yo male adm to [facility] with symptomatic anemia, PMH + for etoh use, cachexia, former smoker, Hemoglobin 2 upon admit.  Swallow eval ordered due to pt having more difficulty.        SLP Plan  Continue with current plan of care       Recommendations  Diet recommendations: NPO Medication Administration: Via alternative means                Oral Care Recommendations: Oral care QID;Staff/trained caregiver to provide oral care Follow up Recommendations: 24 hour supervision/assistance SLP Visit Diagnosis: Dysphagia, oropharyngeal phase (R13.12) Plan: Continue with current plan of care       GO              James [name] [facility], CCC-SLP Speech Language Pathologist [phone]  James [name] 03/17/2018, 12:59 PM

## 2018-03-17 NOTE — Progress Notes (Signed)
PROGRESS NOTE    James [name]   JKD:[id number]  DOB: [birth year]  DOA: 03/14/2018 PCP: Denita Lung, MD   Brief Narrative:  James [name] 62 y.o. male past medical history recurrent severe anemia requiring multiple transfusions due to AVMs with chronic blood loss, alcohol abuse cachectic comes in for near weakness, near syncope and was found to have a hemoglobin of 2. Noted to be confused in the ED with Temp of 91.2, BP 92/54, WBC 43.000.  Admitted for blood transfusions and started on IV antibiotics for possibly sepsis.    Subjective: Non verbal    Assessment & Plan:   Principal Problem:   Symptomatic Anemia  - Hb of 2 may have been inaccurate as after 3 U Hb was 9 - has h/o AVMs - he told another physician that he has bright red blood in stool and has not been compliant with Iron tabs - FOB negative X 2  - s/p 6 U PRBC and Hb now stable ~ 11.  Active Problems: Sepsis. C diff colitis - started on Cefepime initially - blood cultures have been negative - he has been having diarrhea and c diff Antigen was +, toxin was Negative and PCR is + - he is not very alert today and will be receiving an NG tube in order to receive vancomycin  Acute toxic encephalopathy - ? Delirium due to alcohol withdrawal - cont Thiamine- cont IVF with D5 - placing NG tube     Lactic acidosis - severe due to severe anemia and alcohol abuse in setting of liver disease - lactic acid of 17.8 initially and then 4.0  Hypokalemia - replace via NG tube    Tobacco abuse - Nicotine patch    Alcohol abuse and withdrawal - on CIWA scale folic acid and thiamine    Severe protein calorie malnutrition - NG tube to be placed today- dietician consulted for tube feeds    DVT prophylaxis: SCDs Code Status: Full code Family Communication:   Disposition Plan:  Treat withdrawal, C diff colitis Consultants:     Procedures:    NG tube Antimicrobials:  Anti-infectives (From admission, onward)     Start     Dose/Rate Route Frequency Ordered Stop   03/17/18 1000  vancomycin (VANCOCIN) 50 mg/mL oral solution 125 mg     125 mg Oral 4 times daily 03/17/18 0739 03/27/18 0959   03/15/18 0800  ceFEPIme (MAXIPIME) 1 g in sodium chloride 0.9 % 100 mL IVPB  Status:  Discontinued     1 g 200 mL/hr over 30 Minutes Intravenous Every 8 hours 03/15/18 0603 03/17/18 0737   03/15/18 0130  ceFEPIme (MAXIPIME) 2 g in sodium chloride 0.9 % 100 mL IVPB     2 g 200 mL/hr over 30 Minutes Intravenous  Once 03/15/18 0117 03/15/18 0220       Objective: Vitals:   03/16/18 1435 03/16/18 2137 03/17/18 0615 03/17/18 1418  BP: 121/87 128/77 123/65 (!) 137/91  Pulse: (!) 103 97 92 (!) 113  Resp: 14 20 18 18   Temp: 97.6 F (36.4 C) 97.7 F (36.5 C) 98.6 F (37 C) 97.8 F (36.6 C)  TempSrc:  Axillary Axillary Oral  SpO2: 95% 95% 98% 91%  Weight:      Height:        Intake/Output Summary (Last 24 hours) at 03/17/2018 1435 Last data filed at 03/17/2018 0612 Gross per 24 hour  Intake 3473.34 ml  Output 1425 ml  Net 2048.34 ml  Filed Weights   03/15/18 0000  Weight: 45.9 kg (101 lb 3.1 oz)    Examination: General exam: Appears comfortable but very sleepy HEENT: PERRLA, oral mucosa moist, no sclera icterus or thrush Respiratory system: Clear to auscultation. Respiratory effort normal. Cardiovascular system: S1 & S2 heard, RRR.   Gastrointestinal system: Abdomen soft, non-tender, nondistended. Normal bowel sound. No organomegaly Central nervous system: sleepy- does not follow commands Extremities: No cyanosis, clubbing or edema Skin: No rashes or ulcers   Data Reviewed: I have personally reviewed following labs and imaging studies  CBC: Recent Labs  Lab 03/14/18 2035 03/15/18 0843 03/15/18 1529 03/16/18 0719  WBC 43.2* 29.1* 28.3* 19.7*  NEUTROABS 39.7* 27.9*  --  17.9*  HGB 2.1* 9.1* 11.1* 11.6*  HCT 9.0* 29.8* 33.6* 34.4*  MCV 63.4* 80.3 79.6 78.5  PLT 111* 96* 75* 78*   Basic  Metabolic Panel: Recent Labs  Lab 03/14/18 1849 03/16/18 0719 03/17/18 0352  NA 134* 137 139  K 3.6 3.1* 3.0*  CL 98 105 111  CO2 8* 22 20*  GLUCOSE 118* 120* 110*  BUN <5* 11 7  CREATININE 0.80 0.45* 0.38*  CALCIUM 9.2 8.6* 8.7*   GFR: Estimated Creatinine Clearance: 63.8 mL/min (A) (by C-G formula based on SCr of 0.38 mg/dL (L)). Liver Function Tests: Recent Labs  Lab 03/14/18 1849  AST 64*  ALT 34  ALKPHOS 167*  BILITOT 1.7*  PROT 7.4  ALBUMIN 2.9*   Recent Labs  Lab 03/14/18 1849  LIPASE 27   No results for input(s): AMMONIA in the last 168 hours. Coagulation Profile: No results for input(s): INR, PROTIME in the last 168 hours. Cardiac Enzymes: Recent Labs  Lab 03/14/18 2035  TROPONINI <0.03   BNP (last 3 results) No results for input(s): PROBNP in the last 8760 hours. HbA1C: No results for input(s): HGBA1C in the last 72 hours. CBG: Recent Labs  Lab 03/14/18 1853 03/14/18 2344  GLUCAP 126* 70   Lipid Profile: No results for input(s): CHOL, HDL, LDLCALC, TRIG, CHOLHDL, LDLDIRECT in the last 72 hours. Thyroid Function Tests: No results for input(s): TSH, T4TOTAL, FREET4, T3FREE, THYROIDAB in the last 72 hours. Anemia Panel: Recent Labs    03/15/18 0843  VITAMINB12 1,337*  FOLATE 7.7  FERRITIN 14*  TIBC 360  IRON 108  RETICCTPCT 2.9   Urine analysis:    Component Value Date/Time   COLORURINE YELLOW 03/14/2018 1947   APPEARANCEUR CLEAR 03/14/2018 1947   LABSPEC 1.009 03/14/2018 1947   PHURINE 5.0 03/14/2018 1947   GLUCOSEU NEGATIVE 03/14/2018 1947   HGBUR SMALL (A) 03/14/2018 1947   BILIRUBINUR NEGATIVE 03/14/2018 1947   KETONESUR 20 (A) 03/14/2018 [place] NEGATIVE 03/14/2018 1947   NITRITE NEGATIVE 03/14/2018 1947   LEUKOCYTESUR NEGATIVE 03/14/2018 1947   Sepsis Labs: @LABRCNTIP (procalcitonin:4,lacticidven:4) ) Recent Results (from the past 240 hour(s))  Culture, blood (routine x 2)     Status: None (Preliminary result)    Collection Time: 03/14/18  7:04 PM  Result Value Ref Range Status   Specimen Description   Final    BLOOD BLOOD RIGHT FOREARM Performed at [facility], [place] [address], [place], [place] [address]    Special Requests   Final    BOTTLES DRAWN AEROBIC AND ANAEROBIC Blood Culture adequate volume Performed at [place] [address], [place], [place] [address]    Culture   Final    NO GROWTH 2 DAYS Performed at [facility], [address] [place], [place] [address]    Report Status PENDING  Incomplete  Culture, blood (routine x 2)     Status: None (Preliminary result)   Collection Time: 03/14/18  7:09 PM  Result Value Ref Range Status   Specimen Description   Final    BLOOD BLOOD RIGHT FOREARM Performed at [place] [address], [place], [place] [address]    Special Requests   Final    BOTTLES DRAWN AEROBIC AND ANAEROBIC Blood Culture adequate volume Performed at [place] [address], [place], [place] [address]    Culture   Final    NO GROWTH 2 DAYS Performed at [place] [address], [place], [place] [address]    Report Status PENDING  Incomplete  Urine culture     Status: Abnormal   Collection Time: 03/14/18  7:47 PM  Result Value Ref Range Status   Specimen Description   Final    URINE, CLEAN CATCH Performed at [facility], [place] [address], [place], [place] [address]    Special Requests   Final    Normal Performed at [facility], [place] [address] [place], [place] [address]    Culture MULTIPLE SPECIES PRESENT, SUGGEST RECOLLECTION (A)  Final   Report Status 03/16/2018 FINAL  Final  C difficile quick scan w PCR reflex     Status: Abnormal   Collection Time: 03/16/18 11:17 AM  Result Value Ref Range Status   C Diff antigen POSITIVE (A) NEGATIVE Final   C Diff toxin NEGATIVE NEGATIVE Final   C Diff interpretation  Results are indeterminate. See PCR results.  Final    Comment: Performed at [facility], [place] [address], [place], [place] [address]  C. Diff by PCR, Reflexed     Status: Abnormal   Collection Time: 03/16/18 11:17 AM  Result Value Ref Range Status   Toxigenic C. Difficile by PCR POSITIVE (A) NEGATIVE Final    Comment: Positive for toxigenic C. difficile with little to no toxin production. Only treat if clinical presentation suggests symptomatic illness. Performed at [facility], [place] [address] [place], [place] [address]          Radiology Studies: No results found.    Scheduled Meds: . feeding supplement  1 Container Oral TID WC  . feeding supplement (ENSURE ENLIVE)  237 mL Oral BID BM  . ferrous sulfate  325 mg Oral TID WC  . folic acid  1 mg Oral Daily  . multivitamin with minerals  1 tablet Oral Daily  . nicotine  14 mg Transdermal Daily  . potassium chloride  40 mEq Oral Q4H  . thiamine  100 mg Oral Daily   Or  . thiamine  100 mg Intravenous Daily  . vancomycin  125 mg Oral QID   Continuous Infusions: . dextrose 5 % and 0.45 % NaCl with KCl 40 mEq/L 100 mL/hr at 03/17/18 0535  . famotidine (PEPCID) IV Stopped (03/17/18 0253)     LOS: 3 days    Time spent in minutes: 35    Debbe Odea, MD [facility] Pager: [email or web address] Password [email or web address] 03/17/2018, 2:35 PM

## 2018-03-18 ENCOUNTER — Inpatient Hospital Stay (HOSPITAL_COMMUNITY): Payer: BC Managed Care – PPO

## 2018-03-18 ENCOUNTER — Encounter (HOSPITAL_COMMUNITY): Payer: Self-pay

## 2018-03-18 LAB — GLUCOSE, CAPILLARY: Glucose-Capillary: 117 mg/dL — ABNORMAL HIGH (ref 70–99)

## 2018-03-18 LAB — BASIC METABOLIC PANEL
Anion gap: 8 (ref 5–15)
BUN: <5 mg/dL — ABNORMAL LOW (ref 6–20)
CALCIUM: 9 mg/dL (ref 8.9–10.3)
CO2: 21 mmol/L — AB (ref 22–32)
CREATININE: 0.34 mg/dL — AB (ref 0.61–1.24)
Chloride: 110 mmol/L (ref 98–111)
GFR calc Af Amer: >60 mL/min (ref >60)
Glucose, Bld: 125 mg/dL — ABNORMAL HIGH (ref 70–99)
Potassium: 3.2 mmol/L — ABNORMAL LOW (ref 3.5–5.1)
Sodium: 139 mmol/L (ref 135–145)

## 2018-03-18 LAB — TYPE AND SCREEN
ABO/RH(D): O POS
Antibody Screen: POSITIVE
DAT, IgG: NEGATIVE
UNIT DIVISION: 0
UNIT DIVISION: 0
UNIT DIVISION: 0
UNIT DIVISION: 0
UNIT DIVISION: 0
Unit division: 0
Unit division: 0
Unit division: 0

## 2018-03-18 LAB — BPAM RBC
BLOOD PRODUCT EXPIRATION DATE: 201908102359
BLOOD PRODUCT EXPIRATION DATE: 201908102359
BLOOD PRODUCT EXPIRATION DATE: 201908102359
BLOOD PRODUCT EXPIRATION DATE: 201908102359
Blood Product Expiration Date: 201908102359
Blood Product Expiration Date: 201908102359
Blood Product Expiration Date: 201908102359
Blood Product Expiration Date: 201908102359
ISSUE DATE / TIME: 201907072230
ISSUE DATE / TIME: 201907080852
ISSUE DATE / TIME: 201907072230
ISSUE DATE / TIME: 201907080431
ISSUE DATE / TIME: 201907081053
UNIT TYPE AND RH: 5100
UNIT TYPE AND RH: 5100
UNIT TYPE AND RH: 5100
Unit Type and Rh: 5100
Unit Type and Rh: 5100
Unit Type and Rh: 5100
Unit Type and Rh: 5100
Unit Type and Rh: 5100

## 2018-03-18 LAB — CBC
HCT: 30.5 % — ABNORMAL LOW (ref 39.0–52.0)
Hemoglobin: 10.3 g/dL — ABNORMAL LOW (ref 13.0–17.0)
MCH: 26 pg (ref 26.0–34.0)
MCHC: 33.8 g/dL (ref 30.0–36.0)
MCV: 77 fL — ABNORMAL LOW (ref 78.0–100.0)
PLATELETS: 80 10*3/uL — AB (ref 150–400)
RBC: 3.96 MIL/uL — AB (ref 4.22–5.81)
RDW: 23.7 % — ABNORMAL HIGH (ref 11.5–15.5)
WBC: 19 10*3/uL — ABNORMAL HIGH (ref 4.0–10.5)

## 2018-03-18 LAB — MAGNESIUM: MAGNESIUM: 1.8 mg/dL (ref 1.7–2.4)

## 2018-03-18 LAB — PHOSPHORUS

## 2018-03-18 MED ORDER — POTASSIUM & SODIUM PHOSPHATES 280-160-250 MG PO PACK
1.0000 | PACK | Freq: Three times a day (TID) | ORAL | Status: DC
  Administered 2018-03-18 – 2018-03-23 (×12): 1 via NASOGASTRIC
  Filled 2018-03-18 (×24): qty 1

## 2018-03-18 MED ORDER — POTASSIUM PHOSPHATES 15 MMOLE/5ML IV SOLN
40.0000 meq | Freq: Once | INTRAVENOUS | Status: AC
  Administered 2018-03-18: 40 meq via INTRAVENOUS
  Filled 2018-03-18 (×2): qty 9.09

## 2018-03-18 MED ORDER — OSMOLITE 1.5 CAL PO LIQD
1000.0000 mL | ORAL | Status: DC
  Administered 2018-03-18: 1000 mL
  Filled 2018-03-18 (×4): qty 1000

## 2018-03-18 MED ORDER — LORAZEPAM 2 MG/ML IJ SOLN
1.0000 mg | INTRAMUSCULAR | Status: DC
  Filled 2018-03-18: qty 1

## 2018-03-18 MED ORDER — LORAZEPAM 2 MG/ML IJ SOLN
1.0000 mg | INTRAMUSCULAR | Status: AC
  Administered 2018-03-18: 1 mg via INTRAVENOUS
  Filled 2018-03-18 (×2): qty 1

## 2018-03-18 MED ORDER — ALBUTEROL SULFATE (2.5 MG/3ML) 0.083% IN NEBU
INHALATION_SOLUTION | RESPIRATORY_TRACT | Status: AC
  Filled 2018-03-18: qty 3

## 2018-03-18 MED ORDER — JEVITY 1.2 CAL PO LIQD: 1000.0000 mL | ORAL | Status: DC

## 2018-03-18 MED ORDER — ADULT MULTIVITAMIN LIQUID CH
15.0000 mL | Freq: Every day | ORAL | Status: DC
  Administered 2018-03-21 – 2018-03-25 (×5): 15 mL
  Filled 2018-03-18 (×8): qty 15

## 2018-03-18 MED ORDER — FERROUS SULFATE 300 (60 FE) MG/5ML PO SYRP
300.0000 mg | ORAL_SOLUTION | Freq: Three times a day (TID) | ORAL | Status: DC
  Administered 2018-03-18 (×2): 300 mg
  Filled 2018-03-18 (×8): qty 5

## 2018-03-18 MED ORDER — POTASSIUM CHLORIDE 20 MEQ/15ML (10%) PO SOLN
40.0000 meq | Freq: Once | ORAL | Status: AC
  Administered 2018-03-18: 40 meq
  Filled 2018-03-18: qty 30

## 2018-03-18 NOTE — Progress Notes (Addendum)
PROGRESS NOTE    James [name]   FOY:[id number]  DOB: [birth year]  DOA: 03/14/2018 PCP: Denita Lung, MD   Brief Narrative:  James [name] 62 y.o. male past medical history recurrent severe anemia requiring multiple transfusions due to AVMs with chronic blood loss, alcohol abuse cachectic comes in for near weakness, near syncope and was found to have a hemoglobin of 2. Noted to be confused in the ED with Temp of 91.2, BP 92/54, WBC 43.000.  Admitted for blood transfusions and started on IV antibiotics for possibly sepsis.    Subjective: Slightly more alert today but not talkative    Assessment & Plan:   Principal Problem:   Symptomatic Anemia  - Hb of 2 may have been inaccurate as after 3 U Hb was 9 - has h/o AVMs - he told another physician that he has bright red blood in stool and has not been compliant with Iron tabs - FOB negative X 2  - s/p 6 U PRBC and Hb now stable ~ 11.  Active Problems: Sepsis due to C diff colitis - started on Cefepime initially for sepsis of unknown origin- blood cultures have been negative - he has been having diarrhea  - c diff Antigen was +, toxin was Negative and PCR is + - 7/11-   NG tube placed as patient was not alert enough to take oral vancomycin- NG tube entered the lungs and then had to be replaced by IR- Vancomycin eventually was started- stools are no longer runny per RN  Acute toxic encephalopathy - ? Delirium due to alcohol withdrawal - cont Thiamine- cont IVF with D5 - MRI has been pending for days- he was too agitated for it to be done despite Ativan on 7/9 - trying to obtain MRI again today  NOTE: RN unable to flush tube- will obtain Xray today     Lactic acidosis - severe due to severe anemia and alcohol abuse in setting of liver disease - lactic acid of 17.8 initially and then 4.0  Hypokalemia, hypophosphatemia - replace via NG tube    Tobacco abuse - Nicotine patch    Alcohol abuse and withdrawal - on CIWA  scale folic acid and thiamine    Severe protein calorie malnutrition - NG tube to be placed today- dietician consulted for tube feeds  Thrombocytopenia - likely due to ETOH abuse   Anemia - normal Iron levels but Ferritin is low- will give Iron infusion prior to d/c home  - Folate and B12  normal  DVT prophylaxis: SCDs Code Status: Full code Family Communication:   Disposition Plan:  Treat withdrawal and C diff colitis Consultants:     Procedures:    NG tube Antimicrobials:  Anti-infectives (From admission, onward)   Start     Dose/Rate Route Frequency Ordered Stop   03/17/18 1000  vancomycin (VANCOCIN) 50 mg/mL oral solution 125 mg     125 mg Oral 4 times daily 03/17/18 0739 03/27/18 0959   03/15/18 0800  ceFEPIme (MAXIPIME) 1 g in sodium chloride 0.9 % 100 mL IVPB  Status:  Discontinued     1 g 200 mL/hr over 30 Minutes Intravenous Every 8 hours 03/15/18 0603 03/17/18 0737   03/15/18 0130  ceFEPIme (MAXIPIME) 2 g in sodium chloride 0.9 % 100 mL IVPB     2 g 200 mL/hr over 30 Minutes Intravenous  Once 03/15/18 0117 03/15/18 0220       Objective: Vitals:   03/17/18 0615 03/17/18 1418  03/17/18 2227 03/18/18 0651  BP: 123/65 (!) 137/91 (!) 115/59 (!) 119/59  Pulse: 92 (!) 113 96 91  Resp: 18 18 16 16   Temp: 98.6 F (37 C) 97.8 F (36.6 C) 98.4 F (36.9 C) 97.9 F (36.6 C)  TempSrc: Axillary Oral Axillary Oral  SpO2: 98% 91% 94%   Weight:      Height:        Intake/Output Summary (Last 24 hours) at 03/18/2018 1442 Last data filed at 03/18/2018 0814 Gross per 24 hour  Intake -  Output 1051 ml  Net -1051 ml   Filed Weights   03/15/18 0000  Weight: 45.9 kg (101 lb 3.1 oz)    Examination: General exam: Appears comfortable but very sleepy HEENT: PERRLA, oral mucosa moist, no sclera icterus or thrush Respiratory system: Clear to auscultation. Respiratory effort normal. Cardiovascular system: S1 & S2 heard, RRR.   Gastrointestinal system: Abdomen soft,  non-tender, nondistended. Normal bowel sound. No organomegaly Central nervous system: sleepy- does not follow commands Extremities: No cyanosis, clubbing or edema Skin: No rashes or ulcers   Data Reviewed: I have personally reviewed following labs and imaging studies  CBC: Recent Labs  Lab 03/14/18 2035 03/15/18 0843 03/15/18 1529 03/16/18 0719 03/18/18 0427  WBC 43.2* 29.1* 28.3* 19.7* 19.0*  NEUTROABS 39.7* 27.9*  --  17.9*  --   HGB 2.1* 9.1* 11.1* 11.6* 10.3*  HCT 9.0* 29.8* 33.6* 34.4* 30.5*  MCV 63.4* 80.3 79.6 78.5 77.0*  PLT 111* 96* 75* 78* 80*   Basic Metabolic Panel: Recent Labs  Lab 03/14/18 1849 03/16/18 0719 03/17/18 0352 03/18/18 0427  NA 134* 137 139 139  K 3.6 3.1* 3.0* 3.2*  CL 98 105 111 110  CO2 8* 22 20* 21*  GLUCOSE 118* 120* 110* 125*  BUN <5* 11 7 <5*  CREATININE 0.80 0.45* 0.38* 0.34*  CALCIUM 9.2 8.6* 8.7* 9.0  MG  --   --   --  1.8  PHOS  --   --   --  <1.0*   GFR: Estimated Creatinine Clearance: 63.8 mL/min (A) (by C-G formula based on SCr of 0.34 mg/dL (L)). Liver Function Tests: Recent Labs  Lab 03/14/18 1849  AST 64*  ALT 34  ALKPHOS 167*  BILITOT 1.7*  PROT 7.4  ALBUMIN 2.9*   Recent Labs  Lab 03/14/18 1849  LIPASE 27   No results for input(s): AMMONIA in the last 168 hours. Coagulation Profile: No results for input(s): INR, PROTIME in the last 168 hours. Cardiac Enzymes: Recent Labs  Lab 03/14/18 2035  TROPONINI <0.03   BNP (last 3 results) No results for input(s): PROBNP in the last 8760 hours. HbA1C: No results for input(s): HGBA1C in the last 72 hours. CBG: Recent Labs  Lab 03/14/18 1853 03/14/18 2344  GLUCAP 126* 70   Lipid Profile: No results for input(s): CHOL, HDL, LDLCALC, TRIG, CHOLHDL, LDLDIRECT in the last 72 hours. Thyroid Function Tests: No results for input(s): TSH, T4TOTAL, FREET4, T3FREE, THYROIDAB in the last 72 hours. Anemia Panel: No results for input(s): VITAMINB12, FOLATE,  FERRITIN, TIBC, IRON, RETICCTPCT in the last 72 hours. Urine analysis:    Component Value Date/Time   COLORURINE YELLOW 03/14/2018 1947   APPEARANCEUR CLEAR 03/14/2018 1947   LABSPEC 1.009 03/14/2018 1947   PHURINE 5.0 03/14/2018 1947   GLUCOSEU NEGATIVE 03/14/2018 1947   HGBUR SMALL (A) 03/14/2018 [place] NEGATIVE 03/14/2018 1947   KETONESUR 20 (A) 03/14/2018 [place] NEGATIVE 03/14/2018 1947  NITRITE NEGATIVE 03/14/2018 1947   LEUKOCYTESUR NEGATIVE 03/14/2018 1947   Sepsis Labs: @LABRCNTIP (procalcitonin:4,lacticidven:4) ) Recent Results (from the past 240 hour(s))  Culture, blood (routine x 2)     Status: None (Preliminary result)   Collection Time: 03/14/18  7:04 PM  Result Value Ref Range Status   Specimen Description   Final    BLOOD BLOOD RIGHT FOREARM Performed at [facility] [address], [place], [place] [address]    Special Requests   Final    BOTTLES DRAWN AEROBIC AND ANAEROBIC Blood Culture adequate volume Performed at [facility] [address], [place], [place] [address]    Culture   Final    NO GROWTH 3 DAYS Performed at [facility], [place] [address], [place], [place] [address]    Report Status PENDING  Incomplete  Culture, blood (routine x 2)     Status: None (Preliminary result)   Collection Time: 03/14/18  7:09 PM  Result Value Ref Range Status   Specimen Description   Final    BLOOD BLOOD RIGHT FOREARM Performed at [facility] [address], [place], [place] [address]    Special Requests   Final    BOTTLES DRAWN AEROBIC AND ANAEROBIC Blood Culture adequate volume Performed at [facility] [address] [place], [place] [address]    Culture   Final    NO GROWTH 3 DAYS Performed at [facility], [place] [address] [place], [place] [address]    Report Status PENDING  Incomplete  Urine culture     Status: Abnormal   Collection Time:  03/14/18  7:47 PM  Result Value Ref Range Status   Specimen Description   Final    URINE, CLEAN CATCH Performed at [facility], [place] [address], [place], [place] [address]    Special Requests   Final    Normal Performed at [facility], [place] [address] [place], [place] [address]    Culture MULTIPLE SPECIES PRESENT, SUGGEST RECOLLECTION (A)  Final   Report Status 03/16/2018 FINAL  Final  C difficile quick scan w PCR reflex     Status: Abnormal   Collection Time: 03/16/18 11:17 AM  Result Value Ref Range Status   C Diff antigen POSITIVE (A) NEGATIVE Final   C Diff toxin NEGATIVE NEGATIVE Final   C Diff interpretation Results are indeterminate. See PCR results.  Final    Comment: Performed at [facility], [place] [address], [place], [place] [address]  C. Diff by PCR, Reflexed     Status: Abnormal   Collection Time: 03/16/18 11:17 AM  Result Value Ref Range Status   Toxigenic C. Difficile by PCR POSITIVE (A) NEGATIVE Final    Comment: Positive for toxigenic C. difficile with little to no toxin production. Only treat if clinical presentation suggests symptomatic illness. Performed at [facility], [place] [address] [place], [place] [address]          Radiology Studies: Dg Chest Port 1 View  Result Date: 03/17/2018 CLINICAL DATA:  Status post removal of nasogastric tube from the trachea and bronchus. EXAM: PORTABLE CHEST 1 VIEW COMPARISON:  PA and lateral chest x-ray of March 14, 2018 FINDINGS: The interstitial markings of both lungs are diffusely increased. This is new. Near confluent density is noted in the infrahilar regions. There is no pleural effusion or pneumothorax. The heart is top-normal in size. The pulmonary vascularity is not  clearly engorged. IMPRESSION: Bilateral interstitial edema possibly related to endotracheal intubation by the nasogastric tube. Small alveolar opacities in both lower lobes may reflect developing  pneumonia. Electronically Signed   By: David  Martinique M.D.   On: 03/17/2018 15:32   Dg Abd Portable 1v  Result Date: 03/17/2018 CLINICAL DATA:  Nasogastric tube placement. EXAM: PORTABLE ABDOMEN - 1 VIEW COMPARISON:  None. FINDINGS: The bowel gas pattern is normal. Distal tip of nasogastric tube is seen in the right lower lobe bronchus. No abnormal calcifications are noted. IMPRESSION: Distal tip of nasogastric tube seen in right lower lobe bronchus. I spoke to the patient's nurse who stated they were already aware of the misplacement and head already removed the tube. Electronically Signed   By: Marijo Conception, M.D.   On: 03/17/2018 15:20   Dg Addison Bailey G Tube Plc W/fl W/rad  Result Date: 03/18/2018 CLINICAL DATA:  Encounter for NG placement. EXAM: NASO G TUBE PLACEMENT WITH FL AND WITH RAD FLUOROSCOPY TIME:  Fluoroscopy Time:  0 minutes 59 seconds Radiation Exposure Index (if provided by the fluoroscopic device): Number of Acquired Spot Images: 0 COMPARISON:  03/17/2018 FINDINGS: The patient was unresponsive upon arriving in radiology. Informed consent could not be obtained. 29 French NG tube was placed to the right nares. Gastric placement was difficult. Multiple placements into the right and left bronchus were encountered via intermittent fluoroscopy. With the neck in flexion, the tube was eventually manipulated into the esophagus and stomach. Final images demonstrate the NG tube coiled in the stomach. IMPRESSION: NG tube placement into the stomach.  Difficult placement. Electronically Signed   By: Franchot Gallo M.D.   On: 03/18/2018 07:04      Scheduled Meds: . ferrous sulfate  300 mg Per Tube TID WC  . folic acid  1 mg Oral Daily  . LORazepam  1 mg Intravenous On Call  . [START ON 03/19/2018] multivitamin  15 mL Per Tube Daily  . nicotine  14 mg Transdermal Daily  . potassium & sodium phosphates  1 packet Per NG tube TID WC & HS  . thiamine  100 mg Oral Daily   Or  . thiamine  100 mg  Intravenous Daily  . vancomycin  125 mg Oral QID   Continuous Infusions: . dextrose 5 % and 0.45 % NaCl with KCl 40 mEq/L 100 mL/hr at 03/18/18 0741  . famotidine (PEPCID) IV Stopped (03/18/18 1107)  . feeding supplement (OSMOLITE 1.5 CAL)    . potassium PHOSPHATE IVPB (mEq) 40 mEq (03/18/18 1224)     LOS: 4 days    Time spent in minutes: 35    Debbe Odea, MD [facility] Pager: [email or web address] Password [email or web address] 03/18/2018, 2:42 PM

## 2018-03-18 NOTE — Progress Notes (Signed)
TRH NOTIFIED OF PHOSPHORUS LEVEL OF LESS THAN ONE. NO INTERVENTIONS AT THIS TIME

## 2018-03-18 NOTE — Progress Notes (Addendum)
Writer left a VM for the dietician  [phone] and left a VM AT [phone].   0845. Writer spoke with [place]. He stated the MRI can probably be done this afternoon . I will administer Ativan 1 mg before PT goes down . If 1 mg is insufficient, I will go to the MRI  And administer Ativan 1 mg .   7199  Writer has spoken with Ria Comment the dietician. She is planning on starting PT on tube feeding

## 2018-03-18 NOTE — Progress Notes (Signed)
SLP Cancellation Note  Patient Details Name: COLTYN HANNING MRN: [id number] DOB: [birth year]   Cancelled treatment:       Reason Eval/Treat Not Completed: Fatigue/lethargy limiting ability to participate. Pt now has NGT. RN indicates MRI pending, and will begin TF after that. Pt status is essentially unchanged otherwise. ST will continue to follow.  Sukhdeep Wieting B. Quentin Ore [facility], CCC-SLP Speech Language Pathologist [phone]  Shonna Chock 03/18/2018, 12:42 PM

## 2018-03-18 NOTE — Progress Notes (Signed)
Nutrition Follow-up  DOCUMENTATION CODES:   Severe malnutrition in context of chronic illness, Underweight  INTERVENTION:   Monitor magnesium, potassium, and phosphorus daily for at least 3 days, MD to replete as needed, as pt is at risk for refeeding syndrome given severe malnutrition, ETOH abuse. Now with Phos of <1.  7/11: Initiate Osmolite 1.5 @ 20 ml/hr x 24 hours via NGT following MRI.  7/12: If tolerated on 7/11, advance by 10 ml every 12 hours to goal rate of 55 ml/hr. Will need additional free water of 200 ml QID. This will provide 1980 kcal, 82g protein and 1805 ml H2O.  NUTRITION DIAGNOSIS:   Severe Malnutrition related to chronic illness(ETOH abuse) as evidenced by energy intake < or equal to 75% for > or equal to 1 month, severe fat depletion, severe muscle depletion.  Ongoing.  GOAL:   Patient will meet greater than or equal to 90% of their needs  Not meeting.  MONITOR:   PO intake, Supplement acceptance, Weight trends, Labs, I & O's  REASON FOR ASSESSMENT:   Consult Enteral/tube feeding initiation and management  ASSESSMENT:   62 y.o. 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  He was brought in because of progressive weakness near syncope and confusion  Pt had NGT placed 7/10. Now to start TF. Pt is showing signs of refeeding syndrome, will initiate Osmolite 1.5 @ 20 for 24 hours. Will slowly advance to goal rate of continuous infusion which is 55 ml/hr.  Per discussion with RN, pt is nonverbal today. Pt in room with no family at bedside. Will continue to monitor.  Medications: Ferrous sulfate tablet TID, Folic acid tablet daily, Multivitamin with minerals daily, Phos-NAK, IV Thiamine daily, D5 and .45% NaCl w/ KCl infusion at 100 ml/hr -provides 408 kcal, IV K Phos  Labs reviewed: Low K,  Phos Mg WNL  Diet Order:   Diet Order           Diet NPO time specified  Diet effective now          EDUCATION NEEDS:      Skin:  Skin Assessment: Reviewed RN Assessment  Last BM:  7/8  Height:   Ht Readings from Last 1 Encounters:  03/15/18 5\' 10"  (1.778 m)    Weight:   Wt Readings from Last 1 Encounters:  03/15/18 101 lb 3.1 oz (45.9 kg)    Ideal Body Weight:  75.5 kg  BMI:  Body mass index is 14.52 kg/m.  Estimated Nutritional Needs:   Kcal:  1800-2000  Protein:  85-95g  Fluid:  2L/day  Clayton Bibles, MS, RD, LDN [place] Pager: [phone] After Hours Pager: [phone]

## 2018-03-19 ENCOUNTER — Inpatient Hospital Stay (HOSPITAL_COMMUNITY)
Admit: 2018-03-19 | Discharge: 2018-03-19 | Disposition: A | Discharge status: Home / Self Care | Payer: BC Managed Care – PPO | Attending: Neurology | Admitting: Neurology

## 2018-03-19 ENCOUNTER — Inpatient Hospital Stay: Payer: Self-pay

## 2018-03-19 ENCOUNTER — Inpatient Hospital Stay (HOSPITAL_COMMUNITY): Payer: BC Managed Care – PPO

## 2018-03-19 LAB — GLUCOSE, CAPILLARY
GLUCOSE-CAPILLARY: 101 mg/dL — AB (ref 70–99)
GLUCOSE-CAPILLARY: 114 mg/dL — AB (ref 70–99)
GLUCOSE-CAPILLARY: 140 mg/dL — AB (ref 70–99)
GLUCOSE-CAPILLARY: 159 mg/dL — AB (ref 70–99)
Glucose-Capillary: 135 mg/dL — ABNORMAL HIGH (ref 70–99)
Glucose-Capillary: 103 mg/dL — ABNORMAL HIGH (ref 70–99)

## 2018-03-19 LAB — CBC
HEMATOCRIT: 31.5 % — AB (ref 39.0–52.0)
HEMOGLOBIN: 10.5 g/dL — AB (ref 13.0–17.0)
MCH: 26.6 pg (ref 26.0–34.0)
MCHC: 33.3 g/dL (ref 30.0–36.0)
MCV: 79.9 fL (ref 78.0–100.0)
Platelets: 72 10*3/uL — ABNORMAL LOW (ref 150–400)
RBC: 3.94 MIL/uL — ABNORMAL LOW (ref 4.22–5.81)
RDW: 25.1 % — ABNORMAL HIGH (ref 11.5–15.5)
WBC: 18.9 10*3/uL — ABNORMAL HIGH (ref 4.0–10.5)

## 2018-03-19 LAB — PHOSPHORUS: Phosphorus: <1 mg/dL — CL (ref 2.5–4.6)

## 2018-03-19 LAB — BASIC METABOLIC PANEL
Anion gap: 6 (ref 5–15)
BUN: <5 mg/dL — ABNORMAL LOW (ref 6–20)
CALCIUM: 8.2 mg/dL — AB (ref 8.9–10.3)
CO2: 22 mmol/L (ref 22–32)
CREATININE: 0.34 mg/dL — AB (ref 0.61–1.24)
Chloride: 111 mmol/L (ref 98–111)
Glucose, Bld: 134 mg/dL — ABNORMAL HIGH (ref 70–99)
Potassium: 2.8 mmol/L — ABNORMAL LOW (ref 3.5–5.1)
SODIUM: 139 mmol/L (ref 135–145)

## 2018-03-19 MED ORDER — METRONIDAZOLE IN NACL 5-0.79 MG/ML-% IV SOLN
500.0000 mg | Freq: Three times a day (TID) | INTRAVENOUS | Status: DC
  Administered 2018-03-19 – 2018-03-22 (×9): 500 mg via INTRAVENOUS
  Filled 2018-03-19 (×10): qty 100

## 2018-03-19 MED ORDER — POTASSIUM CHLORIDE 10 MEQ/100ML IV SOLN
10.0000 meq | INTRAVENOUS | Status: AC
  Administered 2018-03-19 (×2): 10 meq via INTRAVENOUS
  Filled 2018-03-19 (×2): qty 100

## 2018-03-19 MED ORDER — VANCOMYCIN HCL 500 MG IV SOLR
500.0000 mg | Freq: Four times a day (QID) | Status: DC
  Filled 2018-03-19 (×7): qty 500

## 2018-03-19 MED ORDER — POTASSIUM PHOSPHATES 15 MMOLE/5ML IV SOLN
40.0000 meq | Freq: Once | INTRAVENOUS | Status: DC
  Filled 2018-03-19: qty 9.09

## 2018-03-19 MED ORDER — THIAMINE HCL 100 MG/ML IJ SOLN
500.0000 mg | Freq: Three times a day (TID) | INTRAVENOUS | Status: AC
  Administered 2018-03-19 – 2018-03-20 (×2): 500 mg via INTRAVENOUS
  Filled 2018-03-19 (×2): qty 5

## 2018-03-19 MED ORDER — VITAMIN B-1 100 MG PO TABS
100.0000 mg | ORAL_TABLET | Freq: Every day | ORAL | Status: DC
  Administered 2018-03-22 – 2018-03-27 (×6): 100 mg via ORAL
  Filled 2018-03-19 (×6): qty 1

## 2018-03-19 MED ORDER — SODIUM CHLORIDE 0.9% FLUSH
10.0000 mL | Freq: Two times a day (BID) | INTRAVENOUS | Status: DC
  Administered 2018-03-19 – 2018-03-24 (×8): 10 mL
  Administered 2018-03-25: 20 mL
  Administered 2018-03-25 – 2018-03-26 (×3): 10 mL
  Administered 2018-03-27: 20 mL
  Administered 2018-03-28 – 2018-03-29 (×4): 10 mL
  Administered 2018-03-30: 40 mL
  Administered 2018-03-30 – 2018-03-31 (×2): 10 mL
  Administered 2018-03-31: 20 mL
  Administered 2018-04-01 – 2018-04-02 (×2): 10 mL
  Administered 2018-04-02: 20 mL
  Administered 2018-04-03 – 2018-04-04 (×3): 10 mL

## 2018-03-19 MED ORDER — SODIUM CHLORIDE 0.9% FLUSH: 10.0000 mL | INTRAVENOUS | Status: DC | PRN

## 2018-03-19 MED ORDER — POTASSIUM CHLORIDE 10 MEQ/100ML IV SOLN
10.0000 meq | INTRAVENOUS | Status: DC
  Administered 2018-03-19 (×4): 10 meq via INTRAVENOUS
  Filled 2018-03-19 (×4): qty 100

## 2018-03-19 MED ORDER — THIAMINE HCL 100 MG/ML IJ SOLN: 100.0000 mg | Freq: Every day | INTRAMUSCULAR | Status: DC

## 2018-03-19 NOTE — Progress Notes (Signed)
Peripherally Inserted Central Catheter/Midline Placement  The IV Nurse has discussed with the patient and/or persons authorized to consent for the patient, the purpose of this procedure and the potential benefits and risks involved with this procedure.  The benefits include less needle sticks, lab draws from the catheter, and the patient may be discharged home with the catheter. Risks include, but not limited to, infection, bleeding, blood clot (thrombus formation), and puncture of an artery; nerve damage and irregular heartbeat and possibility to perform a PICC exchange if needed/ordered by physician.  Alternatives to this procedure were also discussed.  Bard Power PICC patient education guide, fact sheet on infection prevention and patient information card has been provided to patient /or left at bedside.    PICC/Midline Placement Documentation  PICC Double Lumen 03/19/18 PICC Right Brachial 37 cm 0 cm (Active)  Indication for Insertion or Continuance of Line Poor Vasculature-patient has had multiple peripheral attempts or PIVs lasting less than 24 hours 03/19/2018  6:06 PM  Exposed Catheter (cm) 0 cm 03/19/2018  6:06 PM  Site Assessment Clean;Dry;Intact 03/19/2018  6:06 PM  Lumen #1 Status Flushed;Blood return noted 03/19/2018  6:06 PM  Lumen #2 Status Flushed;Blood return noted 03/19/2018  6:06 PM  Dressing Type Transparent 03/19/2018  6:06 PM  Dressing Status Clean;Dry;Intact;Antimicrobial disc in place 03/19/2018  6:06 PM  Dressing Intervention New dressing 03/19/2018  6:06 PM  Dressing Change Due 03/26/18 03/19/2018  6:06 PM    Consent signed by wife at bedside   Synthia Innocent 03/19/2018, 6:07 PM

## 2018-03-19 NOTE — Progress Notes (Signed)
Patient's tube feeding started at 20 ml/hr.

## 2018-03-19 NOTE — Progress Notes (Signed)
EEG complete - results pending 

## 2018-03-19 NOTE — Procedures (Signed)
History: 62 yo M with AMS  Sedation: None  Technique: This is a 21 channel routine scalp EEG performed at the bedside with bipolar and monopolar montages arranged in accordance to the international 10/20 system of electrode placement. One channel was dedicated to EKG recording.    Background: There is a posterior dominant rhythm of 7.5 Hz that is poorly sustained and poorly formed.  There is also generalized irregular delta and theta activity even during maximal wakefulness.  Sleep is recorded with symmetric appearing structures.  Photic stimulation: Physiologic driving is not performed  EEG Abnormalities: 1) generalized irregular slow active 2) slow posterior dominant rhythm  Clinical Interpretation: This EEG is consistent with a mild generalized nonspecific cerebral dysfunction (encephalopathy). There was no seizure or seizure predisposition recorded on this study. Please note that a normal EEG does not preclude the possibility of epilepsy.   Roland Rack, MD [facility] [phone]  If 7pm- 7am, please page neurology on call as listed in [place].

## 2018-03-19 NOTE — Consult Note (Signed)
SLP Note  Patient Details Name: James [name] MRN: [id number] DOB: Oct 07, 1955  Pt pulled NGT, and is now without nutritional support. Status otherwise unchanged. Recommend consideration of Palliative Care consult to facilitate establishment of [place].  Celia B. Quentin Ore [facility], CCC-SLP Speech Language Pathologist [phone]  Shonna Chock 03/19/2018, 4:23 PM

## 2018-03-19 NOTE — Consult Note (Addendum)
NEURO HOSPITALIST CONSULT NOTE   Requestig physician: Dr. Wynelle Cleveland   Reason for Consult: abnormal MRI    History obtained from:   Chart     HPI:                                                                                                                                          James [name] is an 62 y.o. male with multiple medical problems including sever anemia, chronic alcoholism, hypotension and possible GI bleed.  who was admitted for "cachexia, confusion, and sudden AMS. MRI was obtained and showed pachymeningeial enhancement . On consultation patient is awake but only mumbles. follows no commands, is severely cachectic, prefers looking to the left but with spasticity in his neck looking to the left and pain when head is turned to the left. Winces to pain throughout. No history of orthostatic Headche can be obtained.   Past Medical History:  Diagnosis Date  . Alcohol abuse   . Anemia due to GI blood loss 09/2011   microcytic.  transfused for Hgb 3.5, MCV in 50s.   . Aortic insufficiency   . Benign neoplasm of colon   . Black stools 08/21/2015  . Cachexia ([place])   . Chest pain 08/21/2015  . Dysphagia   . ED (erectile dysfunction)   . Edema   . GERD (gastroesophageal reflux disease)   . ILD (interstitial lung disease) ([place])   . Iron deficiency anemia, unspecified   . Pulmonary nodule   . Reflux esophagitis   . Smoker   . Tobacco abuse   . Weight loss, abnormal     Past Surgical History:  Procedure Laterality Date  . COLONOSCOPY  10/03/2011   Procedure: COLONOSCOPY;  Surgeon: Scarlette Shorts, MD;  Location: [place];  Service: Endoscopy;  Laterality: N/A;  . COLONOSCOPY N/A 09/13/2017   Procedure: COLONOSCOPY;  Surgeon: Carol Ada, MD;  Location: [place];  Service: Endoscopy;  Laterality: N/A;  . COLONOSCOPY WITH PROPOFOL N/A 08/22/2015   Procedure: COLONOSCOPY WITH PROPOFOL;  Surgeon: Wilford Corner, MD;  Location: [facility] ENDOSCOPY;  Service:  Endoscopy;  Laterality: N/A;  . ENTEROSCOPY N/A 09/13/2017   Procedure: ENTEROSCOPY;  Surgeon: Carol Ada, MD;  Location: [facility] ENDOSCOPY;  Service: Endoscopy;  Laterality: N/A;  . ESOPHAGOGASTRODUODENOSCOPY  10/02/2011   Procedure: ESOPHAGOGASTRODUODENOSCOPY (EGD);  Surgeon: Scarlette Shorts, MD;  Location: [facility] ENDOSCOPY;  Service: Endoscopy;  Laterality: N/A;  . ESOPHAGOGASTRODUODENOSCOPY N/A 01/05/2015   Procedure: ESOPHAGOGASTRODUODENOSCOPY (EGD);  Surgeon: Inda Castle, MD;  Location: [place];  Service: Endoscopy;  Laterality: N/A;  . ESOPHAGOGASTRODUODENOSCOPY (EGD) WITH PROPOFOL N/A 08/22/2015   Procedure: ESOPHAGOGASTRODUODENOSCOPY (EGD) WITH PROPOFOL;  Surgeon: Wilford Corner, MD;  Location: [facility] ENDOSCOPY;  Service: Endoscopy;  Laterality: N/A;  . ESOPHAGOGASTRODUODENOSCOPY (EGD) WITH PROPOFOL N/A 09/19/2016  Procedure: ESOPHAGOGASTRODUODENOSCOPY (EGD) WITH PROPOFOL;  Surgeon: Carol Ada, MD;  Location: [facility] END[facility]COPY;  Service: Endoscopy;  Laterality: N/A;  . GIVENS CAPSULE STUDY N/A 09/19/2016   Procedure: GIVENS CAPSULE STUDY;  Surgeon: Carol Ada, MD;  Location: [place];  Service: Endoscopy;  Laterality: N/A;  . GIVENS CAPSULE STUDY N/A 09/11/2017   Procedure: GIVENS CAPSULE STUDY;  Surgeon: Carol Ada, MD;  Location: [place];  Service: Endoscopy;  Laterality: N/A;    Family History  Problem Relation Age of Onset  . Hypertension Mother   . Diabetes Maternal Grandfather           Social History:  reports that he has quit smoking. His smoking use included cigarettes. He has a 20.00 pack-year smoking history. He has never used smokeless tobacco. He reports that he drinks about 2.4 oz of alcohol per week. He reports that he has current or past drug history. Drug: Marijuana.  No Known Allergies  MEDICATIONS:                                                                                                                     Scheduled: . ferrous sulfate  300 mg Per Tube  TID WC  . folic acid  1 mg Oral Daily  . multivitamin  15 mL Per Tube Daily  . nicotine  14 mg Transdermal Daily  . potassium & sodium phosphates  1 packet Per NG tube TID WC & HS  . thiamine  100 mg Oral Daily   Or  . thiamine  100 mg Intravenous Daily  . vancomycin  125 mg Oral QID   Continuous: . dextrose 5 % and 0.45 % NaCl with KCl 40 mEq/L 100 mL/hr at 03/19/18 0445  . famotidine (PEPCID) IV 20 mg (03/19/18 0949)  . feeding supplement (OSMOLITE 1.5 CAL) Stopped (03/19/18 0200)  . potassium chloride 10 mEq (03/19/18 0949)     ROS:                                                                                                                                       History obtained from unobtainable from patient due to mental status    Blood pressure 114/72, pulse 82, temperature 98.6 F (37 C), temperature source Oral, resp. rate 18, height 5\' 10"  (1.778 m), weight 46 kg (101 lb 6.6 oz), SpO2 100 %.   General Examination:  Physical Exam  HEENT-  Normocephalic, no lesions, without obvious abnormality.  Normal external eye and conjunctiva.  r Extremities- Warm, dry and intact Musculoskeletal-no joint tenderness, deformity or swelling Skin-warm and dry, no hyperpigmentation, vitiligo, or suspicious lesions  Neurological Examination Mental Status: Very drowsy, follows no commands, mumbling, head turned to the right and but neck has spasticity --likely doe to prolonged bed rest and keeping head to right. Does not appear to be in any increased pain when torso lifted to full seated position.  Cranial Nerves: II: blinks to threat  III,IV, GU:RKYHC-WCBJSE motions intact bilaterally pupils equal, round, reactive to light and accommodation V,VII: face symmetric,winces to periorbital pain VIII: opens eyes to voice  Motor: Able to move extremities at times 3/5 with pain but at times  no movement to pain. No increased tone but significant loss of muscle.   bulk: atrophy noted Sensory: winces to pain to noxious stimuli bilaterally Deep Tendon Reflexes: 2+ and symmetric throughout UE and KJ no AJ Plantars: Right: downgoing   Left: downgoing Cerebellar: Not able to examine Gait: bed ridden   Lab Results: Basic Metabolic Panel: Recent Labs  Lab 03/14/18 1849 03/16/18 0719 03/17/18 0352 03/18/18 0427 03/19/18 0612  NA 134* 137 139 139 139  K 3.6 3.1* 3.0* 3.2* 2.8*  CL 98 105 111 110 111  CO2 8* 22 20* 21* 22  GLUCOSE 118* 120* 110* 125* 134*  BUN <5* 11 7 <5* <5*  CREATININE 0.80 0.45* 0.38* 0.34* 0.34*  CALCIUM 9.2 8.6* 8.7* 9.0 8.2*  MG  --   --   --  1.8  --   PHOS  --   --   --  <1.0* <1.0*    CBC: Recent Labs  Lab 03/14/18 2035 03/15/18 0843 03/15/18 1529 03/16/18 0719 03/18/18 0427 03/19/18 0612  WBC 43.2* 29.1* 28.3* 19.7* 19.0* 18.9*  NEUTROABS 39.7* 27.9*  --  17.9*  --   --   HGB 2.1* 9.1* 11.1* 11.6* 10.3* 10.5*  HCT 9.0* 29.8* 33.6* 34.4* 30.5* 31.5*  MCV 63.4* 80.3 79.6 78.5 77.0* 79.9  PLT 111* 96* 75* 78* 80* 72*    Cardiac Enzymes: Recent Labs  Lab 03/14/18 2035  TROPONINI <0.03    Lipid Panel: No results for input(s): CHOL, TRIG, HDL, CHOLHDL, VLDL, LDLCALC in the last 168 hours.  Imaging: Dg Abd 1 View  Result Date: 03/18/2018 CLINICAL DATA:  NG tube placement EXAM: ABDOMEN - 1 VIEW COMPARISON:  03/18/2018, earlier the same day. FINDINGS: NG tube is looped in the stomach. Bowel gas pattern is within normal limits. Lung bases show diffuse interstitial opacity with patchy airspace disease in the retrocardiac left base. IMPRESSION: 1. NG tube tip is in the stomach. 2. Diffuse interstitial and left basilar airspace disease. Electronically Signed   By: Misty Stanley M.D.   On: 03/18/2018 18:31   Mr Brain Wo Contrast  Result Date: 03/18/2018 CLINICAL DATA:  62 y/o  M; encephalopathy. EXAM: MRI HEAD WITHOUT CONTRAST  TECHNIQUE: Multiplanar, multiecho pulse sequences of the brain and surrounding structures were obtained without intravenous contrast. COMPARISON:  03/14/2018 CT head FINDINGS: Brain: Severe motion degradation of multiple sequences. No gross reduced diffusion to suggest acute or early subacute infarct. Small chronic infarct within the left hemi pons. Moderate diffuse brain parenchymal volume loss. No herniation. Diffuse smooth pachymeningeal thickening with T1 isointense and T2 hyperintense signal and incomplete FLAIR suppression greater over the right cerebral hemisphere than left. Vascular: Normal flow voids. Skull and upper cervical spine:  Normal marrow signal. Sinuses/Orbits: Negative. Other: None. IMPRESSION: 1. Motion degraded study. 2. New diffuse smooth dural thickening over the cerebral convexities. No significant mass effect. Findings probably represent thin subdural hematomas or intracranial hypotension, less likely pachymeningitis. 3. Small chronic left hemi pons infarction. Moderate brain parenchymal volume loss. These results will be called to the ordering clinician or representative by the Radiologist Assistant, and communication documented in the PACS or zVision Dashboard. Electronically Signed   By: Kristine Garbe M.D.   On: 03/18/2018 21:09   Dg Chest Port 1 View  Result Date: 03/17/2018 CLINICAL DATA:  Status post removal of nasogastric tube from the trachea and bronchus. EXAM: PORTABLE CHEST 1 VIEW COMPARISON:  PA and lateral chest x-ray of March 14, 2018 FINDINGS: The interstitial markings of both lungs are diffusely increased. This is new. Near confluent density is noted in the infrahilar regions. There is no pleural effusion or pneumothorax. The heart is top-normal in size. The pulmonary vascularity is not clearly engorged. IMPRESSION: Bilateral interstitial edema possibly related to endotracheal intubation by the nasogastric tube. Small alveolar opacities in both lower lobes may  reflect developing pneumonia. Electronically Signed   By: David  Martinique M.D.   On: 03/17/2018 15:32   Dg Abd Portable 1v  Result Date: 03/18/2018 CLINICAL DATA:  Nasogastric tube placement EXAM: PORTABLE ABDOMEN - 1 VIEW COMPARISON:  Portable exam at 1417 hrs compared to 03/17/2018 FINDINGS: Nasogastric tube is coiled in the stomach at the distal antrum with the tip reflected to the proximal stomach above the extent of the image. Normal bowel gas pattern. Bones demineralized. Extensive atherosclerotic calcifications. IMPRESSION: Nasogastric tube is coiled at the distal stomach with tip reflected into the proximal stomach cranial to the superior extent of this abdominal radiograph. Electronically Signed   By: Lavonia Dana M.D.   On: 03/18/2018 14:56   Dg Abd Portable 1v  Result Date: 03/17/2018 CLINICAL DATA:  Nasogastric tube placement. EXAM: PORTABLE ABDOMEN - 1 VIEW COMPARISON:  None. FINDINGS: The bowel gas pattern is normal. Distal tip of nasogastric tube is seen in the right lower lobe bronchus. No abnormal calcifications are noted. IMPRESSION: Distal tip of nasogastric tube seen in right lower lobe bronchus. I spoke to the patient's nurse who stated they were already aware of the misplacement and head already removed the tube. Electronically Signed   By: Marijo Conception, M.D.   On: 03/17/2018 15:20   Dg Addison Bailey G Tube Plc W/fl W/rad  Result Date: 03/18/2018 CLINICAL DATA:  Encounter for NG placement. EXAM: NASO G TUBE PLACEMENT WITH FL AND WITH RAD FLUOROSCOPY TIME:  Fluoroscopy Time:  0 minutes 59 seconds Radiation Exposure Index (if provided by the fluoroscopic device): Number of Acquired Spot Images: 0 COMPARISON:  03/17/2018 FINDINGS: The patient was unresponsive upon arriving in radiology. Informed consent could not be obtained. 61 French NG tube was placed to the right nares. Gastric placement was difficult. Multiple placements into the right and left bronchus were encountered via  intermittent fluoroscopy. With the neck in flexion, the tube was eventually manipulated into the esophagus and stomach. Final images demonstrate the NG tube coiled in the stomach. IMPRESSION: NG tube placement into the stomach.  Difficult placement. Electronically Signed   By: Franchot Gallo M.D.   On: 03/18/2018 07:04    Assessment and plan per attending neurologist  Etta Quill PA-C [facility] [phone]  03/19/2018, 9:48 AM   Assessment/ This is a 62 YO male presenting with acute encephalopathy on 03/14/2018 with  Cdiff colitis.  I think it is still possible that this represents a toxic/metabolic encephalopathy due to sepsis.    Of note, Flagyl can cause encephalopathy, though his imaging is not typical for metronidazole induced encephalopathy.  It is also possible that this represents delirium due to alcohol withdrawal, though other findings of DTs are not present.  Wernicke's would be a consideration and I would favor doing high-dose thiamine, especially given that he looks slightly cachectic.  I am uncertain how related his MRI findings are to his current presentation.  I do think an LP to rule out infectious causes of back meningitis would be prudent.   Recommendations: -EEG -LP by flouro for cells, glucose, protein, culture, fungal culture, cytology -Thiamine 500 mg 3 times daily x2 days -serum ace, RPR -Neurology will continue to follow  Roland Rack, MD [facility] [phone]  If 7pm- 7am, please page neurology on call as listed in [place].

## 2018-03-19 NOTE — Progress Notes (Signed)
Physical Therapy Treatment Patient Details Name: James [name] MRN: [id number] DOB: 11/10/1955 Today's Date: 03/19/2018    History of Present Illness James [name] is an 62 y.o. 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  He was brought in because of progressive weakness near syncope and confusion    PT Comments    Pt unable to follow commands.  Attempted to assist pt with UE and LE exercises however pt inconsistent.  No family present and pt with poor cognitive status.  Continue to recommend SNF upon d/c.  Follow Up Recommendations  SNF;Supervision/Assistance - 24 hour     Equipment Recommendations  None recommended by PT    Recommendations for Other Services       Precautions / Restrictions Precautions Precautions: Fall    Mobility  Bed Mobility Overal bed mobility: Needs Assistance             General bed mobility comments: Pt did not attempt to assist with bed mobility so did not perform  Transfers                    Ambulation/Gait                 Stairs             Wheelchair Mobility    Modified Rankin (Stroke Patients Only)       Balance                                            Cognition Arousal/Alertness: Awake/alert Behavior During Therapy: Flat affect Overall Cognitive Status: No family/caregiver present to determine baseline cognitive functioning Area of Impairment: Orientation;Safety/judgement;Awareness                 Orientation Level: Disoriented to;Place;Time;Situation       Safety/Judgement: Decreased awareness of safety;Decreased awareness of deficits     General Comments: Unable to follow commands      Exercises General Exercises - Upper Extremity Shoulder Flexion: 5 reps;Both;Supine;PROM Elbow Extension:  Both;10 reps;Supine;PROM General Exercises - Lower Extremity Ankle Circles/Pumps: AAROM;10 reps;Supine Heel Slides: AAROM;10 reps;Both;Supine Hip ABduction/ADduction: AAROM;10 reps;Both;Supine    General Comments        Pertinent Vitals/Pain Pain Assessment: Faces Faces Pain Scale: No hurt Pain Intervention(s): Repositioned    Home Living                      Prior Function            PT Goals (current goals can now be found in the care plan section) Progress towards PT goals: Not progressing toward goals - comment(cognition limiting)    Frequency    Min 2X/week      PT Plan Current plan remains appropriate    Co-evaluation              AM-PAC PT "6 Clicks" Daily Activity  Outcome Measure  Difficulty turning over in bed (including adjusting bedclothes, sheets and blankets)?: Unable Difficulty moving from lying on back to sitting on the side of the bed? : Unable Difficulty sitting down on and standing up from a chair with arms (e.g., wheelchair, bedside commode, etc,.)?: Unable Help needed moving to and  from a bed to chair (including a wheelchair)?: Total Help needed walking in hospital room?: Total Help needed climbing 3-5 steps with a railing? : Total 6 Click Score: 6    End of Session   Activity Tolerance: Other (comment)(cognition limiting) Patient left: in bed;with bed alarm set   PT Visit Diagnosis: Difficulty in walking, not elsewhere classified (R26.2);Muscle weakness (generalized) (M62.81);Adult, failure to thrive (R62.7)     Time: 1116-1130 PT Time Calculation (min) (ACUTE ONLY): 14 min  Charges:  $Therapeutic Exercise: 8-22 mins                    G Codes:       Carmelia Bake, PT, DPT 03/19/2018 Pager: [phone]  York Ram E 03/19/2018, 12:55 PM

## 2018-03-19 NOTE — Progress Notes (Signed)
Patient pulled out his NGT, feeding stopped at this time. On call physician Bodenheimer notified, he advised to leave NGT out for now as patient would need to return to IR for placement.

## 2018-03-19 NOTE — Progress Notes (Addendum)
PROGRESS NOTE    James [name]   TOI:[id number]  DOB: 1956-08-30  DOA: 03/14/2018 PCP: Denita Lung, MD   Brief Narrative:  James [name] 62 y.o. male past medical history recurrent severe anemia requiring multiple transfusions due to AVMs with chronic blood loss, alcohol abuse cachectic comes in for near weakness, near syncope and was found to have a hemoglobin of 2. Noted to be confused in the ED with Temp of 91.2, BP 92/54, WBC 43.000.  Admitted for blood transfusions and started on IV antibiotics for possibly sepsis.    Subjective: Still no communicative today. Pulled out NG last night around 2 PM.  Assessment & Plan:   Principal Problem:   Symptomatic Anemia  - Hb of 2 may have been inaccurate as after 3 U Hb was 9 - has h/o AVMs - he told another physician that he has bright red blood in stool and has not been compliant with Iron tabs - FOB negative X 2  - s/p 6 U PRBC and Hb now stable ~ 10- 11.  Active Problems: Sepsis due to C diff colitis - started on Cefepime initially for sepsis of unknown origin- blood cultures have been negative - he has been having diarrhea  - c diff Antigen was +, toxin was Negative and PCR is + - 7/10-   NG tube placed as patient was not alert enough to take oral vancomycin- NG tube entered the lungs and then had to be replaced by IR- Vancomycin eventually was started- stools are no longer runny per RN -7/11-  NG was not able to be flushed, xray showed it to be curled and hitting the gastric wall, it was pulled back and was able to be used again by 7/11 evening - 7/12 at 2 AM, pulled NG and yet to be replaced to continue treatment with Vanc  Pneumonia - CXR after NG was placed in patient's right lung showed b/l interstitial edema vs basilar consolidation - repeat CXR today  Hypokalemia/ hypophosphatemia - give KCL and K phos via IV until NG replaced-   Acute toxic encephalopathy - ? Delirium initially due to alcohol withdrawal- last  dose of Ativan was on 7/10 - cont Thiamine and IVF with D5 - MRI has been pending for days- he was too agitated for it to be done despite Ativan on 7/9 -  MRI finally obtained on 7/11 showed mild bilateral subdural hematomas vs intracranial hypotension vs pachymeningitis- I have consulted Neuro- he is still not awakening  - LP ordered by neuro     Lactic acidosis - severe due to severe anemia and alcohol abuse in setting of liver disease - lactic acid of 17.8 initially and then 4.0  Hypokalemia, hypophosphatemia - replace via NG tube    Tobacco abuse - Nicotine patch    Alcohol abuse and withdrawal - on CIWA scale folic acid and thiamine    Severe protein calorie malnutrition - NG tube to be placed today- dietician consulted for tube feeds  Thrombocytopenia - likely due to ETOH abuse   Anemia - normal Iron levels but Ferritin is low- will give Iron infusion prior to d/c home  - Folate and B12  normal  DVT prophylaxis: SCDs Code Status: Full code Family Communication:   Disposition Plan:  Treat C diff colitis, try to replace NG, try to identify source of his delirium Consultants:    Neuro Procedures:    NG tube x 2 Antimicrobials:  Anti-infectives (From admission, onward)  Start     Dose/Rate Route Frequency Ordered Stop   03/17/18 1000  vancomycin (VANCOCIN) 50 mg/mL oral solution 125 mg     125 mg Oral 4 times daily 03/17/18 0739 03/27/18 0959   03/15/18 0800  ceFEPIme (MAXIPIME) 1 g in sodium chloride 0.9 % 100 mL IVPB  Status:  Discontinued     1 g 200 mL/hr over 30 Minutes Intravenous Every 8 hours 03/15/18 0603 03/17/18 0737   03/15/18 0130  ceFEPIme (MAXIPIME) 2 g in sodium chloride 0.9 % 100 mL IVPB     2 g 200 mL/hr over 30 Minutes Intravenous  Once 03/15/18 0117 03/15/18 0220       Objective: Vitals:   03/18/18 0651 03/18/18 1600 03/18/18 1955 03/19/18 0500  BP: (!) 119/59 114/67 108/64 114/72  Pulse: 91 89 91 82  Resp: 16 16 14 18   Temp: 97.9 F  (36.6 C) 98.1 F (36.7 C) 98.4 F (36.9 C) 98.6 F (37 C)  TempSrc: Oral Oral Oral Oral  SpO2:  97% 98% 100%  Weight:   46 kg (101 lb 6.6 oz)   Height:        Intake/Output Summary (Last 24 hours) at 03/19/2018 1313 Last data filed at 03/19/2018 0500 Gross per 24 hour  Intake 393.25 ml  Output 200 ml  Net 193.25 ml   Filed Weights   03/15/18 0000 03/18/18 1955  Weight: 45.9 kg (101 lb 3.1 oz) 46 kg (101 lb 6.6 oz)    Examination: General exam: Appears comfortable  HEENT: PERRLA, oral mucosa moist, no sclera icterus or thrush Respiratory system: Clear to auscultation. Respiratory effort normal. Cardiovascular system: S1 & S2 heard, RRR.   Gastrointestinal system: Abdomen soft, non-tender, nondistended. Normal bowel sound. No organomegaly Central nervous system: difficult to arouse- does not open eyes for long- does not follow commands- non verbal Extremities: No cyanosis, clubbing or edema Skin: No rashes or ulcers   Data Reviewed: I have personally reviewed following labs and imaging studies  CBC: Recent Labs  Lab 03/14/18 2035 03/15/18 0843 03/15/18 1529 03/16/18 0719 03/18/18 0427 03/19/18 0612  WBC 43.2* 29.1* 28.3* 19.7* 19.0* 18.9*  NEUTROABS 39.7* 27.9*  --  17.9*  --   --   HGB 2.1* 9.1* 11.1* 11.6* 10.3* 10.5*  HCT 9.0* 29.8* 33.6* 34.4* 30.5* 31.5*  MCV 63.4* 80.3 79.6 78.5 77.0* 79.9  PLT 111* 96* 75* 78* 80* 72*   Basic Metabolic Panel: Recent Labs  Lab 03/14/18 1849 03/16/18 0719 03/17/18 0352 03/18/18 0427 03/19/18 0612  NA 134* 137 139 139 139  K 3.6 3.1* 3.0* 3.2* 2.8*  CL 98 105 111 110 111  CO2 8* 22 20* 21* 22  GLUCOSE 118* 120* 110* 125* 134*  BUN <5* 11 7 <5* <5*  CREATININE 0.80 0.45* 0.38* 0.34* 0.34*  CALCIUM 9.2 8.6* 8.7* 9.0 8.2*  MG  --   --   --  1.8  --   PHOS  --   --   --  <1.0* <1.0*   GFR: Estimated Creatinine Clearance: 63.9 mL/min (A) (by C-G formula based on SCr of 0.34 mg/dL (L)). Liver Function Tests: Recent  Labs  Lab 03/14/18 1849  AST 64*  ALT 34  ALKPHOS 167*  BILITOT 1.7*  PROT 7.4  ALBUMIN 2.9*   Recent Labs  Lab 03/14/18 1849  LIPASE 27   No results for input(s): AMMONIA in the last 168 hours. Coagulation Profile: No results for input(s): INR, PROTIME in the last 168  hours. Cardiac Enzymes: Recent Labs  Lab 03/14/18 2035  TROPONINI <0.03   BNP (last 3 results) No results for input(s): PROBNP in the last 8760 hours. HbA1C: No results for input(s): HGBA1C in the last 72 hours. CBG: Recent Labs  Lab 03/18/18 1951 03/19/18 0010 03/19/18 0502 03/19/18 0737 03/19/18 1248  GLUCAP 117* 159* 140* 135* 114*   Lipid Profile: No results for input(s): CHOL, HDL, LDLCALC, TRIG, CHOLHDL, LDLDIRECT in the last 72 hours. Thyroid Function Tests: No results for input(s): TSH, T4TOTAL, FREET4, T3FREE, THYROIDAB in the last 72 hours. Anemia Panel: No results for input(s): VITAMINB12, FOLATE, FERRITIN, TIBC, IRON, RETICCTPCT in the last 72 hours. Urine analysis:    Component Value Date/Time   COLORURINE YELLOW 03/14/2018 1947   APPEARANCEUR CLEAR 03/14/2018 1947   LABSPEC 1.009 03/14/2018 1947   PHURINE 5.0 03/14/2018 1947   GLUCOSEU NEGATIVE 03/14/2018 1947   HGBUR SMALL (A) 03/14/2018 [place] NEGATIVE 03/14/2018 1947   KETONESUR 20 (A) 03/14/2018 [place] NEGATIVE 03/14/2018 1947   NITRITE NEGATIVE 03/14/2018 1947   LEUKOCYTESUR NEGATIVE 03/14/2018 1947   Sepsis Labs: @LABRCNTIP (procalcitonin:4,lacticidven:4) ) Recent Results (from the past 240 hour(s))  Culture, blood (routine x 2)     Status: None (Preliminary result)   Collection Time: 03/14/18  7:04 PM  Result Value Ref Range Status   Specimen Description   Final    BLOOD BLOOD RIGHT FOREARM Performed at [facility], [place] [address], [place], [place] [address]    Special Requests   Final    BOTTLES DRAWN AEROBIC AND ANAEROBIC Blood Culture adequate volume Performed at  [place] [address] [place], [place] [address]    Culture   Final    NO GROWTH 4 DAYS Performed at [facility], [place] [address] [place], [place] [address]    Report Status PENDING  Incomplete  Culture, blood (routine x 2)     Status: None (Preliminary result)   Collection Time: 03/14/18  7:09 PM  Result Value Ref Range Status   Specimen Description   Final    BLOOD BLOOD RIGHT FOREARM Performed at [place] [address] [place], [place] [address]    Special Requests   Final    BOTTLES DRAWN AEROBIC AND ANAEROBIC Blood Culture adequate volume Performed at [place] [address], [place], [place] [address]    Culture   Final    NO GROWTH 4 DAYS Performed at [facility], [place] [address], [place], [place] [address]    Report Status PENDING  Incomplete  Urine culture     Status: Abnormal   Collection Time: 03/14/18  7:47 PM  Result Value Ref Range Status   Specimen Description   Final    URINE, CLEAN CATCH Performed at [facility], [place] [address], [place], [place] [address]    Special Requests   Final    Normal Performed at [facility], [place] [address] [place], [place] [address]    Culture MULTIPLE SPECIES PRESENT, SUGGEST RECOLLECTION (A)  Final   Report Status 03/16/2018 FINAL  Final  C difficile quick scan w PCR reflex     Status: Abnormal   Collection Time: 03/16/18 11:17 AM  Result Value Ref Range Status   C Diff antigen POSITIVE (A) NEGATIVE Final   C Diff toxin NEGATIVE NEGATIVE Final   C Diff interpretation Results are indeterminate. See PCR results.  Final    Comment: Performed at [facility], [place] [address], [place], [place] [address]  C. Diff by PCR, Reflexed     Status: Abnormal   Collection Time: 03/16/18 11:17 AM  Result Value Ref Range Status   Toxigenic C. Difficile by PCR POSITIVE (A) NEGATIVE Final     Comment: Positive for toxigenic C. difficile with little to no toxin production. Only treat if clinical presentation suggests symptomatic illness. Performed at [facility], [place] [address], [place], [place] [address]          Radiology Studies: Dg Abd 1 View  Result Date: 03/18/2018 CLINICAL DATA:  NG tube placement EXAM: ABDOMEN - 1 VIEW COMPARISON:  03/18/2018, earlier the same day. FINDINGS: NG tube is looped in the stomach. Bowel gas pattern is within normal limits. Lung bases show diffuse interstitial opacity with patchy airspace disease in the retrocardiac left base. IMPRESSION: 1. NG tube tip is in the stomach. 2. Diffuse interstitial and left basilar airspace disease. Electronically Signed   By: Misty Stanley M.D.   On: 03/18/2018 18:31   Mr Brain Wo Contrast  Result Date: 03/18/2018 CLINICAL DATA:  62 y/o  M; encephalopathy. EXAM: MRI HEAD WITHOUT CONTRAST TECHNIQUE: Multiplanar, multiecho pulse sequences of the brain and surrounding structures were obtained without intravenous contrast. COMPARISON:  03/14/2018 CT head FINDINGS: Brain: Severe motion degradation of multiple sequences. No gross reduced diffusion to suggest acute or early subacute infarct. Small chronic infarct within the left hemi pons. Moderate diffuse brain parenchymal volume loss. No herniation. Diffuse smooth pachymeningeal thickening with T1 isointense and T2 hyperintense signal and incomplete FLAIR suppression greater over the right cerebral hemisphere than left. Vascular: Normal flow voids. Skull and upper cervical spine: Normal marrow signal. Sinuses/Orbits: Negative. Other: None. IMPRESSION: 1. Motion degraded study. 2. New diffuse smooth dural thickening over the cerebral convexities. No significant mass effect. Findings probably represent thin subdural hematomas or intracranial hypotension, less likely pachymeningitis. 3. Small chronic left hemi pons infarction. Moderate brain parenchymal volume loss. These  results will be called to the ordering clinician or representative by the Radiologist Assistant, and communication documented in the PACS or zVision Dashboard. Electronically Signed   By: Kristine Garbe M.D.   On: 03/18/2018 21:09   Dg Chest Port 1 View  Result Date: 03/17/2018 CLINICAL DATA:  Status post removal of nasogastric tube from the trachea and bronchus. EXAM: PORTABLE CHEST 1 VIEW COMPARISON:  PA and lateral chest x-ray of March 14, 2018 FINDINGS: The interstitial markings of both lungs are diffusely increased. This is new. Near confluent density is noted in the infrahilar regions. There is no pleural effusion or pneumothorax. The heart is top-normal in size. The pulmonary vascularity is not clearly engorged. IMPRESSION: Bilateral interstitial edema possibly related to endotracheal intubation by the nasogastric tube. Small alveolar opacities in both lower lobes may reflect developing pneumonia. Electronically Signed   By: David  Martinique M.D.   On: 03/17/2018 15:32   Dg Abd Portable 1v  Result Date: 03/18/2018 CLINICAL DATA:  Nasogastric tube placement EXAM: PORTABLE ABDOMEN - 1 VIEW COMPARISON:  Portable exam at 1417 hrs compared to 03/17/2018 FINDINGS: Nasogastric tube is coiled in the stomach at the distal antrum with the tip reflected to the proximal stomach above the extent of the image. Normal bowel gas pattern. Bones demineralized. Extensive atherosclerotic calcifications. IMPRESSION: Nasogastric tube is coiled at the distal stomach with tip reflected into the proximal stomach cranial to the superior extent of this  abdominal radiograph. Electronically Signed   By: Lavonia Dana M.D.   On: 03/18/2018 14:56   Dg Abd Portable 1v  Result Date: 03/17/2018 CLINICAL DATA:  Nasogastric tube placement. EXAM: PORTABLE ABDOMEN - 1 VIEW COMPARISON:  None. FINDINGS: The bowel gas pattern is normal. Distal tip of nasogastric tube is seen in the right lower lobe bronchus. No abnormal calcifications  are noted. IMPRESSION: Distal tip of nasogastric tube seen in right lower lobe bronchus. I spoke to the patient's nurse who stated they were already aware of the misplacement and head already removed the tube. Electronically Signed   By: Marijo Conception, M.D.   On: 03/17/2018 15:20   Dg Addison Bailey G Tube Plc W/fl W/rad  Result Date: 03/18/2018 CLINICAL DATA:  Encounter for NG placement. EXAM: NASO G TUBE PLACEMENT WITH FL AND WITH RAD FLUOROSCOPY TIME:  Fluoroscopy Time:  0 minutes 59 seconds Radiation Exposure Index (if provided by the fluoroscopic device): Number of Acquired Spot Images: 0 COMPARISON:  03/17/2018 FINDINGS: The patient was unresponsive upon arriving in radiology. Informed consent could not be obtained. 89 French NG tube was placed to the right nares. Gastric placement was difficult. Multiple placements into the right and left bronchus were encountered via intermittent fluoroscopy. With the neck in flexion, the tube was eventually manipulated into the esophagus and stomach. Final images demonstrate the NG tube coiled in the stomach. IMPRESSION: NG tube placement into the stomach.  Difficult placement. Electronically Signed   By: Franchot Gallo M.D.   On: 03/18/2018 07:04      Scheduled Meds: . ferrous sulfate  300 mg Per Tube TID WC  . folic acid  1 mg Oral Daily  . multivitamin  15 mL Per Tube Daily  . nicotine  14 mg Transdermal Daily  . potassium & sodium phosphates  1 packet Per NG tube TID WC & HS  . thiamine  100 mg Oral Daily   Or  . thiamine  100 mg Intravenous Daily  . vancomycin  125 mg Oral QID   Continuous Infusions: . dextrose 5 % and 0.45 % NaCl with KCl 40 mEq/L 100 mL/hr at 03/19/18 0445  . famotidine (PEPCID) IV Stopped (03/19/18 1045)  . feeding supplement (OSMOLITE 1.5 CAL) Stopped (03/19/18 0200)  . potassium chloride 10 mEq (03/19/18 1214)     LOS: 5 days    Time spent in minutes: 35    Debbe Odea, MD [facility] Pager: [email or web address] Password [email or web address] 03/19/2018, 1:13 PM

## 2018-03-20 LAB — GLUCOSE, CAPILLARY
GLUCOSE-CAPILLARY: 99 mg/dL (ref 70–99)
Glucose-Capillary: 103 mg/dL — ABNORMAL HIGH (ref 70–99)
Glucose-Capillary: 107 mg/dL — ABNORMAL HIGH (ref 70–99)
Glucose-Capillary: 96 mg/dL (ref 70–99)
Glucose-Capillary: 96 mg/dL (ref 70–99)
Glucose-Capillary: 98 mg/dL (ref 70–99)

## 2018-03-20 LAB — CBC
HCT: 31.5 % — ABNORMAL LOW (ref 39.0–52.0)
Hemoglobin: 10.4 g/dL — ABNORMAL LOW (ref 13.0–17.0)
MCH: 26.9 pg (ref 26.0–34.0)
MCHC: 33 g/dL (ref 30.0–36.0)
MCV: 81.6 fL (ref 78.0–100.0)
Platelets: 67 10*3/uL — ABNORMAL LOW (ref 150–400)
RBC: 3.86 MIL/uL — ABNORMAL LOW (ref 4.22–5.81)
RDW: 26.8 % — AB (ref 11.5–15.5)
WBC: 13.8 10*3/uL — AB (ref 4.0–10.5)

## 2018-03-20 LAB — CULTURE, BLOOD (ROUTINE X 2)
CULTURE: NO GROWTH
Culture: NO GROWTH
SPECIAL REQUESTS: ADEQUATE
Special Requests: ADEQUATE

## 2018-03-20 LAB — BASIC METABOLIC PANEL
ANION GAP: 4 — AB (ref 5–15)
BUN: <5 mg/dL — ABNORMAL LOW (ref 6–20)
CALCIUM: 7.8 mg/dL — AB (ref 8.9–10.3)
CHLORIDE: 114 mmol/L — AB (ref 98–111)
CO2: 19 mmol/L — ABNORMAL LOW (ref 22–32)
CREATININE: 0.33 mg/dL — AB (ref 0.61–1.24)
GFR calc non Af Amer: >60 mL/min (ref >60)
GLUCOSE: 114 mg/dL — AB (ref 70–99)
Potassium: 3.6 mmol/L (ref 3.5–5.1)
Sodium: 137 mmol/L (ref 135–145)

## 2018-03-20 MED ORDER — VANCOMYCIN 50 MG/ML ORAL SOLUTION
125.0000 mg | Freq: Four times a day (QID) | ORAL | Status: AC
  Administered 2018-03-20 – 2018-03-28 (×25): 125 mg via ORAL
  Filled 2018-03-20 (×32): qty 2.5

## 2018-03-20 MED ORDER — SODIUM PHOSPHATES 45 MMOLE/15ML IV SOLN
30.0000 mmol | Freq: Once | INTRAVENOUS | Status: AC
  Administered 2018-03-20: 30 mmol via INTRAVENOUS
  Filled 2018-03-20: qty 10

## 2018-03-20 MED ORDER — KCL IN DEXTROSE-NACL 40-5-0.45 MEQ/L-%-% IV SOLN
INTRAVENOUS | Status: DC
  Administered 2018-03-20 – 2018-03-23 (×4): via INTRAVENOUS
  Filled 2018-03-20 (×7): qty 1000

## 2018-03-20 MED ORDER — VANCOMYCIN HCL 500 MG IV SOLR
500.0000 mg | Freq: Four times a day (QID) | Status: AC
  Administered 2018-03-27 – 2018-03-28 (×4): 500 mg via RECTAL
  Filled 2018-03-20 (×35): qty 500

## 2018-03-20 MED ORDER — FOLIC ACID 5 MG/ML IJ SOLN
1.0000 mg | Freq: Every day | INTRAMUSCULAR | Status: DC
  Administered 2018-03-20 – 2018-03-22 (×3): 1 mg via INTRAVENOUS
  Filled 2018-03-20 (×4): qty 0.2

## 2018-03-20 NOTE — Progress Notes (Signed)
Wrist restraints for the patient have been discontinued. The patient has mitts and is unable to pull at tubes.  He is being more compliant and more aware of his surroundings.

## 2018-03-20 NOTE — Progress Notes (Signed)
PROGRESS NOTE    James [name]   ZOX:[id number]  DOB: 1955-12-06  DOA: 03/14/2018 PCP: Denita Lung, MD   Brief Narrative:  James [name] 62 y.o. male past medical history recurrent severe anemia requiring multiple transfusions due to AVMs with chronic blood loss, alcohol abuse cachectic comes in for near weakness, near syncope and was found to have a hemoglobin of 2. Noted to be confused in the ED with Temp of 91.2, BP 92/54, WBC 43.000.  Admitted for blood transfusions and started on IV antibiotics for possibly sepsis.    Subjective: More alert today. Confused.   Assessment & Plan:   Principal Problem:   Symptomatic Anemia  - Hb of 2 may have been inaccurate as after 3 U Hb was 9 - has h/o AVMs - he told another physician that he has bright red blood in stool and has not been compliant with Iron tabs - FOB negative X 2  - s/p 6 U PRBC and Hb now stable ~ 10- 11.  Active Problems: Sepsis due to C diff colitis - started on Cefepime initially for sepsis of unknown origin- blood cultures have been negative - he has been having diarrhea  - c diff Antigen was +, toxin was Negative and PCR is + - 7/10-   NG tube placed as patient was not alert enough to take oral vancomycin- NG tube entered the lungs and then had to be replaced by IR- Vancomycin eventually was started- stools are no longer runny per RN -7/11-  NG was not able to be flushed, xray showed it to be curled and hitting the gastric wall, it was pulled back and was able to be used again by 7/11 evening - 7/12 at 2 AM, pulled NG and IR could not replace it - 7/13 more alert today- he did take about 3/4 of the oral Vancomycin- WBC down to 13.8  Basilar infiltrates - CXR after NG was placed in patient's right lung showed b/l interstitial edema vs basilar consolidation - repeat CXR is more consistent with edema rather than pneumonia - he does not appear fluid overloaded and is not hypoxic therefore I will hold off on  giving diuretics  Hypokalemia/ hypophosphatemia - give KCL and K phos    Acute toxic encephalopathy - ? Delirium initially due to alcohol withdrawal- last dose of Ativan was on 7/10 - cont Thiamine and IVF with D5 - MRI has been pending for days- he was too agitated for it to be done despite Ativan on 7/9 -  MRI finally obtained on 7/11 showed mild bilateral subdural hematomas vs intracranial hypotension vs pachymeningitis- I have consulted Neuro- he is still not awakening  - LP ordered by neuro - more alert today     Lactic acidosis - severe due to severe anemia and alcohol abuse in setting of liver disease - lactic acid of 17.8 initially and then 4.0     Tobacco abuse - Nicotine patch    Alcohol abuse and withdrawal - withdrawal symptoms resolved - cont  folic acid and thiamine    Severe protein calorie malnutrition - NG tube unable to be replaced after he pulled it - encourage oral intake  Thrombocytopenia - likely due to ETOH abuse   Anemia - normal Iron levels but Ferritin is low- will give Iron infusion prior to d/c home  - Folate and B12  normal  DVT prophylaxis: SCDs Code Status: Full code Family Communication:   Disposition Plan:  Treat C diff  colitis, try to replace NG, try to identify source of his delirium Consultants:    Neuro Procedures:    NG tube x 2 Antimicrobials:  Anti-infectives (From admission, onward)   Start     Dose/Rate Route Frequency Ordered Stop   03/19/18 2000  vancomycin (VANCOCIN) 500 mg in sodium chloride irrigation 0.9 % 100 mL ENEMA     500 mg Rectal Every 6 hours 03/19/18 1626 04/02/18 1759   03/19/18 1630  metroNIDAZOLE (FLAGYL) IVPB 500 mg     500 mg 100 mL/hr over 60 Minutes Intravenous Every 8 hours 03/19/18 1626     03/17/18 1000  vancomycin (VANCOCIN) 50 mg/mL oral solution 125 mg     125 mg Oral 4 times daily 03/17/18 0739 03/27/18 0959   03/15/18 0800  ceFEPIme (MAXIPIME) 1 g in sodium chloride 0.9 % 100 mL IVPB   Status:  Discontinued     1 g 200 mL/hr over 30 Minutes Intravenous Every 8 hours 03/15/18 0603 03/17/18 0737   03/15/18 0130  ceFEPIme (MAXIPIME) 2 g in sodium chloride 0.9 % 100 mL IVPB     2 g 200 mL/hr over 30 Minutes Intravenous  Once 03/15/18 0117 03/15/18 0220       Objective: Vitals:   03/19/18 2118 03/20/18 0453 03/20/18 0636 03/20/18 1259  BP: 129/66 118/62  (!) 110/91  Pulse: 92 85  100  Resp: 18 16  20   Temp: 98.2 F (36.8 C) 98 F (36.7 C)  98.1 F (36.7 C)  TempSrc: Oral Oral  Oral  SpO2: 100% 100%    Weight:   53.4 kg (117 lb 11.6 oz)   Height:        Intake/Output Summary (Last 24 hours) at 03/20/2018 1415 Last data filed at 03/20/2018 0645 Gross per 24 hour  Intake 3628.33 ml  Output 500 ml  Net 3128.33 ml   Filed Weights   03/15/18 0000 03/18/18 1955 03/20/18 0636  Weight: 45.9 kg (101 lb 3.1 oz) 46 kg (101 lb 6.6 oz) 53.4 kg (117 lb 11.6 oz)    Examination: General exam: Appears comfortable - malnourished HEENT: PERRLA, oral mucosa moist, no sclera icterus or thrush Respiratory system: Clear to auscultation. Respiratory effort normal. Cardiovascular system: S1 & S2 heard, RRR.   Gastrointestinal system: Abdomen soft, non-tender, nondistended. Normal bowel sound. No organomegaly Central nervous system: alert, confused, moved arms and legs Extremities: No cyanosis, clubbing or edema Skin: No rashes or ulcers   Data Reviewed: I have personally reviewed following labs and imaging studies  CBC: Recent Labs  Lab 03/14/18 2035 03/15/18 0843 03/15/18 1529 03/16/18 0719 03/18/18 0427 03/19/18 0612 03/20/18 0612  WBC 43.2* 29.1* 28.3* 19.7* 19.0* 18.9* 13.8*  NEUTROABS 39.7* 27.9*  --  17.9*  --   --   --   HGB 2.1* 9.1* 11.1* 11.6* 10.3* 10.5* 10.4*  HCT 9.0* 29.8* 33.6* 34.4* 30.5* 31.5* 31.5*  MCV 63.4* 80.3 79.6 78.5 77.0* 79.9 81.6  PLT 111* 96* 75* 78* 80* 72* 67*   Basic Metabolic Panel: Recent Labs  Lab 03/16/18 0719 03/17/18 0352  03/18/18 0427 03/19/18 0612 03/20/18 0612 03/20/18 1205  NA 137 139 139 139 137  --   K 3.1* 3.0* 3.2* 2.8* 3.6  --   CL 105 111 110 111 114*  --   CO2 22 20* 21* 22 19*  --   GLUCOSE 120* 110* 125* 134* 114*  --   BUN 11 7 <5* <5* <5*  --   CREATININE  0.45* 0.38* 0.34* 0.34* 0.33*  --   CALCIUM 8.6* 8.7* 9.0 8.2* 7.8*  --   MG  --   --  1.8  --   --   --   PHOS  --   --  <1.0* <1.0*  --  0.7*   GFR: Estimated Creatinine Clearance: 74.2 mL/min (A) (by C-G formula based on SCr of 0.33 mg/dL (L)). Liver Function Tests: Recent Labs  Lab 03/14/18 1849  AST 64*  ALT 34  ALKPHOS 167*  BILITOT 1.7*  PROT 7.4  ALBUMIN 2.9*   Recent Labs  Lab 03/14/18 1849  LIPASE 27   No results for input(s): AMMONIA in the last 168 hours. Coagulation Profile: No results for input(s): INR, PROTIME in the last 168 hours. Cardiac Enzymes: Recent Labs  Lab 03/14/18 2035  TROPONINI <0.03   BNP (last 3 results) No results for input(s): PROBNP in the last 8760 hours. HbA1C: No results for input(s): HGBA1C in the last 72 hours. CBG: Recent Labs  Lab 03/19/18 2117 03/20/18 0029 03/20/18 0417 03/20/18 0800 03/20/18 1241  GLUCAP 101* 96 107* 103* 96   Lipid Profile: No results for input(s): CHOL, HDL, LDLCALC, TRIG, CHOLHDL, LDLDIRECT in the last 72 hours. Thyroid Function Tests: No results for input(s): TSH, T4TOTAL, FREET4, T3FREE, THYROIDAB in the last 72 hours. Anemia Panel: No results for input(s): VITAMINB12, FOLATE, FERRITIN, TIBC, IRON, RETICCTPCT in the last 72 hours. Urine analysis:    Component Value Date/Time   COLORURINE YELLOW 03/14/2018 1947   APPEARANCEUR CLEAR 03/14/2018 1947   LABSPEC 1.009 03/14/2018 1947   PHURINE 5.0 03/14/2018 1947   GLUCOSEU NEGATIVE 03/14/2018 1947   HGBUR SMALL (A) 03/14/2018 [place] NEGATIVE 03/14/2018 1947   KETONESUR 20 (A) 03/14/2018 [place] NEGATIVE 03/14/2018 1947   NITRITE NEGATIVE 03/14/2018 1947    LEUKOCYTESUR NEGATIVE 03/14/2018 1947   Sepsis Labs: @LABRCNTIP (procalcitonin:4,lacticidven:4) ) Recent Results (from the past 240 hour(s))  Culture, blood (routine x 2)     Status: None (Preliminary result)   Collection Time: 03/14/18  7:04 PM  Result Value Ref Range Status   Specimen Description   Final    BLOOD BLOOD RIGHT FOREARM Performed at [facility], [place] [address] [place], [place] [address]    Special Requests   Final    BOTTLES DRAWN AEROBIC AND ANAEROBIC Blood Culture adequate volume Performed at [place] [address], [place], [place] [address]    Culture   Final    NO GROWTH 4 DAYS Performed at [facility], [place] [address] [place], [place] [address]    Report Status PENDING  Incomplete  Culture, blood (routine x 2)     Status: None (Preliminary result)   Collection Time: 03/14/18  7:09 PM  Result Value Ref Range Status   Specimen Description   Final    BLOOD BLOOD RIGHT FOREARM Performed at [place] [address], [place], [place] [address]    Special Requests   Final    BOTTLES DRAWN AEROBIC AND ANAEROBIC Blood Culture adequate volume Performed at [place] [address], [place], [place] [address]    Culture   Final    NO GROWTH 4 DAYS Performed at [facility], [place] [address] [place],  [address]    Report Status PENDING  Incomplete  Urine culture     Status: Abnormal   Collection Time: 03/14/18  7:47 PM  Result  Value Ref Range Status   Specimen Description   Final    URINE, CLEAN CATCH Performed at [facility], [place] [address], [place], [place] [address]    Special Requests   Final    Normal Performed at [facility], [place] [address] [place], [place] [address]    Culture MULTIPLE SPECIES PRESENT, SUGGEST RECOLLECTION (A)  Final   Report Status 03/16/2018 FINAL  Final  C difficile quick scan w PCR  reflex     Status: Abnormal   Collection Time: 03/16/18 11:17 AM  Result Value Ref Range Status   C Diff antigen POSITIVE (A) NEGATIVE Final   C Diff toxin NEGATIVE NEGATIVE Final   C Diff interpretation Results are indeterminate. See PCR results.  Final    Comment: Performed at [facility], [place] [address], [place], [place] [address]  C. Diff by PCR, Reflexed     Status: Abnormal   Collection Time: 03/16/18 11:17 AM  Result Value Ref Range Status   Toxigenic C. Difficile by PCR POSITIVE (A) NEGATIVE Final    Comment: Positive for toxigenic C. difficile with little to no toxin production. Only treat if clinical presentation suggests symptomatic illness. Performed at [facility], [place] [address], [place], [place] [address]          Radiology Studies: Dg Abd 1 View  Result Date: 03/18/2018 CLINICAL DATA:  NG tube placement EXAM: ABDOMEN - 1 VIEW COMPARISON:  03/18/2018, earlier the same day. FINDINGS: NG tube is looped in the stomach. Bowel gas pattern is within normal limits. Lung bases show diffuse interstitial opacity with patchy airspace disease in the retrocardiac left base. IMPRESSION: 1. NG tube tip is in the stomach. 2. Diffuse interstitial and left basilar airspace disease. Electronically Signed   By: Misty Stanley M.D.   On: 03/18/2018 18:31   Mr Brain Wo Contrast  Result Date: 03/18/2018 CLINICAL DATA:  62 y/o  M; encephalopathy. EXAM: MRI HEAD WITHOUT CONTRAST TECHNIQUE: Multiplanar, multiecho pulse sequences of the brain and surrounding structures were obtained without intravenous contrast. COMPARISON:  03/14/2018 CT head FINDINGS: Brain: Severe motion degradation of multiple sequences. No gross reduced diffusion to suggest acute or early subacute infarct. Small chronic infarct within the left hemi pons. Moderate diffuse brain parenchymal volume loss. No herniation. Diffuse smooth pachymeningeal thickening with T1 isointense and T2 hyperintense signal  and incomplete FLAIR suppression greater over the right cerebral hemisphere than left. Vascular: Normal flow voids. Skull and upper cervical spine: Normal marrow signal. Sinuses/Orbits: Negative. Other: None. IMPRESSION: 1. Motion degraded study. 2. New diffuse smooth dural thickening over the cerebral convexities. No significant mass effect. Findings probably represent thin subdural hematomas or intracranial hypotension, less likely pachymeningitis. 3. Small chronic left hemi pons infarction. Moderate brain parenchymal volume loss. These results will be called to the ordering clinician or representative by the Radiologist Assistant, and communication documented in the PACS or zVision Dashboard. Electronically Signed   By: Kristine Garbe M.D.   On: 03/18/2018 21:09   Dg Chest Port 1 View  Result Date: 03/19/2018 CLINICAL DATA:  Pneumonia.  History of smoking. EXAM: PORTABLE CHEST 1 VIEW COMPARISON:  Chest x-rays dated 03/17/2018, 03/14/2018 and 09/09/2017. FINDINGS: The diffuse bilateral interstitial prominence is stable compared to the most recent chest x-ray of 03/17/2018, new compared to the earlier chest x-rays, presumed interstitial edema. No evidence of consolidating pneumonia. No pleural effusion or pneumothorax seen. Heart size and mediastinal contours are stable. No acute  or suspicious osseous finding. IMPRESSION: No interval change. Persistent bilateral interstitial prominence, presumably edema. No evidence of consolidating pneumonia. Electronically Signed   By: Franki Cabot M.D.   On: 03/19/2018 13:51   Dg Abd Portable 1v  Result Date: 03/18/2018 CLINICAL DATA:  Nasogastric tube placement EXAM: PORTABLE ABDOMEN - 1 VIEW COMPARISON:  Portable exam at 1417 hrs compared to 03/17/2018 FINDINGS: Nasogastric tube is coiled in the stomach at the distal antrum with the tip reflected to the proximal stomach above the extent of the image. Normal bowel gas pattern. Bones demineralized. Extensive  atherosclerotic calcifications. IMPRESSION: Nasogastric tube is coiled at the distal stomach with tip reflected into the proximal stomach cranial to the superior extent of this abdominal radiograph. Electronically Signed   By: Lavonia Dana M.D.   On: 03/18/2018 14:56   Dg Addison Bailey G Tube Plc W/fl W/rad  Result Date: 03/19/2018 CLINICAL DATA:  Dysphagia.  NG tube required for clinical care. EXAM: NASO G TUBE PLACEMENT WITH FL AND WITH RAD CONTRAST:  None. FLUOROSCOPY TIME:  Fluoroscopy Time:  0 minutes and 11 seconds. Radiation Exposure Index (if provided by the fluoroscopic device): 2.1 mGy Number of Acquired Spot Images: 0 COMPARISON:  03/17/2018. FINDINGS: Patient arrived in radiology quite agitated. Multiple technologists were required to assist with patient positioning. During attempted tube placement, the patient was bearing down, performing Valsalva maneuver, and moving extensively. Despite multiple attempts using both nostrils, the NG tube only entered the trachea. NG tube could not be successfully maneuvered into the esophagus. IMPRESSION: Unsuccessful attempt at fluoroscopically guided NG tube placement. Electronically Signed   By: Misty Stanley M.D.   On: 03/19/2018 16:55   [place] Ekg Site Rite  Result Date: 03/19/2018 If Site Rite image not attached, placement could not be confirmed due to current cardiac rhythm.     Scheduled Meds: . ferrous sulfate  300 mg Per Tube TID WC  . folic acid  1 mg Oral Daily  . multivitamin  15 mL Per Tube Daily  . nicotine  14 mg Transdermal Daily  . potassium & sodium phosphates  1 packet Per NG tube TID WC & HS  . sodium chloride flush  10-40 mL Intracatheter Q12H  . [START ON 03/22/2018] thiamine  100 mg Oral Daily   Or  . [START ON 03/22/2018] thiamine  100 mg Intravenous Daily  . vancomycin  125 mg Oral QID  . vancomycin (VANCOCIN) rectal ENEMA  500 mg Rectal Q6H   Continuous Infusions: . dextrose 5 % and 0.45 % NaCl with KCl 40 mEq/L 100 mL/hr at  03/20/18 1103  . famotidine (PEPCID) IV Stopped (03/20/18 1226)  . feeding supplement (OSMOLITE 1.5 CAL) Stopped (03/19/18 0200)  . metronidazole Stopped (03/20/18 1229)  . potassium PHOSPHATE IVPB (mEq)    . sodium phosphate  Dextrose 5% IVPB       LOS: 6 days    Time spent in minutes: 35    Debbe Odea, MD [facility] Pager: [email or web address] Password [email or web address] 03/20/2018, 2:15 PM

## 2018-03-20 NOTE — Progress Notes (Addendum)
Pt has refused PO medications. The nurse has been able to give the vancomycin with cran juice, with great difficulty. He has been reluctant with drinking the mixture but did take it.

## 2018-03-20 NOTE — Progress Notes (Signed)
Patient was scheduled rectal vanc throughout the night last night - I paged doctor on -call to see about putting in a flexi - on call doctor stated per orders from MD, do not put in a flexi due to low platelets and some other things going on with patient - I highly recommended flexi because skin is severly red and starting to break down due to the amount of loose stools - also, patient was not given the vanc due to him having loose stools and not being able to hold the vanc in - my hopes was to do the flexi to at least try and get most of the vanc deeper than just a standard enema which my concern with the enema as well as the doctor on call was possible bleeding during insertion. Patient also had a lot of gargling going on in his chest so I did obtain a prn order for deep suction through respiratory services which was successful last night and patient was able to sleep after the deep suction. Patient would truly benefit from the flexi if at all possible - passed this on to daytime nurse.

## 2018-03-21 DIAGNOSIS — Z4659 Encounter for fitting and adjustment of other gastrointestinal appliance and device: Secondary | ICD-10-CM

## 2018-03-21 LAB — CBC
HCT: 32.1 % — ABNORMAL LOW (ref 39.0–52.0)
Hemoglobin: 10.6 g/dL — ABNORMAL LOW (ref 13.0–17.0)
MCH: 27.1 pg (ref 26.0–34.0)
MCHC: 33 g/dL (ref 30.0–36.0)
MCV: 82.1 fL (ref 78.0–100.0)
PLATELETS: 71 10*3/uL — AB (ref 150–400)
RBC: 3.91 MIL/uL — ABNORMAL LOW (ref 4.22–5.81)
RDW: 28.5 % — AB (ref 11.5–15.5)
WBC: 14.3 10*3/uL — ABNORMAL HIGH (ref 4.0–10.5)

## 2018-03-21 LAB — COMPREHENSIVE METABOLIC PANEL
ALBUMIN: 2.2 g/dL — AB (ref 3.5–5.0)
ALT: 44 U/L (ref 0–44)
AST: 36 U/L (ref 15–41)
Alkaline Phosphatase: 152 U/L — ABNORMAL HIGH (ref 38–126)
Anion gap: 6 (ref 5–15)
CHLORIDE: 114 mmol/L — AB (ref 98–111)
CO2: 19 mmol/L — AB (ref 22–32)
Calcium: 7.4 mg/dL — ABNORMAL LOW (ref 8.9–10.3)
GLUCOSE: 95 mg/dL (ref 70–99)
Potassium: 3.7 mmol/L (ref 3.5–5.1)
SODIUM: 139 mmol/L (ref 135–145)
Total Bilirubin: 3.3 mg/dL — ABNORMAL HIGH (ref 0.3–1.2)
Total Protein: 5.4 g/dL — ABNORMAL LOW (ref 6.5–8.1)

## 2018-03-21 LAB — HIV ANTIBODY (ROUTINE TESTING W REFLEX): HIV Screen 4th Generation wRfx: NONREACTIVE

## 2018-03-21 LAB — GLUCOSE, CAPILLARY
GLUCOSE-CAPILLARY: 95 mg/dL (ref 70–99)
Glucose-Capillary: 106 mg/dL — ABNORMAL HIGH (ref 70–99)
Glucose-Capillary: 74 mg/dL (ref 70–99)
Glucose-Capillary: 79 mg/dL (ref 70–99)
Glucose-Capillary: 80 mg/dL (ref 70–99)
Glucose-Capillary: 87 mg/dL (ref 70–99)

## 2018-03-21 LAB — PHOSPHORUS: Phosphorus: 2.1 mg/dL — ABNORMAL LOW (ref 2.5–4.6)

## 2018-03-21 LAB — RPR: RPR Ser Ql: NONREACTIVE

## 2018-03-21 MED ORDER — SODIUM CHLORIDE 0.9 % IV BOLUS
500.0000 mL | Freq: Once | INTRAVENOUS | Status: AC
  Administered 2018-03-21: 500 mL via INTRAVENOUS

## 2018-03-21 MED ORDER — ENSURE ENLIVE PO LIQD
237.0000 mL | Freq: Three times a day (TID) | ORAL | Status: DC
  Administered 2018-03-21 – 2018-03-30 (×17): 237 mL via ORAL

## 2018-03-21 NOTE — Progress Notes (Signed)
Subjective: Significantly improved since Friday.   He denies orthostatic headache.   Exam: Vitals:   03/20/18 2329 03/21/18 0500  BP: 126/69 130/64  Pulse: 88 92  Resp: 18 20  Temp: 98.9 F (37.2 C) 98.8 F (37.1 C)  SpO2: 100% 100%   Gen: In bed, appears cachectic.  Resp: non-labored breathing, no acute distress Abd: soft, nt  Neuro: MS: awake, alert, interactive, gives month as December, year as 2018 CN:VFF, face symmetric, eomi Motor: 5/5 throughout Sensory:intact to LT  Pertinent Labs: cmp - unremarkable  Impression: 62 yo M with encephaloapthy in the setting of Cdiff infection.  I suspect that his encephalopathy was due to the infection, and would expect it to continue to gradually improve.  His nutritional status may have played a role, and I would favor continuing thiamine supplementation as well, 100 milligrams per day.  I suspect that the thickened meninges are incidental, however with his chronic weight loss, etc, I would favor ruling out a more insidious cause.  If LP is negative, then I would just favor following this with imaging.   Recommendations: 1) LP for cells, culture, Gram stain, protein, glucose, fungus culture, fungal smear, cytology.   2) if this is negative, then I would favor continue supportive care therapy for his encephalopathy but would not have any further inpatient recommendations.   Roland Rack, MD [facility] [phone]  If 7pm- 7am, please page neurology on call as listed in [place].

## 2018-03-21 NOTE — Progress Notes (Signed)
PROGRESS NOTE    James [name]   QQI:[id number]  DOB: 1955/10/29  DOA: 03/14/2018 PCP: Denita Lung, MD   Brief Narrative:  James [name] 62 y.o. male past medical history recurrent severe anemia requiring multiple transfusions due to AVMs with chronic blood loss, alcohol abuse cachectic comes in for near weakness, near syncope and was found to have a hemoglobin of 2. Noted to be confused in the ED with Temp of 91.2, BP 92/54, WBC 43.000.  Admitted for blood transfusions and started on IV antibiotics for possibly sepsis.    Subjective:  Wife and brother at bedside. The patient still states he does not want to eat anyting and his wife is coaxing him to eat and drink. He has not complaints.   Assessment & Plan:   Principal Problem:   Symptomatic Anemia  - Hb of 2 may have been inaccurate as after 3 U Hb was 9 - has h/o AVMs - he told another physician that he has bright red blood in stool and has not been compliant with Iron tabs - FOB negative X 2  - s/p 6 U PRBC and Hb now stable ~ 10- 11.  Active Problems: Sepsis due to C diff colitis - started on Cefepime initially for sepsis of unknown origin- blood cultures have been negative - he has been having diarrhea  - c diff Antigen was +, toxin was Negative and PCR is + - 7/10-   NG tube placed as patient was not alert enough to take oral vancomycin- NG tube entered the lungs and then had to be replaced by IR- Vancomycin eventually was started- stools are no longer runny per RN -7/11-  NG was not able to be flushed, xray showed it to be curled and hitting the gastric wall, it was pulled back and was able to be used again by 7/11 evening - 7/12 at 2 AM, pulled NG and IR could not replace it - 7/13 more alert today- he did take about 3/4 of the oral Vancomycin- WBC down to 13.8 - 7/14- taking Vanc- per RN, diarrhea is not severe anymore- advance to soft diet- wife and bother advised to bring food from outside if he will eat  it  Basilar infiltrates - CXR after NG was placed in patient's right lung showed b/l interstitial edema vs basilar consolidation - repeat CXR is more consistent with edema rather than pneumonia - he does not appear fluid overloaded and is not hypoxic therefore I will hold off on giving diuretics  Hypokalemia/ hypophosphatemia - give KCL and K phos    Acute toxic encephalopathy - ? Delirium initially due to alcohol withdrawal- last dose of Ativan was on 7/10 - cont Thiamine and IVF with D5 - MRI has been pending for days- he was too agitated for it to be done despite Ativan on 7/9 -  MRI finally obtained on 7/11 showed mild bilateral subdural hematomas vs intracranial hypotension vs pachymeningitis- I have consulted Neuro- he is still not awakening  - LP ordered by neuro - becoming more alert now and is more communicative when his family is around     Lactic acidosis - severe due to severe anemia and alcohol abuse in setting of liver disease - lactic acid of 17.8 initially and then 4.0     Tobacco abuse - Nicotine patch    Alcohol abuse and withdrawal - withdrawal symptoms resolved - cont  folic acid and thiamine    Severe protein calorie malnutrition -  NG tube unable to be replaced after he pulled it - encourage oral intake - add Ensure  Thrombocytopenia - likely due to ETOH abuse   Anemia - normal Iron levels but Ferritin is low- will give Iron infusion prior to d/c home  - Folate and B12  normal  DVT prophylaxis: SCDs Code Status: Full code Family Communication:   Disposition Plan:  Treat C diff colitis and encourage oral intake- will likely need to go to SNF Consultants:    Neuro Procedures:    NG tube x 2 Antimicrobials:  Anti-infectives (From admission, onward)   Start     Dose/Rate Route Frequency Ordered Stop   03/21/18 0000  vancomycin (VANCOCIN) 50 mg/mL oral solution 125 mg     125 mg Oral Every 6 hours 03/20/18 2136 03/28/18 2359   03/21/18 0000   vancomycin (VANCOCIN) 500 mg in sodium chloride irrigation 0.9 % 100 mL ENEMA     500 mg Rectal Every 6 hours 03/20/18 2136 03/28/18 2359   03/19/18 2000  vancomycin (VANCOCIN) 500 mg in sodium chloride irrigation 0.9 % 100 mL ENEMA  Status:  Discontinued     500 mg Rectal Every 6 hours 03/19/18 1626 03/20/18 2136   03/19/18 1630  metroNIDAZOLE (FLAGYL) IVPB 500 mg     500 mg 100 mL/hr over 60 Minutes Intravenous Every 8 hours 03/19/18 1626     03/17/18 1000  vancomycin (VANCOCIN) 50 mg/mL oral solution 125 mg  Status:  Discontinued     125 mg Oral 4 times daily 03/17/18 0739 03/20/18 2136   03/15/18 0800  ceFEPIme (MAXIPIME) 1 g in sodium chloride 0.9 % 100 mL IVPB  Status:  Discontinued     1 g 200 mL/hr over 30 Minutes Intravenous Every 8 hours 03/15/18 0603 03/17/18 0737   03/15/18 0130  ceFEPIme (MAXIPIME) 2 g in sodium chloride 0.9 % 100 mL IVPB     2 g 200 mL/hr over 30 Minutes Intravenous  Once 03/15/18 0117 03/15/18 0220       Objective: Vitals:   03/20/18 1259 03/20/18 2329 03/21/18 0500 03/21/18 1345  BP: (!) 110/91 126/69 130/64 122/63  Pulse: 100 88 92 82  Resp: 20 18 20 16   Temp: 98.1 F (36.7 C) 98.9 F (37.2 C) 98.8 F (37.1 C) 97.8 F (36.6 C)  TempSrc: Oral Axillary Axillary Oral  SpO2:  100% 100% 98%  Weight:   52 kg (114 lb 10.2 oz)   Height:        Intake/Output Summary (Last 24 hours) at 03/21/2018 1425 Last data filed at 03/21/2018 0554 Gross per 24 hour  Intake 3056.42 ml  Output 500 ml  Net 2556.42 ml   Filed Weights   03/18/18 1955 03/20/18 0636 03/21/18 0500  Weight: 46 kg (101 lb 6.6 oz) 53.4 kg (117 lb 11.6 oz) 52 kg (114 lb 10.2 oz)    Examination: General exam: Appears comfortable - malnourished HEENT: PERRLA, oral mucosa moist, no sclera icterus or thrush Respiratory system: Clear to auscultation. Respiratory effort normal. Cardiovascular system: S1 & S2 heard, RRR.   Gastrointestinal system: Abdomen soft, non-tender, nondistended.  Normal bowel sound. No organomegaly Central nervous system: alert, moves arms and legs Extremities: No cyanosis, clubbing or edema Skin: No rashes or ulcers   Data Reviewed: I have personally reviewed following labs and imaging studies  CBC: Recent Labs  Lab 03/14/18 2035 03/15/18 0843  03/16/18 0719 03/18/18 0427 03/19/18 0612 03/20/18 0612 03/21/18 0500  WBC 43.2* 29.1*   < >  19.7* 19.0* 18.9* 13.8* 14.3*  NEUTROABS 39.7* 27.9*  --  17.9*  --   --   --   --   HGB 2.1* 9.1*   < > 11.6* 10.3* 10.5* 10.4* 10.6*  HCT 9.0* 29.8*   < > 34.4* 30.5* 31.5* 31.5* 32.1*  MCV 63.4* 80.3   < > 78.5 77.0* 79.9 81.6 82.1  PLT 111* 96*   < > 78* 80* 72* 67* 71*   < > = values in this interval not displayed.   Basic Metabolic Panel: Recent Labs  Lab 03/17/18 0352 03/18/18 0427 03/19/18 0612 03/20/18 0612 03/20/18 1205 03/21/18 0500 03/21/18 0555  NA 139 139 139 137  --  139  --   K 3.0* 3.2* 2.8* 3.6  --  3.7  --   CL 111 110 111 114*  --  114*  --   CO2 20* 21* 22 19*  --  19*  --   GLUCOSE 110* 125* 134* 114*  --  95  --   BUN 7 <5* <5* <5*  --  <5*  --   CREATININE 0.38* 0.34* 0.34* 0.33*  --  <0.30*  --   CALCIUM 8.7* 9.0 8.2* 7.8*  --  7.4*  --   MG  --  1.8  --   --   --   --   --   PHOS  --  <1.0* <1.0*  --  0.7*  --  2.1*   GFR: CrCl cannot be calculated (This lab value cannot be used to calculate CrCl because it is not a number: <0.30). Liver Function Tests: Recent Labs  Lab 03/14/18 1849 03/21/18 0500  AST 64* 36  ALT 34 44  ALKPHOS 167* 152*  BILITOT 1.7* 3.3*  PROT 7.4 5.4*  ALBUMIN 2.9* 2.2*   Recent Labs  Lab 03/14/18 1849  LIPASE 27   No results for input(s): AMMONIA in the last 168 hours. Coagulation Profile: No results for input(s): INR, PROTIME in the last 168 hours. Cardiac Enzymes: Recent Labs  Lab 03/14/18 2035  TROPONINI <0.03   BNP (last 3 results) No results for input(s): PROBNP in the last 8760 hours. HbA1C: No results for  input(s): HGBA1C in the last 72 hours. CBG: Recent Labs  Lab 03/20/18 1628 03/20/18 1942 03/21/18 0005 03/21/18 0352 03/21/18 0901  GLUCAP 98 99 87 74 80   Lipid Profile: No results for input(s): CHOL, HDL, LDLCALC, TRIG, CHOLHDL, LDLDIRECT in the last 72 hours. Thyroid Function Tests: No results for input(s): TSH, T4TOTAL, FREET4, T3FREE, THYROIDAB in the last 72 hours. Anemia Panel: No results for input(s): VITAMINB12, FOLATE, FERRITIN, TIBC, IRON, RETICCTPCT in the last 72 hours. Urine analysis:    Component Value Date/Time   COLORURINE YELLOW 03/14/2018 1947   APPEARANCEUR CLEAR 03/14/2018 1947   LABSPEC 1.009 03/14/2018 1947   PHURINE 5.0 03/14/2018 1947   GLUCOSEU NEGATIVE 03/14/2018 1947   HGBUR SMALL (A) 03/14/2018 [place] NEGATIVE 03/14/2018 1947   KETONESUR 20 (A) 03/14/2018 [place] NEGATIVE 03/14/2018 1947   NITRITE NEGATIVE 03/14/2018 1947   LEUKOCYTESUR NEGATIVE 03/14/2018 1947   Sepsis Labs: @LABRCNTIP (procalcitonin:4,lacticidven:4) ) Recent Results (from the past 240 hour(s))  Culture, blood (routine x 2)     Status: None   Collection Time: 03/14/18  7:04 PM  Result Value Ref Range Status   Specimen Description   Final    BLOOD BLOOD RIGHT FOREARM Performed at [facility], [place] [address] [place], [place]  [address]    Special Requests   Final    BOTTLES DRAWN AEROBIC AND ANAEROBIC Blood Culture adequate volume Performed at [place] [address] [place], [place] [address]    Culture   Final    NO GROWTH 5 DAYS Performed at [facility], [place] [address] [place], [place] [address]    Report Status 03/20/2018 FINAL  Final  Culture, blood (routine x 2)     Status: None   Collection Time: 03/14/18  7:09 PM  Result Value Ref Range Status   Specimen Description   Final    BLOOD BLOOD RIGHT FOREARM Performed at [place] [address] [place], [place]  [address]    Special Requests   Final    BOTTLES DRAWN AEROBIC AND ANAEROBIC Blood Culture adequate volume Performed at [place] [address] [place], [place] [address]    Culture   Final    NO GROWTH 5 DAYS Performed at [facility], [place] [address] [place], [place] [address]    Report Status 03/20/2018 FINAL  Final  Urine culture     Status: Abnormal   Collection Time: 03/14/18  7:47 PM  Result Value Ref Range Status   Specimen Description   Final    URINE, CLEAN CATCH Performed at [facility], [place] [address], [place], [place] [address]    Special Requests   Final    Normal Performed at [facility], [place] [address] [place], [place] [address]    Culture MULTIPLE SPECIES PRESENT, SUGGEST RECOLLECTION (A)  Final   Report Status 03/16/2018 FINAL  Final  C difficile quick scan w PCR reflex     Status: Abnormal   Collection Time: 03/16/18 11:17 AM  Result Value Ref Range Status   C Diff antigen POSITIVE (A) NEGATIVE Final   C Diff toxin NEGATIVE NEGATIVE Final   C Diff interpretation Results are indeterminate. See PCR results.  Final    Comment: Performed at [facility], [place] [address] [place], [place] [address]  C. Diff by PCR, Reflexed     Status: Abnormal   Collection Time: 03/16/18 11:17 AM  Result Value Ref Range Status   Toxigenic C. Difficile by PCR POSITIVE (A) NEGATIVE Final    Comment: Positive for toxigenic C. difficile with little to no toxin production. Only treat if clinical presentation suggests symptomatic illness. Performed at [facility], [place] [address], [place],  [address]          Radiology Studies: Dg Loyce Dys Tube Plc W/fl W/rad  Result Date: 03/19/2018 CLINICAL DATA:  Dysphagia.  NG tube required for clinical care. EXAM: NASO G TUBE PLACEMENT WITH FL AND WITH RAD CONTRAST:  None. FLUOROSCOPY TIME:  Fluoroscopy Time:  0 minutes and 11 seconds.  Radiation Exposure Index (if provided by the fluoroscopic device): 2.1 mGy Number of Acquired Spot Images: 0 COMPARISON:  03/17/2018. FINDINGS: Patient arrived in radiology quite agitated. Multiple technologists were required to assist with patient positioning. During attempted tube placement, the patient was bearing down, performing Valsalva maneuver, and moving extensively. Despite multiple attempts using both nostrils, the NG tube only entered the trachea. NG tube could not be successfully maneuvered into the esophagus. IMPRESSION: Unsuccessful attempt at fluoroscopically guided NG tube placement. Electronically Signed   By: Misty Stanley M.D.   On: 03/19/2018 16:55   [place] Ekg Site Rite  Result Date: 03/19/2018 If Site  Rite image not attached, placement could not be confirmed due to current cardiac rhythm.     Scheduled Meds: . folic acid  1 mg Intravenous Daily  . multivitamin  15 mL Per Tube Daily  . nicotine  14 mg Transdermal Daily  . potassium & sodium phosphates  1 packet Per NG tube TID WC & HS  . sodium chloride flush  10-40 mL Intracatheter Q12H  . [START ON 03/22/2018] thiamine  100 mg Oral Daily   Or  . [START ON 03/22/2018] thiamine  100 mg Intravenous Daily  . vancomycin  125 mg Oral Q6H   Or  . vancomycin (VANCOCIN) rectal ENEMA  500 mg Rectal Q6H   Continuous Infusions: . dextrose 5 % and 0.45 % NaCl with KCl 40 mEq/L 125 mL/hr at 03/21/18 1113  . famotidine (PEPCID) IV Stopped (03/21/18 1040)  . metronidazole Stopped (03/21/18 1110)  . potassium PHOSPHATE IVPB (mEq)       LOS: 7 days    Time spent in minutes: 35    Debbe Odea, MD [facility] Pager: [email or web address] Password [email or web address] 03/21/2018, 2:25 PM

## 2018-03-21 NOTE — Plan of Care (Signed)
  Problem: Elimination: Goal: Will not experience complications related to bowel motility Outcome: Not Progressing    Patient continues to have loose BM's. He is now allowing RN's to give oral vancomycin for cdiff.

## 2018-03-22 LAB — GLUCOSE, CAPILLARY
GLUCOSE-CAPILLARY: 120 mg/dL — AB (ref 70–99)
GLUCOSE-CAPILLARY: 119 mg/dL — AB (ref 70–99)
GLUCOSE-CAPILLARY: 98 mg/dL (ref 70–99)
Glucose-Capillary: 98 mg/dL (ref 70–99)
Glucose-Capillary: 96 mg/dL (ref 70–99)
Glucose-Capillary: 79 mg/dL (ref 70–99)

## 2018-03-22 LAB — BASIC METABOLIC PANEL
ANION GAP: 7 (ref 5–15)
BUN: <5 mg/dL — ABNORMAL LOW (ref 6–20)
CHLORIDE: 110 mmol/L (ref 98–111)
CO2: 18 mmol/L — AB (ref 22–32)
Calcium: 7.2 mg/dL — ABNORMAL LOW (ref 8.9–10.3)
Creatinine, Ser: 0.34 mg/dL — ABNORMAL LOW (ref 0.61–1.24)
GFR calc non Af Amer: >60 mL/min (ref >60)
GLUCOSE: 103 mg/dL — AB (ref 70–99)
Potassium: 3.3 mmol/L — ABNORMAL LOW (ref 3.5–5.1)
Sodium: 135 mmol/L (ref 135–145)

## 2018-03-22 LAB — CBC
HCT: 29.8 % — ABNORMAL LOW (ref 39.0–52.0)
HEMOGLOBIN: 9.9 g/dL — AB (ref 13.0–17.0)
MCH: 27.5 pg (ref 26.0–34.0)
MCHC: 33.2 g/dL (ref 30.0–36.0)
MCV: 82.8 fL (ref 78.0–100.0)
Platelets: 77 10*3/uL — ABNORMAL LOW (ref 150–400)
RBC: 3.6 MIL/uL — AB (ref 4.22–5.81)
RDW: 30.1 % — ABNORMAL HIGH (ref 11.5–15.5)
WBC: 14.1 10*3/uL — ABNORMAL HIGH (ref 4.0–10.5)

## 2018-03-22 LAB — PHOSPHORUS
PHOSPHORUS: 1.5 mg/dL — AB (ref 2.5–4.6)
Phosphorus: 0.7 mg/dL — CL (ref 2.5–4.6)

## 2018-03-22 LAB — ANGIOTENSIN CONVERTING ENZYME

## 2018-03-22 MED ORDER — POTASSIUM CHLORIDE CRYS ER 20 MEQ PO TBCR
40.0000 meq | EXTENDED_RELEASE_TABLET | ORAL | Status: AC
  Administered 2018-03-22 (×2): 40 meq via ORAL
  Filled 2018-03-22 (×2): qty 2

## 2018-03-22 MED ORDER — POTASSIUM PHOSPHATES 15 MMOLE/5ML IV SOLN
10.0000 mmol | Freq: Once | INTRAVENOUS | Status: AC
  Administered 2018-03-22: 10 mmol via INTRAVENOUS
  Filled 2018-03-22: qty 3.33

## 2018-03-22 MED ORDER — FOLIC ACID 1 MG PO TABS
1.0000 mg | ORAL_TABLET | Freq: Every day | ORAL | Status: DC
  Administered 2018-03-23 – 2018-03-27 (×5): 1 mg via ORAL
  Filled 2018-03-22 (×5): qty 1

## 2018-03-22 MED ORDER — METRONIDAZOLE 500 MG PO TABS
500.0000 mg | ORAL_TABLET | Freq: Three times a day (TID) | ORAL | Status: DC
  Administered 2018-03-22 – 2018-03-23 (×3): 500 mg via ORAL
  Filled 2018-03-22 (×3): qty 1

## 2018-03-22 MED ORDER — POTASSIUM CHLORIDE CRYS ER 20 MEQ PO TBCR: 40.0000 meq | EXTENDED_RELEASE_TABLET | ORAL | Status: DC

## 2018-03-22 MED ORDER — FAMOTIDINE 20 MG PO TABS
20.0000 mg | ORAL_TABLET | Freq: Two times a day (BID) | ORAL | Status: DC
Start: 2018-03-22 — End: 2018-03-24
  Administered 2018-03-22 – 2018-03-24 (×4): 20 mg via ORAL
  Filled 2018-03-22 (×4): qty 1

## 2018-03-22 NOTE — Progress Notes (Signed)
Pharmacy IV to PO conversion  This patient is receiving metronidazole by the intravenous route. Based on criteria approved by the Pharmacy and Therapeutics Committee, and the Infectious Disease Division, the antibiotic(s) is/are being converted to equivalent oral dose form(s). These criteria include:   Patient being treated for a respiratory tract infection, urinary tract infection, cellulitis, or Clostridium Difficile Associated Diarrhea  The patient is not neutropenic and does not exhibit a GI malabsorption state  The patient is eating (either orally or per tube) and/or has been taking other orally administered medications for at least 24 hours.  The patient is improving clinically (physician assessment and a 24-hour Tmax of <=100.5 F)  Additionally, the patient is receiving folate, famotidine, and thiamine by the intravenous route. Criteria for IV-PO conversions for these medications are as follows:   No active GI bleeding or impaired absorption  Not s/p esophagectomy  Documented ability to take oral medications for > 24 hr  Plan to continue treatment for at least 1 day  If you have any questions about this conversion, please contact the Pharmacy Department (ext [phone]).  Thank you.  Reuel Boom, PharmD, BCPS [phone] 03/22/2018, 1:28 PM    Pharmacy IV to PO conversion     If you have any questions about this conversion, please contact the Pharmacy Department (ext [phone]).  Thank you.  Reuel Boom, PharmD, BCPS [phone] 03/22/2018, 1:28 PM

## 2018-03-22 NOTE — Progress Notes (Signed)
  Speech Language Pathology Treatment: Dysphagia  Patient Details Name: James [name] MRN: [id number] DOB: [birth year] Today's Date: 03/22/2018 Time: 4696-2952 SLP Time Calculation (min) (ACUTE ONLY): 12 min  Assessment / Plan / Recommendation Clinical Impression  Pt's diet was initiated and advanced by MD. He seems to be more alert than during previous SLP visits, but he remains confused. Upon SLP arrival his voice is wet in quality and his speech is dysarthric, although his vocal quality did clear with cued cough. Pt needed encouragement to try a few sips of thin liquid, which elicited multiple, audible subswallows and delayed throat clear, but without changes in vocal quality. Pt declined other POs offered. Pt still presents with signs of dysphagia with concern for aspiration and would likely benefit from MBS to better assess oropharyngeal function. Will defer diet in the meantime to MD recommendation given limited observation with pt at bedside. He does remain afebrile and with clear lung sounds per chart review. Would provide full supervision and hold POs if coughing or wet vocal quality is noted.    HPI HPI: 62 yo male adm to [facility] with symptomatic anemia, PMH + for etoh use, cachexia, former smoker, Hemoglobin 2 upon admit.  Swallow eval ordered due to pt having more difficulty.        SLP Plan  MBS       Recommendations  Diet recommendations: (diet upgraded per MD) Medication Administration: Crushed with puree Supervision: Staff to assist with self feeding;Full supervision/cueing for compensatory strategies Compensations: Slow rate;Small sips/bites Postural Changes and/or Swallow Maneuvers: Seated upright 90 degrees;Upright 30-60 min after meal                Oral Care Recommendations: Oral care BID Follow up Recommendations: 24 hour supervision/assistance SLP Visit Diagnosis: Dysphagia, unspecified (R13.10) Plan: MBS       GO                Germain Osgood 03/22/2018, 3:59 PM  Germain Osgood, M.A. CCC-SLP [phone]

## 2018-03-22 NOTE — Progress Notes (Signed)
PROGRESS NOTE    James [name]   IEP:[id number]  DOB: 13-Apr-1956  DOA: 03/14/2018 PCP: Denita Lung, MD   Brief Narrative:  James [name] 62 y.o. male past medical history recurrent severe anemia requiring multiple transfusions due to AVMs with chronic blood loss, alcohol abuse cachectic comes in for near weakness, near syncope and was found to have a hemoglobin of 2. Noted to be confused in the ED with Temp of 91.2, BP 92/54, WBC 43.000.  Admitted for blood transfusions and started on IV antibiotics for possibly sepsis.    Subjective: He has no complaints and is now eating and drinking. RN states diarrhea is slowing down.   Assessment & Plan:   Principal Problem:   Symptomatic Anemia  - Hb of 2 may have been inaccurate as after 3 U Hb was 9 - has h/o AVMs - he told another physician that he has bright red blood in stool and has not been compliant with Iron tabs - FOB negative X 2  - s/p 6 U PRBC and Hb now stable ~ 10- 11.  Active Problems: Sepsis due to C diff colitis - started on Cefepime initially for sepsis of unknown origin- blood cultures have been negative - he has been having diarrhea  - c diff Antigen was +, toxin was Negative and PCR is + - 7/10-   NG tube placed as patient was not alert enough to take oral vancomycin- NG tube entered the lungs and then had to be replaced by IR- Vancomycin eventually was started- stools are no longer runny per RN -7/11-  NG was not able to be flushed, xray showed it to be curled and hitting the gastric wall, it was pulled back and was able to be used again by 7/11 evening - 7/12 at 2 AM, pulled NG and IR could not replace it - 7/13 more alert today- he did take about 3/4 of the oral Vancomycin- WBC down to 13.8 - 7/14- taking Vanc- per RN, diarrhea is not severe anymore- advance to soft diet- wife and bother advised to bring food from outside if he will eat it   Basilar infiltrates - CXR after NG was placed in patient's right  lung showed b/l interstitial edema vs basilar consolidation - repeat CXR is more consistent with edema rather than pneumonia - he does not appear fluid overloaded and is not hypoxic therefore I will hold off on giving diuretics  Hypokalemia/ hypophosphatemia - give KCL and K phos    Acute toxic encephalopathy - ? Delirium initially due to alcohol withdrawal- last dose of Ativan was on 7/10 - cont Thiamine and IVF with D5 - MRI has been pending for days- he was too agitated for it to be done despite Ativan on 7/9 -  MRI finally obtained on 7/11 showed mild bilateral subdural hematomas vs intracranial hypotension vs pachymeningitis- I have consulted Neuro- he is still not awakening  - LP ordered by neuro and is still pending - becoming more alert now and beginning to eat and drink     Lactic acidosis - severe due to severe anemia and alcohol abuse in setting of liver disease - lactic acid of 17.8 initially and then 4.0     Tobacco abuse - Nicotine patch    Alcohol abuse and withdrawal - withdrawal symptoms resolved - cont folic acid and thiamine    Severe protein calorie malnutrition - NG tube unable to be replaced after he pulled it - encourage oral intake -  add Ensure  Thrombocytopenia - likely due to ETOH abuse   Anemia - normal Iron levels but Ferritin is low- will give Iron infusion prior to d/c home  - Folate and B12  normal  DVT prophylaxis: SCDs Code Status: Full code Family Communication:   Disposition Plan:  Treat C diff colitis and encourage oral intake- will likely need to go to SNF Consultants:    Neuro Procedures:    NG tube x 2 Antimicrobials:  Anti-infectives (From admission, onward)   Start     Dose/Rate Route Frequency Ordered Stop   03/21/18 0000  vancomycin (VANCOCIN) 50 mg/mL oral solution 125 mg     125 mg Oral Every 6 hours 03/20/18 2136 03/28/18 2359   03/21/18 0000  vancomycin (VANCOCIN) 500 mg in sodium chloride irrigation 0.9 % 100 mL ENEMA      500 mg Rectal Every 6 hours 03/20/18 2136 03/28/18 2359   03/19/18 2000  vancomycin (VANCOCIN) 500 mg in sodium chloride irrigation 0.9 % 100 mL ENEMA  Status:  Discontinued     500 mg Rectal Every 6 hours 03/19/18 1626 03/20/18 2136   03/19/18 1630  metroNIDAZOLE (FLAGYL) IVPB 500 mg     500 mg 100 mL/hr over 60 Minutes Intravenous Every 8 hours 03/19/18 1626     03/17/18 1000  vancomycin (VANCOCIN) 50 mg/mL oral solution 125 mg  Status:  Discontinued     125 mg Oral 4 times daily 03/17/18 0739 03/20/18 2136   03/15/18 0800  ceFEPIme (MAXIPIME) 1 g in sodium chloride 0.9 % 100 mL IVPB  Status:  Discontinued     1 g 200 mL/hr over 30 Minutes Intravenous Every 8 hours 03/15/18 0603 03/17/18 0737   03/15/18 0130  ceFEPIme (MAXIPIME) 2 g in sodium chloride 0.9 % 100 mL IVPB     2 g 200 mL/hr over 30 Minutes Intravenous  Once 03/15/18 0117 03/15/18 0220       Objective: Vitals:   03/21/18 0500 03/21/18 1345 03/21/18 2145 03/22/18 0541  BP: 130/64 122/63 137/76 134/69  Pulse: 92 82 96 94  Resp: 20 16 16 18   Temp: 98.8 F (37.1 C) 97.8 F (36.6 C) 97.9 F (36.6 C) 98 F (36.7 C)  TempSrc: Axillary Oral Axillary Axillary  SpO2: 100% 98% 100% 100%  Weight: 52 kg (114 lb 10.2 oz)     Height:        Intake/Output Summary (Last 24 hours) at 03/22/2018 1205 Last data filed at 03/22/2018 0646 Gross per 24 hour  Intake 3203.33 ml  Output 1050 ml  Net 2153.33 ml   Filed Weights   03/18/18 1955 03/20/18 0636 03/21/18 0500  Weight: 46 kg (101 lb 6.6 oz) 53.4 kg (117 lb 11.6 oz) 52 kg (114 lb 10.2 oz)    Examination: General exam: Appears comfortable - malnourished HEENT: PERRLA, oral mucosa moist, no sclera icterus or thrush Respiratory system: Clear to auscultation. Respiratory effort normal. Cardiovascular system: S1 & S2 heard, RRR.   Gastrointestinal system: Abdomen soft, non-tender, nondistended. Normal bowel sound. No organomegaly Central nervous system: alert, moves arms  and legs Extremities: No cyanosis, clubbing or edema Skin: No rashes or ulcers   Data Reviewed: I have personally reviewed following labs and imaging studies  CBC: Recent Labs  Lab 03/16/18 0719 03/18/18 0427 03/19/18 0612 03/20/18 0612 03/21/18 0500 03/22/18 0519  WBC 19.7* 19.0* 18.9* 13.8* 14.3* 14.1*  NEUTROABS 17.9*  --   --   --   --   --  HGB 11.6* 10.3* 10.5* 10.4* 10.6* 9.9*  HCT 34.4* 30.5* 31.5* 31.5* 32.1* 29.8*  MCV 78.5 77.0* 79.9 81.6 82.1 82.8  PLT 78* 80* 72* 67* 71* 77*   Basic Metabolic Panel: Recent Labs  Lab 03/18/18 0427 03/19/18 0612 03/20/18 0612 03/20/18 1205 03/21/18 0500 03/21/18 0555 03/22/18 0519  NA 139 139 137  --  139  --  135  K 3.2* 2.8* 3.6  --  3.7  --  3.3*  CL 110 111 114*  --  114*  --  110  CO2 21* 22 19*  --  19*  --  18*  GLUCOSE 125* 134* 114*  --  95  --  103*  BUN <5* <5* <5*  --  <5*  --  <5*  CREATININE 0.34* 0.34* 0.33*  --  <0.30*  --  0.34*  CALCIUM 9.0 8.2* 7.8*  --  7.4*  --  7.2*  MG 1.8  --   --   --   --   --   --   PHOS <1.0* <1.0*  --  0.7*  --  2.1* 1.5*   GFR: Estimated Creatinine Clearance: 72.2 mL/min (A) (by C-G formula based on SCr of 0.34 mg/dL (L)). Liver Function Tests: Recent Labs  Lab 03/21/18 0500  AST 36  ALT 44  ALKPHOS 152*  BILITOT 3.3*  PROT 5.4*  ALBUMIN 2.2*   No results for input(s): LIPASE, AMYLASE in the last 168 hours. No results for input(s): AMMONIA in the last 168 hours. Coagulation Profile: No results for input(s): INR, PROTIME in the last 168 hours. Cardiac Enzymes: No results for input(s): CKTOTAL, CKMB, CKMBINDEX, TROPONINI in the last 168 hours. BNP (last 3 results) No results for input(s): PROBNP in the last 8760 hours. HbA1C: No results for input(s): HGBA1C in the last 72 hours. CBG: Recent Labs  Lab 03/21/18 1539 03/21/18 2231 03/22/18 0201 03/22/18 0554 03/22/18 0726  GLUCAP 79 95 120* 98 119*   Lipid Profile: No results for input(s): CHOL, HDL,  LDLCALC, TRIG, CHOLHDL, LDLDIRECT in the last 72 hours. Thyroid Function Tests: No results for input(s): TSH, T4TOTAL, FREET4, T3FREE, THYROIDAB in the last 72 hours. Anemia Panel: No results for input(s): VITAMINB12, FOLATE, FERRITIN, TIBC, IRON, RETICCTPCT in the last 72 hours. Urine analysis:    Component Value Date/Time   COLORURINE YELLOW 03/14/2018 1947   APPEARANCEUR CLEAR 03/14/2018 1947   LABSPEC 1.009 03/14/2018 1947   PHURINE 5.0 03/14/2018 1947   GLUCOSEU NEGATIVE 03/14/2018 1947   HGBUR SMALL (A) 03/14/2018 [place] NEGATIVE 03/14/2018 1947   KETONESUR 20 (A) 03/14/2018 [place] NEGATIVE 03/14/2018 1947   NITRITE NEGATIVE 03/14/2018 1947   LEUKOCYTESUR NEGATIVE 03/14/2018 1947   Sepsis Labs: @LABRCNTIP (procalcitonin:4,lacticidven:4) ) Recent Results (from the past 240 hour(s))  Culture, blood (routine x 2)     Status: None   Collection Time: 03/14/18  7:04 PM  Result Value Ref Range Status   Specimen Description   Final    BLOOD BLOOD RIGHT FOREARM Performed at [facility], [place] [address], [place], [place] [address]    Special Requests   Final    BOTTLES DRAWN AEROBIC AND ANAEROBIC Blood Culture adequate volume Performed at [place] [address] [place], [place] [address]    Culture   Final    NO GROWTH 5 DAYS Performed at [facility], [place] [address] [place], [place] [address]    Report Status 03/20/2018 FINAL  Final  Culture, blood (routine x 2)     Status: None   Collection Time: 03/14/18  7:09 PM  Result Value Ref Range Status   Specimen Description   Final    BLOOD BLOOD RIGHT FOREARM Performed at [facility] [address], [place], [place] [address]    Special Requests   Final    BOTTLES DRAWN AEROBIC AND ANAEROBIC Blood Culture adequate volume Performed at [facility] [address], [place], [place] [address]    Culture   Final     NO GROWTH 5 DAYS Performed at [facility], [place] [address], [place], [place] [address]    Report Status 03/20/2018 FINAL  Final  Urine culture     Status: Abnormal   Collection Time: 03/14/18  7:47 PM  Result Value Ref Range Status   Specimen Description   Final    URINE, CLEAN CATCH Performed at [facility], [place] [address], [place], [place] [address]    Special Requests   Final    Normal Performed at [facility], [place] [address], [place], [place] [address]    Culture MULTIPLE SPECIES PRESENT, SUGGEST RECOLLECTION (A)  Final   Report Status 03/16/2018 FINAL  Final  C difficile quick scan w PCR reflex     Status: Abnormal   Collection Time: 03/16/18 11:17 AM  Result Value Ref Range Status   C Diff antigen POSITIVE (A) NEGATIVE Final   C Diff toxin NEGATIVE NEGATIVE Final   C Diff interpretation Results are indeterminate. See PCR results.  Final    Comment: Performed at [facility], [place] [address], [place], [place] [address]  C. Diff by PCR, Reflexed     Status: Abnormal   Collection Time: 03/16/18 11:17 AM  Result Value Ref Range Status   Toxigenic C. Difficile by PCR POSITIVE (A) NEGATIVE Final    Comment: Positive for toxigenic C. difficile with little to no toxin production. Only treat if clinical presentation suggests symptomatic illness. Performed at [facility], [place] [address], [place],  [address]          Radiology Studies: No results found.    Scheduled Meds: . feeding supplement (ENSURE ENLIVE)  237 mL Oral TID BM  . folic acid  1 mg Intravenous Daily  . multivitamin  15 mL Per Tube Daily  . nicotine  14 mg Transdermal Daily  . potassium & sodium phosphates  1 packet Per NG tube TID WC & HS  . potassium chloride  40 mEq Oral Q4H  . sodium chloride flush  10-40 mL Intracatheter Q12H  . thiamine  100 mg Oral Daily   Or  . thiamine  100 mg Intravenous Daily  . vancomycin  125 mg  Oral Q6H   Or  . vancomycin (VANCOCIN) rectal ENEMA  500 mg Rectal Q6H   Continuous Infusions: . dextrose 5 % and 0.45 % NaCl with KCl 40 mEq/L 125 mL/hr at 03/22/18 0540  . famotidine (PEPCID) IV Stopped (03/21/18 2200)  . metronidazole Stopped (03/22/18 0224)  . potassium PHOSPHATE IVPB (mEq)       LOS: 8 days    Time spent in minutes: 35    Debbe Odea, MD [facility] Pager: [email or web address] Password [facility] 03/22/2018, 12:05 PM

## 2018-03-22 NOTE — Progress Notes (Signed)
Nutrition Follow-up  DOCUMENTATION CODES:   Severe malnutrition in context of chronic illness, Underweight  INTERVENTION:   Monitor magnesium, potassium, and phosphorus daily for at least 3 days, MD to replete as needed, as pt is at risk for refeeding syndrome givensevere malnutrition, ETOH abuse. Phos and K levels still low.  Continue Ensure Enlive po TID, each supplement provides 350 kcal and 20 grams of protein  NUTRITION DIAGNOSIS:   Severe Malnutrition related to chronic illness(ETOH abuse) as evidenced by energy intake < or equal to 75% for > or equal to 1 month, severe fat depletion, severe muscle depletion.  Ongoing.  GOAL:   Patient will meet greater than or equal to 90% of their needs  Not meeting.  MONITOR:   PO intake, Supplement acceptance, Weight trends, Labs, I & O's  ASSESSMENT:   62 y.o. 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  He was brought in because of progressive weakness near syncope and confusion  7/12 -pt pulled NGT out. TF stopped.   Now patient is on a soft diet now and eating very little. Pt consumed some Ensure supplements and icees yesterday. Will continue supplements. MD encouraged family to bring outside food in.   Will continue to monitor for plan.  Medications: IV Folic acid daily, Liquid MVI daily, PHOS-NAK QID, K-DUR tablet every 4 hours, Thiamine daily, D5 and .45% NaCl w/ KCl infusion at 125 ml/hr -provides 510 kcal Labs reviewed: CBGs: 98-119 Low K, Phos  Diet Order:   Diet Order           DIET SOFT Room service appropriate? Yes; Fluid consistency: Thin  Diet effective now          EDUCATION NEEDS:      Skin:  Skin Assessment: Reviewed RN Assessment  Last BM:  7/10  Height:   Ht Readings from Last 1 Encounters:  03/15/18 5\' 10"  (1.778 m)    Weight:    Wt Readings from Last 1 Encounters:  03/21/18 114 lb 10.2 oz (52 kg)    Ideal Body Weight:  75.5 kg  BMI:  Body mass index is 16.45 kg/m.  Estimated Nutritional Needs:   Kcal:  1800-2000  Protein:  85-95g  Fluid:  2L/day  Clayton Bibles, MS, RD, LDN [place] [facility] Pager: [phone] After Hours Pager: [phone]

## 2018-03-23 ENCOUNTER — Inpatient Hospital Stay (HOSPITAL_COMMUNITY): Payer: BC Managed Care – PPO

## 2018-03-23 LAB — BASIC METABOLIC PANEL
Anion gap: 3 — ABNORMAL LOW (ref 5–15)
BUN: <5 mg/dL — ABNORMAL LOW (ref 6–20)
CHLORIDE: 109 mmol/L (ref 98–111)
CO2: 19 mmol/L — AB (ref 22–32)
Calcium: 7.2 mg/dL — ABNORMAL LOW (ref 8.9–10.3)
Creatinine, Ser: 0.32 mg/dL — ABNORMAL LOW (ref 0.61–1.24)
GFR calc non Af Amer: >60 mL/min (ref >60)
GLUCOSE: 92 mg/dL (ref 70–99)
POTASSIUM: 3.8 mmol/L (ref 3.5–5.1)
Sodium: 131 mmol/L — ABNORMAL LOW (ref 135–145)

## 2018-03-23 LAB — CBC
HEMATOCRIT: 29.4 % — AB (ref 39.0–52.0)
HEMOGLOBIN: 9.5 g/dL — AB (ref 13.0–17.0)
MCH: 27.1 pg (ref 26.0–34.0)
MCHC: 32.3 g/dL (ref 30.0–36.0)
MCV: 84 fL (ref 78.0–100.0)
Platelets: 80 10*3/uL — ABNORMAL LOW (ref 150–400)
RBC: 3.5 MIL/uL — AB (ref 4.22–5.81)
RDW: 30.1 % — ABNORMAL HIGH (ref 11.5–15.5)
WBC: 12.5 10*3/uL — ABNORMAL HIGH (ref 4.0–10.5)

## 2018-03-23 LAB — COMPREHENSIVE METABOLIC PANEL
ALBUMIN: 1.9 g/dL — AB (ref 3.5–5.0)
ALT: 26 U/L (ref 0–44)
ANION GAP: 6 (ref 5–15)
AST: 29 U/L (ref 15–41)
Alkaline Phosphatase: 121 U/L (ref 38–126)
CO2: 16 mmol/L — AB (ref 22–32)
Calcium: 6.7 mg/dL — ABNORMAL LOW (ref 8.9–10.3)
Chloride: 112 mmol/L — ABNORMAL HIGH (ref 98–111)
Creatinine, Ser: 0.34 mg/dL — ABNORMAL LOW (ref 0.61–1.24)
GFR calc Af Amer: >60 mL/min (ref >60)
GLUCOSE: 81 mg/dL (ref 70–99)
Potassium: 3.7 mmol/L (ref 3.5–5.1)
SODIUM: 134 mmol/L — AB (ref 135–145)
TOTAL PROTEIN: 4.8 g/dL — AB (ref 6.5–8.1)
Total Bilirubin: 2.8 mg/dL — ABNORMAL HIGH (ref 0.3–1.2)

## 2018-03-23 LAB — GLUCOSE, CAPILLARY
GLUCOSE-CAPILLARY: 112 mg/dL — AB (ref 70–99)
GLUCOSE-CAPILLARY: 87 mg/dL (ref 70–99)
Glucose-Capillary: 99 mg/dL (ref 70–99)
Glucose-Capillary: 77 mg/dL (ref 70–99)
Glucose-Capillary: 70 mg/dL (ref 70–99)
Glucose-Capillary: 64 mg/dL — ABNORMAL LOW (ref 70–99)
Glucose-Capillary: 65 mg/dL — ABNORMAL LOW (ref 70–99)

## 2018-03-23 LAB — PROTIME-INR
INR: 1.89
Prothrombin Time: 21.6 seconds — ABNORMAL HIGH (ref 11.4–15.2)

## 2018-03-23 LAB — PHOSPHORUS: Phosphorus: 1.9 mg/dL — ABNORMAL LOW (ref 2.5–4.6)

## 2018-03-23 MED ORDER — POTASSIUM & SODIUM PHOSPHATES 280-160-250 MG PO PACK
1.0000 | PACK | Freq: Three times a day (TID) | ORAL | Status: DC
  Administered 2018-03-23 – 2018-03-24 (×4): 1 via ORAL
  Filled 2018-03-23 (×6): qty 1

## 2018-03-23 MED ORDER — LORAZEPAM 2 MG/ML IJ SOLN
1.0000 mg | Freq: Four times a day (QID) | INTRAMUSCULAR | Status: DC | PRN
  Administered 2018-03-24: 1 mg via INTRAVENOUS
  Filled 2018-03-23: qty 1

## 2018-03-23 MED ORDER — POTASSIUM CHLORIDE IN NACL 40-0.9 MEQ/L-% IV SOLN
INTRAVENOUS | Status: DC
  Administered 2018-03-23: 75 mL/h via INTRAVENOUS
  Filled 2018-03-23 (×2): qty 1000

## 2018-03-23 MED ORDER — LORAZEPAM 2 MG/ML IJ SOLN: 0.0000 mg | Freq: Four times a day (QID) | INTRAMUSCULAR | Status: DC

## 2018-03-23 MED ORDER — LORAZEPAM 2 MG/ML IJ SOLN: 0.0000 mg | Freq: Two times a day (BID) | INTRAMUSCULAR | Status: DC

## 2018-03-23 MED ORDER — SODIUM CHLORIDE 0.9 % IV SOLN
510.0000 mg | Freq: Once | INTRAVENOUS | Status: AC
  Administered 2018-03-23: 510 mg via INTRAVENOUS
  Filled 2018-03-23: qty 17

## 2018-03-23 MED ORDER — DEXTROSE-NACL 5-0.9 % IV SOLN
INTRAVENOUS | Status: DC
  Administered 2018-03-23 – 2018-03-24 (×2): via INTRAVENOUS

## 2018-03-23 MED ORDER — LORAZEPAM 1 MG PO TABS: 1.0000 mg | ORAL_TABLET | Freq: Four times a day (QID) | ORAL | Status: DC | PRN

## 2018-03-23 NOTE — Progress Notes (Signed)
Modified Barium Swallow Progress Note  Patient Details  Name: BELMONT VALLI MRN: [id number] Date of Birth: 01-11-1956  Today's Date: 03/23/2018  Modified Barium Swallow completed.  Full report located under Chart Review in the Imaging Section.  Brief recommendations include the following:  Clinical Impression  Pt presents with moderate oropharyngeal dysphagia without aspiration or penetration with any consistency tested.  Dysphagia presents as decreased oral coordination resulting in piecemealing and delayed transiting.  Phayrngeal swallow characterized by decreased epiglottic deflection and laryngeal elevation resulting in residuals t/o pharynx.  Solids/puree residuals worse than liquids.  Chin tuck posture decreased accumulation of residuals with solids as well as consuming liquids.  He does NOT sense residuals however which increases his risk.  Pt did not cough or clear his throat during tested.  No aspiration despite cues to consume thin liquids sequentially with head neutral.  Pt piecemeals and swallows multiple times with each bolus protecting his airway.  Therapeutic intervention including determining strategies to compensate for dysphagia and reinforcing using teach back and video screen.  Recommend continue soft/thin with precautions.    Swallow Evaluation Recommendations       SLP Diet Recommendations: Dysphagia 3 (Mech soft) solids;Thin liquid   Liquid Administration via: Cup;Straw   Medication Administration: Whole meds with liquid   Supervision: Intermittent supervision to cue for compensatory strategies   Compensations: Slow rate;Small sips/bites;Other (Comment);Chin tuck;Follow solids with liquid;Multiple dry swallows after each bite/sip(tuck chin with solids)   Postural Changes: Seated upright at 90 degrees;Remain semi-upright after after feeds/meals (Comment)   Oral Care Recommendations: Oral care BID       [name], MS [facility] SLP [phone]  [name] 03/23/2018,9:45 AM

## 2018-03-23 NOTE — Clinical Social Work Note (Signed)
Clinical Social Work Assessment  Patient Details  Name: James [name] MRN: [id number] Date of Birth: 07/19/1956  Date of referral:  03/23/18               Reason for consult:  Facility Placement                Permission sought to share information with:  Family Supports Permission granted to share information::  Yes, Verbal Permission Granted  Name::     wife Ferne Coe::     Relationship::     Contact Information:     Housing/Transportation Living arrangements for the past 2 months:  Single Family Home Source of Information:  Spouse, Medical Team Patient Interpreter Needed:  None Criminal Activity/Legal Involvement Pertinent to Current Situation/Hospitalization:  No - Comment as needed Significant Relationships:  Spouse Lives with:  Spouse Do you feel safe going back to the place where you live?  Yes Need for family participation in patient care:  Yes (Comment)(pt intermittently oriented)  Care giving concerns:  Pt admitted from home where he resides with his wife. Admitted for anemia, sepsis, and alcohol use. Decreased oral intake (had NG tube during hospitalization). Oral intake improving per staff (still needs assistance). Experienced etoh withdrawal sx while inpatient (has been on CIWA protocol). None noted currently.  Wife states at baseline pt requires some assistance but has been managing by himself at home during the day while wife works. Pt has not worked part time or full time in several years and she has considered applying for disability for him. Pt became very disoriented, agitated, them somnolent during hospitalization but has become more alert and oriented during past 48 hours.   Social Worker assessment / plan:  CSW consulted to assist with disposition. SNF recommended per therapy notes due to pt's extensive assistance needed. Discussed this with wife, she is considering care options with family but feels strongly that she will want SNF pursued for pt.  CSW  made referrals and provided bed offers. Explained insurance authorization request process which can be initiated once facility selected. Wife understanding and states she will call with choice after discussing plans further with family. Will submit for PASSAR and complete FL2.  PLan: TBD- tentatively planning for SNF admission if insurance authorizes. Wife and family deliberating  Employment status:  Retired Field seismologist) PT Recommendations:  [place], [place] / Referral to community resources:  [place]  Patient/Family's Response to care:  Wife appreciative  Patient/Family's Understanding of and Emotional Response to Diagnosis, Current Treatment, and Prognosis:  Pt calm but did not process emotional response- intermittently oriented. Wife shows good understanding, asks pertinent questions, and is emotionally well adjusted   Emotional Assessment Appearance:  Appears stated age Attitude/Demeanor/Rapport:  (drowsy) Affect (typically observed):  Calm Orientation:  Oriented to Self, Oriented to Place, Oriented to  Time Alcohol / Substance use:  Alcohol Use Psych involvement (Current and /or in the community):  No (Comment)  Discharge Needs  Concerns to be addressed:  Discharge Planning Concerns Readmission within the last 30 days:  No Current discharge risk:  Dependent with Mobility Barriers to Discharge:  Continued Medical Work up, [employer], [place] 03/23/2018, 2:10 PM  [phone]

## 2018-03-23 NOTE — Progress Notes (Signed)
Physical Therapy Treatment Patient Details Name: James [name] MRN: [id number] DOB: August 06, 1956 Today's Date: 03/23/2018    History of Present Illness JAHMEIR GEISEN is an 62 y.o. 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  He was brought in because of progressive weakness near syncope and confusion    PT Comments    Performed rolling L and R multiple times for pericare, pt was lying in large pool of diarrhea upon start of PT session. Posterior thighs very red and raw, RN notified. Barrier cream applied. May benefit from rectal tube. Mod assist sidelying to sit and for sit to stand. Pt took several pivotal steps to recliner with RW and min assist. Performed seated LE strengthening exercises. Pt oriented to self and year but not location, thinks he's at [facility].    Follow Up Recommendations  SNF;Supervision/Assistance - 24 hour     Equipment Recommendations  None recommended by PT    Recommendations for Other Services       Precautions / Restrictions Precautions Precautions: Fall Restrictions Weight Bearing Restrictions: No    Mobility  Bed Mobility   Bed Mobility: Rolling;Sidelying to Sit Rolling: Mod assist Sidelying to sit: Mod assist       General bed mobility comments: rolled multiple times L and R for pericare, pt found lying in large amount of diarrhea; mod A to advance LEs with sidelying to sit  Transfers Overall transfer level: Needs assistance Equipment used: Rolling walker (2 wheeled) Transfers: Sit to/from [facility] Sit to Stand: +2 safety/equipment;Mod assist Stand pivot transfers: Min assist       General transfer comment: mod A to rise, min A to take several pivotal steps to recliner, distance limited by fatigue, VCs hand placement  Ambulation/Gait                  Stairs             Wheelchair Mobility    Modified Rankin (Stroke Patients Only)       Balance Overall balance assessment: Needs assistance Sitting-balance support: Feet supported;Single extremity supported Sitting balance-Leahy Scale: Fair     Standing balance support: Bilateral upper extremity supported Standing balance-Leahy Scale: Poor Standing balance comment: relies on BUE support                            Cognition Arousal/Alertness: Awake/alert Behavior During Therapy: Flat affect Overall Cognitive Status: No family/caregiver present to determine baseline cognitive functioning Area of Impairment: Orientation;Safety/judgement;Awareness                 Orientation Level: Disoriented to;Place;Situation     Following Commands: Follows one step commands consistently Safety/Judgement: Decreased awareness of safety;Decreased awareness of deficits     General Comments: followed commands today, stated he is at "[employer]" when asked location, oriented to year      Exercises General Exercises - Lower Extremity Ankle Circles/Pumps: AAROM;10 reps;Seated Long Arc Quad: AROM;Both;10 reps;Seated Hip Flexion/Marching: AROM;Both;5 reps;Seated    General Comments        Pertinent Vitals/Pain Faces Pain Scale: Hurts whole lot Pain Location: perineal area during pericare Pain Descriptors / Indicators: Grimacing Pain Intervention(s): Limited activity within patient's tolerance;Monitored during session(barrier cream applied, recommended rectal tube to RN, pt's posterior thighs are very raw and red)  Home Living                      Prior Function            PT Goals (current goals can now be found in the care plan section) Acute Rehab PT Goals PT Goal Formulation: With patient Time For Goal Achievement: 03/30/18 Potential to Achieve Goals: Fair Progress towards PT goals: Progressing toward goals    Frequency    Min  2X/week      PT Plan Current plan remains appropriate    Co-evaluation              AM-PAC PT "6 Clicks" Daily Activity  Outcome Measure  Difficulty turning over in bed (including adjusting bedclothes, sheets and blankets)?: Unable Difficulty moving from lying on back to sitting on the side of the bed? : Unable Difficulty sitting down on and standing up from a chair with arms (e.g., wheelchair, bedside commode, etc,.)?: Unable Help needed moving to and from a bed to chair (including a wheelchair)?: A Lot Help needed walking in hospital room?: A Lot Help needed climbing 3-5 steps with a railing? : Total 6 Click Score: 8    End of Session Equipment Utilized During Treatment: Gait belt Activity Tolerance: Patient tolerated treatment well Patient left: in chair;with chair alarm set;with call bell/phone within reach Nurse Communication: Mobility status PT Visit Diagnosis: Difficulty in walking, not elsewhere classified (R26.2);Muscle weakness (generalized) (M62.81);Adult, failure to thrive (R62.7)     Time: 3567-0141 PT Time Calculation (min) (ACUTE ONLY): 41 min  Charges:  $Therapeutic Activity: 38-52 mins                    G Codes:          Blondell Reveal Kistler 03/23/2018, 1:29 PM [phone]

## 2018-03-23 NOTE — Progress Notes (Signed)
PROGRESS NOTE    LOIS OSTROM   RSW:[id number]  DOB: May 05, 1956  DOA: 03/14/2018 PCP: Denita Lung, MD   Brief Narrative:  [name] 62 y.o. male past medical history recurrent severe anemia requiring multiple transfusions due to AVMs with chronic blood loss, alcohol abuse cachectic comes in for near weakness, near syncope and was found to have a hemoglobin of 2. Noted to be confused in the ED with Temp of 91.2, BP 92/54, WBC 43.000.  Admitted for blood transfusions and started on IV antibiotics for possibly sepsis.    Subjective: He does not want to eat breakfast this AM. He has had and SLP eval.  Assessment & Plan:   Principal Problem:   Symptomatic Anemia  - Hb of 2 may have been inaccurate as after 3 U Hb was 9 - has h/o AVMs - he told another physician that he has bright red blood in stool and has not been compliant with Iron tabs - FOB negative X 2  - s/p 6 U PRBC  - normal Iron levels but Ferritin is low- will give Iron infusion today - Folate and B12  normal  Active Problems: Sepsis due to C diff colitis - started on Cefepime initially for sepsis of unknown origin- blood cultures have been negative - he has been having diarrhea  - c diff Antigen was +, toxin was Negative and PCR is + - 7/10-   NG tube placed as patient was not alert enough to take oral vancomycin- NG tube entered the lungs and then had to be replaced by IR- Vancomycin eventually was started- stools are no longer runny per RN -7/11-  NG was not able to be flushed, xray showed it to be curled and hitting the gastric wall, it was pulled back and was able to be used again by 7/11 evening - 7/12 at 2 AM, pulled NG and IR could not replace it - 7/13 more alert today- he did take about 3/4 of the oral Vancomycin- WBC down to 13.8 - 7/14- taking Vanc orally now  - 7/16- WBC count better today- stop Flagyl  Severe protein calorie malnutrition - NG tube unable to be replaced after he pulled it -  encourage oral intake- is intermittently poor - added Ensure -  oral intake finally is improving but is still very erratic - can cut back on IVF from 125 cc/hr to 75 now   Basilar infiltrates - CXR after NG was placed in patient's right lung showed b/l interstitial edema vs basilar consolidation - repeat CXR is more consistent with edema rather than pneumonia - he does not appear fluid overloaded and is not hypoxic therefore I will hold off on giving diuretics  Hypokalemia/ hypophosphatemia - give KCL and K phos    Acute toxic encephalopathy - ? Delirium initially due to alcohol withdrawal- last dose of Ativan was on 7/10 - cont Thiamine and IVF with D5 - MRI has been pending for days- he was too agitated for it to be done despite Ativan on 7/9 -  MRI finally obtained on 7/11 showed mild bilateral subdural hematomas vs intracranial hypotension vs pachymeningitis- I have consulted Neuro- he is still not awakening  - LP ordered by neuro and is still pending - becoming more alert now and beginning to eat and drink  Dysphagia - per SLP, D 3 diet with thin liquids     Lactic acidosis - severe due to severe anemia and alcohol abuse in setting of liver  disease - lactic acid of 17.8 initially and then 4.0     Tobacco abuse - Nicotine patch    Alcohol abuse and withdrawal - withdrawal symptoms resolved - cont folic acid and thiamine   Cirrhosis due to alcohol abuse - elevated AST, Alp phos and T bili - due to ETOH abuse- recheck LFTs tomorrow   Thrombocytopenia - likely due to ETOH abuse    Underweight  Body mass index is 16.36 kg/m.   DVT prophylaxis: SCDs Code Status: Full code Family Communication:   Disposition Plan:  Treat C diff colitis and encourage oral intake- PT recommends SNF- have discussed with social worker today Consultants:    Neuro Procedures:    NG tube x 2 Antimicrobials:  Anti-infectives (From admission, onward)   Start     Dose/Rate Route  Frequency Ordered Stop   03/22/18 1600  metroNIDAZOLE (FLAGYL) tablet 500 mg  Status:  Discontinued     500 mg Oral Every 8 hours 03/22/18 1325 03/23/18 0809   03/21/18 0000  vancomycin (VANCOCIN) 50 mg/mL oral solution 125 mg     125 mg Oral Every 6 hours 03/20/18 2136 03/28/18 2359   03/21/18 0000  vancomycin (VANCOCIN) 500 mg in sodium chloride irrigation 0.9 % 100 mL ENEMA     500 mg Rectal Every 6 hours 03/20/18 2136 03/28/18 2359   03/19/18 2000  vancomycin (VANCOCIN) 500 mg in sodium chloride irrigation 0.9 % 100 mL ENEMA  Status:  Discontinued     500 mg Rectal Every 6 hours 03/19/18 1626 03/20/18 2136   03/19/18 1630  metroNIDAZOLE (FLAGYL) IVPB 500 mg  Status:  Discontinued     500 mg 100 mL/hr over 60 Minutes Intravenous Every 8 hours 03/19/18 1626 03/22/18 1325   03/17/18 1000  vancomycin (VANCOCIN) 50 mg/mL oral solution 125 mg  Status:  Discontinued     125 mg Oral 4 times daily 03/17/18 0739 03/20/18 2136   03/15/18 0800  ceFEPIme (MAXIPIME) 1 g in sodium chloride 0.9 % 100 mL IVPB  Status:  Discontinued     1 g 200 mL/hr over 30 Minutes Intravenous Every 8 hours 03/15/18 0603 03/17/18 0737   03/15/18 0130  ceFEPIme (MAXIPIME) 2 g in sodium chloride 0.9 % 100 mL IVPB     2 g 200 mL/hr over 30 Minutes Intravenous  Once 03/15/18 0117 03/15/18 0220       Objective: Vitals:   03/22/18 1700 03/22/18 2044 03/23/18 0520 03/23/18 1510  BP:  106/62 (!) 96/52 116/75  Pulse:  79 81 86  Resp:  18 16 18   Temp:  (!) 97.5 F (36.4 C) 98.4 F (36.9 C) 97.8 F (36.6 C)  TempSrc:  Oral Oral Oral  SpO2:  100% 99% 92%  Weight: 51.7 kg (114 lb)     Height:        Intake/Output Summary (Last 24 hours) at 03/23/2018 1612 Last data filed at 03/22/2018 1857 Gross per 24 hour  Intake 1867.98 ml  Output 200 ml  Net 1667.98 ml   Filed Weights   03/20/18 0636 03/21/18 0500 03/22/18 1700  Weight: 53.4 kg (117 lb 11.6 oz) 52 kg (114 lb 10.2 oz) 51.7 kg (114 lb)     Examination: General exam: Appears comfortable - appears malnourished HEENT: PERRLA, oral mucosa moist, no sclera icterus or thrush Respiratory system: Clear to auscultation. Respiratory effort normal. Cardiovascular system: S1 & S2 heard, RRR.   Gastrointestinal system: Abdomen soft, non-tender, nondistended. Normal bowel sound. No organomegaly Central  nervous system: alert, moves arms and legs Extremities: No cyanosis, clubbing or edema Skin: No rashes or ulcers   Data Reviewed: I have personally reviewed following labs and imaging studies  CBC: Recent Labs  Lab 03/19/18 0612 03/20/18 0612 03/21/18 0500 03/22/18 0519 03/23/18 0612  WBC 18.9* 13.8* 14.3* 14.1* 12.5*  HGB 10.5* 10.4* 10.6* 9.9* 9.5*  HCT 31.5* 31.5* 32.1* 29.8* 29.4*  MCV 79.9 81.6 82.1 82.8 84.0  PLT 72* 67* 71* 77* 80*   Basic Metabolic Panel: Recent Labs  Lab 03/18/18 0427 03/19/18 0612 03/20/18 0612 03/20/18 1205 03/21/18 0500 03/21/18 0555 03/22/18 0519 03/23/18 0612  NA 139 139 137  --  139  --  135 131*  K 3.2* 2.8* 3.6  --  3.7  --  3.3* 3.8  CL 110 111 114*  --  114*  --  110 109  CO2 21* 22 19*  --  19*  --  18* 19*  GLUCOSE 125* 134* 114*  --  95  --  103* 92  BUN <5* <5* <5*  --  <5*  --  <5* <5*  CREATININE 0.34* 0.34* 0.33*  --  <0.30*  --  0.34* 0.32*  CALCIUM 9.0 8.2* 7.8*  --  7.4*  --  7.2* 7.2*  MG 1.8  --   --   --   --   --   --   --   PHOS <1.0* <1.0*  --  0.7*  --  2.1* 1.5* 1.9*   GFR: Estimated Creatinine Clearance: 71.8 mL/min (A) (by C-G formula based on SCr of 0.32 mg/dL (L)). Liver Function Tests: Recent Labs  Lab 03/21/18 0500  AST 36  ALT 44  ALKPHOS 152*  BILITOT 3.3*  PROT 5.4*  ALBUMIN 2.2*   No results for input(s): LIPASE, AMYLASE in the last 168 hours. No results for input(s): AMMONIA in the last 168 hours. Coagulation Profile: No results for input(s): INR, PROTIME in the last 168 hours. Cardiac Enzymes: No results for input(s): CKTOTAL,  CKMB, CKMBINDEX, TROPONINI in the last 168 hours. BNP (last 3 results) No results for input(s): PROBNP in the last 8760 hours. HbA1C: No results for input(s): HGBA1C in the last 72 hours. CBG: Recent Labs  Lab 03/22/18 2047 03/23/18 0022 03/23/18 0517 03/23/18 0800 03/23/18 1152  GLUCAP 98 112* 99 77 87   Lipid Profile: No results for input(s): CHOL, HDL, LDLCALC, TRIG, CHOLHDL, LDLDIRECT in the last 72 hours. Thyroid Function Tests: No results for input(s): TSH, T4TOTAL, FREET4, T3FREE, THYROIDAB in the last 72 hours. Anemia Panel: No results for input(s): VITAMINB12, FOLATE, FERRITIN, TIBC, IRON, RETICCTPCT in the last 72 hours. Urine analysis:    Component Value Date/Time   COLORURINE YELLOW 03/14/2018 1947   APPEARANCEUR CLEAR 03/14/2018 1947   LABSPEC 1.009 03/14/2018 1947   PHURINE 5.0 03/14/2018 1947   GLUCOSEU NEGATIVE 03/14/2018 1947   HGBUR SMALL (A) 03/14/2018 [place] NEGATIVE 03/14/2018 1947   KETONESUR 20 (A) 03/14/2018 [place] NEGATIVE 03/14/2018 1947   NITRITE NEGATIVE 03/14/2018 1947   LEUKOCYTESUR NEGATIVE 03/14/2018 1947   Sepsis Labs: @LABRCNTIP (procalcitonin:4,lacticidven:4) ) Recent Results (from the past 240 hour(s))  Culture, blood (routine x 2)     Status: None   Collection Time: 03/14/18  7:04 PM  Result Value Ref Range Status   Specimen Description   Final    BLOOD BLOOD RIGHT FOREARM Performed at [facility], [place] [address], [place], [place] [address]  Special Requests   Final    BOTTLES DRAWN AEROBIC AND ANAEROBIC Blood Culture adequate volume Performed at [place] [address], [place], [place] [address]    Culture   Final    NO GROWTH 5 DAYS Performed at [facility], [place] [address] [place], [place] [address]    Report Status 03/20/2018 FINAL  Final  Culture, blood (routine x 2)     Status: None   Collection Time: 03/14/18  7:09 PM  Result Value Ref Range  Status   Specimen Description   Final    BLOOD BLOOD RIGHT FOREARM Performed at [facility] [address], [place], [place] [address]    Special Requests   Final    BOTTLES DRAWN AEROBIC AND ANAEROBIC Blood Culture adequate volume Performed at [place] [address] [place], [place] [address]    Culture   Final    NO GROWTH 5 DAYS Performed at [facility], [place] [address], [place], [place] [address]    Report Status 03/20/2018 FINAL  Final  Urine culture     Status: Abnormal   Collection Time: 03/14/18  7:47 PM  Result Value Ref Range Status   Specimen Description   Final    URINE, CLEAN CATCH Performed at [facility], [place] [address], [place], [place] [address]    Special Requests   Final    Normal Performed at [facility], [place] [address], [place], [place] [address]    Culture MULTIPLE SPECIES PRESENT, SUGGEST RECOLLECTION (A)  Final   Report Status 03/16/2018 FINAL  Final  C difficile quick scan w PCR reflex     Status: Abnormal   Collection Time: 03/16/18 11:17 AM  Result Value Ref Range Status   C Diff antigen POSITIVE (A) NEGATIVE Final   C Diff toxin NEGATIVE NEGATIVE Final   C Diff interpretation Results are indeterminate. See PCR results.  Final    Comment: Performed at [facility], [place] [address], [place], [place] [address]  C. Diff by PCR, Reflexed     Status: Abnormal   Collection Time: 03/16/18 11:17 AM  Result Value Ref Range Status   Toxigenic C. Difficile by PCR POSITIVE (A) NEGATIVE Final    Comment: Positive for toxigenic C. difficile with little to no toxin production. Only treat if clinical presentation suggests symptomatic illness. Performed at [facility], [place] [address] [place],  [address]      Radiology Studies: No results found.  Scheduled Meds: . famotidine  20 mg Oral BID  . feeding supplement (ENSURE ENLIVE)  237 mL  Oral TID BM  . folic acid  1 mg Oral Daily  . multivitamin  15 mL Per Tube Daily  . nicotine  14 mg Transdermal Daily  . potassium & sodium phosphates  1 packet Per NG tube TID WC & HS  . sodium chloride flush  10-40 mL Intracatheter Q12H  . thiamine  100 mg Oral Daily  . vancomycin  125 mg Oral Q6H   Or  . vancomycin (VANCOCIN) rectal ENEMA  500 mg Rectal Q6H   Continuous Infusions: . dextrose 5 % and 0.45 % NaCl with KCl 40 mEq/L 125 mL/hr at 03/23/18 0300     LOS: 9 days    Time spent in minutes: 35    Debbe Odea, MD [facility] Pager: [email or web address] Password [facility] 03/23/2018, 4:12 PM

## 2018-03-24 LAB — GLUCOSE, CAPILLARY
GLUCOSE-CAPILLARY: 61 mg/dL — AB (ref 70–99)
GLUCOSE-CAPILLARY: 67 mg/dL — AB (ref 70–99)
GLUCOSE-CAPILLARY: 74 mg/dL (ref 70–99)
GLUCOSE-CAPILLARY: 87 mg/dL (ref 70–99)
Glucose-Capillary: 78 mg/dL (ref 70–99)
Glucose-Capillary: 78 mg/dL (ref 70–99)
Glucose-Capillary: 81 mg/dL (ref 70–99)

## 2018-03-24 LAB — CBC
HCT: 28.2 % — ABNORMAL LOW (ref 39.0–52.0)
Hemoglobin: 9.2 g/dL — ABNORMAL LOW (ref 13.0–17.0)
MCH: 27.7 pg (ref 26.0–34.0)
MCHC: 32.6 g/dL (ref 30.0–36.0)
MCV: 84.9 fL (ref 78.0–100.0)
PLATELETS: 80 10*3/uL — AB (ref 150–400)
RBC: 3.32 MIL/uL — AB (ref 4.22–5.81)
RDW: 29.6 % — ABNORMAL HIGH (ref 11.5–15.5)
WBC: 10.2 10*3/uL (ref 4.0–10.5)

## 2018-03-24 LAB — AMMONIA: Ammonia: 33 umol/L (ref 9–35)

## 2018-03-24 LAB — PHOSPHORUS: Phosphorus: 2.1 mg/dL — ABNORMAL LOW (ref 2.5–4.6)

## 2018-03-24 MED ORDER — LORAZEPAM 2 MG/ML IJ SOLN
1.0000 mg | INTRAMUSCULAR | Status: DC | PRN
  Administered 2018-03-24 – 2018-04-04 (×7): 1 mg via INTRAVENOUS
  Filled 2018-03-24 (×7): qty 1

## 2018-03-24 MED ORDER — DEXTROSE-NACL 5-0.45 % IV SOLN
INTRAVENOUS | Status: DC
  Administered 2018-03-24 – 2018-04-04 (×14): via INTRAVENOUS

## 2018-03-24 NOTE — NC FL2 (Signed)
LEVEL OF CARE SCREENING TOOL     IDENTIFICATION  Patient Name: James [name] Birthdate: 1955-10-04 Sex: male Admission Date (Current Location): 03/14/2018  [facility] and [place] Number:  Herbalist and Address:  [facility],  [place], [place]      Provider Number: [id number]  Attending Physician Name and Address:  Dessa Phi, DO  Relative Name and Phone Number:       Current Level of Care: Hospital Recommended Level of Care: [place] Prior Approval Number:    Date Approved/Denied:   PASRR Number: [id number]  Discharge Plan: SNF    Current Diagnoses: Patient Active Problem List   Diagnosis Date Noted  . Thrombocytopenia ([place]) 03/16/2018  . Acute metabolic encephalopathy 35/32/9924  . Watery diarrhea 03/16/2018  . Microcytic anemia 09/10/2017  . Acute respiratory failure with hypoxia ([place])   . Hypokalemia 03/02/2017  . Symptomatic anemia 03/02/2017  . Hyponatremia 03/02/2017  . Right hip pain 03/02/2017  . Lactic acidosis   . History of peptic ulcer disease 10/06/2016  . Poor dentition 10/06/2016  . Iron deficiency anemia due to chronic blood loss 09/17/2016  . Alcoholic gastritis with hemorrhage   . Marijuana abuse   . Severe protein-calorie malnutrition ([place]) 01/02/2015  . ILD (interstitial lung disease) ([place]) 10/24/2011  . Pulmonary nodule 10/24/2011  . Cachexia ([place]) 10/24/2011  . Benign neoplasm of colon 10/03/2011  . Symptomatic Anemia  10/01/2011  . Tobacco abuse 10/01/2011  . Alcohol abuse 10/01/2011  . Dysphagia, unspecified(787.20) 10/01/2011    Orientation RESPIRATION BLADDER Height & Weight     Self, Time, Place  Normal Incontinent, External catheter Weight: 122 lb 9.2 oz (55.6 kg) Height:  5\' 10"  (177.8 cm)  BEHAVIORAL SYMPTOMS/MOOD NEUROLOGICAL BOWEL NUTRITION STATUS      Incontinent Diet(MECHANICAL SOFT DIET (DYSPHASIA III))  AMBULATORY STATUS  COMMUNICATION OF NEEDS Skin   Extensive Assist Verbally Normal                       Personal Care Assistance Level of Assistance  Bathing, Feeding, Dressing Bathing Assistance: Limited assistance Feeding assistance: Limited assistance(SOMETIMES PROMPTING) Dressing Assistance: Limited assistance     Functional Limitations Info  Sight, Hearing, Speech Sight Info: Adequate Hearing Info: Adequate Speech Info: Adequate    SPECIAL CARE FACTORS FREQUENCY  PT (By licensed PT), OT (By licensed OT), Speech therapy     PT Frequency: 5X OT Frequency: 5X     Speech Therapy Frequency: 1X      Contractures Contractures Info: Not present    Additional Factors Info  Code Status, Allergies Code Status Info: FULL CODE Allergies Info: NKA           Current Medications (03/24/2018):  This is the current hospital active medication list Current Facility-Administered Medications  Medication Dose Route Frequency Provider Last Rate Last Dose  . dextrose 5 %-0.9 % sodium chloride infusion   Intravenous Continuous Gardiner Barefoot, NP 75 mL/hr at 03/23/18 2242    . famotidine (PEPCID) tablet 20 mg  20 mg Oral BID Polly Cobia, RPH   20 mg at 03/23/18 2251  . feeding supplement (ENSURE ENLIVE) (ENSURE ENLIVE) liquid 237 mL  237 mL Oral TID BM Debbe Odea, MD   237 mL at 03/23/18 2057  . folic acid (FOLVITE) tablet 1 mg  1 mg Oral Daily Polly Cobia, RPH   1 mg at 03/23/18 1100  . LORazepam (ATIVAN)  injection 0-4 mg  0-4 mg Intravenous Q6H Kirby-Graham, Karsten Fells, NP       Followed by  . [START ON 03/25/2018] LORazepam (ATIVAN) injection 0-4 mg  0-4 mg Intravenous Q12H Kirby-Graham, Karsten Fells, NP      . LORazepam (ATIVAN) injection 1 mg  1 mg Intravenous Q6H PRN Gardiner Barefoot, NP   1 mg at 03/24/18 0054  . multivitamin liquid 15 mL  15 mL Per Tube Daily Debbe Odea, MD   15 mL at 03/23/18 1100  . nicotine (NICODERM CQ - dosed in mg/24 hours) patch 14 mg  14 mg Transdermal  Daily Charlynne Cousins, MD   14 mg at 03/23/18 1100  . ondansetron (ZOFRAN) tablet 4 mg  4 mg Oral Q6H PRN Charlynne Cousins, MD       Or  . ondansetron [facility]) injection 4 mg  4 mg Intravenous Q6H PRN Charlynne Cousins, MD   4 mg at 03/23/18 1745  . potassium & sodium phosphates (PHOS-NAK) 280-160-250 MG packet 1 packet  1 packet Oral TID WC & HS Debbe Odea, MD   1 packet at 03/23/18 2251  . sodium chloride flush (NS) 0.9 % injection 10-40 mL  10-40 mL Intracatheter Q12H Debbe Odea, MD   10 mL at 03/23/18 2252  . sodium chloride flush (NS) 0.9 % injection 10-40 mL  10-40 mL Intracatheter PRN Debbe Odea, MD      . thiamine (VITAMIN B-1) tablet 100 mg  100 mg Oral Daily Greta Doom, MD   100 mg at 03/23/18 1100  . vancomycin (VANCOCIN) 50 mg/mL oral solution 125 mg  125 mg Oral Q6H Rizwan, Saima, MD   125 mg at 03/24/18 0050   Or  . vancomycin (VANCOCIN) 500 mg in sodium chloride irrigation 0.9 % 100 mL ENEMA  500 mg Rectal Q6H Debbe Odea, MD         Discharge Medications: Please see discharge summary for a list of discharge medications.  Relevant Imaging Results:  Relevant Lab Results:   Additional Information SS# [id number]  Nila Nephew, LCSW

## 2018-03-24 NOTE — Progress Notes (Signed)
CRITICAL VALUE ALERT  Critical Value: CBG 61 Date & Time Notied:  03/24/2018 1850  Provider Notified: Dr Maylene Roes  Orders Received/Actions taken: Iv fluids started

## 2018-03-24 NOTE — Progress Notes (Addendum)
  Speech Language Pathology Treatment:    Patient Details Name: James [name] MRN: [id number] DOB: 1956-01-21 Today's Date: 03/24/2018 Time: 3785-8850 SLP Time Calculation (min) (ACUTE ONLY): 12 min  Assessment / Plan / Recommendation Clinical Impression  Pt today seen to assess po tolerance, educate pt and reinforce effective compensation strategies noted on MBS.  Note pt required NTS last evening!  Pt confused stating he is discharging home soon.  Wet voice noted at baseline that abates with verbal cues from this SLP to conduct throat clearing.  Pt likely having aspiration of his secretions ongoing.  Observed him with intake of graham cracker and Sprite.  Pt able to verbalize need to tuck his chin but generalization/carry over difficult and he required max cues for adequacy.   Intermittent coughing noted during intake despite modified chin tuck posture.  Aspiration at this time with this pt is not fully preventable.    Challenging situation as pt will NOT keep in short term feeding tube and is at increased aspiration risk due to his mentation/secretion aspiration regardless of nutrition options.  Recommend consider a palliative consult to establish his goals of care given 10 day hospital stay with ongoing concerns for aspiration.    In the interm time, modifying diet to clear or full liquids may decrease possible pulmonary risk with ongoing aspiration.    SlP paged MD following session and educated RN to results of MBS yesterday and mitigation strategies.  Also reviewed recommendation for palliative referral.     HPI HPI: 62 yo male adm to [facility] with symptomatic anemia, PMH + for etoh use, cachexia, former smoker, Hemoglobin 2 upon admit.  Swallow eval ordered due to pt having more difficulty.  Pt cxr showed persistent prominence ? edema, no consolidative pna.  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    Pt had MBS yesterday and diet was continued with precautins.  RN  reports pt not tolerating po well today - significant coughing with intake of medicine with pudding.  Per RN, pt required NT suctioning last night - today he is disoriented and voice was gurgly at baseline. Pt with ammonia level of 230 per lab staff.  Pt has been placed on diet by MD- he kept pulling out NG tube.        SLP Plan  Continue with current plan of care       Recommendations  Diet recommendations: Dysphagia 3 (mechanical soft);Thin liquid Liquids provided via: Cup;Straw Medication Administration: Crushed with puree(applesauce not pudding recommended) Supervision: Patient able to self feed Compensations: Slow rate;Small sips/bites;Follow solids with liquid;Chin tuck Postural Changes and/or Swallow Maneuvers: Seated upright 90 degrees;Upright 30-60 min after meal                Oral Care Recommendations: Oral care QID Follow up Recommendations: None SLP Visit Diagnosis: Dysphagia, oropharyngeal phase (R13.12) Plan: Continue with current plan of care       GO                [name] 03/24/2018, 3:05 PM   [name], [facility] SLP [phone]

## 2018-03-24 NOTE — Progress Notes (Signed)
Patient is currently combative with nursing staff. Ammonia level needs to be collected will attempt later.

## 2018-03-24 NOTE — Progress Notes (Signed)
PROGRESS NOTE    SY SAINTJEAN  WYO:[id number] DOB: 01-Sep-1956 DOA: 03/14/2018 PCP: Denita Lung, MD     Brief Narrative:  James [name] is a 62 y.o.malepast medical history recurrent severe anemia requiring multiple transfusions due to AVMs with chronic blood loss, alcohol abuse who came in for weakness, near syncope and was found to have a hemoglobin of 2. He was noted to be confused in the ED with Temp of 91.2, BP 92/54, WBC 43.000. He was admitted for blood transfusions, started on IV antibiotic cefepime. Due to diarrhea, C Diff was checked which returned positive. He was started on PO vanco via NGT. Due to acute encephalopathy, MRI was completed which revealed bilateral subdural hematomas vs intracranial hypotension vs pachymeningitis. Neurology was consulted and they have recommended LP.   New events last 24 hours / Subjective: Had CIWA scale 10-18 overnight, although he has been admitted for 10 days and clearly out of window for alcohol withdrawal. Score for tremor, anxiety, agitation, orientation and clouding of sensorium.   Assessment & Plan:   Principal Problem:   Symptomatic Anemia  Active Problems:   Tobacco abuse   Alcohol abuse   ILD (interstitial lung disease) (HCC)   Cachexia (HCC)   Severe protein-calorie malnutrition (HCC)   Iron deficiency anemia due to chronic blood loss   Symptomatic anemia   Lactic acidosis   Microcytic anemia   Thrombocytopenia (HCC)   Acute metabolic encephalopathy   Watery diarrhea   Symptomatic anemia  -Newly presented with hemoglobin of 2.  He has history of AVM.  FOBT have been negative x2.  He received total 6 units packed red blood cells.  Iron transfusion was also administered.   -Trend CBC, Hgb stable   Sepsis due to C diff colitis -Blood cultures negative  -Initially was given oral vancomycin through NG tube due to decreased level of alertness. NG tube was removed by patient. Now patient taking oral Vanco -Leukocytosis  resolved, afebrile   Acute toxic encephalopathy -Unclear etiology, initially thought to be delirium due to alcohol withdrawal.  He was managed with Ativan and CIWA scale.  MRI finally completed on 7/11 which revealed diffuse smooth dural thickening over the cerebral convexities. No significant mass effect. Findings probably represent thin subdural hematomas or intracranial hypotension, less likely pachymeningitis. -Neurology consulted -LP recommend, ordered on 7/12, still pending at this time. Spoke with RN to call fluoro department to check  -Check ammonia   Tobacco abuse -Nicotine patch  Alcohol abuse and withdrawal -Now out of window for withdrawal.  Patient was admitted on 7/7  Hepatomegaly with coagulopathy, thrombocytopenia  -?Secondary to alcohol abuse   Severe protein calorie malnutrition -Encourage oral intake  Dysphagia -Speech therapist recommending dysphagia 3 diet   DVT prophylaxis: SCDs Code Status: Full Family Communication: No family at bedside, spoke with wife over the phone  Disposition Plan: SNF when clinically improved    Consultants:   Neurology    Antimicrobials:  Anti-infectives (From admission, onward)   Start     Dose/Rate Route Frequency Ordered Stop   03/22/18 1600  metroNIDAZOLE (FLAGYL) tablet 500 mg  Status:  Discontinued     500 mg Oral Every 8 hours 03/22/18 1325 03/23/18 0809   03/21/18 0000  vancomycin (VANCOCIN) 50 mg/mL oral solution 125 mg     125 mg Oral Every 6 hours 03/20/18 2136 03/28/18 2359   03/21/18 0000  vancomycin (VANCOCIN) 500 mg in sodium chloride irrigation 0.9 % 100 mL ENEMA  500 mg Rectal Every 6 hours 03/20/18 2136 03/28/18 2359   03/19/18 2000  vancomycin (VANCOCIN) 500 mg in sodium chloride irrigation 0.9 % 100 mL ENEMA  Status:  Discontinued     500 mg Rectal Every 6 hours 03/19/18 1626 03/20/18 2136   03/19/18 1630  metroNIDAZOLE (FLAGYL) IVPB 500 mg  Status:  Discontinued     500 mg 100 mL/hr over 60  Minutes Intravenous Every 8 hours 03/19/18 1626 03/22/18 1325   03/17/18 1000  vancomycin (VANCOCIN) 50 mg/mL oral solution 125 mg  Status:  Discontinued     125 mg Oral 4 times daily 03/17/18 0739 03/20/18 2136   03/15/18 0800  ceFEPIme (MAXIPIME) 1 g in sodium chloride 0.9 % 100 mL IVPB  Status:  Discontinued     1 g 200 mL/hr over 30 Minutes Intravenous Every 8 hours 03/15/18 0603 03/17/18 0737   03/15/18 0130  ceFEPIme (MAXIPIME) 2 g in sodium chloride 0.9 % 100 mL IVPB     2 g 200 mL/hr over 30 Minutes Intravenous  Once 03/15/18 0117 03/15/18 0220        Objective: Vitals:   03/23/18 1700 03/24/18 0009 03/24/18 0226 03/24/18 0531  BP:  113/66  115/73  Pulse:  98  83  Resp:  20  17  Temp:  98.7 F (37.1 C)  98.2 F (36.8 C)  TempSrc:  Oral  Oral  SpO2:  97%  98%  Weight: 51.7 kg (114 lb)  55.6 kg (122 lb 9.2 oz)   Height:        Intake/Output Summary (Last 24 hours) at 03/24/2018 1311 Last data filed at 03/24/2018 0432 Gross per 24 hour  Intake 332.5 ml  Output 875 ml  Net -542.5 ml   Filed Weights   03/22/18 1700 03/23/18 1700 03/24/18 0226  Weight: 51.7 kg (114 lb) 51.7 kg (114 lb) 55.6 kg (122 lb 9.2 oz)    Examination:  General exam: Appears calm and comfortable, hand mittens in place  Respiratory system: Clear to auscultation. Respiratory effort normal. Cardiovascular system: S1 & S2 heard, RRR. No JVD, murmurs, rubs, gallops or clicks. No pedal edema. Gastrointestinal system: Abdomen is nondistended, soft and nontender. No organomegaly or masses felt. Normal bowel sounds heard. Central nervous system: Alert  Extremities: Symmetric Skin: No rashes, lesions or ulcers Psychiatry: Unclear if he is still confused or not, he has significant respiratory secretions which makes his speech incomprehensible   Data Reviewed: I have personally reviewed following labs and imaging studies  CBC: Recent Labs  Lab 03/20/18 0612 03/21/18 0500 03/22/18 0519  03/23/18 0612 03/24/18 0617  WBC 13.8* 14.3* 14.1* 12.5* 10.2  HGB 10.4* 10.6* 9.9* 9.5* 9.2*  HCT 31.5* 32.1* 29.8* 29.4* 28.2*  MCV 81.6 82.1 82.8 84.0 84.9  PLT 67* 71* 77* 80* 80*   Basic Metabolic Panel: Recent Labs  Lab 03/18/18 0427  03/20/18 0612 03/20/18 1205 03/21/18 0500 03/21/18 0555 03/22/18 0519 03/23/18 0612 03/23/18 1842 03/24/18 0617  NA 139   < > 137  --  139  --  135 131* 134*  --   K 3.2*   < > 3.6  --  3.7  --  3.3* 3.8 3.7  --   CL 110   < > 114*  --  114*  --  110 109 112*  --   CO2 21*   < > 19*  --  19*  --  18* 19* 16*  --   GLUCOSE 125*   < >  114*  --  95  --  103* 92 81  --   BUN <5*   < > <5*  --  <5*  --  <5* <5* <5*  --   CREATININE 0.34*   < > 0.33*  --  <0.30*  --  0.34* 0.32* 0.34*  --   CALCIUM 9.0   < > 7.8*  --  7.4*  --  7.2* 7.2* 6.7*  --   MG 1.8  --   --   --   --   --   --   --   --   --   PHOS <1.0*   < >  --  0.7*  --  2.1* 1.5* 1.9*  --  2.1*   < > = values in this interval not displayed.   GFR: Estimated Creatinine Clearance: 77.2 mL/min (A) (by C-G formula based on SCr of 0.34 mg/dL (L)). Liver Function Tests: Recent Labs  Lab 03/21/18 0500 03/23/18 1842  AST 36 29  ALT 44 26  ALKPHOS 152* 121  BILITOT 3.3* 2.8*  PROT 5.4* 4.8*  ALBUMIN 2.2* 1.9*   No results for input(s): LIPASE, AMYLASE in the last 168 hours. No results for input(s): AMMONIA in the last 168 hours. Coagulation Profile: Recent Labs  Lab 03/23/18 1842  INR 1.89   Cardiac Enzymes: No results for input(s): CKTOTAL, CKMB, CKMBINDEX, TROPONINI in the last 168 hours. BNP (last 3 results) No results for input(s): PROBNP in the last 8760 hours. HbA1C: No results for input(s): HGBA1C in the last 72 hours. CBG: Recent Labs  Lab 03/23/18 2125 03/24/18 0006 03/24/18 0419 03/24/18 0738 03/24/18 1259  GLUCAP 64* 87 78 78 74   Lipid Profile: No results for input(s): CHOL, HDL, LDLCALC, TRIG, CHOLHDL, LDLDIRECT in the last 72 hours. Thyroid  Function Tests: No results for input(s): TSH, T4TOTAL, FREET4, T3FREE, THYROIDAB in the last 72 hours. Anemia Panel: No results for input(s): VITAMINB12, FOLATE, FERRITIN, TIBC, IRON, RETICCTPCT in the last 72 hours. Sepsis Labs: No results for input(s): PROCALCITON, LATICACIDVEN in the last 168 hours.  Recent Results (from the past 240 hour(s))  Culture, blood (routine x 2)     Status: None   Collection Time: 03/14/18  7:04 PM  Result Value Ref Range Status   Specimen Description   Final    BLOOD BLOOD RIGHT FOREARM Performed at [facility] [address] [place], [place] [address]    Special Requests   Final    BOTTLES DRAWN AEROBIC AND ANAEROBIC Blood Culture adequate volume Performed at [facility] [address], [place], [place] [address]    Culture   Final    NO GROWTH 5 DAYS Performed at [facility], [place] [address] [place], [place] [address]    Report Status 03/20/2018 FINAL  Final  Culture, blood (routine x 2)     Status: None   Collection Time: 03/14/18  7:09 PM  Result Value Ref Range Status   Specimen Description   Final    BLOOD BLOOD RIGHT FOREARM Performed at [facility] [address], [place], [place] [address]    Special Requests   Final    BOTTLES DRAWN AEROBIC AND ANAEROBIC Blood Culture adequate volume Performed at [facility] [address] [place],  [address]    Culture   Final    NO GROWTH 5 DAYS Performed at [facility], [place] [address], [place], [place]  [address]    Report Status 03/20/2018 FINAL  Final  Urine culture     Status: Abnormal   Collection Time: 03/14/18  7:47 PM  Result Value Ref Range Status   Specimen Description   Final    URINE, CLEAN CATCH Performed at [facility], [place] [address] [place], [place] [address]    Special Requests   Final    Normal Performed at [facility], [place]  [address] [place], [place] [address]    Culture MULTIPLE SPECIES PRESENT, SUGGEST RECOLLECTION (A)  Final   Report Status 03/16/2018 FINAL  Final  C difficile quick scan w PCR reflex     Status: Abnormal   Collection Time: 03/16/18 11:17 AM  Result Value Ref Range Status   C Diff antigen POSITIVE (A) NEGATIVE Final   C Diff toxin NEGATIVE NEGATIVE Final   C Diff interpretation Results are indeterminate. See PCR results.  Final    Comment: Performed at [facility], [place] [address] [place], [place] [address]  C. Diff by PCR, Reflexed     Status: Abnormal   Collection Time: 03/16/18 11:17 AM  Result Value Ref Range Status   Toxigenic C. Difficile by PCR POSITIVE (A) NEGATIVE Final    Comment: Positive for toxigenic C. difficile with little to no toxin production. Only treat if clinical presentation suggests symptomatic illness. Performed at [facility], [place] [address], [place], [place] [address]        Radiology Studies: Dg Swallowing Func-speech Pathology  Result Date: 03/23/2018 Objective Swallowing Evaluation: Type of Study: MBS-Modified Barium Swallow Study  Patient Details Name: James [name] MRN: [id number] Date of Birth: 1956/03/14 Today's Date: 03/23/2018 Time: SLP Start Time (ACUTE ONLY): 0845 -SLP Stop Time (ACUTE ONLY): 0912 SLP Time Calculation (min) (ACUTE ONLY): 27 min Past Medical History: Past Medical History: Diagnosis Date . Alcohol abuse  . Anemia due to GI blood loss 09/2011  microcytic.  transfused for Hgb 3.5, MCV in 50s.  . Aortic insufficiency  . Benign neoplasm of colon  . Black stools 08/21/2015 . Cachexia ([place])  . Chest pain 08/21/2015 . Dysphagia  . ED (erectile dysfunction)  . Edema  . GERD (gastroesophageal reflux disease)  . ILD (interstitial lung disease) ([place])  . Iron deficiency anemia, unspecified  . Pulmonary nodule  . Reflux esophagitis  . Smoker  . Tobacco abuse  . Weight loss, abnormal  Past Surgical History: Past Surgical History:  Procedure Laterality Date . COLONOSCOPY  10/03/2011  Procedure: COLONOSCOPY;  Surgeon: Scarlette Shorts, MD;  Location: [facility];  Service: Endoscopy;  Laterality: N/A; . COLONOSCOPY N/A 09/13/2017  Procedure: COLONOSCOPY;  Surgeon: Carol Ada, MD;  Location: [place];  Service: Endoscopy;  Laterality: N/A; . COLONOSCOPY WITH PROPOFOL N/A 08/22/2015  Procedure: COLONOSCOPY WITH PROPOFOL;  Surgeon: Wilford Corner, MD;  Location: [facility] ENDOSCOPY;  Service: Endoscopy;  Laterality: N/A; . ENTEROSCOPY N/A 09/13/2017  Procedure: ENTEROSCOPY;  Surgeon: Carol Ada, MD;  Location: [facility] ENDOSCOPY;  Service: Endoscopy;  Laterality: N/A; . ESOPHAGOGASTRODUODENOSCOPY  10/02/2011  Procedure: ESOPHAGOGASTRODUODENOSCOPY (EGD);  Surgeon: Scarlette Shorts, MD;  Location: [facility] ENDOSCOPY;  Service: Endoscopy;  Laterality: N/A; . ESOPHAGOGASTRODUODENOSCOPY N/A 01/05/2015  Procedure: ESOPHAGOGASTRODUODENOSCOPY (EGD);  Surgeon: Inda Castle, MD;  Location: [facility];  Service: Endoscopy;  Laterality: N/A; . ESOPHAGOGASTRODUODENOSCOPY (EGD) WITH PROPOFOL N/A 08/22/2015  Procedure: ESOPHAGOGASTRODUODENOSCOPY (EGD) WITH PROPOFOL;  Surgeon: Wilford Corner, MD;  Location: [facility] ENDOSCOPY;  Service: Endoscopy;  Laterality: N/A; . ESOPHAGOGASTRODUODENOSCOPY (EGD) WITH PROPOFOL  N/A 09/19/2016  Procedure: ESOPHAGOGASTRODUODENOSCOPY (EGD) WITH PROPOFOL;  Surgeon: Carol Ada, MD;  Location: [facility] ENDOSCOPY;  Service: Endoscopy;  Laterality: N/A; . GIVENS CAPSULE STUDY N/A 09/19/2016  Procedure: GIVENS CAPSULE STUDY;  Surgeon: Carol Ada, MD;  Location: [facility];  Service: Endoscopy;  Laterality: N/A; . GIVENS CAPSULE STUDY N/A 09/11/2017  Procedure: GIVENS CAPSULE STUDY;  Surgeon: Carol Ada, MD;  Location: [facility];  Service: Endoscopy;  Laterality: N/A; HPI: 62 yo male adm to [facility] with symptomatic anemia, PMH + for etoh use, cachexia, former smoker, Hemoglobin 2 upon admit.  Swallow eval ordered due to pt having more difficulty.  Pt cxr showed  persistent prominence ? edema, no consolidative pna.  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  Dysphagia presents as decreased oral coordination resulting in piecemealing and delayed transiting.  Phayrngeal swallow characterized by decreased epiglottic deflection and laryngeal elevation resulting in residuals t/o pharynx.  Solids/puree residuals worse than liquids.  Chin tuck posture decreased accumulation of residuals with solids as well as consuming liquids.  He does NOT sense residuals however which increases his risk.  Pt did not cough or clear his throat during tested.  No aspiration despite cues to consume thin liquids sequentially with head neutral.  Pt piecemeals and swallows multiple times with each bolus protecting his airway.  Therapeutic intervention including determining strategies to compensate for dysphagia and reinforcing using teach back and video screen.  Recommend continue soft/thin with precautions.  SLP Visit Diagnosis Dysphagia, oropharyngeal phase (R13.12) Attention and concentration deficit following -- Frontal lobe and executive function deficit following -- Impact on safety and function Moderate aspiration risk   CHL IP TREATMENT RECOMMENDATION 03/23/2018 Treatment Recommendations Therapy as outlined in treatment plan below   Prognosis 03/23/2018 Prognosis for Safe Diet Advancement Fair Barriers to Reach Goals Other (Comment) Barriers/Prognosis Comment -- CHL IP DIET RECOMMENDATION 03/23/2018 SLP Diet Recommendations Dysphagia 3 (Mech soft) solids;Thin liquid Liquid Administration via Cup;Straw Medication Administration Whole meds with liquid Compensations Slow rate;Small sips/bites;Other (Comment);Chin tuck;Follow solids  with liquid;Multiple dry swallows after each bite/sip Postural Changes Seated upright at 90 degrees;Remain semi-upright after after feeds/meals (Comment)   CHL IP OTHER RECOMMENDATIONS 03/23/2018 Recommended Consults -- Oral Care Recommendations Oral care BID Other Recommendations --   CHL IP FOLLOW UP RECOMMENDATIONS 03/23/2018 Follow up Recommendations (No Data)   CHL IP FREQUENCY AND DURATION 03/23/2018 Speech Therapy Frequency (ACUTE ONLY) min 1 x/week Treatment Duration 1 week      CHL IP ORAL PHASE 03/23/2018 Oral Phase Impaired Oral - Pudding Teaspoon -- Oral - Pudding Cup -- Oral - Honey Teaspoon -- Oral - Honey Cup -- Oral - Nectar Teaspoon -- Oral - Nectar Cup Weak lingual manipulation Oral - Nectar Straw -- Oral - Thin Teaspoon Weak lingual manipulation;Reduced posterior propulsion Oral - Thin Cup Weak lingual manipulation;Reduced posterior propulsion;Piecemeal swallowing Oral - Thin Straw Weak lingual manipulation;Reduced posterior propulsion;Piecemeal swallowing Oral - Puree Weak lingual manipulation;Reduced posterior propulsion;Piecemeal swallowing Oral - Mech Soft -- Oral - Regular Weak lingual manipulation;Reduced posterior propulsion;Impaired mastication;Delayed oral transit Oral - Multi-Consistency -- Oral - Pill Weak lingual manipulation;Reduced posterior propulsion;Delayed oral transit Oral Phase - Comment chin tuck posture tested with pill with puree  CHL IP PHARYNGEAL PHASE 03/23/2018 Pharyngeal Phase Impaired Pharyngeal- Pudding Teaspoon -- Pharyngeal --  Pharyngeal- Pudding Cup -- Pharyngeal -- Pharyngeal- Honey Teaspoon -- Pharyngeal -- Pharyngeal- Honey Cup -- Pharyngeal -- Pharyngeal- Nectar Teaspoon -- Pharyngeal -- Pharyngeal- Nectar Cup Pharyngeal residue - pyriform;Pharyngeal residue - valleculae Pharyngeal -- Pharyngeal- Nectar Straw -- Pharyngeal -- Pharyngeal- Thin Teaspoon Pharyngeal residue - valleculae;Pharyngeal residue - pyriform Pharyngeal -- Pharyngeal- Thin Cup Pharyngeal residue  - valleculae;Pharyngeal residue - pyriform Pharyngeal -- Pharyngeal- Thin Straw Pharyngeal residue - valleculae;Pharyngeal residue - pyriform Pharyngeal -- Pharyngeal- Puree Reduced tongue base retraction;Pharyngeal residue - valleculae;Pharyngeal residue - posterior pharnyx Pharyngeal -- Pharyngeal- Mechanical Soft -- Pharyngeal -- Pharyngeal- Regular Reduced tongue base retraction;Pharyngeal residue - valleculae Pharyngeal -- Pharyngeal- Multi-consistency -- Pharyngeal -- Pharyngeal- Pill Reduced tongue base retraction;Pharyngeal residue - valleculae Pharyngeal -- Pharyngeal Comment no aspiration or penetration despite pt being cued to sequentially swallow thin liquids sequentially  CHL IP CERVICAL ESOPHAGEAL PHASE 03/23/2018 Cervical Esophageal Phase Impaired Pudding Teaspoon -- Pudding Cup -- Honey Teaspoon -- Honey Cup -- Nectar Teaspoon -- Nectar Cup -- Nectar Straw -- Thin Teaspoon -- Thin Cup -- Thin Straw -- Puree -- Mechanical Soft -- Regular -- Multi-consistency -- Pill -- Cervical Esophageal Comment barium tablet given with pudding appeared to lodge at distal esophagus without pt awareness, thin barium did not clear it however water intake faciliated clearance, radiologist not present to confirm findings, again pt not sensate to residuals [name], [facility] SLP [phone] No flowsheet data found. [name] 03/23/2018, 4:18 PM                 Scheduled Meds: . feeding supplement (ENSURE ENLIVE)  237 mL Oral TID BM  . folic acid  1 mg Oral Daily  . LORazepam  0-4 mg Intravenous Q6H   Followed by  . [START ON 03/25/2018] LORazepam  0-4 mg Intravenous Q12H  . multivitamin  15 mL Per Tube Daily  . nicotine  14 mg Transdermal Daily  . potassium & sodium phosphates  1 packet Oral TID WC & HS  . sodium chloride flush  10-40 mL Intracatheter Q12H  . thiamine  100 mg Oral Daily  . vancomycin  125 mg Oral Q6H   Or  . vancomycin (VANCOCIN) rectal ENEMA  500 mg Rectal Q6H   Continuous  Infusions: . dextrose 5 % and 0.9% NaCl 75 mL/hr at 03/24/18 1115     LOS: 10 days    Time spent: 45 minutes   Dessa Phi, DO [facility] [email or web address] Password [email or web address] 03/24/2018, 1:11 PM

## 2018-03-25 LAB — CBC
HCT: 28.7 % — ABNORMAL LOW (ref 39.0–52.0)
Hemoglobin: 9.3 g/dL — ABNORMAL LOW (ref 13.0–17.0)
MCH: 27.2 pg (ref 26.0–34.0)
MCHC: 32.4 g/dL (ref 30.0–36.0)
MCV: 83.9 fL (ref 78.0–100.0)
PLATELETS: 114 10*3/uL — AB (ref 150–400)
RBC: 3.42 MIL/uL — AB (ref 4.22–5.81)
RDW: 28.4 % — ABNORMAL HIGH (ref 11.5–15.5)
WBC: 11.7 10*3/uL — AB (ref 4.0–10.5)

## 2018-03-25 LAB — BASIC METABOLIC PANEL
ANION GAP: 5 (ref 5–15)
CO2: 19 mmol/L — ABNORMAL LOW (ref 22–32)
Calcium: 7.4 mg/dL — ABNORMAL LOW (ref 8.9–10.3)
Chloride: 108 mmol/L (ref 98–111)
Creatinine, Ser: 0.37 mg/dL — ABNORMAL LOW (ref 0.61–1.24)
GFR calc Af Amer: >60 mL/min (ref >60)
Glucose, Bld: 78 mg/dL (ref 70–99)
POTASSIUM: 2.9 mmol/L — AB (ref 3.5–5.1)
SODIUM: 132 mmol/L — AB (ref 135–145)

## 2018-03-25 LAB — GLUCOSE, CAPILLARY
GLUCOSE-CAPILLARY: 81 mg/dL (ref 70–99)
GLUCOSE-CAPILLARY: 80 mg/dL (ref 70–99)
GLUCOSE-CAPILLARY: 81 mg/dL (ref 70–99)
Glucose-Capillary: 78 mg/dL (ref 70–99)
Glucose-Capillary: 79 mg/dL (ref 70–99)
Glucose-Capillary: 98 mg/dL (ref 70–99)

## 2018-03-25 MED ORDER — POTASSIUM CHLORIDE 20 MEQ/15ML (10%) PO SOLN
40.0000 meq | Freq: Two times a day (BID) | ORAL | Status: AC
  Administered 2018-03-25 (×2): 40 meq via ORAL
  Filled 2018-03-25 (×2): qty 30

## 2018-03-25 NOTE — Progress Notes (Signed)
Physical Therapy Treatment Patient Details Name: James [name] MRN: [id number] DOB: Sep 01, 1956 Today's Date: 03/25/2018    History of Present Illness James [name] is an 62 y.o. 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  He was brought in because of progressive weakness near syncope and confusion    PT Comments    +2 mod assist for sit to stand, and to take several pivotal steps to recliner with RW. Pt leans posteriorly in sitting and standing and requires assist to maintain balance, this did not occur during previous PT session on 03/23/18. He has decreased awareness of his balance deficit and is unsafe to ambulate at present. Pt again was found lying in large amount of diarrhea. He may benefit from rectal tube to minimize further skin breakdown.    Follow Up Recommendations  SNF;Supervision/Assistance - 24 hour     Equipment Recommendations  None recommended by PT    Recommendations for Other Services       Precautions / Restrictions Precautions Precautions: Fall Restrictions Weight Bearing Restrictions: No    Mobility  Bed Mobility   Bed Mobility: Supine to Sit     Supine to sit: Mod assist     General bed mobility comments: mod assist to raise trunk and advance BLEs  Transfers Overall transfer level: Needs assistance Equipment used: Rolling walker (2 wheeled) Transfers: Sit to/from [employer] Transfers Sit to Stand: +2 safety/equipment;Mod assist;+2 physical assistance Stand pivot transfers: Mod assist;+2 physical assistance;+2 safety/equipment       General transfer comment: posterior lean in sitting and standing with RW, +2 assist to prevent fall, assist to rise and for balance during SPT with RW  Ambulation/Gait             General Gait Details: unsafe to attempt 2* poor  balance   Stairs             Wheelchair Mobility    Modified Rankin (Stroke Patients Only)       Balance Overall balance assessment: Needs assistance Sitting-balance support: Feet supported;Single extremity supported Sitting balance-Leahy Scale: Zero Sitting balance - Comments: posterior lean intermittently, able to maintain neutral when holding bedrail with RUE, decreased awareness of balance deficits Postural control: Posterior lean Standing balance support: Bilateral upper extremity supported Standing balance-Leahy Scale: Zero Standing balance comment: posterior lean requiring mod +2 assist with RW                            Cognition Arousal/Alertness: Awake/alert Behavior During Therapy: Flat affect Overall Cognitive Status: No family/caregiver present to determine baseline cognitive functioning Area of Impairment: Safety/judgement;Awareness                 Orientation Level: Disoriented to;Situation     Following Commands: Follows one step commands consistently Safety/Judgement: Decreased awareness of safety;Decreased awareness of deficits     General Comments: followed commands today, oriented to self and location      Exercises      General Comments        Pertinent Vitals/Pain Faces Pain Scale: Hurts whole lot Pain Location: perineal area during pericare Pain Descriptors / Indicators: Grimacing Pain Intervention(s): Limited activity within patient's tolerance;Monitored during session;Repositioned    Home Living  Prior Function            PT Goals (current goals can now be found in the care plan section) Acute Rehab PT Goals PT Goal Formulation: With patient Time For Goal Achievement: 03/30/18 Potential to Achieve Goals: Fair Progress towards PT goals: Not progressing toward goals - comment(decline in mobility, worsening balance deficits today)    Frequency    Min 2X/week      PT  Plan Current plan remains appropriate    Co-evaluation              AM-PAC PT "6 Clicks" Daily Activity  Outcome Measure  Difficulty turning over in bed (including adjusting bedclothes, sheets and blankets)?: Unable Difficulty moving from lying on back to sitting on the side of the bed? : Unable Difficulty sitting down on and standing up from a chair with arms (e.g., wheelchair, bedside commode, etc,.)?: Unable Help needed moving to and from a bed to chair (including a wheelchair)?: A Lot Help needed walking in hospital room?: Total Help needed climbing 3-5 steps with a railing? : Total 6 Click Score: 7    End of Session Equipment Utilized During Treatment: Gait belt Activity Tolerance: Patient tolerated treatment well Patient left: in chair;with chair alarm set;with call bell/phone within reach Nurse Communication: Mobility status PT Visit Diagnosis: Difficulty in walking, not elsewhere classified (R26.2);Muscle weakness (generalized) (M62.81);Adult, failure to thrive (R62.7)     Time: 3967-2897 PT Time Calculation (min) (ACUTE ONLY): 23 min  Charges:  $Therapeutic Activity: 23-37 mins                    G Codes:          James [name] 03/25/2018, 11:31 AM [phone]

## 2018-03-25 NOTE — Progress Notes (Signed)
RT called by staff per pts. wife requesting  that pt. needs suctioning, upon arrival wife/family member at bedside, RT auscultated b.s., noted pt, to be clear and just mildly grunting/vocally and stops when chin elevated from chest, pt. is alert when aroused, prn NTS order remains.

## 2018-03-25 NOTE — Progress Notes (Addendum)
PROGRESS NOTE    James [name]  MWU:[id number] DOB: Sep 06, 1956 DOA: 03/14/2018 PCP: Denita Lung, MD     Brief Narrative:  James [name] is a 62 y.o.malepast medical history recurrent severe anemia requiring multiple transfusions due to AVMs with chronic blood loss, alcohol abuse who came in for weakness, near syncope and was found to have a hemoglobin of 2. He was noted to be confused in the ED with Temp of 91.2, BP 92/54, WBC 43.000. He was admitted for blood transfusions, started on IV antibiotic cefepime. Due to diarrhea, C Diff was checked which returned positive. He was started on PO vanco via NGT. Due to acute encephalopathy, MRI was completed which revealed bilateral subdural hematomas vs intracranial hypotension vs pachymeningitis. Neurology was consulted and they have recommended LP, which has not been completed due to patient's agitation.   New events last 24 hours / Subjective: No new issues, patient more alert and interactive this morning. Oriented to self, [facility] (but not hospital), 2009. He is able to tell me his wife's name. No new complaints, states he doesn't eat because he doesn't like the food here.   Assessment & Plan:   Principal Problem:   Symptomatic Anemia  Active Problems:   Tobacco abuse   Alcohol abuse   ILD (interstitial lung disease) (HCC)   Cachexia (HCC)   Severe protein-calorie malnutrition (HCC)   Iron deficiency anemia due to chronic blood loss   Symptomatic anemia   Lactic acidosis   Microcytic anemia   Thrombocytopenia (HCC)   Acute metabolic encephalopathy   Watery diarrhea   Symptomatic anemia  -Newly presented with hemoglobin of 2.  He has history of AVM.  FOBT have been negative x2.  He received total 6 units packed red blood cells.  Iron transfusion was also administered.   -Trend CBC, Hgb stable   Sepsis due to C diff colitis -Blood cultures negative  -Initially was given oral vancomycin through NG tube due to decreased  level of alertness. NG tube was removed by patient. Now patient taking oral Vanco  Acute toxic encephalopathy -Unclear etiology, initially thought to be delirium due to alcohol withdrawal.  He was managed with Ativan and CIWA scale.  MRI finally completed on 7/11 which revealed diffuse smooth dural thickening over the cerebral convexities. No significant mass effect. Findings probably represent thin subdural hematomas or intracranial hypotension, less likely pachymeningitis. -Neurology consulted -LP recommended, ordered on 7/12, unable to complete due to patient's lack of cooperation, agitation -Ammonia normal -Waxing and waning, but more alert than reported last week   Tobacco abuse -Nicotine patch  Alcohol abuse and withdrawal -Now out of window for withdrawal.  Patient was admitted on 7/7  Hepatomegaly with coagulopathy, thrombocytopenia  -?Secondary to alcohol abuse   Severe protein calorie malnutrition -Encourage oral intake  Dysphagia -Speech therapist recommending dysphagia 3 diet or full liquid diet due to continued risk of aspiration   Hypokalemia -Replace, trend   Goals of care -Patient has been here >10 days, altered mentation, waxing/waning, dysphagia and aspiration risk, poor PO intake, requiring IV ativan for agitation, without any big improvement. Discussed with wife that if he continues on this projectory, he may not have meaningful recovery for home or SNF. Also discussed feeding tube is an aggressive option and may not necessarily increase quality of life, he may attempt to pull out tube, may still aspirate with tube feedings etc   DVT prophylaxis: SCDs Code Status: Full Family Communication: No family at bedside, spoke  with wife over the phone 7/18  Disposition Plan: SNF when clinically improved    Consultants:   Neurology   Palliative care   Antimicrobials:  Anti-infectives (From admission, onward)   Start     Dose/Rate Route Frequency Ordered  Stop   03/22/18 1600  metroNIDAZOLE (FLAGYL) tablet 500 mg  Status:  Discontinued     500 mg Oral Every 8 hours 03/22/18 1325 03/23/18 0809   03/21/18 0000  vancomycin (VANCOCIN) 50 mg/mL oral solution 125 mg     125 mg Oral Every 6 hours 03/20/18 2136 03/28/18 2359   03/21/18 0000  vancomycin (VANCOCIN) 500 mg in sodium chloride irrigation 0.9 % 100 mL ENEMA     500 mg Rectal Every 6 hours 03/20/18 2136 03/28/18 2359   03/19/18 2000  vancomycin (VANCOCIN) 500 mg in sodium chloride irrigation 0.9 % 100 mL ENEMA  Status:  Discontinued     500 mg Rectal Every 6 hours 03/19/18 1626 03/20/18 2136   03/19/18 1630  metroNIDAZOLE (FLAGYL) IVPB 500 mg  Status:  Discontinued     500 mg 100 mL/hr over 60 Minutes Intravenous Every 8 hours 03/19/18 1626 03/22/18 1325   03/17/18 1000  vancomycin (VANCOCIN) 50 mg/mL oral solution 125 mg  Status:  Discontinued     125 mg Oral 4 times daily 03/17/18 0739 03/20/18 2136   03/15/18 0800  ceFEPIme (MAXIPIME) 1 g in sodium chloride 0.9 % 100 mL IVPB  Status:  Discontinued     1 g 200 mL/hr over 30 Minutes Intravenous Every 8 hours 03/15/18 0603 03/17/18 0737   03/15/18 0130  ceFEPIme (MAXIPIME) 2 g in sodium chloride 0.9 % 100 mL IVPB     2 g 200 mL/hr over 30 Minutes Intravenous  Once 03/15/18 0117 03/15/18 0220       Objective: Vitals:   03/24/18 1725 03/24/18 2028 03/25/18 0618 03/25/18 0624  BP: 112/70 109/80  104/60  Pulse: 80 (!) 113  (!) 104  Resp: 18 14  18   Temp: 98 F (36.7 C) 97.9 F (36.6 C)  98.3 F (36.8 C)  TempSrc: Oral Oral  Oral  SpO2: 99% 95%  100%  Weight:   51.2 kg (112 lb 14 oz)   Height:        Intake/Output Summary (Last 24 hours) at 03/25/2018 1101 Last data filed at 03/25/2018 3810 Gross per 24 hour  Intake 911.25 ml  Output 765 ml  Net 146.25 ml   Filed Weights   03/23/18 1700 03/24/18 0226 03/25/18 0618  Weight: 51.7 kg (114 lb) 55.6 kg (122 lb 9.2 oz) 51.2 kg (112 lb 14 oz)    Examination: General exam:  Appears calm and comfortable  Respiratory system: Clear to auscultation. Respiratory effort normal. Cardiovascular system: S1 & S2 heard, RRR. No JVD, murmurs, rubs, gallops or clicks. No pedal edema. Gastrointestinal system: Abdomen is nondistended, soft and nontender. No organomegaly or masses felt. Normal bowel sounds heard. Central nervous system: Alert and oriented to self Extremities: Symmetric  Skin: No rashes, lesions or ulcers   Data Reviewed: I have personally reviewed following labs and imaging studies  CBC: Recent Labs  Lab 03/21/18 0500 03/22/18 0519 03/23/18 0612 03/24/18 0617 03/25/18 0633  WBC 14.3* 14.1* 12.5* 10.2 11.7*  HGB 10.6* 9.9* 9.5* 9.2* 9.3*  HCT 32.1* 29.8* 29.4* 28.2* 28.7*  MCV 82.1 82.8 84.0 84.9 83.9  PLT 71* 77* 80* 80* 175*   Basic Metabolic Panel: Recent Labs  Lab 03/20/18 1205 03/21/18 0500  03/21/18 0555 03/22/18 0519 03/23/18 0612 03/23/18 1842 03/24/18 0617 03/25/18 0633  NA  --  139  --  135 131* 134*  --  132*  K  --  3.7  --  3.3* 3.8 3.7  --  2.9*  CL  --  114*  --  110 109 112*  --  108  CO2  --  19*  --  18* 19* 16*  --  19*  GLUCOSE  --  95  --  103* 92 81  --  78  BUN  --  <5*  --  <5* <5* <5*  --  <5*  CREATININE  --  <0.30*  --  0.34* 0.32* 0.34*  --  0.37*  CALCIUM  --  7.4*  --  7.2* 7.2* 6.7*  --  7.4*  PHOS 0.7*  --  2.1* 1.5* 1.9*  --  2.1*  --    GFR: Estimated Creatinine Clearance: 71.1 mL/min (A) (by C-G formula based on SCr of 0.37 mg/dL (L)). Liver Function Tests: Recent Labs  Lab 03/21/18 0500 03/23/18 1842  AST 36 29  ALT 44 26  ALKPHOS 152* 121  BILITOT 3.3* 2.8*  PROT 5.4* 4.8*  ALBUMIN 2.2* 1.9*   No results for input(s): LIPASE, AMYLASE in the last 168 hours. Recent Labs  Lab 03/24/18 1908  AMMONIA 33   Coagulation Profile: Recent Labs  Lab 03/23/18 1842  INR 1.89   Cardiac Enzymes: No results for input(s): CKTOTAL, CKMB, CKMBINDEX, TROPONINI in the last 168 hours. BNP (last 3  results) No results for input(s): PROBNP in the last 8760 hours. HbA1C: No results for input(s): HGBA1C in the last 72 hours. CBG: Recent Labs  Lab 03/24/18 1837 03/24/18 2022 03/24/18 2310 03/25/18 0459 03/25/18 0801  GLUCAP 61* 67* 81 81 80   Lipid Profile: No results for input(s): CHOL, HDL, LDLCALC, TRIG, CHOLHDL, LDLDIRECT in the last 72 hours. Thyroid Function Tests: No results for input(s): TSH, T4TOTAL, FREET4, T3FREE, THYROIDAB in the last 72 hours. Anemia Panel: No results for input(s): VITAMINB12, FOLATE, FERRITIN, TIBC, IRON, RETICCTPCT in the last 72 hours. Sepsis Labs: No results for input(s): PROCALCITON, LATICACIDVEN in the last 168 hours.  Recent Results (from the past 240 hour(s))  C difficile quick scan w PCR reflex     Status: Abnormal   Collection Time: 03/16/18 11:17 AM  Result Value Ref Range Status   C Diff antigen POSITIVE (A) NEGATIVE Final   C Diff toxin NEGATIVE NEGATIVE Final   C Diff interpretation Results are indeterminate. See PCR results.  Final    Comment: Performed at [facility], [place] [address] [place], [place] [address]  C. Diff by PCR, Reflexed     Status: Abnormal   Collection Time: 03/16/18 11:17 AM  Result Value Ref Range Status   Toxigenic C. Difficile by PCR POSITIVE (A) NEGATIVE Final    Comment: Positive for toxigenic C. difficile with little to no toxin production. Only treat if clinical presentation suggests symptomatic illness. Performed at [facility], [place] [address], [place], [place] [address]        Radiology Studies: No results found.    Scheduled Meds: . feeding supplement (ENSURE ENLIVE)  237 mL Oral TID BM  . folic acid  1 mg Oral Daily  . multivitamin  15 mL Per Tube Daily  . nicotine  14 mg Transdermal Daily  . potassium chloride  40 mEq Oral BID  . sodium chloride flush  10-40  mL Intracatheter Q12H  . thiamine  100 mg Oral Daily  . vancomycin  125 mg Oral Q6H   Or  .  vancomycin (VANCOCIN) rectal ENEMA  500 mg Rectal Q6H   Continuous Infusions: . dextrose 5 % and 0.45% NaCl 75 mL/hr at 03/25/18 1026     LOS: 11 days    Time spent: 45 minutes   Dessa Phi, DO [facility] [email or web address] Password [email or web address] 03/25/2018, 11:01 AM

## 2018-03-26 LAB — BASIC METABOLIC PANEL
Anion gap: 7 (ref 5–15)
CHLORIDE: 107 mmol/L (ref 98–111)
CO2: 21 mmol/L — AB (ref 22–32)
Calcium: 7.7 mg/dL — ABNORMAL LOW (ref 8.9–10.3)
GLUCOSE: 117 mg/dL — AB (ref 70–99)
POTASSIUM: 2.8 mmol/L — AB (ref 3.5–5.1)
Sodium: 135 mmol/L (ref 135–145)

## 2018-03-26 LAB — GLUCOSE, CAPILLARY
GLUCOSE-CAPILLARY: 111 mg/dL — AB (ref 70–99)
GLUCOSE-CAPILLARY: 95 mg/dL (ref 70–99)
Glucose-Capillary: 107 mg/dL — ABNORMAL HIGH (ref 70–99)
Glucose-Capillary: 73 mg/dL (ref 70–99)
Glucose-Capillary: 74 mg/dL (ref 70–99)

## 2018-03-26 LAB — CBC
HEMATOCRIT: 29.4 % — AB (ref 39.0–52.0)
HEMOGLOBIN: 9.4 g/dL — AB (ref 13.0–17.0)
MCH: 26.9 pg (ref 26.0–34.0)
MCHC: 32 g/dL (ref 30.0–36.0)
MCV: 84 fL (ref 78.0–100.0)
Platelets: 136 10*3/uL — ABNORMAL LOW (ref 150–400)
RBC: 3.5 MIL/uL — AB (ref 4.22–5.81)
RDW: 27.2 % — ABNORMAL HIGH (ref 11.5–15.5)
WBC: 9.1 10*3/uL (ref 4.0–10.5)

## 2018-03-26 LAB — MAGNESIUM: MAGNESIUM: 1.3 mg/dL — AB (ref 1.7–2.4)

## 2018-03-26 MED ORDER — ADULT MULTIVITAMIN LIQUID CH
15.0000 mL | Freq: Every day | ORAL | Status: DC
  Administered 2018-03-27: 15 mL via ORAL
  Filled 2018-03-26: qty 15

## 2018-03-26 MED ORDER — POTASSIUM CHLORIDE 20 MEQ/15ML (10%) PO SOLN
40.0000 meq | Freq: Two times a day (BID) | ORAL | Status: AC
  Administered 2018-03-26 (×2): 40 meq via ORAL
  Filled 2018-03-26 (×2): qty 30

## 2018-03-26 MED ORDER — SCOPOLAMINE 1 MG/3DAYS TD PT72
1.0000 | MEDICATED_PATCH | TRANSDERMAL | Status: DC
  Administered 2018-03-26 – 2018-04-04 (×4): 1.5 mg via TRANSDERMAL
  Filled 2018-03-26 (×4): qty 1

## 2018-03-26 MED ORDER — NICOTINE 21 MG/24HR TD PT24
21.0000 mg | MEDICATED_PATCH | Freq: Every day | TRANSDERMAL | Status: DC
  Administered 2018-03-27 – 2018-04-04 (×9): 21 mg via TRANSDERMAL
  Filled 2018-03-26 (×10): qty 1

## 2018-03-26 MED ORDER — MAGNESIUM SULFATE 2 GM/50ML IV SOLN
2.0000 g | Freq: Once | INTRAVENOUS | Status: AC
  Administered 2018-03-26: 2 g via INTRAVENOUS
  Filled 2018-03-26: qty 50

## 2018-03-26 NOTE — Progress Notes (Signed)
Note pt remains with gurgly breathing quality and frequent expectoration due to aspiration of secretions.  SLP will follow up - ongoing aspiration highly suspected however.   [name], [facility] SLP [phone]

## 2018-03-26 NOTE — Progress Notes (Signed)
Physical Therapy Treatment Patient Details Name: James [name] MRN: [id number] DOB: 1955/12/26 Today's Date: 03/26/2018    History of Present Illness James [name] is an 62 y.o. 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  He was brought in because of progressive weakness near syncope and confusion    PT Comments    Pt in bed with B mittens.  AxO x 1 following commands inconsistently.  Assisted to EOB.  General bed mobility comments: required increased assist.  Great difficulty coordinating mvts to complete task.  LOB all planes.  Unable to self sit EOB and unable to static sit still.  General transfer comment: severe posterior lean and constant ataxic mvts.  Unable to functionally use walker and unable to right self to midline.  HIGH FALL RISK.  Positioned in recliner with multiple pillows and applied chair alarm.   Follow Up Recommendations  SNF;Supervision/Assistance - 24 hour     Equipment Recommendations  None recommended by PT    Recommendations for Other Services       Precautions / Restrictions Precautions Precautions: Fall Restrictions Weight Bearing Restrictions: No    Mobility  Bed Mobility Overal bed mobility: Needs Assistance Bed Mobility: Supine to Sit   Sidelying to sit: Max assist Supine to sit: Max assist Sit to supine: Max assist   General bed mobility comments: required increased assist.  Great difficulty coordinating mvts to complete task.  LOB all planes.  Unable to self sit EOB and unable to static sit still.    Transfers Overall transfer level: Needs assistance Equipment used: Rolling walker (2 wheeled) Transfers: Sit to/from Stand Sit to Stand: +2 safety/equipment;+2 physical assistance;Total assist;Max assist         General transfer comment: severe posterior lean and constant  ataxic mvts.  Unable to functionally use walker and unable to right self to midline.    Ambulation/Gait Ambulation/Gait assistance: Total assist;+2 physical assistance;+2 safety/equipment Gait Distance (Feet): 3 Feet Assistive device: Rolling walker (2 wheeled) Gait Pattern/deviations: Ataxic;Scissoring;Leaning posteriorly;Staggering left;Staggering right;Narrow base of support     General Gait Details: Very unsteady gait with MAX ataxia and uncontrolled mvts.     Stairs             Wheelchair Mobility    Modified Rankin (Stroke Patients Only)       Balance                                            Cognition Arousal/Alertness: Awake/alert Behavior During Therapy: Flat affect Overall Cognitive Status: No family/caregiver present to determine baseline cognitive functioning Area of Impairment: Safety/judgement;Awareness                 Orientation Level: Disoriented to;Situation     Following Commands: Follows one step commands consistently Safety/Judgement: Decreased awareness of safety;Decreased awareness of deficits Awareness: Intellectual   General Comments: followed commands today, oriented to self and location then hearing voices in hallway he thought was talking to him      Exercises      General Comments        Pertinent Vitals/Pain Pain Assessment: No/denies pain    Home Living  Prior Function            PT Goals (current goals can now be found in the care plan section) Progress towards PT goals: Progressing toward goals    Frequency    Min 2X/week      PT Plan Current plan remains appropriate    Co-evaluation              AM-PAC PT "6 Clicks" Daily Activity  Outcome Measure  Difficulty turning over in bed (including adjusting bedclothes, sheets and blankets)?: A Lot Difficulty moving from lying on back to sitting on the side of the bed? : A Lot Difficulty sitting down  on and standing up from a chair with arms (e.g., wheelchair, bedside commode, etc,.)?: A Lot Help needed moving to and from a bed to chair (including a wheelchair)?: A Lot Help needed walking in hospital room?: A Lot Help needed climbing 3-5 steps with a railing? : Total 6 Click Score: 11    End of Session Equipment Utilized During Treatment: Gait belt Activity Tolerance: Other (comment) Patient left: in chair;with chair alarm set;with call bell/phone within reach Nurse Communication: Mobility status PT Visit Diagnosis: Difficulty in walking, not elsewhere classified (R26.2);Muscle weakness (generalized) (M62.81);Adult, failure to thrive (R62.7)     Time: 1583-0940 PT Time Calculation (min) (ACUTE ONLY): 18 min  Charges:  $Therapeutic Activity: 8-22 mins                    G Codes:       Rica Koyanagi  PTA [facility]  Acute  Rehab Pager      [phone]

## 2018-03-26 NOTE — Progress Notes (Signed)
PROGRESS NOTE    James [name]  MOQ:[id number] DOB: 07-04-1956 DOA: 03/14/2018 PCP: Denita Lung, MD     Brief Narrative:  James [name] is a 62 y.o.malepast medical history recurrent severe anemia requiring multiple transfusions due to AVMs with chronic blood loss, alcohol abuse who came in for weakness, near syncope and was found to have a hemoglobin of 2. He was noted to be confused in the ED with Temp of 91.2, BP 92/54, WBC 43.000. He was admitted for blood transfusions, started on IV antibiotic cefepime. Due to diarrhea, C Diff was checked which returned positive. He was started on PO vanco via NGT. Due to acute encephalopathy, MRI was completed which revealed bilateral subdural hematomas vs intracranial hypotension vs pachymeningitis. Neurology was consulted and they have recommended LP, which has not been completed due to patient's agitation.   New events last 24 hours / Subjective: No new issues.  Patient is oriented to self, hospital, year 2000.  He has no new complaints.  Per wife, patient has been much more interactive and verbal with her.  She states that he has been asking her when he can go home.    Assessment & Plan:   Principal Problem:   Symptomatic Anemia  Active Problems:   Tobacco abuse   Alcohol abuse   ILD (interstitial lung disease) (HCC)   Cachexia (HCC)   Severe protein-calorie malnutrition (HCC)   Iron deficiency anemia due to chronic blood loss   Symptomatic anemia   Lactic acidosis   Microcytic anemia   Thrombocytopenia (HCC)   Acute metabolic encephalopathy   Watery diarrhea   Symptomatic anemia  -Newly presented with hemoglobin of 2.  He has history of AVM.  FOBT have been negative x2.  He received total 6 units packed red blood cells.  Iron transfusion was also administered.   -Trend CBC, hemoglobin remained stable  Sepsis due to C diff colitis -Blood cultures negative  -Initially was given oral vancomycin through NG tube due to decreased  level of alertness. NG tube was removed by patient. Now patient taking oral Vanco  Acute toxic encephalopathy -Unclear etiology, initially thought to be delirium due to alcohol withdrawal.  He was managed with Ativan and CIWA scale.  MRI finally completed on 7/11 which revealed diffuse smooth dural thickening over the cerebral convexities. No significant mass effect. Findings probably represent thin subdural hematomas or intracranial hypotension, less likely pachymeningitis. -Neurology consulted -LP recommended, ordered on 7/12, unable to complete due to patient's lack of cooperation, agitation -Ammonia normal -Waxing and waning, but more alert than reported last week   -Spoke with Dr. Rory Percy on 7/18.  Patient has improved since last week when LP was recommended.  We discussed the patient's case, can hold off on LP for now.  Could consider CT head without contrast or repeat MRI to follow-up on his scan from 7/11.  If patient has clinical change, can call neurology back for assistance.  Tobacco abuse -Nicotine patch.  Increased dose per wife's request.  Alcohol abuse and withdrawal -Now out of window for withdrawal.  Patient was admitted on 7/7  Hepatomegaly with coagulopathy, thrombocytopenia  -?Secondary to alcohol abuse   Severe protein calorie malnutrition -Encourage oral intake  Dysphagia -Speech therapist recommending dysphagia 3 diet or full liquid diet due to continued risk of aspiration   Hypokalemia -Replace, trend   Hypomagnesemia -Replace, trend  Goals of care -Patient has been here >10 days, altered mentation, waxing/waning, dysphagia and aspiration risk, poor PO  intake, requiring IV ativan for agitation, without any big improvement. Discussed with wife that if he continues on this projectory, he may not have meaningful recovery for home or SNF. Also discussed feeding tube is an aggressive option and may not necessarily increase quality of life, he may attempt to pull  out tube, may still aspirate with tube feedings etc   DVT prophylaxis: SCDs Code Status: Full Family Communication: No family at bedside, spoke with wife over the phone 7/19  Disposition Plan: SNF when clinically improved. Palliative care consulted.    Consultants:   Neurology   Palliative care   Antimicrobials:  Anti-infectives (From admission, onward)   Start     Dose/Rate Route Frequency Ordered Stop   03/22/18 1600  metroNIDAZOLE (FLAGYL) tablet 500 mg  Status:  Discontinued     500 mg Oral Every 8 hours 03/22/18 1325 03/23/18 0809   03/21/18 0000  vancomycin (VANCOCIN) 50 mg/mL oral solution 125 mg     125 mg Oral Every 6 hours 03/20/18 2136 03/28/18 2359   03/21/18 0000  vancomycin (VANCOCIN) 500 mg in sodium chloride irrigation 0.9 % 100 mL ENEMA     500 mg Rectal Every 6 hours 03/20/18 2136 03/28/18 2359   03/19/18 2000  vancomycin (VANCOCIN) 500 mg in sodium chloride irrigation 0.9 % 100 mL ENEMA  Status:  Discontinued     500 mg Rectal Every 6 hours 03/19/18 1626 03/20/18 2136   03/19/18 1630  metroNIDAZOLE (FLAGYL) IVPB 500 mg  Status:  Discontinued     500 mg 100 mL/hr over 60 Minutes Intravenous Every 8 hours 03/19/18 1626 03/22/18 1325   03/17/18 1000  vancomycin (VANCOCIN) 50 mg/mL oral solution 125 mg  Status:  Discontinued     125 mg Oral 4 times daily 03/17/18 0739 03/20/18 2136   03/15/18 0800  ceFEPIme (MAXIPIME) 1 g in sodium chloride 0.9 % 100 mL IVPB  Status:  Discontinued     1 g 200 mL/hr over 30 Minutes Intravenous Every 8 hours 03/15/18 0603 03/17/18 0737   03/15/18 0130  ceFEPIme (MAXIPIME) 2 g in sodium chloride 0.9 % 100 mL IVPB     2 g 200 mL/hr over 30 Minutes Intravenous  Once 03/15/18 0117 03/15/18 0220       Objective: Vitals:   03/25/18 1632 03/25/18 2128 03/26/18 0543 03/26/18 0700  BP: 116/65 118/69 (!) 113/53   Pulse: 97 95 (!) 103   Resp: 15 17 18    Temp: 98.7 F (37.1 C) 98.7 F (37.1 C) 98.4 F (36.9 C)   TempSrc: Oral Oral  Oral   SpO2: 99% 97% 96%   Weight:    50.8 kg (112 lb)  Height:        Intake/Output Summary (Last 24 hours) at 03/26/2018 1252 Last data filed at 03/26/2018 0548 Gross per 24 hour  Intake 10 ml  Output 500 ml  Net -490 ml   Filed Weights   03/24/18 0226 03/25/18 0618 03/26/18 0700  Weight: 55.6 kg (122 lb 9.2 oz) 51.2 kg (112 lb 14 oz) 50.8 kg (112 lb)    Examination: General exam: Appears calm and comfortable  Respiratory system: Clear to auscultation. Respiratory effort normal. Cardiovascular system: S1 & S2 heard, RRR. No JVD, murmurs, rubs, gallops or clicks. No pedal edema. Gastrointestinal system: Abdomen is nondistended, soft and nontender. No organomegaly or masses felt. Normal bowel sounds heard. +Flexiseal in place  Central nervous system: Alert and oriented to self and place. No focal neurological deficits.  Extremities: Symmetric 5 x 5 power. Skin: No rashes, lesions or ulcers   Data Reviewed: I have personally reviewed following labs and imaging studies  CBC: Recent Labs  Lab 03/22/18 0519 03/23/18 0612 03/24/18 0617 03/25/18 0633 03/26/18 0710  WBC 14.1* 12.5* 10.2 11.7* 9.1  HGB 9.9* 9.5* 9.2* 9.3* 9.4*  HCT 29.8* 29.4* 28.2* 28.7* 29.4*  MCV 82.8 84.0 84.9 83.9 84.0  PLT 77* 80* 80* 114* 426*   Basic Metabolic Panel: Recent Labs  Lab 03/20/18 1205  03/21/18 0555 03/22/18 0519 03/23/18 0612 03/23/18 1842 03/24/18 0617 03/25/18 0633 03/26/18 0710  NA  --    < >  --  135 131* 134*  --  132* 135  K  --    < >  --  3.3* 3.8 3.7  --  2.9* 2.8*  CL  --    < >  --  110 109 112*  --  108 107  CO2  --    < >  --  18* 19* 16*  --  19* 21*  GLUCOSE  --    < >  --  103* 92 81  --  78 117*  BUN  --    < >  --  <5* <5* <5*  --  <5* <5*  CREATININE  --    < >  --  0.34* 0.32* 0.34*  --  0.37* <0.30*  CALCIUM  --    < >  --  7.2* 7.2* 6.7*  --  7.4* 7.7*  MG  --   --   --   --   --   --   --   --  1.3*  PHOS 0.7*  --  2.1* 1.5* 1.9*  --  2.1*  --   --     < > = values in this interval not displayed.   GFR: CrCl cannot be calculated (This lab value cannot be used to calculate CrCl because it is not a number: <0.30). Liver Function Tests: Recent Labs  Lab 03/21/18 0500 03/23/18 1842  AST 36 29  ALT 44 26  ALKPHOS 152* 121  BILITOT 3.3* 2.8*  PROT 5.4* 4.8*  ALBUMIN 2.2* 1.9*   No results for input(s): LIPASE, AMYLASE in the last 168 hours. Recent Labs  Lab 03/24/18 1908  AMMONIA 33   Coagulation Profile: Recent Labs  Lab 03/23/18 1842  INR 1.89   Cardiac Enzymes: No results for input(s): CKTOTAL, CKMB, CKMBINDEX, TROPONINI in the last 168 hours. BNP (last 3 results) No results for input(s): PROBNP in the last 8760 hours. HbA1C: No results for input(s): HGBA1C in the last 72 hours. CBG: Recent Labs  Lab 03/25/18 1634 03/25/18 2015 03/25/18 2350 03/26/18 0405 03/26/18 0757  GLUCAP 78 81 98 107* 111*   Lipid Profile: No results for input(s): CHOL, HDL, LDLCALC, TRIG, CHOLHDL, LDLDIRECT in the last 72 hours. Thyroid Function Tests: No results for input(s): TSH, T4TOTAL, FREET4, T3FREE, THYROIDAB in the last 72 hours. Anemia Panel: No results for input(s): VITAMINB12, FOLATE, FERRITIN, TIBC, IRON, RETICCTPCT in the last 72 hours. Sepsis Labs: No results for input(s): PROCALCITON, LATICACIDVEN in the last 168 hours.  No results found for this or any previous visit (from the past 240 hour(s)).     Radiology Studies: No results found.    Scheduled Meds: . feeding supplement (ENSURE ENLIVE)  237 mL Oral TID BM  . folic acid  1 mg Oral Daily  . [START ON 03/27/2018] multivitamin  15  mL Oral Daily  . [START ON 03/27/2018] nicotine  21 mg Transdermal Daily  . potassium chloride  40 mEq Oral BID  . scopolamine  1 patch Transdermal Q72H  . sodium chloride flush  10-40 mL Intracatheter Q12H  . thiamine  100 mg Oral Daily  . vancomycin  125 mg Oral Q6H   Or  . vancomycin (VANCOCIN) rectal ENEMA  500 mg Rectal Q6H     Continuous Infusions: . dextrose 5 % and 0.45% NaCl 75 mL/hr at 03/25/18 1026     LOS: 12 days    Time spent: 25 minutes   Dessa Phi, DO [facility] [email or web address] Password [facility] 03/26/2018, 12:52 PM

## 2018-03-27 DIAGNOSIS — Z515 Encounter for palliative care: Secondary | ICD-10-CM

## 2018-03-27 DIAGNOSIS — D500 Iron deficiency anemia secondary to blood loss (chronic): Secondary | ICD-10-CM

## 2018-03-27 DIAGNOSIS — Z7189 Other specified counseling: Secondary | ICD-10-CM

## 2018-03-27 DIAGNOSIS — R4182 Altered mental status, unspecified: Secondary | ICD-10-CM

## 2018-03-27 LAB — BASIC METABOLIC PANEL
ANION GAP: 5 (ref 5–15)
BUN: <5 mg/dL — ABNORMAL LOW (ref 6–20)
CALCIUM: 7.2 mg/dL — AB (ref 8.9–10.3)
CO2: 21 mmol/L — ABNORMAL LOW (ref 22–32)
Chloride: 108 mmol/L (ref 98–111)
Creatinine, Ser: 0.32 mg/dL — ABNORMAL LOW (ref 0.61–1.24)
Glucose, Bld: 221 mg/dL — ABNORMAL HIGH (ref 70–99)
Potassium: 2.7 mmol/L — CL (ref 3.5–5.1)
Sodium: 134 mmol/L — ABNORMAL LOW (ref 135–145)

## 2018-03-27 LAB — GLUCOSE, CAPILLARY
Glucose-Capillary: 70 mg/dL (ref 70–99)
Glucose-Capillary: 79 mg/dL (ref 70–99)
Glucose-Capillary: 85 mg/dL (ref 70–99)

## 2018-03-27 LAB — CBC
HCT: 26.4 % — ABNORMAL LOW (ref 39.0–52.0)
Hemoglobin: 8.4 g/dL — ABNORMAL LOW (ref 13.0–17.0)
MCH: 27.1 pg (ref 26.0–34.0)
MCHC: 31.8 g/dL (ref 30.0–36.0)
MCV: 85.2 fL (ref 78.0–100.0)
PLATELETS: 124 10*3/uL — AB (ref 150–400)
RBC: 3.1 MIL/uL — ABNORMAL LOW (ref 4.22–5.81)
RDW: 26.2 % — AB (ref 11.5–15.5)
WBC: 9.3 10*3/uL (ref 4.0–10.5)

## 2018-03-27 LAB — MAGNESIUM: Magnesium: 1.5 mg/dL — ABNORMAL LOW (ref 1.7–2.4)

## 2018-03-27 MED ORDER — FOLIC ACID 5 MG/ML IJ SOLN
1.0000 mg | Freq: Every day | INTRAMUSCULAR | Status: DC
  Administered 2018-03-27 – 2018-04-04 (×9): 1 mg via INTRAVENOUS
  Filled 2018-03-27 (×10): qty 0.2

## 2018-03-27 MED ORDER — POTASSIUM CHLORIDE 20 MEQ/15ML (10%) PO SOLN: 40.0000 meq | Freq: Once | ORAL | Status: DC

## 2018-03-27 MED ORDER — POTASSIUM CHLORIDE 20 MEQ/15ML (10%) PO SOLN
40.0000 meq | Freq: Two times a day (BID) | ORAL | Status: DC
  Administered 2018-03-27: 40 meq via ORAL
  Filled 2018-03-27: qty 30

## 2018-03-27 MED ORDER — POTASSIUM CHLORIDE 10 MEQ/100ML IV SOLN: 10.0000 meq | INTRAVENOUS | Status: DC

## 2018-03-27 MED ORDER — THIAMINE HCL 100 MG/ML IJ SOLN
100.0000 mg | Freq: Every day | INTRAMUSCULAR | Status: DC
  Administered 2018-03-28 – 2018-04-04 (×8): 100 mg via INTRAVENOUS
  Filled 2018-03-27 (×8): qty 2

## 2018-03-27 MED ORDER — MAGNESIUM SULFATE 2 GM/50ML IV SOLN
2.0000 g | Freq: Once | INTRAVENOUS | Status: AC
  Administered 2018-03-27: 2 g via INTRAVENOUS
  Filled 2018-03-27: qty 50

## 2018-03-27 MED ORDER — POTASSIUM CHLORIDE 10 MEQ/100ML IV SOLN
10.0000 meq | INTRAVENOUS | Status: AC
  Administered 2018-03-27 (×4): 10 meq via INTRAVENOUS
  Filled 2018-03-27: qty 100

## 2018-03-27 NOTE — Consult Note (Signed)
Consultation Note Date: 03/27/2018   Patient Name: James [name]  DOB: 1956/02/26  MRN: [id number]  Age / Sex: 62 y.o., male  PCP: Denita Lung, MD Referring Physician: Dessa Phi, DO  Reason for Consultation: Establishing goals of care  HPI/Patient Profile: 62 y.o. male  with past medical history of recurrent severe anemia (hx AVM) alcohol abuse admitted on 03/14/2018 with weakness and low Hgb and found to have sepsis from c-diff colitis.  Continues to have acute encephalopathy.  Concern for aspiration as well.  Palliative consulted for goals of care.     Clinical Assessment and Goals of Care: I met today with patient's wife, [name].  He remains too confused to participate in conversation.   We discussed clinical course as well as wishes moving forward in regard to advanced directives and care this hospitalization.  We had initial discussion about difference between a aggressive medical intervention path and a palliative, comfort focused care path.   Concept of Hospice and Palliative Care were discussed  SUMMARY OF RECOMMENDATIONS   - His wife reports concern that he may have irreversible decline, however, she feels that he has been having some improvement in mental status the past couple of days.  We discussed plan to continue current therapies and reassess in 48 hours.  We also had initial discussion regarding possibility of hospice, but she reports needing to see how he does over the next couple of days prior to considering this.  Code Status/Advance Care Planning:  Full code  Palliative Prophylaxis:   Delirium Protocol  Additional Recommendations (Limitations, Scope, Preferences):  Full Scope Treatment  Psycho-social/Spiritual:   Desire for further Chaplaincy support:no  Additional Recommendations: Education on Hospice  Prognosis:   Unable to determine  Discharge Planning: To Be  Determined      Primary Diagnoses: Present on Admission: . Symptomatic Anemia  . Tobacco abuse . Alcohol abuse . ILD (interstitial lung disease) ([place]) . Cachexia ([place]) . Iron deficiency anemia due to chronic blood loss . Lactic acidosis . Symptomatic anemia . Severe protein-calorie malnutrition ([place])   I have reviewed the medical record, interviewed the patient and family, and examined the patient. The following aspects are pertinent.  Past Medical History:  Diagnosis Date  . Alcohol abuse   . Anemia due to GI blood loss 09/2011   microcytic.  transfused for Hgb 3.5, MCV in 50s.   . Aortic insufficiency   . Benign neoplasm of colon   . Black stools 08/21/2015  . Cachexia ([place])   . Chest pain 08/21/2015  . Dysphagia   . ED (erectile dysfunction)   . Edema   . GERD (gastroesophageal reflux disease)   . ILD (interstitial lung disease) ([place])   . Iron deficiency anemia, unspecified   . Pulmonary nodule   . Reflux esophagitis   . Smoker   . Tobacco abuse   . Weight loss, abnormal    Social History   Socioeconomic History  . Marital status: Married    Spouse name: Not on file  .  Number of children: Not on file  . Years of education: Not on file  . Highest education level: Not on file  Occupational History  . Occupation: unemployed    Fish farm manager: UnEmployed  Social Needs  . Financial resource strain: Not on file  . Food insecurity:    Worry: Not on file    Inability: Not on file  . Transportation needs:    Medical: Not on file    Non-medical: Not on file  Tobacco Use  . Smoking status: Former Smoker    Packs/day: 1.00    Years: 20.00    Pack years: 20.00    Types: Cigarettes  . Smokeless tobacco: Never Used  . Tobacco comment: pt states he is down to 7 cigs per day  Substance and Sexual Activity  . Alcohol use: Yes    Alcohol/week: 2.4 oz    Types: 4 Cans of beer per week  . Drug use: Yes    Types: Marijuana  . Sexual activity: Not Currently  Lifestyle    . Physical activity:    Days per week: Not on file    Minutes per session: Not on file  . Stress: Not on file  Relationships  . Social connections:    Talks on phone: Not on file    Gets together: Not on file    Attends religious service: Not on file    Active member of club or organization: Not on file    Attends meetings of clubs or organizations: Not on file    Relationship status: Not on file  Other Topics Concern  . Not on file  Social History Narrative  . Not on file   Family History  Problem Relation Age of Onset  . Hypertension Mother   . Diabetes Maternal Grandfather    Scheduled Meds: . feeding supplement (ENSURE ENLIVE)  237 mL Oral TID BM  . folic acid  1 mg Intravenous Daily  . nicotine  21 mg Transdermal Daily  . scopolamine  1 patch Transdermal Q72H  . sodium chloride flush  10-40 mL Intracatheter Q12H  . thiamine injection  100 mg Intravenous Daily  . vancomycin  125 mg Oral Q6H   Or  . vancomycin (VANCOCIN) rectal ENEMA  500 mg Rectal Q6H   Continuous Infusions: . dextrose 5 % and 0.45% NaCl 75 mL/hr at 03/26/18 2230   PRN Meds:.LORazepam, [DISCONTINUED] ondansetron **OR** ondansetron (ZOFRAN) IV, sodium chloride flush Medications Prior to Admission:  Prior to Admission medications   Medication Sig Start Date End Date Taking? Authorizing Provider  azithromycin (ZITHROMAX) 250 MG tablet Take 1 tablet (250 mg total) by mouth daily. Patient not taking: Reported on 03/14/2018 09/14/17   Edwin Dada, MD  cefpodoxime (VANTIN) 200 MG tablet Take 1 tablet (200 mg total) by mouth 2 (two) times daily. Patient not taking: Reported on 03/14/2018 09/14/17   Edwin Dada, MD  docusate sodium (COLACE) 100 MG capsule Take 1 capsule (100 mg total) by mouth 2 (two) times daily. Patient not taking: Reported on 03/14/2018 09/14/17   Edwin Dada, MD  ferrous sulfate 325 (65 FE) MG tablet Take 1 tablet (325 mg total) by mouth 3 (three) times daily with  meals. Patient not taking: Reported on 03/14/2018 09/14/17   Edwin Dada, MD  folic acid (FOLVITE) 1 MG tablet Take 1 tablet (1 mg total) by mouth daily. Patient not taking: Reported on 09/09/2017 09/22/16   Jonetta Osgood, MD  nicotine (NICODERM CQ - DOSED  IN MG/24 HOURS) 21 mg/24hr patch Place 1 patch (21 mg total) onto the skin daily. Patient not taking: Reported on 09/09/2017 03/04/17   Jani Gravel, MD  pantoprazole (PROTONIX) 40 MG tablet Take 1 tablet (40 mg total) by mouth daily at 6 (six) AM. Patient not taking: Reported on 03/14/2018 09/22/16   Jonetta Osgood, MD  thiamine 100 MG tablet Take 1 tablet (100 mg total) by mouth daily. Patient not taking: Reported on 09/09/2017 09/22/16   Jonetta Osgood, MD   No Known Allergies Review of Systems Unable to obtain  Physical Exam General: Confused, frail, cachectic, sleepy  HEENT: No bruits, no goiter, no JVD Heart: Regular rate and rhythm. No murmur appreciated. Lungs: Good air movement, clear Abdomen: Soft, nontender, nondistended, positive bowel sounds.  Ext: No significant edema Skin: Warm and dry  Vital Signs: BP 97/72 (BP Location: Left Arm)   Pulse (!) 102   Temp 97.9 F (36.6 C) (Oral)   Resp 18   Ht _0  (1.778 m)   Wt 51.1 kg (112 lb 10.5 oz)   SpO2 97%   BMI 16.16 kg/m  Pain Scale: 0-10   Pain Score: 0-No pain   SpO2: SpO2: 97 % O2 Device:SpO2: 97 % O2 Flow Rate: .O2 Flow Rate (L/min): 2 L/min  IO: Intake/output summary:   Intake/Output Summary (Last 24 hours) at 03/27/2018 2206 Last data filed at 03/27/2018 1155 Gross per 24 hour  Intake 870 ml  Output 600 ml  Net 270 ml    LBM: Last BM Date: 03/27/18 Baseline Weight: Weight: 45.9 kg (101 lb 3.1 oz) Most recent weight: Weight: 51.1 kg (112 lb 10.5 oz)     Palliative Assessment/Data:   Flowsheet Rows     Most Recent Value  Intake Tab  Referral Department  Hospitalist  Clinical Assessment  Psychosocial & Spiritual Assessment    Palliative Care Outcomes     Time Total: 80 minutes Greater than 50%  of this time was spent counseling and coordinating care related to the above assessment and plan.  Signed by: Micheline Rough, MD   Please contact Palliative Medicine Team phone at [phone] for questions and concerns.  For individual provider: See Shea Evans

## 2018-03-27 NOTE — Progress Notes (Signed)
1400 Flex a seal replaced

## 2018-03-27 NOTE — Progress Notes (Signed)
PROGRESS NOTE    James [name]  NAT:[id number] DOB: 01/14/1956 DOA: 03/14/2018 PCP: Denita Lung, MD     Brief Narrative:  James [name] is a 62 y.o.malepast medical history recurrent severe anemia requiring multiple transfusions due to AVMs with chronic blood loss, alcohol abuse who came in for weakness, near syncope and was found to have a hemoglobin of 2. He was noted to be confused in the ED with Temp of 91.2, BP 92/54, WBC 43.000. He was admitted for blood transfusions, started on IV antibiotic cefepime. Due to diarrhea, C Diff was checked which returned positive. He was started on PO vanco via NGT. Due to acute encephalopathy, MRI was completed which revealed bilateral subdural hematomas vs intracranial hypotension vs pachymeningitis. Neurology was consulted and they have recommended LP, which has not been completed due to patient's agitation.   New events last 24 hours / Subjective: Brother at bedside.  Patient received Ativan last night, sleeping soundly this morning.  Assessment & Plan:   Principal Problem:   Symptomatic Anemia  Active Problems:   Tobacco abuse   Alcohol abuse   ILD (interstitial lung disease) (HCC)   Cachexia (HCC)   Severe protein-calorie malnutrition (HCC)   Iron deficiency anemia due to chronic blood loss   Symptomatic anemia   Lactic acidosis   Microcytic anemia   Thrombocytopenia (HCC)   Acute metabolic encephalopathy   Watery diarrhea   Symptomatic anemia  -Newly presented with hemoglobin of 2.  He has history of AVM.  FOBT have been negative x2.  He received total 6 units packed red blood cells.  Iron transfusion was also administered.   -Trend CBC  Sepsis due to C diff colitis -Blood cultures negative  -Initially was given oral vancomycin through NG tube due to decreased level of alertness. NG tube was removed by patient. Now patient taking oral Vanco  Acute toxic encephalopathy -Unclear etiology, initially thought to be delirium  due to alcohol withdrawal.  He was managed with Ativan and CIWA scale.  MRI finally completed on 7/11 which revealed diffuse smooth dural thickening over the cerebral convexities. No significant mass effect. Findings probably represent thin subdural hematomas or intracranial hypotension, less likely pachymeningitis. -Neurology consulted -LP recommended, ordered on 7/12, unable to complete due to patient's lack of cooperation, agitation -Ammonia normal -Waxing and waning, but more alert than reported last week   -Spoke with Dr. Rory Percy on 7/18.  Patient has improved since last week when LP was recommended.  We discussed the patient's case, can hold off on LP for now.  Could consider CT head without contrast or repeat MRI to follow-up on his scan from 7/11.  If patient has clinical change, can call neurology back for assistance.  Tobacco abuse -Nicotine patch.  Increased dose per wife's request.  Alcohol abuse and withdrawal -Now out of window for withdrawal.  Patient was admitted on 7/7  Hepatomegaly with coagulopathy, thrombocytopenia  -?Secondary to alcohol abuse   Severe protein calorie malnutrition -Encourage oral intake  Dysphagia -Speech therapist recommending dysphagia 3 diet or full liquid diet due to continued risk of aspiration   Hypokalemia -Replace, trend   Hypomagnesemia -Replace, trend  Goals of care -Patient has been here >10 days, altered mentation, waxing/waning, dysphagia and aspiration risk, poor PO intake, requiring IV ativan for agitation, without any big improvement. Discussed with wife that if he continues on this projectory, he may not have meaningful recovery for home or SNF. Also discussed feeding tube is an aggressive  option and may not necessarily increase quality of life, he may attempt to pull out tube, may still aspirate with tube feedings etc   DVT prophylaxis: SCDs Code Status: Full Family Communication: Brother at bedside  Disposition Plan: SNF  when clinically improved. Palliative care consulted.  Difficult situation as patient has waxing and waning mentation, dysphagia and aspiration risk, poor oral intake.   Consultants:   Neurology   Palliative care   Antimicrobials:  Anti-infectives (From admission, onward)   Start     Dose/Rate Route Frequency Ordered Stop   03/22/18 1600  metroNIDAZOLE (FLAGYL) tablet 500 mg  Status:  Discontinued     500 mg Oral Every 8 hours 03/22/18 1325 03/23/18 0809   03/21/18 0000  vancomycin (VANCOCIN) 50 mg/mL oral solution 125 mg     125 mg Oral Every 6 hours 03/20/18 2136 03/28/18 2359   03/21/18 0000  vancomycin (VANCOCIN) 500 mg in sodium chloride irrigation 0.9 % 100 mL ENEMA     500 mg Rectal Every 6 hours 03/20/18 2136 03/28/18 2359   03/19/18 2000  vancomycin (VANCOCIN) 500 mg in sodium chloride irrigation 0.9 % 100 mL ENEMA  Status:  Discontinued     500 mg Rectal Every 6 hours 03/19/18 1626 03/20/18 2136   03/19/18 1630  metroNIDAZOLE (FLAGYL) IVPB 500 mg  Status:  Discontinued     500 mg 100 mL/hr over 60 Minutes Intravenous Every 8 hours 03/19/18 1626 03/22/18 1325   03/17/18 1000  vancomycin (VANCOCIN) 50 mg/mL oral solution 125 mg  Status:  Discontinued     125 mg Oral 4 times daily 03/17/18 0739 03/20/18 2136   03/15/18 0800  ceFEPIme (MAXIPIME) 1 g in sodium chloride 0.9 % 100 mL IVPB  Status:  Discontinued     1 g 200 mL/hr over 30 Minutes Intravenous Every 8 hours 03/15/18 0603 03/17/18 0737   03/15/18 0130  ceFEPIme (MAXIPIME) 2 g in sodium chloride 0.9 % 100 mL IVPB     2 g 200 mL/hr over 30 Minutes Intravenous  Once 03/15/18 0117 03/15/18 0220       Objective: Vitals:   03/26/18 0700 03/26/18 1322 03/26/18 2134 03/27/18 0607  BP:  103/71 110/82 108/74  Pulse:  (!) 124 100 97  Resp:  16 18 20   Temp:  97.6 F (36.4 C) 97.9 F (36.6 C) 98 F (36.7 C)  TempSrc:  Oral Oral Oral  SpO2:  99% 98% 99%  Weight: 50.8 kg (112 lb)   51.1 kg (112 lb 10.5 oz)  Height:         Intake/Output Summary (Last 24 hours) at 03/27/2018 1134 Last data filed at 03/27/2018 0336 Gross per 24 hour  Intake 3495 ml  Output -  Net 3495 ml   Filed Weights   03/25/18 0618 03/26/18 0700 03/27/18 0607  Weight: 51.2 kg (112 lb 14 oz) 50.8 kg (112 lb) 51.1 kg (112 lb 10.5 oz)    Examination: General exam: Appears calm and comfortable, sleeping  Respiratory system: Clear to auscultation. Respiratory effort normal. +upper respiratory secretions  Cardiovascular system: S1 & S2 heard, RRR. No JVD, murmurs, rubs, gallops or clicks. No pedal edema. Gastrointestinal system: Abdomen is nondistended, soft and nontender. No organomegaly or masses felt. Normal bowel sounds heard. Extremities: Symmetric 5 x 5 power. Skin: No rashes, lesions or ulcers    Data Reviewed: I have personally reviewed following labs and imaging studies  CBC: Recent Labs  Lab 03/23/18 0612 03/24/18 0617 03/25/18 4782 03/26/18  0710 03/27/18 0500  WBC 12.5* 10.2 11.7* 9.1 9.3  HGB 9.5* 9.2* 9.3* 9.4* 8.4*  HCT 29.4* 28.2* 28.7* 29.4* 26.4*  MCV 84.0 84.9 83.9 84.0 85.2  PLT 80* 80* 114* 136* 638*   Basic Metabolic Panel: Recent Labs  Lab 03/20/18 1205  03/21/18 0555 03/22/18 0519 03/23/18 0612 03/23/18 1842 03/24/18 0617 03/25/18 0633 03/26/18 0710 03/27/18 0500  NA  --    < >  --  135 131* 134*  --  132* 135 134*  K  --    < >  --  3.3* 3.8 3.7  --  2.9* 2.8* 2.7*  CL  --    < >  --  110 109 112*  --  108 107 108  CO2  --    < >  --  18* 19* 16*  --  19* 21* 21*  GLUCOSE  --    < >  --  103* 92 81  --  78 117* 221*  BUN  --    < >  --  <5* <5* <5*  --  <5* <5* <5*  CREATININE  --    < >  --  0.34* 0.32* 0.34*  --  0.37* <0.30* 0.32*  CALCIUM  --    < >  --  7.2* 7.2* 6.7*  --  7.4* 7.7* 7.2*  MG  --   --   --   --   --   --   --   --  1.3* 1.5*  PHOS 0.7*  --  2.1* 1.5* 1.9*  --  2.1*  --   --   --    < > = values in this interval not displayed.   GFR: Estimated Creatinine  Clearance: 71 mL/min (A) (by C-G formula based on SCr of 0.32 mg/dL (L)). Liver Function Tests: Recent Labs  Lab 03/21/18 0500 03/23/18 1842  AST 36 29  ALT 44 26  ALKPHOS 152* 121  BILITOT 3.3* 2.8*  PROT 5.4* 4.8*  ALBUMIN 2.2* 1.9*   No results for input(s): LIPASE, AMYLASE in the last 168 hours. Recent Labs  Lab 03/24/18 1908  AMMONIA 33   Coagulation Profile: Recent Labs  Lab 03/23/18 1842  INR 1.89   Cardiac Enzymes: No results for input(s): CKTOTAL, CKMB, CKMBINDEX, TROPONINI in the last 168 hours. BNP (last 3 results) No results for input(s): PROBNP in the last 8760 hours. HbA1C: No results for input(s): HGBA1C in the last 72 hours. CBG: Recent Labs  Lab 03/26/18 1842 03/26/18 2110 03/27/18 0013 03/27/18 0509 03/27/18 0803  GLUCAP 73 74 70 79 85   Lipid Profile: No results for input(s): CHOL, HDL, LDLCALC, TRIG, CHOLHDL, LDLDIRECT in the last 72 hours. Thyroid Function Tests: No results for input(s): TSH, T4TOTAL, FREET4, T3FREE, THYROIDAB in the last 72 hours. Anemia Panel: No results for input(s): VITAMINB12, FOLATE, FERRITIN, TIBC, IRON, RETICCTPCT in the last 72 hours. Sepsis Labs: No results for input(s): PROCALCITON, LATICACIDVEN in the last 168 hours.  No results found for this or any previous visit (from the past 240 hour(s)).     Radiology Studies: No results found.    Scheduled Meds: . feeding supplement (ENSURE ENLIVE)  237 mL Oral TID BM  . folic acid  1 mg Oral Daily  . multivitamin  15 mL Oral Daily  . nicotine  21 mg Transdermal Daily  . potassium chloride  40 mEq Oral BID  . scopolamine  1 patch Transdermal Q72H  . sodium  chloride flush  10-40 mL Intracatheter Q12H  . thiamine  100 mg Oral Daily  . vancomycin  125 mg Oral Q6H   Or  . vancomycin (VANCOCIN) rectal ENEMA  500 mg Rectal Q6H   Continuous Infusions: . dextrose 5 % and 0.45% NaCl 75 mL/hr at 03/26/18 2230  . magnesium sulfate 1 - 4 g bolus IVPB       LOS:  13 days    Time spent: 15 minutes   Dessa Phi, DO [facility] [email or web address] Password [facility] 03/27/2018, 11:34 AM

## 2018-03-27 NOTE — Progress Notes (Signed)
Critical Value Alert  Critical Value: Potassium 2.7  Date & Time Notified: 03/27/18  0620  Provider Notified: Silas [place]  Orders Received: Provider to address

## 2018-03-28 LAB — CBC
HEMATOCRIT: 25.2 % — AB (ref 39.0–52.0)
HEMOGLOBIN: 8.1 g/dL — AB (ref 13.0–17.0)
MCH: 27.3 pg (ref 26.0–34.0)
MCHC: 32.1 g/dL (ref 30.0–36.0)
MCV: 84.8 fL (ref 78.0–100.0)
Platelets: 132 10*3/uL — ABNORMAL LOW (ref 150–400)
RBC: 2.97 MIL/uL — ABNORMAL LOW (ref 4.22–5.81)
RDW: 25.4 % — AB (ref 11.5–15.5)
WBC: 8.5 10*3/uL (ref 4.0–10.5)

## 2018-03-28 LAB — MAGNESIUM: MAGNESIUM: 1.6 mg/dL — AB (ref 1.7–2.4)

## 2018-03-28 LAB — BASIC METABOLIC PANEL
ANION GAP: 4 — AB (ref 5–15)
BUN: <5 mg/dL — ABNORMAL LOW (ref 6–20)
CALCIUM: 7.2 mg/dL — AB (ref 8.9–10.3)
CO2: 21 mmol/L — AB (ref 22–32)
Chloride: 109 mmol/L (ref 98–111)
Creatinine, Ser: <0.3 mg/dL — ABNORMAL LOW (ref 0.61–1.24)
Glucose, Bld: 208 mg/dL — ABNORMAL HIGH (ref 70–99)
Potassium: 3 mmol/L — ABNORMAL LOW (ref 3.5–5.1)
Sodium: 134 mmol/L — ABNORMAL LOW (ref 135–145)

## 2018-03-28 LAB — GLUCOSE, CAPILLARY: Glucose-Capillary: 68 mg/dL — ABNORMAL LOW (ref 70–99)

## 2018-03-28 MED ORDER — FLUTICASONE PROPIONATE 50 MCG/ACT NA SUSP
2.0000 | NASAL | Status: DC | PRN
  Administered 2018-03-28: 2 via NASAL
  Filled 2018-03-28 (×2): qty 16

## 2018-03-28 MED ORDER — MAGNESIUM SULFATE 2 GM/50ML IV SOLN
2.0000 g | Freq: Once | INTRAVENOUS | Status: AC
  Administered 2018-03-28: 2 g via INTRAVENOUS
  Filled 2018-03-28: qty 50

## 2018-03-28 MED ORDER — ALBUTEROL SULFATE (2.5 MG/3ML) 0.083% IN NEBU: 2.5000 mg | INHALATION_SOLUTION | RESPIRATORY_TRACT | Status: DC | PRN

## 2018-03-28 MED ORDER — POTASSIUM CHLORIDE 10 MEQ/100ML IV SOLN
10.0000 meq | INTRAVENOUS | Status: AC
  Administered 2018-03-28 (×4): 10 meq via INTRAVENOUS
  Filled 2018-03-28 (×2): qty 100

## 2018-03-28 MED ORDER — ALBUTEROL SULFATE (2.5 MG/3ML) 0.083% IN NEBU
INHALATION_SOLUTION | RESPIRATORY_TRACT | Status: AC
  Filled 2018-03-28: qty 3

## 2018-03-28 NOTE — Progress Notes (Signed)
PROGRESS NOTE    James [name]  LZJ:[id number] DOB: 1955/11/16 DOA: 03/14/2018 PCP: Denita Lung, MD     Brief Narrative:  James [name] is a 62 y.o.malepast medical history recurrent severe anemia requiring multiple transfusions due to AVMs with chronic blood loss, alcohol abuse who came in for weakness, near syncope and was found to have a hemoglobin of 2. He was noted to be confused in the ED with Temp of 91.2, BP 92/54, WBC 43.000. He was admitted for blood transfusions, started on IV antibiotic cefepime. Due to diarrhea, C Diff was checked which returned positive. He was started on PO vanco via NGT. Due to acute encephalopathy, MRI was completed which revealed bilateral subdural hematomas vs intracranial hypotension vs pachymeningitis. Neurology was consulted and they have recommended LP, which has not been completed due to patient's agitation.   New events last 24 hours / Subjective: No new issues overnight. Rectal tube replaced and patient received vanco enema yesterday   Assessment & Plan:   Principal Problem:   Symptomatic Anemia  Active Problems:   Tobacco abuse   Alcohol abuse   ILD (interstitial lung disease) (HCC)   Cachexia (HCC)   Severe protein-calorie malnutrition (HCC)   Iron deficiency anemia due to chronic blood loss   Symptomatic anemia   Lactic acidosis   Microcytic anemia   Thrombocytopenia (HCC)   Acute metabolic encephalopathy   Watery diarrhea   Symptomatic anemia  -Newly presented with hemoglobin of 2.  He has history of AVM.  FOBT have been negative x2.  He received total 6 units packed red blood cells.  Iron transfusion was also administered -Trend CBC  Sepsis due to C diff colitis -Blood cultures negative  -Initially was given oral vancomycin through NG tube due to decreased level of alertness. NG tube was removed by patient. Now patient taking oral Vanco / rectal Vanco   Acute toxic encephalopathy -Unclear etiology, initially thought to  be delirium due to alcohol withdrawal.  He was managed with Ativan and CIWA scale.  MRI finally completed on 7/11 which revealed diffuse smooth dural thickening over the cerebral convexities. No significant mass effect. Findings probably represent thin subdural hematomas or intracranial hypotension, less likely pachymeningitis. -Neurology consulted -LP recommended, ordered on 7/12, unable to complete due to patient's lack of cooperation, agitation -Ammonia normal -Spoke with Dr. Rory Percy on 7/18.  Patient has improved since last week when LP was recommended.  We discussed the patient's case, can hold off on LP for now.  Could consider CT head without contrast or repeat MRI to follow-up on his scan from 7/11.  If patient has clinical change, can call neurology back for assistance. -Waxing and waning, but more alert than reported last week. Remains confused on my examination but per family, he is more interactive with them    Tobacco abuse -Nicotine patch.  Increased dose per wife's request.  Alcohol abuse and withdrawal -Now out of window for withdrawal.  Patient was admitted on 7/7  Hepatomegaly with coagulopathy, thrombocytopenia  -?Secondary to alcohol abuse   Severe protein calorie malnutrition -Encourage oral intake  Dysphagia -Speech therapist recommending dysphagia 3 diet or full liquid diet due to continued risk of aspiration   Hypokalemia -Replace, trend   Hypomagnesemia -Replace, trend  Goals of care -Patient has been here >10 days, altered mentation, waxing/waning, dysphagia and aspiration risk, poor PO intake, requiring IV ativan for agitation, without any big improvement. Discussed with wife that if he continues on this projectory,  he may not have meaningful recovery for home or SNF. Also discussed feeding tube is an aggressive option and may not necessarily increase quality of life, he may attempt to pull out tube, may still aspirate with tube feedings etc -Wife spoke with  palliative care team 7/20. We discussed their conversation which went well. She is considering discharge to SNF with palliative care team to follow. She does not feel he is ready for hospice just yet, although she will consider it.    DVT prophylaxis: SCDs Code Status: Full Family Communication: spoke with wife over the phone  Disposition Plan: SNF with palliative care team, next 24-48 hours    Consultants:   Neurology   Palliative care   Antimicrobials:  Anti-infectives (From admission, onward)   Start     Dose/Rate Route Frequency Ordered Stop   03/22/18 1600  metroNIDAZOLE (FLAGYL) tablet 500 mg  Status:  Discontinued     500 mg Oral Every 8 hours 03/22/18 1325 03/23/18 0809   03/21/18 0000  vancomycin (VANCOCIN) 50 mg/mL oral solution 125 mg     125 mg Oral Every 6 hours 03/20/18 2136 03/28/18 2359   03/21/18 0000  vancomycin (VANCOCIN) 500 mg in sodium chloride irrigation 0.9 % 100 mL ENEMA     500 mg Rectal Every 6 hours 03/20/18 2136 03/28/18 2359   03/19/18 2000  vancomycin (VANCOCIN) 500 mg in sodium chloride irrigation 0.9 % 100 mL ENEMA  Status:  Discontinued     500 mg Rectal Every 6 hours 03/19/18 1626 03/20/18 2136   03/19/18 1630  metroNIDAZOLE (FLAGYL) IVPB 500 mg  Status:  Discontinued     500 mg 100 mL/hr over 60 Minutes Intravenous Every 8 hours 03/19/18 1626 03/22/18 1325   03/17/18 1000  vancomycin (VANCOCIN) 50 mg/mL oral solution 125 mg  Status:  Discontinued     125 mg Oral 4 times daily 03/17/18 0739 03/20/18 2136   03/15/18 0800  ceFEPIme (MAXIPIME) 1 g in sodium chloride 0.9 % 100 mL IVPB  Status:  Discontinued     1 g 200 mL/hr over 30 Minutes Intravenous Every 8 hours 03/15/18 0603 03/17/18 0737   03/15/18 0130  ceFEPIme (MAXIPIME) 2 g in sodium chloride 0.9 % 100 mL IVPB     2 g 200 mL/hr over 30 Minutes Intravenous  Once 03/15/18 0117 03/15/18 0220       Objective: Vitals:   03/27/18 0607 03/27/18 1421 03/27/18 1957 03/28/18 0400  BP: 108/74  112/78 97/72 100/75  Pulse: 97 93 (!) 102 99  Resp: 20 16 18 14   Temp: 98 F (36.7 C) 98.1 F (36.7 C) 97.9 F (36.6 C) 98 F (36.7 C)  TempSrc: Oral Oral Oral Oral  SpO2: 99% 93% 97% 98%  Weight: 51.1 kg (112 lb 10.5 oz)   52 kg (114 lb 10.2 oz)  Height:        Intake/Output Summary (Last 24 hours) at 03/28/2018 1047 Last data filed at 03/28/2018 0400 Gross per 24 hour  Intake 1781.25 ml  Output 800 ml  Net 981.25 ml   Filed Weights   03/26/18 0700 03/27/18 0607 03/28/18 0400  Weight: 50.8 kg (112 lb) 51.1 kg (112 lb 10.5 oz) 52 kg (114 lb 10.2 oz)    Examination: General exam: Appears calm and comfortable, sleeping, alert to voice   Respiratory system: Clear to auscultation. Respiratory effort normal. +upper respiratory secretions  Cardiovascular system: S1 & S2 heard, RRR. No JVD, murmurs, rubs, gallops or clicks. No  pedal edema. Gastrointestinal system: Abdomen is nondistended, soft and nontender. No organomegaly or masses felt. Normal bowel sounds heard. +rectal tube in place with liquid stool  Central nervous system: Alert to voice, does not follow commands, hands in mittens  Extremities: Symmetric  Skin: No rashes, lesions or ulcers   Data Reviewed: I have personally reviewed following labs and imaging studies  CBC: Recent Labs  Lab 03/24/18 0617 03/25/18 0633 03/26/18 0710 03/27/18 0500 03/28/18 0500  WBC 10.2 11.7* 9.1 9.3 8.5  HGB 9.2* 9.3* 9.4* 8.4* 8.1*  HCT 28.2* 28.7* 29.4* 26.4* 25.2*  MCV 84.9 83.9 84.0 85.2 84.8  PLT 80* 114* 136* 124* 093*   Basic Metabolic Panel: Recent Labs  Lab 03/22/18 0519 03/23/18 0612 03/23/18 1842 03/24/18 0617 03/25/18 0633 03/26/18 0710 03/27/18 0500 03/28/18 0500  NA 135 131* 134*  --  132* 135 134* 134*  K 3.3* 3.8 3.7  --  2.9* 2.8* 2.7* 3.0*  CL 110 109 112*  --  108 107 108 109  CO2 18* 19* 16*  --  19* 21* 21* 21*  GLUCOSE 103* 92 81  --  78 117* 221* 208*  BUN <5* <5* <5*  --  <5* <5* <5* <5*    CREATININE 0.34* 0.32* 0.34*  --  0.37* <0.30* 0.32* <0.30*  CALCIUM 7.2* 7.2* 6.7*  --  7.4* 7.7* 7.2* 7.2*  MG  --   --   --   --   --  1.3* 1.5* 1.6*  PHOS 1.5* 1.9*  --  2.1*  --   --   --   --    GFR: CrCl cannot be calculated (This lab value cannot be used to calculate CrCl because it is not a number: <0.30). Liver Function Tests: Recent Labs  Lab 03/23/18 1842  AST 29  ALT 26  ALKPHOS 121  BILITOT 2.8*  PROT 4.8*  ALBUMIN 1.9*   No results for input(s): LIPASE, AMYLASE in the last 168 hours. Recent Labs  Lab 03/24/18 1908  AMMONIA 33   Coagulation Profile: Recent Labs  Lab 03/23/18 1842  INR 1.89   Cardiac Enzymes: No results for input(s): CKTOTAL, CKMB, CKMBINDEX, TROPONINI in the last 168 hours. BNP (last 3 results) No results for input(s): PROBNP in the last 8760 hours. HbA1C: No results for input(s): HGBA1C in the last 72 hours. CBG: Recent Labs  Lab 03/26/18 2110 03/27/18 0013 03/27/18 0509 03/27/18 0803 03/28/18 0826  GLUCAP 74 70 79 85 68*   Lipid Profile: No results for input(s): CHOL, HDL, LDLCALC, TRIG, CHOLHDL, LDLDIRECT in the last 72 hours. Thyroid Function Tests: No results for input(s): TSH, T4TOTAL, FREET4, T3FREE, THYROIDAB in the last 72 hours. Anemia Panel: No results for input(s): VITAMINB12, FOLATE, FERRITIN, TIBC, IRON, RETICCTPCT in the last 72 hours. Sepsis Labs: No results for input(s): PROCALCITON, LATICACIDVEN in the last 168 hours.  No results found for this or any previous visit (from the past 240 hour(s)).     Radiology Studies: No results found.    Scheduled Meds: . feeding supplement (ENSURE ENLIVE)  237 mL Oral TID BM  . folic acid  1 mg Intravenous Daily  . nicotine  21 mg Transdermal Daily  . scopolamine  1 patch Transdermal Q72H  . sodium chloride flush  10-40 mL Intracatheter Q12H  . thiamine injection  100 mg Intravenous Daily  . vancomycin  125 mg Oral Q6H   Or  . vancomycin (VANCOCIN) rectal  ENEMA  500 mg Rectal Q6H  Continuous Infusions: . dextrose 5 % and 0.45% NaCl 75 mL/hr at 03/28/18 0551  . potassium chloride 10 mEq (03/28/18 0954)     LOS: 14 days    Time spent: 25 minutes   Dessa Phi, DO [facility] [email or web address] Password [facility] 03/28/2018, 10:47 AM

## 2018-03-29 DIAGNOSIS — Z7189 Other specified counseling: Secondary | ICD-10-CM

## 2018-03-29 DIAGNOSIS — R4182 Altered mental status, unspecified: Secondary | ICD-10-CM

## 2018-03-29 DIAGNOSIS — A0472 Enterocolitis due to Clostridium difficile, not specified as recurrent: Secondary | ICD-10-CM

## 2018-03-29 DIAGNOSIS — F1010 Alcohol abuse, uncomplicated: Secondary | ICD-10-CM

## 2018-03-29 DIAGNOSIS — R1310 Dysphagia, unspecified: Secondary | ICD-10-CM

## 2018-03-29 DIAGNOSIS — Z515 Encounter for palliative care: Secondary | ICD-10-CM

## 2018-03-29 LAB — CBC
HCT: 25.5 % — ABNORMAL LOW (ref 39.0–52.0)
Hemoglobin: 8.4 g/dL — ABNORMAL LOW (ref 13.0–17.0)
MCH: 27.6 pg (ref 26.0–34.0)
MCHC: 32.9 g/dL (ref 30.0–36.0)
MCV: 83.9 fL (ref 78.0–100.0)
PLATELETS: 127 10*3/uL — AB (ref 150–400)
RBC: 3.04 MIL/uL — ABNORMAL LOW (ref 4.22–5.81)
RDW: 25 % — AB (ref 11.5–15.5)
WBC: 8.2 10*3/uL (ref 4.0–10.5)

## 2018-03-29 LAB — BASIC METABOLIC PANEL
Anion gap: 6 (ref 5–15)
CO2: 21 mmol/L — ABNORMAL LOW (ref 22–32)
Calcium: 7.5 mg/dL — ABNORMAL LOW (ref 8.9–10.3)
Chloride: 107 mmol/L (ref 98–111)
GLUCOSE: 76 mg/dL (ref 70–99)
POTASSIUM: 3.2 mmol/L — AB (ref 3.5–5.1)
SODIUM: 134 mmol/L — AB (ref 135–145)

## 2018-03-29 LAB — GLUCOSE, CAPILLARY
GLUCOSE-CAPILLARY: 103 mg/dL — AB (ref 70–99)
Glucose-Capillary: 67 mg/dL — ABNORMAL LOW (ref 70–99)

## 2018-03-29 LAB — MAGNESIUM: MAGNESIUM: 1.4 mg/dL — AB (ref 1.7–2.4)

## 2018-03-29 MED ORDER — POTASSIUM CHLORIDE 10 MEQ/100ML IV SOLN: 10.0000 meq | INTRAVENOUS | Status: DC

## 2018-03-29 MED ORDER — MAGNESIUM SULFATE 4 GM/100ML IV SOLN
4.0000 g | Freq: Once | INTRAVENOUS | Status: AC
  Administered 2018-03-29: 4 g via INTRAVENOUS
  Filled 2018-03-29: qty 100

## 2018-03-29 MED ORDER — DEXTROSE 50 % IV SOLN
INTRAVENOUS | Status: AC
  Administered 2018-03-29: 50 mL
  Filled 2018-03-29: qty 50

## 2018-03-29 MED ORDER — POTASSIUM CHLORIDE 10 MEQ/100ML IV SOLN
10.0000 meq | INTRAVENOUS | Status: AC
  Administered 2018-03-29 (×4): 10 meq via INTRAVENOUS
  Filled 2018-03-29 (×2): qty 100

## 2018-03-29 NOTE — Progress Notes (Signed)
CSW following for assistance with disposition- planning to admit to SNF with palliative care following at DC. Spoke with wife and selected facility [facility]) today for update.  Sharren Bridge, MSW, LCSW Clinical Social Work 03/29/2018 [phone]

## 2018-03-29 NOTE — Progress Notes (Signed)
Physical Therapy Treatment Patient Details Name: James [name] MRN: [id number] DOB: April 19, 1956 Today's Date: 03/29/2018    History of Present Illness James [name] is an 62 y.o. 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  He was brought in because of progressive weakness near syncope and confusion    PT Comments    Pt progressing poorly.  Still requires + 2 assist and unable to functionally control mvts to self assist with transfers and gait.  Increased time to position pt in recliner with multiple pillows to achieve midline upright posture.    Follow Up Recommendations  SNF;Supervision/Assistance - 24 hour     Equipment Recommendations  None recommended by PT    Recommendations for Other Services       Precautions / Restrictions Precautions Precautions: Fall Restrictions Weight Bearing Restrictions: No    Mobility  Bed Mobility Overal bed mobility: Needs Assistance Bed Mobility: Supine to Sit     Supine to sit: +2 for physical assistance;+2 for safety/equipment;Total assist(pt 10% )     General bed mobility comments: required increased assist.  Great difficulty coordinating mvts to complete task.  LOB all planes.  Unable to self sit EOB and unable to static sit still EOB.    Transfers Overall transfer level: Needs assistance Equipment used: Rolling walker (2 wheeled) Transfers: Sit to/from Stand   Stand pivot transfers: +2 physical assistance;+2 safety/equipment;Total assist       General transfer comment: severe posterior lean and constant ataxic mvts.  Unable to functionally use walker and unable to right self to midline.  Constant motion.    Ambulation/Gait             General Gait Details: unable to take any functional steps.  Unable to suuport self upright.  Constant mvts with poor  functional control.     Stairs             Wheelchair Mobility    Modified Rankin (Stroke Patients Only)       Balance                                            Cognition   Behavior During Therapy: Flat affect                                   General Comments: poor eye contact and constant motion      Exercises      General Comments        Pertinent Vitals/Pain      Home Living                      Prior Function            PT Goals (current goals can now be found in the care plan section) Progress towards PT goals: Progressing toward goals(poorly)    Frequency    Min 2X/week      PT Plan Current plan remains appropriate    Co-evaluation              AM-PAC PT "6 Clicks" Daily Activity  Outcome Measure  Difficulty turning over in bed (including  adjusting bedclothes, sheets and blankets)?: A Lot Difficulty moving from lying on back to sitting on the side of the bed? : A Lot Difficulty sitting down on and standing up from a chair with arms (e.g., wheelchair, bedside commode, etc,.)?: A Lot Help needed moving to and from a bed to chair (including a wheelchair)?: A Lot Help needed walking in hospital room?: A Lot Help needed climbing 3-5 steps with a railing? : Total 6 Click Score: 11    End of Session Equipment Utilized During Treatment: Gait belt   Patient left: in chair;with chair alarm set;with call bell/phone within reach Nurse Communication: Mobility status PT Visit Diagnosis: Difficulty in walking, not elsewhere classified (R26.2);Muscle weakness (generalized) (M62.81);Adult, failure to thrive (R62.7)     Time: 9163-8466 PT Time Calculation (min) (ACUTE ONLY): 26 min  Charges:  $Therapeutic Activity: 23-37 mins                    G Codes:       Rica Koyanagi  PTA [facility]  Acute  Rehab Pager      [phone]

## 2018-03-29 NOTE — Progress Notes (Signed)
PROGRESS NOTE    THUNDER BRIDGEWATER  IWL:[id number] DOB: 04-Oct-1955 DOA: 03/14/2018 PCP: Denita Lung, MD     Brief Narrative:  James [name] is a 62 y.o.malepast medical history recurrent severe anemia requiring multiple transfusions due to AVMs with chronic blood loss, alcohol abuse who came in for weakness, near syncope and was found to have a hemoglobin of 2. He was noted to be confused in the ED with Temp of 91.2, BP 92/54, WBC 43.000. He was admitted for blood transfusions, started on IV antibiotic cefepime. Due to diarrhea, C Diff was checked which returned positive. He was started on PO vanco via NGT. Due to acute encephalopathy, MRI was completed which revealed bilateral subdural hematomas vs intracranial hypotension vs pachymeningitis. Neurology was consulted and they have recommended LP, which has not been completed due to patient's agitation.   New events last 24 hours / Subjective: No new events overnight. Patient without new complaints; he is alert, oriented to "Zacarias Pontes" "2009" and able to tell me that his abdomen does not hurt. No other history able to be gathered due to incoherent mumbling.   Assessment & Plan:   Principal Problem:   Symptomatic Anemia  Active Problems:   Tobacco abuse   Alcohol abuse   ILD (interstitial lung disease) (HCC)   Cachexia (HCC)   Severe protein-calorie malnutrition (HCC)   Iron deficiency anemia due to chronic blood loss   Symptomatic anemia   Lactic acidosis   Microcytic anemia   Thrombocytopenia (HCC)   Acute metabolic encephalopathy   Watery diarrhea   Symptomatic anemia  -Newly presented with hemoglobin of 2.  He has history of AVM.  FOBT have been negative x2.  He received total 6 units packed red blood cells.  Iron transfusion was also administered -Trend CBC  Sepsis due to C diff colitis -Blood cultures negative  -Finished PO vanco x 7 days   Acute toxic encephalopathy -Unclear etiology, initially thought to be  delirium due to alcohol withdrawal.  He was managed with Ativan and CIWA scale.  MRI finally completed on 7/11 which revealed diffuse smooth dural thickening over the cerebral convexities. No significant mass effect. Findings probably represent thin subdural hematomas or intracranial hypotension, less likely pachymeningitis. -Neurology consulted -LP recommended, ordered on 7/12, unable to complete due to patient's lack of cooperation, agitation -Ammonia normal -Spoke with Dr. Rory Percy on 7/18.  Patient has improved since last week when LP was recommended.  We discussed the patient's case, can hold off on LP for now.  Could consider CT head without contrast or repeat MRI to follow-up on his scan from 7/11.  If patient has clinical change, can call neurology back for assistance. -Waxing and waning, but more alert than reported last week. Remains confused on my examination but per family, he is more interactive with them    Tobacco abuse -Nicotine patch.  Increased dose per wife's request.  Alcohol abuse and withdrawal -Now out of window for withdrawal.  Patient was admitted on 7/7  Hepatomegaly with coagulopathy, thrombocytopenia  -?Secondary to alcohol abuse   Severe protein calorie malnutrition -Encourage oral intake  Dysphagia -Speech therapist recommending dysphagia 3 diet or full liquid diet due to continued risk of aspiration   Hypokalemia -Replace, trend   Hypomagnesemia -Replace, trend  Goals of care -Patient has been here >10 days, altered mentation, waxing/waning, dysphagia and aspiration risk, poor PO intake, requiring IV ativan for agitation, without any big improvement. Discussed with wife that if he continues  on this projectory, he may not have meaningful recovery for home or SNF. Also discussed feeding tube is an aggressive option and may not necessarily increase quality of life, he may attempt to pull out tube, may still aspirate with tube feedings etc -Wife spoke with  palliative care team 7/20. We discussed their conversation which went well. She is considering discharge to SNF with palliative care team to follow. She does not feel he is ready for hospice just yet, although she will consider it.    DVT prophylaxis: SCDs Code Status: Full Family Communication: No family at bedside  Disposition Plan: SNF with palliative care team, next 24-48 hours    Consultants:   Neurology   Palliative care   Antimicrobials:  Anti-infectives (From admission, onward)   Start     Dose/Rate Route Frequency Ordered Stop   03/22/18 1600  metroNIDAZOLE (FLAGYL) tablet 500 mg  Status:  Discontinued     500 mg Oral Every 8 hours 03/22/18 1325 03/23/18 0809   03/21/18 0000  vancomycin (VANCOCIN) 50 mg/mL oral solution 125 mg     125 mg Oral Every 6 hours 03/20/18 2136 03/28/18 2359   03/21/18 0000  vancomycin (VANCOCIN) 500 mg in sodium chloride irrigation 0.9 % 100 mL ENEMA     500 mg Rectal Every 6 hours 03/20/18 2136 03/28/18 2359   03/19/18 2000  vancomycin (VANCOCIN) 500 mg in sodium chloride irrigation 0.9 % 100 mL ENEMA  Status:  Discontinued     500 mg Rectal Every 6 hours 03/19/18 1626 03/20/18 2136   03/19/18 1630  metroNIDAZOLE (FLAGYL) IVPB 500 mg  Status:  Discontinued     500 mg 100 mL/hr over 60 Minutes Intravenous Every 8 hours 03/19/18 1626 03/22/18 1325   03/17/18 1000  vancomycin (VANCOCIN) 50 mg/mL oral solution 125 mg  Status:  Discontinued     125 mg Oral 4 times daily 03/17/18 0739 03/20/18 2136   03/15/18 0800  ceFEPIme (MAXIPIME) 1 g in sodium chloride 0.9 % 100 mL IVPB  Status:  Discontinued     1 g 200 mL/hr over 30 Minutes Intravenous Every 8 hours 03/15/18 0603 03/17/18 0737   03/15/18 0130  ceFEPIme (MAXIPIME) 2 g in sodium chloride 0.9 % 100 mL IVPB     2 g 200 mL/hr over 30 Minutes Intravenous  Once 03/15/18 0117 03/15/18 0220       Objective: Vitals:   03/27/18 1957 03/28/18 0400 03/28/18 1509 03/28/18 2102  BP: 97/72 100/75 (!)  110/58 114/66  Pulse: (!) 102 99 94 94  Resp: 18 14 16 18   Temp: 97.9 F (36.6 C) 98 F (36.7 C) 98.6 F (37 C) 97.8 F (36.6 C)  TempSrc: Oral Oral Oral Oral  SpO2: 97% 98% 95% 99%  Weight:  52 kg (114 lb 10.2 oz)    Height:        Intake/Output Summary (Last 24 hours) at 03/29/2018 1012 Last data filed at 03/29/2018 0630 Gross per 24 hour  Intake 2036.25 ml  Output 2300 ml  Net -263.75 ml   Filed Weights   03/26/18 0700 03/27/18 0607 03/28/18 0400  Weight: 50.8 kg (112 lb) 51.1 kg (112 lb 10.5 oz) 52 kg (114 lb 10.2 oz)    Examination: General exam: Appears calm and comfortable  Respiratory system: Clear to auscultation. Respiratory effort normal. Cardiovascular system: S1 & S2 heard, RRR. No JVD, murmurs, rubs, gallops or clicks. No pedal edema. Gastrointestinal system: Abdomen is nondistended, soft and nontender. No organomegaly  or masses felt. Normal bowel sounds heard. Central nervous system: Alert and oriented to self only  Extremities: Symmetric Skin: No rashes, lesions or ulcers on exposed skin     Data Reviewed: I have personally reviewed following labs and imaging studies  CBC: Recent Labs  Lab 03/25/18 0633 03/26/18 0710 03/27/18 0500 03/28/18 0500 03/29/18 0500  WBC 11.7* 9.1 9.3 8.5 8.2  HGB 9.3* 9.4* 8.4* 8.1* 8.4*  HCT 28.7* 29.4* 26.4* 25.2* 25.5*  MCV 83.9 84.0 85.2 84.8 83.9  PLT 114* 136* 124* 132* 086*   Basic Metabolic Panel: Recent Labs  Lab 03/23/18 0612  03/24/18 0617 03/25/18 0633 03/26/18 0710 03/27/18 0500 03/28/18 0500 03/29/18 0500  NA 131*   < >  --  132* 135 134* 134* 134*  K 3.8   < >  --  2.9* 2.8* 2.7* 3.0* 3.2*  CL 109   < >  --  108 107 108 109 107  CO2 19*   < >  --  19* 21* 21* 21* 21*  GLUCOSE 92   < >  --  78 117* 221* 208* 76  BUN <5*   < >  --  <5* <5* <5* <5* <5*  CREATININE 0.32*   < >  --  0.37* <0.30* 0.32* <0.30* <0.30*  CALCIUM 7.2*   < >  --  7.4* 7.7* 7.2* 7.2* 7.5*  MG  --   --   --   --  1.3* 1.5*  1.6* 1.4*  PHOS 1.9*  --  2.1*  --   --   --   --   --    < > = values in this interval not displayed.   GFR: CrCl cannot be calculated (This lab value cannot be used to calculate CrCl because it is not a number: <0.30). Liver Function Tests: Recent Labs  Lab 03/23/18 1842  AST 29  ALT 26  ALKPHOS 121  BILITOT 2.8*  PROT 4.8*  ALBUMIN 1.9*   No results for input(s): LIPASE, AMYLASE in the last 168 hours. Recent Labs  Lab 03/24/18 1908  AMMONIA 33   Coagulation Profile: Recent Labs  Lab 03/23/18 1842  INR 1.89   Cardiac Enzymes: No results for input(s): CKTOTAL, CKMB, CKMBINDEX, TROPONINI in the last 168 hours. BNP (last 3 results) No results for input(s): PROBNP in the last 8760 hours. HbA1C: No results for input(s): HGBA1C in the last 72 hours. CBG: Recent Labs  Lab 03/27/18 0509 03/27/18 0803 03/28/18 0826 03/29/18 0827 03/29/18 0931  GLUCAP 79 85 68* 67* 103*   Lipid Profile: No results for input(s): CHOL, HDL, LDLCALC, TRIG, CHOLHDL, LDLDIRECT in the last 72 hours. Thyroid Function Tests: No results for input(s): TSH, T4TOTAL, FREET4, T3FREE, THYROIDAB in the last 72 hours. Anemia Panel: No results for input(s): VITAMINB12, FOLATE, FERRITIN, TIBC, IRON, RETICCTPCT in the last 72 hours. Sepsis Labs: No results for input(s): PROCALCITON, LATICACIDVEN in the last 168 hours.  No results found for this or any previous visit (from the past 240 hour(s)).     Radiology Studies: No results found.    Scheduled Meds: . feeding supplement (ENSURE ENLIVE)  237 mL Oral TID BM  . folic acid  1 mg Intravenous Daily  . nicotine  21 mg Transdermal Daily  . scopolamine  1 patch Transdermal Q72H  . sodium chloride flush  10-40 mL Intracatheter Q12H  . thiamine injection  100 mg Intravenous Daily   Continuous Infusions: . dextrose 5 % and 0.45% NaCl  75 mL/hr at 03/29/18 0512  . magnesium sulfate 1 - 4 g bolus IVPB    . potassium chloride 10 mEq (03/29/18 0809)      LOS: 15 days    Time spent: 20 minutes   Dessa Phi, DO [facility] [email or web address] Password [facility] 03/29/2018, 10:12 AM

## 2018-03-29 NOTE — Progress Notes (Signed)
Nutrition Follow-up  DOCUMENTATION CODES:   Severe malnutrition in context of chronic illness, Underweight  INTERVENTION:   Pt remains at risk for refeeding syndrome. Potassium and magnesium levels remain low. Recommend continued supplementation.  - Continue Ensure Enlive po BID, each supplement provides 350 kcal and 20 grams of protein  - Follow for GOC  NUTRITION DIAGNOSIS:   Severe Malnutrition related to chronic illness(ETOH abuse) as evidenced by energy intake < or equal to 75% for > or equal to 1 month, severe fat depletion, severe muscle depletion.  Ongoing  GOAL:   Patient will meet greater than or equal to 90% of their needs  Unmet  MONITOR:   PO intake, Supplement acceptance, Weight trends, Labs, I & O's  REASON FOR ASSESSMENT:   Consult Enteral/tube feeding initiation and management  ASSESSMENT:   62 y.o. 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  He was brought in because of progressive weakness near syncope and confusion  7/12 - pt pulled NGT out, TF stopped 7/16 - MBS with recommendations for Dysphagia 3, think liquids  Spoke with RN, NT, and DO. Attempted to speak with pt, but pt was very difficult to understand.  Per NT, pt did not have breakfast this morning. Per discussion with RN, pt has been lethargic and RN has been hesitant to provide pt with oral nutrition supplement due to increased secretions. RN also reports that IR tried to replace pt's NG tube after pt removed it but was unsuccessful and not likely to try again.  Discussed pt with DO who is aware of pt's declining nutritional status due to lack of PO intake and no TF at this time. Per DO, pt's wife is hesitant to choose hospice care. DO is following closely. RD to monitor for GOC.  Noted pt's weight has fluctuated between 101-122  lbs during admission. Suspect weight fluctuations related to fluid status as pt is now +17.7 L.  Meal Completion: 0%  Medications reviewed and include: Ensure Enlive TID, 1 mg folic acid daily, 948 mg thiamine daily, 10 mEq KCl x 6 runs today, 4 g IV magnesium sulfate once  Labs reviewed: potassium 3.2 (L), sodium 134 (L), CO2 21 (L), calcium 7.5 (L), hemoglobin 8.4 (L), HCT 25.5 (L), magnesium 1.4 (L) CBG's: 103, 67, 68 x 24 hours  UOP: 1300 ml x 24 hours I/O's: +17.7 L since admission  Diet Order:   Diet Order           Diet full liquid Room service appropriate? Yes; Fluid consistency: Thin  Diet effective now          EDUCATION NEEDS:      Skin:  Skin Assessment: Reviewed RN Assessment  Last BM:  03/28/18 large type 7  Height:   Ht Readings from Last 1 Encounters:  03/15/18 5\' 10"  (1.778 m)    Weight:   Wt Readings from Last 1 Encounters:  03/28/18 114 lb 10.2 oz (52 kg)    Ideal Body Weight:  75.5 kg  BMI:  Body mass index is 16.45 kg/m.  Estimated Nutritional Needs:   Kcal:  1800-2000 kcal/day  Protein:  85-95g  Fluid:  2L/day    Gaynell Face, MS, RD, LDN Pager: [phone] Weekend/After Hours: [phone]

## 2018-03-29 NOTE — Progress Notes (Signed)
Daily Progress Note   Patient Name: James [name]       Date: 03/29/2018 DOB: 10/04/1955  Age: 62 y.o. MRN#: [id number] Attending Physician: Dessa Phi, DO Primary Care Physician: Denita Lung, MD Admit Date: 03/14/2018  Reason for Consultation/Follow-up: Establishing goals of care  Subjective: Upon arrival to room, patient sitting up in recliner. Wakes to voice. He can tell me his name and that he is at the hospital. Denies pain or discomfort. Is ready to "get [place] here."   Spoke with wife, [name] via telephone. She asks about the plan moving forward at SNF with nutrition. I explain SLP and RD recommendations for continued dysphagia/full liquid diet. Also ensure BID. [name] tells me that Dannielle Huh loves ensures and has been finishing his ensures the past two days at the hospital. We discussed the possibility of needing a feeding tube again-- explained cortrak is short term and that he cannot go to SNF with cortrak. Discussed PEG tube placement. [name] tells me if necessary, she wishes for feeding tube to be attempted again. She states "I don't want him to be given up on" or to "waste away" by not eating. She is hopeful he will show improvement at SNF. She asked if there was a reversible cause for his difficulty swallowing. I reassured her I would go check for thrush (which I did not see on assessment).   Updated attending on my conversation with wife.   Length of Stay: 15  Current Medications: Scheduled Meds:  . feeding supplement (ENSURE ENLIVE)  237 mL Oral TID BM  . folic acid  1 mg Intravenous Daily  . nicotine  21 mg Transdermal Daily  . scopolamine  1 patch Transdermal Q72H  . sodium chloride flush  10-40 mL Intracatheter Q12H  . thiamine injection  100 mg Intravenous Daily     Continuous Infusions: . dextrose 5 % and 0.45% NaCl 75 mL/hr at 03/29/18 0512  . magnesium sulfate 1 - 4 g bolus IVPB 4 g (03/29/18 1245)    PRN Meds: albuterol, fluticasone, LORazepam, [DISCONTINUED] ondansetron **OR** ondansetron (ZOFRAN) IV, sodium chloride flush  Physical Exam  Constitutional: He is cooperative. He appears ill.  HENT:  Head: Normocephalic and atraumatic.  Pulmonary/Chest: No accessory muscle usage. No tachypnea. No respiratory distress.  secretions  Neurological: He is alert.  Oriented  to person/place  Skin: Skin is warm and dry.  Psychiatric: His speech is delayed. He is inattentive.  Nursing note and vitals reviewed.          Vital Signs: BP 114/66 (BP Location: Left Arm)   Pulse 94   Temp 97.8 F (36.6 C) (Oral)   Resp 18   Ht 5\' 10"  (1.778 m)   Wt 52 kg (114 lb 10.2 oz)   SpO2 99%   BMI 16.45 kg/m  SpO2: SpO2: 99 % O2 Device: O2 Device: Room Air O2 Flow Rate: O2 Flow Rate (L/min): 2 L/min  Intake/output summary:   Intake/Output Summary (Last 24 hours) at 03/29/2018 1409 Last data filed at 03/29/2018 0630 Gross per 24 hour  Intake 2036.25 ml  Output 2300 ml  Net -263.75 ml   LBM: Last BM Date: 03/28/18 Baseline Weight: Weight: 45.9 kg (101 lb 3.1 oz) Most recent weight: Weight: 52 kg (114 lb 10.2 oz)       Palliative Assessment/Data: PPS 40%   Flowsheet Rows     Most Recent Value  Intake Tab  Referral Department  Hospitalist  Clinical Assessment  Psychosocial & Spiritual Assessment  Palliative Care Outcomes      Patient Active Problem List   Diagnosis Date Noted  . Thrombocytopenia ([place]) 03/16/2018  . Acute metabolic encephalopathy 67/08/4579  . Watery diarrhea 03/16/2018  . Microcytic anemia 09/10/2017  . Acute respiratory failure with hypoxia ([place])   . Hypokalemia 03/02/2017  . Symptomatic anemia 03/02/2017  . Hyponatremia 03/02/2017  . Right hip pain 03/02/2017  . Lactic acidosis   . History of peptic ulcer  disease 10/06/2016  . Poor dentition 10/06/2016  . Iron deficiency anemia due to chronic blood loss 09/17/2016  . Alcoholic gastritis with hemorrhage   . Marijuana abuse   . Severe protein-calorie malnutrition ([place]) 01/02/2015  . ILD (interstitial lung disease) ([place]) 10/24/2011  . Pulmonary nodule 10/24/2011  . Cachexia ([place]) 10/24/2011  . Benign neoplasm of colon 10/03/2011  . Symptomatic Anemia  10/01/2011  . Tobacco abuse 10/01/2011  . Alcohol abuse 10/01/2011  . Dysphagia, unspecified(787.20) 10/01/2011    Palliative Care Assessment & Plan   Patient Profile: 62 y.o. male  with past medical history of recurrent severe anemia (hx AVM) alcohol abuse admitted on 03/14/2018 with weakness and low Hgb and found to have sepsis from c-diff colitis.  Continues to have acute encephalopathy.  Concern for aspiration as well.  Palliative consulted for goals of care.     Assessment: Acute toxic encephalopathy Sepsis secondary to cdiff colitis Symptomatic anemia ETOH abuse Hepatomegaly Coagulopathy Thrombocytopenia Severe protein calorie malnutrition Dysphagia  Recommendations/Plan:  Continue FULL code/FULL scope treatment. Slight clinical improvement in cognitive and functional status. Poor nutritional status but will accept Ensures.   Wife willing to pursue feeding tube placement if recommended by attending. "I don't want him to be given up on." Updated Dr. Maylene Roes.   Likely SNF for rehab. Would benefit from outpatient palliative f/u.   Goals of Care and Additional Recommendations:  Limitations on Scope of Treatment: Full Scope Treatment  Code Status: FULL   Code Status Orders  (From admission, onward)        Start     Ordered   03/14/18 2253  Full code  Continuous     03/14/18 2252    Code Status History    Date Active Date Inactive Code Status Order ID Comments User Context   09/10/2017 0102 09/14/2017 1959 Full Code [id number]  Jennelle Human  B, NP ED   03/02/2017 2314  03/04/2017 2156 Full Code [id number]  Ivor Costa, MD ED   09/17/2016 2152 09/21/2016 1818 Full Code [id number]  Etta Quill, DO ED   08/23/2015 1006 08/26/2015 1701 Full Code [id number]  Bonnielee Haff, MD Inpatient   08/21/2015 0513 08/21/2015 0540 Full Code [id number]  Ivor Costa, MD ED   01/01/2015 2218 01/05/2015 2232 Full Code [id number]  Deneise Lever, MD ED       Prognosis:   Unable to determine  Discharge Planning:  [place] for rehab with Palliative care service follow-up  Care plan was discussed with patient, wife, Dr. Maylene Roes  Thank you for allowing the Palliative Medicine Team to assist in the care of this patient.   Time In: 1205 Time Out: 1240 Total Time 37min Prolonged Time Billed  no      Greater than 50%  of this time was spent counseling and coordinating care related to the above assessment and plan.  Ihor Dow, FNP-C Palliative Medicine Team  Phone: [phone] Fax: [phone]  Please contact Palliative Medicine Team phone at [phone] for questions and concerns.

## 2018-03-30 LAB — CBC
HCT: 25.9 % — ABNORMAL LOW (ref 39.0–52.0)
HEMOGLOBIN: 8.3 g/dL — AB (ref 13.0–17.0)
MCH: 27.4 pg (ref 26.0–34.0)
MCHC: 32 g/dL (ref 30.0–36.0)
MCV: 85.5 fL (ref 78.0–100.0)
Platelets: 111 10*3/uL — ABNORMAL LOW (ref 150–400)
RBC: 3.03 MIL/uL — AB (ref 4.22–5.81)
RDW: 23.9 % — ABNORMAL HIGH (ref 11.5–15.5)
WBC: 8.2 10*3/uL (ref 4.0–10.5)

## 2018-03-30 LAB — GLUCOSE, CAPILLARY
GLUCOSE-CAPILLARY: 143 mg/dL — AB (ref 70–99)
GLUCOSE-CAPILLARY: 24 mg/dL — AB (ref 70–99)
Glucose-Capillary: 77 mg/dL (ref 70–99)
Glucose-Capillary: 73 mg/dL (ref 70–99)

## 2018-03-30 LAB — MAGNESIUM: MAGNESIUM: 1.5 mg/dL — AB (ref 1.7–2.4)

## 2018-03-30 LAB — BASIC METABOLIC PANEL
ANION GAP: 6 (ref 5–15)
BUN: <5 mg/dL — ABNORMAL LOW (ref 6–20)
CALCIUM: 7.5 mg/dL — AB (ref 8.9–10.3)
CHLORIDE: 107 mmol/L (ref 98–111)
CO2: 21 mmol/L — AB (ref 22–32)
Glucose, Bld: 80 mg/dL (ref 70–99)
Potassium: 3.4 mmol/L — ABNORMAL LOW (ref 3.5–5.1)
SODIUM: 134 mmol/L — AB (ref 135–145)

## 2018-03-30 MED ORDER — POTASSIUM CHLORIDE 10 MEQ/100ML IV SOLN
10.0000 meq | INTRAVENOUS | Status: AC
  Administered 2018-03-30 (×4): 10 meq via INTRAVENOUS
  Filled 2018-03-30 (×4): qty 100

## 2018-03-30 MED ORDER — MAGNESIUM SULFATE 2 GM/50ML IV SOLN
2.0000 g | Freq: Once | INTRAVENOUS | Status: AC
  Administered 2018-03-30: 2 g via INTRAVENOUS
  Filled 2018-03-30: qty 50

## 2018-03-30 MED ORDER — JEVITY 1.2 CAL PO LIQD: 1000.0000 mL | ORAL | Status: DC

## 2018-03-30 MED ORDER — DEXTROSE 50 % IV SOLN
INTRAVENOUS | Status: AC
Start: 2018-03-30 — End: 2018-03-30
  Administered 2018-03-30: 50 mL
  Filled 2018-03-30: qty 50

## 2018-03-30 MED ORDER — LIP MEDEX EX OINT
TOPICAL_OINTMENT | CUTANEOUS | Status: AC
  Administered 2018-03-30: 08:00:00
  Filled 2018-03-30: qty 7

## 2018-03-30 NOTE — Progress Notes (Signed)
  Speech Language Pathology Treatment: Dysphagia  Patient Details Name: James [name] MRN: [id number] DOB: 09-03-1956 Today's Date: 03/30/2018 Time: 7588-3254 SLP Time Calculation (min) (ACUTE ONLY): 10 min  Assessment / Plan / Recommendation Clinical Impression  Pt with pt attempts to cough on command but largely nonproductive, voice remained gurgly despite total cues to clear throat/cough/"hock"; SLP did remove viscous yellow secretions from oral cavity after congested cough.   Did not provide pt with po intake due to gross secretion aspirating.  Pt also appears today with decreased attention to right side, tongue midline and no facial asymmetry.  Has h/o pons cva.    Unfortunately for this gentleman he has not demonstrated improvement with swallowing over coarse of his hospital stay.  Feeding tube will NOT prevent him from aspirating as he is grossly aspirating secretions.    At this time do not recommend po diet for this pt and recommend follow up with palliative team re: care plan.  Advised pt to strengthen cough/hock when voice is wet/gurgly.  Pt remains high aspiration and malnutrition risk.  He unfortunately today demonstrates confusion and is not consistently following directions which will increase his aspiration risk.    HPI HPI: 62 yo male adm to [facility] with symptomatic anemia, PMH + for etoh use, cachexia, former smoker, Hemoglobin 2 upon admit.  Swallow eval ordered due to pt having more difficulty.  Pt cxr showed persistent prominence ? edema, no consolidative pna.  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    Pt had MBS yesterday and diet was continued with precautions.  RN reports pt not tolerating po well today - significant coughing with intake of medicine with pudding.  Per RN, pt required NT suctioning last night - today he is disoriented and voice was gurgly at baseline. Pt with ammonia level of 230 per lab staff.  Pt has been placed on diet by MD- he  kept pulling out NG tube.        SLP Plan  Other (Comment)(pt is not making progress, not tolerating po, continue to aspirate secretions)       Recommendations  Diet recommendations: NPO(x single tsps water for comfort)                Oral Care Recommendations: Oral care QID Follow up Recommendations: None SLP Visit Diagnosis: Dysphagia, oropharyngeal phase (R13.12) Plan: Other (Comment)(pt is not making progress, not tolerating po, continue to aspirate secretions)       GO                [name] 03/30/2018, 3:39 PM  [name], [place] [facility] SLP [phone]

## 2018-03-30 NOTE — Progress Notes (Signed)
PROGRESS NOTE    James [name]  PXT:[id number] DOB: 02-08-1956 DOA: 03/14/2018 PCP: Denita Lung, MD     Brief Narrative:  James [name] is a 62 y.o.malepast medical history recurrent severe anemia requiring multiple transfusions due to AVMs with chronic blood loss, alcohol abuse who came in for weakness, near syncope and was found to have a hemoglobin of 2. He was noted to be confused in the ED with Temp of 91.2, BP 92/54, WBC 43.000. He was admitted for blood transfusions, started on IV antibiotic cefepime. Due to diarrhea, C Diff was checked which returned positive. He was started on PO vanco via NGT. Due to acute encephalopathy, MRI was completed which revealed bilateral subdural hematomas vs intracranial hypotension vs pachymeningitis. Neurology was consulted and they have recommended LP, which has not been completed due to patient's agitation. Hospitalization complicated due to patient's altered mentation, aspiration risk, poor PO intake.   New events last 24 hours / Subjective: Hypoglycemic this morning. Patient remains alert but confused today.   Spoke with wife over the phone today.  Wife does not want to give up on him yet, states that he is more alert and interactive with her.  Last night, her and the neighbors visited and had a good visit.  She does want to go ahead with cortrack and PEG tube placement. We discussed that this does not eliminate his aspiration risk. He also has hx of removing his NGT last week.   Assessment & Plan:   Principal Problem:   Symptomatic Anemia  Active Problems:   Tobacco abuse   Alcohol abuse   ILD (interstitial lung disease) (HCC)   Cachexia (HCC)   Severe protein-calorie malnutrition (HCC)   Iron deficiency anemia due to chronic blood loss   Symptomatic anemia   Lactic acidosis   Microcytic anemia   Thrombocytopenia (HCC)   Acute metabolic encephalopathy   Watery diarrhea   Palliative care by specialist   Goals of care,  counseling/discussion   C. difficile colitis   Altered mental status   Dysphagia   Symptomatic anemia  -Newly presented with hemoglobin of 2.  He has history of AVM.  FOBT have been negative x2.  He received total 6 units packed red blood cells.  Iron transfusion was also administered -Trend CBC  Sepsis due to C diff colitis -Blood cultures negative  -Finished PO vanco x 7 days   Acute toxic encephalopathy -Unclear etiology, initially thought to be delirium due to alcohol withdrawal.  He was managed with Ativan and CIWA scale.  MRI finally completed on 7/11 which revealed diffuse smooth dural thickening over the cerebral convexities. No significant mass effect. Findings probably represent thin subdural hematomas or intracranial hypotension, less likely pachymeningitis. -Neurology consulted -LP recommended, ordered on 7/12, unable to complete due to patient's lack of cooperation, agitation -Ammonia normal -Spoke with Dr. Rory Percy on 7/18.  Patient has improved since last week when LP was recommended.  We discussed the patient's case, can hold off on LP for now.  Could consider CT head without contrast or repeat MRI to follow-up on his scan from 7/11.  If patient has clinical change, can call neurology back for assistance. -Waxing and waning, but more alert than reported last week. Remains confused on my examination but per family, he is more interactive with them    Severe protein calorie malnutrition Dysphagia and aspiration risk -Wife wants to proceed with feeding tube at this point. Cortrak only able to be placed at [facility]. I've ordered for feeding tube (NG) and PEG per IR consult.  -Will need to monitor electrolytes closely once feeding starts  -Patient has pulled out his NG numerous times last week; wife aware of risk of patient removing tube which increase risk of aspiration   Tobacco abuse -Nicotine patch.  Increased dose per wife's request.  Alcohol abuse and  withdrawal -Now out of window for withdrawal.  Patient was admitted on 7/7  Hepatomegaly with coagulopathy, thrombocytopenia  -?Secondary to alcohol abuse   Hypokalemia -Replace, trend   Hypomagnesemia -Replace, trend  Goals of care -Patient has been here >10 days, altered mentation, waxing/waning, dysphagia and aspiration risk, poor PO intake, requiring IV ativan for agitation, without any big improvement. Discussed with wife that if he continues on this projectory, he may not have meaningful recovery for home or SNF. Also discussed feeding tube is an aggressive option and may not necessarily increase quality of life, he may attempt to pull out tube, may still aspirate with tube feedings etc -Wife spoke with palliative care team 7/20. We discussed their conversation which went well. She is considering discharge to SNF with palliative care team to follow. She does not feel he is ready for hospice just yet, although she will consider it.  -Wife spoke with palliative care team 7/22 as well as discussed with me today. Wants to go ahead with feeding tube at this point    DVT prophylaxis: SCDs Code Status: Full Family Communication: No family at bedside, spoke with wife over the phone  Disposition Plan: SNF when stable. Will need PEG prior to discharge and stable electrolytes.    Consultants:   Neurology   Palliative care  IR   Antimicrobials:  Anti-infectives (From admission, onward)   Start     Dose/Rate Route Frequency Ordered Stop   03/22/18 1600  metroNIDAZOLE (FLAGYL) tablet 500 mg  Status:  Discontinued     500 mg Oral Every 8 hours 03/22/18 1325 03/23/18 0809   03/21/18 0000  vancomycin (VANCOCIN) 50 mg/mL oral solution 125 mg     125 mg Oral Every 6 hours 03/20/18 2136 03/28/18 2359   03/21/18 0000  vancomycin (VANCOCIN) 500 mg in sodium chloride irrigation 0.9 % 100 mL ENEMA     500 mg Rectal Every 6 hours 03/20/18 2136 03/28/18 2359   03/19/18 2000  vancomycin  (VANCOCIN) 500 mg in sodium chloride irrigation 0.9 % 100 mL ENEMA  Status:  Discontinued     500 mg Rectal Every 6 hours 03/19/18 1626 03/20/18 2136   03/19/18 1630  metroNIDAZOLE (FLAGYL) IVPB 500 mg  Status:  Discontinued     500 mg 100 mL/hr over 60 Minutes Intravenous Every 8 hours 03/19/18 1626 03/22/18 1325   03/17/18 1000  vancomycin (VANCOCIN) 50 mg/mL oral solution 125 mg  Status:  Discontinued     125 mg Oral 4 times daily 03/17/18 0739 03/20/18 2136   03/15/18 0800  ceFEPIme (MAXIPIME) 1 g in sodium chloride 0.9 % 100 mL IVPB  Status:  Discontinued     1 g 200 mL/hr over 30 Minutes Intravenous Every 8 hours 03/15/18 0603 03/17/18 0737   03/15/18 0130  ceFEPIme (MAXIPIME) 2 g in sodium chloride 0.9 % 100 mL IVPB     2 g 200 mL/hr over 30 Minutes Intravenous  Once 03/15/18 0117 03/15/18 0220       Objective: Vitals:   03/29/18 1436 03/29/18 2128 03/30/18 0233 03/30/18 0606  BP: 93/72 (!) 114/56  (!) 77/61  Pulse: 73 90  85  Resp:  20  16  Temp: 98.2 F (36.8 C) 97.6 F (36.4 C)  97.9 F (36.6 C)  TempSrc: Oral Axillary  Oral  SpO2:  99%  (!) 83%  Weight:   49.6 kg (109 lb 5.6 oz)   Height:        Intake/Output Summary (Last 24 hours) at 03/30/2018 1321 Last data filed at 03/29/2018 2132 Gross per 24 hour  Intake 1287.5 ml  Output 900 ml  Net 387.5 ml   Filed Weights   03/27/18 0607 03/28/18 0400 03/30/18 0233  Weight: 51.1 kg (112 lb 10.5 oz) 52 kg (114 lb 10.2 oz) 49.6 kg (109 lb 5.6 oz)   Examination: General exam: Appears calm and comfortable  Respiratory system: Clear to auscultation. Respiratory effort normal.  Cardiovascular system: S1 & S2 heard, RRR. No JVD, murmurs, rubs, gallops or clicks. No pedal edema. Gastrointestinal system: Abdomen is nondistended, soft and nontender. No organomegaly or masses felt. Normal bowel sounds heard. Central nervous system: Alert, does not answer questions  Extremities: Symmetric  Skin: No rashes, lesions or ulcers  on exposed skin     Data Reviewed: I have personally reviewed following labs and imaging studies  CBC: Recent Labs  Lab 03/26/18 0710 03/27/18 0500 03/28/18 0500 03/29/18 0500 03/30/18 0620  WBC 9.1 9.3 8.5 8.2 8.2  HGB 9.4* 8.4* 8.1* 8.4* 8.3*  HCT 29.4* 26.4* 25.2* 25.5* 25.9*  MCV 84.0 85.2 84.8 83.9 85.5  PLT 136* 124* 132* 127* 440*   Basic Metabolic Panel: Recent Labs  Lab 03/24/18 0617  03/26/18 0710 03/27/18 0500 03/28/18 0500 03/29/18 0500 03/30/18 0620  NA  --    < > 135 134* 134* 134* 134*  K  --    < > 2.8* 2.7* 3.0* 3.2* 3.4*  CL  --    < > 107 108 109 107 107  CO2  --    < > 21* 21* 21* 21* 21*  GLUCOSE  --    < > 117* 221* 208* 76 80  BUN  --    < > <5* <5* <5* <5* <5*  CREATININE  --    < > <0.30* 0.32* <0.30* <0.30* <0.30*  CALCIUM  --    < > 7.7* 7.2* 7.2* 7.5* 7.5*  MG  --   --  1.3* 1.5* 1.6* 1.4* 1.5*  PHOS 2.1*  --   --   --   --   --   --    < > = values in this interval not displayed.   GFR: CrCl cannot be calculated (This lab value cannot be used to calculate CrCl because it is not a number: <0.30). Liver Function Tests: Recent Labs  Lab 03/23/18 1842  AST 29  ALT 26  ALKPHOS 121  BILITOT 2.8*  PROT 4.8*  ALBUMIN 1.9*   No results for input(s): LIPASE, AMYLASE in the last 168 hours. Recent Labs  Lab 03/24/18 1908  AMMONIA 33   Coagulation Profile: Recent Labs  Lab 03/23/18 1842  INR 1.89   Cardiac Enzymes: No results for input(s): CKTOTAL, CKMB, CKMBINDEX, TROPONINI in the last 168 hours. BNP (last 3 results) No results for input(s): PROBNP in the last 8760 hours. HbA1C: No results for input(s): HGBA1C in the last 72 hours. CBG: Recent Labs  Lab 03/28/18 0826 03/29/18 0827 03/29/18 0931 03/30/18 0750 03/30/18 0828  GLUCAP 68* 67* 103* 24* 143*   Lipid Profile: No results for input(s): CHOL, HDL,  LDLCALC, TRIG, CHOLHDL, LDLDIRECT in the last 72 hours. Thyroid Function Tests: No results for input(s): TSH,  T4TOTAL, FREET4, T3FREE, THYROIDAB in the last 72 hours. Anemia Panel: No results for input(s): VITAMINB12, FOLATE, FERRITIN, TIBC, IRON, RETICCTPCT in the last 72 hours. Sepsis Labs: No results for input(s): PROCALCITON, LATICACIDVEN in the last 168 hours.  No results found for this or any previous visit (from the past 240 hour(s)).     Radiology Studies: No results found.    Scheduled Meds: . feeding supplement (ENSURE ENLIVE)  237 mL Oral TID BM  . folic acid  1 mg Intravenous Daily  . nicotine  21 mg Transdermal Daily  . scopolamine  1 patch Transdermal Q72H  . sodium chloride flush  10-40 mL Intracatheter Q12H  . thiamine injection  100 mg Intravenous Daily   Continuous Infusions: . dextrose 5 % and 0.45% NaCl 75 mL/hr at 03/30/18 0557     LOS: 16 days    Time spent: 25 minutes   Dessa Phi, DO [facility] [email or web address] Password [email or web address] 03/30/2018, 1:21 PM

## 2018-03-31 ENCOUNTER — Inpatient Hospital Stay (HOSPITAL_COMMUNITY): Payer: BC Managed Care – PPO

## 2018-03-31 LAB — GLUCOSE, CAPILLARY
GLUCOSE-CAPILLARY: 70 mg/dL (ref 70–99)
GLUCOSE-CAPILLARY: 72 mg/dL (ref 70–99)
GLUCOSE-CAPILLARY: 75 mg/dL (ref 70–99)
GLUCOSE-CAPILLARY: 83 mg/dL (ref 70–99)
Glucose-Capillary: 73 mg/dL (ref 70–99)
Glucose-Capillary: 67 mg/dL — ABNORMAL LOW (ref 70–99)
Glucose-Capillary: 67 mg/dL — ABNORMAL LOW (ref 70–99)
Glucose-Capillary: 49 mg/dL — ABNORMAL LOW (ref 70–99)

## 2018-03-31 LAB — CBC
HCT: 25.4 % — ABNORMAL LOW (ref 39.0–52.0)
Hemoglobin: 8.3 g/dL — ABNORMAL LOW (ref 13.0–17.0)
MCH: 27.8 pg (ref 26.0–34.0)
MCHC: 32.7 g/dL (ref 30.0–36.0)
MCV: 84.9 fL (ref 78.0–100.0)
PLATELETS: 115 10*3/uL — AB (ref 150–400)
RBC: 2.99 MIL/uL — ABNORMAL LOW (ref 4.22–5.81)
WBC: 9.8 10*3/uL (ref 4.0–10.5)

## 2018-03-31 LAB — BASIC METABOLIC PANEL
Anion gap: 7 (ref 5–15)
CO2: 18 mmol/L — ABNORMAL LOW (ref 22–32)
Calcium: 7.4 mg/dL — ABNORMAL LOW (ref 8.9–10.3)
Chloride: 108 mmol/L (ref 98–111)
Glucose, Bld: 75 mg/dL (ref 70–99)
Potassium: 3.5 mmol/L (ref 3.5–5.1)
SODIUM: 133 mmol/L — AB (ref 135–145)

## 2018-03-31 LAB — MAGNESIUM: Magnesium: 1.6 mg/dL — ABNORMAL LOW (ref 1.7–2.4)

## 2018-03-31 MED ORDER — OSMOLITE 1.5 CAL PO LIQD
1000.0000 mL | ORAL | Status: DC
  Filled 2018-03-31 (×3): qty 1000

## 2018-03-31 MED ORDER — DEXTROSE 50 % IV SOLN
INTRAVENOUS | Status: AC
  Administered 2018-03-31: 25 mL
  Filled 2018-03-31: qty 50

## 2018-03-31 NOTE — Progress Notes (Signed)
Follow-up GOC with wife, [name] tomorrow 7/25 around 7:30am.   NO CHARGE  Ihor Dow, FNP-C Palliative Medicine Team  Phone: [phone] Fax: [phone]

## 2018-03-31 NOTE — Progress Notes (Addendum)
Physical Therapy Treatment Patient Details Name: James [name] MRN: [id number] DOB: [birth year] Today's Date: 03/31/2018    History of Present Illness James [name] is an 62 y.o. male past medical history recurrent severe anemia , alcohol abuse ,cachectic  admitted 03/14/18  with  confusion, weakness and was found to have a hemoglobin of 2.      PT Comments    The patient requires total assist for mobility. Patient continues  With writhing and uncontrolled movements of the extremities. The patient favors looking to the left,  Is not  Making eye contact.  The patient continuosly sliding down in the bed.    Follow Up Recommendations  SNF     Equipment Recommendations  None recommended by PT    Recommendations for Other Services       Precautions / Restrictions Precautions Precautions: Fall Precaution Comments: flexiseal, mitts    Mobility  Bed Mobility   Bed Mobility: Supine to Sit Rolling: Max assist   Supine to sit: +2 for physical assistance;+2 for safety/equipment;Total assist Sit to supine: Max assist   General bed mobility comments: Patient found  down in bed and both legs are hanging over bed edge,required increased assist.  Great difficulty coordinating mvts to complete task.    Transfers Overall transfer level: Needs assistance Equipment used: 2 person hand held assist Transfers: Sit to/from Stand Sit to Stand: +2 safety/equipment;+2 physical assistance;Total assist;Max assist         General transfer comment: severe posterior lean and leans to left. Constant ataxic mvts.  writhing and  keeps head turned to left.  Ambulation/Gait                 Stairs             Wheelchair Mobility    Modified Rankin (Stroke Patients Only)       Balance Overall balance assessment: Needs assistance Sitting-balance support: Feet supported;Single extremity supported Sitting balance-Leahy Scale: Zero Sitting balance - Comments: posterior lean  intermittently,        Standing balance comment: posterior lean requiring max +2 assist with HHA                            Cognition Arousal/Alertness: Awake/alert   Overall Cognitive Status: Difficult to assess                         Following Commands: Follows multi-step commands inconsistently       General Comments: oriented to [place], no eye contact, keeps  head to left,       Exercises      General Comments        Pertinent Vitals/Pain Pain Assessment: No/denies pain    Home Living                      Prior Function            PT Goals (current goals can now be found in the care plan section) Progress towards PT goals: Not progressing toward goals - comment(the patient has mad no progress since evaluation, the patient  has not balance.)    Frequency    Min 1X/week      PT Plan Current plan remains appropriate;Frequency needs to be updated    Co-evaluation              AM-PAC PT "6 Clicks" Daily Activity  Outcome Measure  Difficulty turning over in bed (including adjusting bedclothes, sheets and blankets)?: Unable Difficulty moving from lying on back to sitting on the side of the bed? : Unable Difficulty sitting down on and standing up from a chair with arms (e.g., wheelchair, bedside commode, etc,.)?: Unable Help needed moving to and from a bed to chair (including a wheelchair)?: Total Help needed walking in hospital room?: Total Help needed climbing 3-5 steps with a railing? : Total 6 Click Score: 6    End of Session   Activity Tolerance: Other (comment)(difficult to know how patient tolerates) Patient left: in bed;with bed alarm set;with call bell/phone within reach Nurse Communication: Mobility status PT Visit Diagnosis: Ataxic gait (R26.0);Unsteadiness on feet (R26.81);Adult, failure to thrive (R62.7)     Time: 1206-1216 PT Time Calculation (min) (ACUTE ONLY): 10 min  Charges:  $Therapeutic  Activity: 8-22 mins                    G CodesTresa Endo PT [phone]    Claretha Cooper 03/31/2018, 1:41 PM

## 2018-03-31 NOTE — Progress Notes (Signed)
  Order received for placement of gastrostomy tube.  Images reviewed by Dr.Yamagata.  Patient is NOT a candidate for IR placed g-tube due to ascites, his stomach is posterior to his distal transverse colon and splenic flexure, and he has a history of gastric AVM and portal gastropathy, which increases risk of bleeding.  Recommend general surgery evaluate for placement of gastrostomy tube.   Jaylon Grode S Khriz Liddy PA-C 03/31/2018 1:37 PM

## 2018-03-31 NOTE — Progress Notes (Signed)
PROGRESS NOTE [facility]   Gemini Beaumier Peerless   MPN:[id number] DOB: [birth year]  DOA: 03/14/2018 PCP: Denita Lung, MD   Brief Narrative:  James [name]  is a 62 y.o.malepast medical history recurrent severe anemia requiring multiple transfusions due to AVMs with chronic blood loss, alcohol abuse who came in for weakness, near syncope and was found to have a hemoglobin of 2. He was noted to be confused in the ED with Temp of 91.2, BP 92/54, WBC 43.000. He was admitted for blood transfusions, started on IV antibiotic cefepime. Due to diarrhea, C Diff was checked which returned positive. He was started on PO vanco via NGT. Due to acute encephalopathy, MRI was completed which revealed bilateral subdural hematomas vs intracranial hypotension vs pachymeningitis. Neurology was consulted and they have recommended LP, which has not been completed due to patient's agitation. Hospitalization complicated due to patient's altered mentation, aspiration risk, poor PO intake.   Subjective: Patient seen and examined, he is alert but very confused.  Unable to follow instructions well.  No acute events overnight.  IR was unable to place PEG tube.   Assessment & Plan: Symptomatic anemia Initially presented with hemoglobin of 2, history of AVM.  FOBT was negative.  Patient has been transfused a total of 6 units of PRBCs.  Hemoglobin remained stable at this time.  Monitor CBC  Sepsis due to C. difficile colitis Sepsis physiology has resolved, patient finished oral vancomycin course  Acute toxic encephalopathy Unclear etiology, initial thought to be delirium due to alcohol withdrawal however did not significantly improve after withdrawal window.  MRI was completed on 7/11 which revealed diffuse smooth dural thickening over the cerebral convexities.  No significant mass-effect.  Findings probably represent subdural hematoma or intracranial hypotension.  Neurology was consulted and LP was recommended however  unable to complete due to lack of cooperation/agitation.  Ammonia levels were normal.  Neurology on 7/18 recommended to hold on LP as patient has some clinical improvement and could consider repeating CT head without contrast or MRI if clinical changes.  Patient remains waxing and waning, however very confused on my exam.  Attempted to discuss with family but no answer.  Severe protein calorie malnutrition/dysphagia and aspiration risk Plan was to place PEG tube per IR, however unable to place due to ascites, his stoma is seen posterior his distal transverse colon and splenic flexure and with history of gastric AVM and portal gastropathy there is increased risk of bleeding.  Recommendation to get a gastrostomy tube by general surgery.  I will discuss this with family and palliative care as patient does not seems to have significant improvement over the course of 10+ days in the hospital.  Awaiting for family response to discuss next treatment options.  For now continue NG feedings, given confusion patient has  pulled out his NG tube numerous times, this is a concern for a PEG tube placement.  Tobacco abuse Nicotine patch   Alcohol abuse with withdrawal Out of window for alcohol withdrawal.  Hepatomegaly with thrombocytopenia Secondary to alcohol abuse  Hypokalemia/hypomagnesemia Replete as needed check BMP and mag in a.m.  DVT prophylaxis: SCDs Code Status: Full code Family Communication: No family at bedside Disposition Plan: At this point SNF when stable and  PEG tube in place.  Consultants:   IR   Palliative   Neurology   Antimicrobials: Anti-infectives (From admission, onward)   Start     Dose/Rate Route Frequency Ordered Stop   03/22/18 1600  metroNIDAZOLE (FLAGYL)  tablet 500 mg  Status:  Discontinued     500 mg Oral Every 8 hours 03/22/18 1325 03/23/18 0809   03/21/18 0000  vancomycin (VANCOCIN) 50 mg/mL oral solution 125 mg     125 mg Oral Every 6 hours 03/20/18 2136  03/28/18 2359   03/21/18 0000  vancomycin (VANCOCIN) 500 mg in sodium chloride irrigation 0.9 % 100 mL ENEMA     500 mg Rectal Every 6 hours 03/20/18 2136 03/28/18 2359   03/19/18 2000  vancomycin (VANCOCIN) 500 mg in sodium chloride irrigation 0.9 % 100 mL ENEMA  Status:  Discontinued     500 mg Rectal Every 6 hours 03/19/18 1626 03/20/18 2136   03/19/18 1630  metroNIDAZOLE (FLAGYL) IVPB 500 mg  Status:  Discontinued     500 mg 100 mL/hr over 60 Minutes Intravenous Every 8 hours 03/19/18 1626 03/22/18 1325   03/17/18 1000  vancomycin (VANCOCIN) 50 mg/mL oral solution 125 mg  Status:  Discontinued     125 mg Oral 4 times daily 03/17/18 0739 03/20/18 2136   03/15/18 0800  ceFEPIme (MAXIPIME) 1 g in sodium chloride 0.9 % 100 mL IVPB  Status:  Discontinued     1 g 200 mL/hr over 30 Minutes Intravenous Every 8 hours 03/15/18 0603 03/17/18 0737   03/15/18 0130  ceFEPIme (MAXIPIME) 2 g in sodium chloride 0.9 % 100 mL IVPB     2 g 200 mL/hr over 30 Minutes Intravenous  Once 03/15/18 0117 03/15/18 0220          Objective: Vitals:   03/30/18 1506 03/30/18 2022 03/31/18 0536 03/31/18 1341  BP: 106/61 104/75 107/67 101/64  Pulse: 94 90 79 90  Resp: 16 20 16 17   Temp: 97.9 F (36.6 C)  97.9 F (36.6 C) 97.9 F (36.6 C)  TempSrc: Oral  Axillary Oral  SpO2: 100% 100% 91% 92%  Weight:   49.6 kg (109 lb 5.6 oz)   Height:        Intake/Output Summary (Last 24 hours) at 03/31/2018 1428 Last data filed at 03/31/2018 0543 Gross per 24 hour  Intake 2960 ml  Output 550 ml  Net 2410 ml   Filed Weights   03/28/18 0400 03/30/18 0233 03/31/18 0536  Weight: 52 kg (114 lb 10.2 oz) 49.6 kg (109 lb 5.6 oz) 49.6 kg (109 lb 5.6 oz)    Examination:  General exam: Appears calm and comfortable  Respiratory system: Clear to auscultation. No wheezes,crackle or rhonchi Cardiovascular system: S1 & S2 heard, RRR. No JVD, murmurs, rubs or gallops Gastrointestinal system: Abdomen is nondistended, soft  and nontender. No organomegaly or masses felt. Normal bowel sounds heard. Central nervous system: Patient is alert, however does not follow commands and not answer questions appropriately. Extremities: Symmetric, no LE edema Skin: No rashes, lesions or ulcers   Data Reviewed: I have personally reviewed following labs and imaging studies  CBC: Recent Labs  Lab 03/27/18 0500 03/28/18 0500 03/29/18 0500 03/30/18 0620 03/31/18 0644  WBC 9.3 8.5 8.2 8.2 9.8  HGB 8.4* 8.1* 8.4* 8.3* 8.3*  HCT 26.4* 25.2* 25.5* 25.9* 25.4*  MCV 85.2 84.8 83.9 85.5 84.9  PLT 124* 132* 127* 111* 408*   Basic Metabolic Panel: Recent Labs  Lab 03/27/18 0500 03/28/18 0500 03/29/18 0500 03/30/18 0620 03/31/18 0644  NA 134* 134* 134* 134* 133*  K 2.7* 3.0* 3.2* 3.4* 3.5  CL 108 109 107 107 108  CO2 21* 21* 21* 21* 18*  GLUCOSE 221* 208* 76 80  75  BUN <5* <5* <5* <5* <5*  CREATININE 0.32* <0.30* <0.30* <0.30* <0.30*  CALCIUM 7.2* 7.2* 7.5* 7.5* 7.4*  MG 1.5* 1.6* 1.4* 1.5* 1.6*   GFR: CrCl cannot be calculated (This lab value cannot be used to calculate CrCl because it is not a number: <0.30). Liver Function Tests: No results for input(s): AST, ALT, ALKPHOS, BILITOT, PROT, ALBUMIN in the last 168 hours. No results for input(s): LIPASE, AMYLASE in the last 168 hours. Recent Labs  Lab 03/24/18 1908  AMMONIA 33   Coagulation Profile: No results for input(s): INR, PROTIME in the last 168 hours. Cardiac Enzymes: No results for input(s): CKTOTAL, CKMB, CKMBINDEX, TROPONINI in the last 168 hours. BNP (last 3 results) No results for input(s): PROBNP in the last 8760 hours. HbA1C: No results for input(s): HGBA1C in the last 72 hours. CBG: Recent Labs  Lab 03/30/18 2030 03/31/18 0024 03/31/18 0345 03/31/18 0739 03/31/18 1150  GLUCAP 73 75 70 73 72   Lipid Profile: No results for input(s): CHOL, HDL, LDLCALC, TRIG, CHOLHDL, LDLDIRECT in the last 72 hours. Thyroid Function Tests: No  results for input(s): TSH, T4TOTAL, FREET4, T3FREE, THYROIDAB in the last 72 hours. Anemia Panel: No results for input(s): VITAMINB12, FOLATE, FERRITIN, TIBC, IRON, RETICCTPCT in the last 72 hours. Sepsis Labs: No results for input(s): PROCALCITON, LATICACIDVEN in the last 168 hours.  No results found for this or any previous visit (from the past 240 hour(s)).    Radiology Studies: Ct Abdomen Wo Contrast  Result Date: 03/31/2018 CLINICAL DATA:  Assessment for possible percutaneous gastrostomy tube placement. EXAM: CT ABDOMEN WITHOUT CONTRAST TECHNIQUE: Multidetector CT imaging of the abdomen was performed following the standard protocol without IV contrast. COMPARISON:  CT of the abdomen and pelvis on 09/09/2017 FINDINGS: Lower chest: Lung bases demonstrate small bilateral pleural effusions and bibasilar atelectasis. Hepatobiliary: The liver shows evidence of probable cirrhosis. The gallbladder is mildly distended and contains some layering calcified gallstones. No biliary ductal dilatation identified. Pancreas: Unremarkable. No pancreatic ductal dilatation or surrounding inflammatory changes. Spleen: The spleen is mildly enlarged. Adrenals/Urinary Tract: No adrenal masses. The kidneys demonstrate no evidence of hydronephrosis or visible calculi. Stomach/Bowel: The stomach shows persistent wall thickening, especially involving the proximal to mid stomach. The patient does have a known history of gastric vascular malformations and portal gastropathy. On the current CT, the proximal stomach is posterior to the left lobe of the liver and the mid to distal stomach is posterior to the splenic flexure and distal transverse colon. No free air identified. There are some mildly dilated fluid-filled small bowel loops in the abdomen. Vascular/Lymphatic: No significant vascular findings are present. No enlarged abdominal or pelvic lymph nodes. Other: Small amount of ascites is present around the liver, spleen and  elsewhere in the peritoneal cavity. Musculoskeletal: No acute or significant osseous findings. IMPRESSION: 1. Gastric anatomy is very high risk for percutaneous gastrostomy tube placement due to positioning behind the colon as well as the presence of ascites and history of both portal gastropathy and gastric vascular malformations. Combination of risks is felt to be prohibitive for safe gastrostomy tube placement currently. 2. Evidence of cirrhosis and ascites.  Associated mild splenomegaly. 3. Mildly dilated fluid-filled small bowel in the visualized abdomen may be consistent with component of ileus or enteritis. Electronically Signed   By: Aletta Edouard M.D.   On: 03/31/2018 14:00      Scheduled Meds: . folic acid  1 mg Intravenous Daily  . nicotine  21 mg  Transdermal Daily  . scopolamine  1 patch Transdermal Q72H  . sodium chloride flush  10-40 mL Intracatheter Q12H  . thiamine injection  100 mg Intravenous Daily   Continuous Infusions: . dextrose 5 % and 0.45% NaCl 75 mL/hr at 03/31/18 0714  . feeding supplement (OSMOLITE 1.5 CAL)       LOS: 17 days    Time spent: Total of 25 minutes spent with pt, greater than 50% of which was spent in discussion of  treatment, counseling and coordination of care   Chipper Oman, MD Pager: Text Page via [email or web address]   If 7PM-7AM, please contact night-coverage [email or web address] 03/31/2018, 2:28 PM   Note - This record has been created using [employer]. Chart creation errors have been sought, but may not always have been located. Such creation errors do not reflect on the standard of medical care.

## 2018-03-31 NOTE — Progress Notes (Signed)
Nutrition Follow-up  DOCUMENTATION CODES:   Severe malnutrition in context of chronic illness, Underweight  INTERVENTION:   Once PEG placed: -Initiate Osmolite 1.5 @ 20 ml/hr -Will advance as tolerated to goal rate of 55 ml/hr. -Free water of 200 ml QID.  At goal TF provides: 1980 kcal, 82g protein and 1805 ml H2O.  NUTRITION DIAGNOSIS:   Severe Malnutrition related to chronic illness(ETOH abuse) as evidenced by energy intake < or equal to 75% for > or equal to 1 month, severe fat depletion, severe muscle depletion.  Ongoing  GOAL:   Patient will meet greater than or equal to 90% of their needs  Not meeting PO  MONITOR:   PO intake, Supplement acceptance, Weight trends, Labs, I & O's  REASON FOR ASSESSMENT:   Consult Enteral/tube feeding initiation and management  ASSESSMENT:   62 y.o. 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  He was brought in because of progressive weakness near syncope and confusion   RD consulted for tube feeding management. Pt does not have acess at this time but is scheduled for PEG placement. He is currently NPO. RN reports pt has taken bites of food but not much else. States he often coughs upon eating. SLP saw pt 7/23 and notes "Feeding tube will NOT prevent him from aspirating as he is grossly aspirating secretions.  At this time do not recommend po diet for this pt"   Will attempt trickle feedings once PEG placed. Pt will need to sit upright in bed at all times during continuous feeding. Recommend re-visiting with palliative as pt is at [place] risk for aspiration.   Weight noted to decrease 5 lb since last RD visit 7/22 (114 lb to 109 lb). Will continue to monitor trends.   Monitor magnesium, potassium, and phosphorus daily for at least 3 days, MD to replete as needed, as pt is at  risk for refeeding syndrome given prolonged time with intake and hypomag.  Medications reviewed and include: thiamine, D5 @75  ml/hr Labs reviewed: Na 133 (L) Mg 1.3 (L)  Diet Order:   Diet Order           Diet NPO time specified Except for: Ice Chips  Diet effective now          EDUCATION NEEDS:      Skin:  Skin Assessment: Reviewed RN Assessment  Last BM:  03/30/18  Height:   Ht Readings from Last 1 Encounters:  03/15/18 5\' 10"  (1.778 m)    Weight:   Wt Readings from Last 1 Encounters:  03/31/18 109 lb 5.6 oz (49.6 kg)    Ideal Body Weight:  75.5 kg  BMI:  Body mass index is 15.69 kg/m.  Estimated Nutritional Needs:   Kcal:  1800-2000  Protein:  85-95g  Fluid:  2L/day    Mariana Single RD, LDN Clinical Nutrition Pager # - [phone]

## 2018-04-01 ENCOUNTER — Telehealth: Payer: Self-pay | Admitting: Student

## 2018-04-01 ENCOUNTER — Inpatient Hospital Stay (HOSPITAL_COMMUNITY): Payer: BC Managed Care – PPO

## 2018-04-01 DIAGNOSIS — E43 Unspecified severe protein-calorie malnutrition: Secondary | ICD-10-CM

## 2018-04-01 DIAGNOSIS — G9341 Metabolic encephalopathy: Secondary | ICD-10-CM

## 2018-04-01 DIAGNOSIS — Z9189 Other specified personal risk factors, not elsewhere classified: Secondary | ICD-10-CM

## 2018-04-01 LAB — CBC WITH DIFFERENTIAL/PLATELET
Basophils Absolute: 0.1 10*3/uL (ref 0.0–0.1)
Basophils Relative: 1 %
EOS ABS: 0.2 10*3/uL (ref 0.0–0.7)
Eosinophils Relative: 2 %
HCT: 25.7 % — ABNORMAL LOW (ref 39.0–52.0)
HEMOGLOBIN: 8.5 g/dL — AB (ref 13.0–17.0)
LYMPHS ABS: 1.1 10*3/uL (ref 0.7–4.0)
Lymphocytes Relative: 12 %
MCH: 27.9 pg (ref 26.0–34.0)
MCHC: 33.1 g/dL (ref 30.0–36.0)
MCV: 84.3 fL (ref 78.0–100.0)
MONOS PCT: 12 %
Monocytes Absolute: 1.1 10*3/uL — ABNORMAL HIGH (ref 0.1–1.0)
NEUTROS PCT: 73 %
Neutro Abs: 7.1 10*3/uL (ref 1.7–7.7)
PLATELETS: 114 10*3/uL — AB (ref 150–400)
RBC: 3.05 MIL/uL — ABNORMAL LOW (ref 4.22–5.81)
RDW: 23.4 % — ABNORMAL HIGH (ref 11.5–15.5)
WBC: 9.6 10*3/uL (ref 4.0–10.5)

## 2018-04-01 LAB — BASIC METABOLIC PANEL
Anion gap: 5 (ref 5–15)
CHLORIDE: 109 mmol/L (ref 98–111)
CO2: 21 mmol/L — ABNORMAL LOW (ref 22–32)
Calcium: 7.5 mg/dL — ABNORMAL LOW (ref 8.9–10.3)
Creatinine, Ser: <0.3 mg/dL — ABNORMAL LOW (ref 0.61–1.24)
Glucose, Bld: 75 mg/dL (ref 70–99)
Potassium: 3.2 mmol/L — ABNORMAL LOW (ref 3.5–5.1)
Sodium: 135 mmol/L (ref 135–145)

## 2018-04-01 LAB — GLUCOSE, CAPILLARY
GLUCOSE-CAPILLARY: 136 mg/dL — AB (ref 70–99)
GLUCOSE-CAPILLARY: 157 mg/dL — AB (ref 70–99)
GLUCOSE-CAPILLARY: 59 mg/dL — AB (ref 70–99)
GLUCOSE-CAPILLARY: 68 mg/dL — AB (ref 70–99)
GLUCOSE-CAPILLARY: 68 mg/dL — AB (ref 70–99)
GLUCOSE-CAPILLARY: 73 mg/dL (ref 70–99)
GLUCOSE-CAPILLARY: 99 mg/dL (ref 70–99)
Glucose-Capillary: 81 mg/dL (ref 70–99)
Glucose-Capillary: 111 mg/dL — ABNORMAL HIGH (ref 70–99)
Glucose-Capillary: 55 mg/dL — ABNORMAL LOW (ref 70–99)

## 2018-04-01 LAB — AMMONIA: AMMONIA: 34 umol/L (ref 9–35)

## 2018-04-01 LAB — MAGNESIUM: Magnesium: 1.5 mg/dL — ABNORMAL LOW (ref 1.7–2.4)

## 2018-04-01 MED ORDER — DEXTROSE 50 % IV SOLN
INTRAVENOUS | Status: AC
  Administered 2018-04-01: 50 mL
  Filled 2018-04-01: qty 50

## 2018-04-01 MED ORDER — MAGNESIUM SULFATE 2 GM/50ML IV SOLN
2.0000 g | Freq: Once | INTRAVENOUS | Status: AC
  Administered 2018-04-01: 2 g via INTRAVENOUS
  Filled 2018-04-01: qty 50

## 2018-04-01 MED ORDER — DEXTROSE 50 % IV SOLN
INTRAVENOUS | Status: AC
  Administered 2018-04-01: 20:00:00
  Administered 2018-04-01: 50 mL
  Filled 2018-04-01: qty 50

## 2018-04-01 MED ORDER — DEXTROSE 50 % IV SOLN
INTRAVENOUS | Status: AC
  Administered 2018-04-01: 25 mL
  Filled 2018-04-01: qty 50

## 2018-04-01 MED ORDER — POTASSIUM CHLORIDE 10 MEQ/100ML IV SOLN
10.0000 meq | INTRAVENOUS | Status: AC
  Administered 2018-04-01 (×2): 10 meq via INTRAVENOUS
  Filled 2018-04-01 (×2): qty 100

## 2018-04-01 NOTE — Progress Notes (Signed)
Hypoglycemic Event  CBG: 68  Treatment: D50 IV 25 mL  Symptoms: None  Follow-up CBG: [id number] CBG Result:73  Possible Reasons for Event: Unknown  Comments/MD notified:None    James [name], [place]

## 2018-04-01 NOTE — Progress Notes (Signed)
Daily Progress Note   Patient Name: James [name]       Date: 04/01/2018 DOB: 03/10/1956  Age: 62 y.o. MRN#: [id number] Attending Physician: Patrecia Pour, Christean Grief, MD Primary Care Physician: Denita Lung, MD Admit Date: 03/14/2018  Reason for Consultation/Follow-up: Establishing goals of care  Subjective: Patient drowsy and not participatory in conversation. Audible secretions. No s/s of distress.   Briefly met with wife, [name] at bedside. Unfortunately unable to have a valuable goals of care conversation because she needed to leave for work. Discussed course of hospitalization including diagnoses and interventions.   Reviewed results of CT abdomen and risks with placing feeding tube through IR and surgery. [name] agrees that it would be too risky to pursue surgery evaluation. She is not ready to accept that IR cannot place feeding tube stating "can't they try a different part of the stomach?" I explained that I would reach out to IR provider to call her if possible to explain their expert opinion of why PEG tube is not indicated/high risk. [name] agreeable to this.  [name] speaks of not being ready to give up on him. She shares her frustrations that he has continued to drink and smoke despite being educated and encouraged to stop. She has a small hope that she could get him home, eating, and stop all drinking. She does acknowledge that "he has a body of an 62 year old" and "knocking on deaths door." She is not ready to "ship him to hospice" and hoping there is still a chance for feeding tube to be placed.   [name] had to leave for work and unable to have a detailed discussion regarding limitations to care including resuscitation.   Length of Stay: 18  Current Medications: Scheduled Meds:    . folic acid  1 mg Intravenous Daily  . nicotine  21 mg Transdermal Daily  . scopolamine  1 patch Transdermal Q72H  . sodium chloride flush  10-40 mL Intracatheter Q12H  . thiamine injection  100 mg Intravenous Daily    Continuous Infusions: . dextrose 5 % and 0.45% NaCl 75 mL/hr at 04/01/18 0437  . feeding supplement (OSMOLITE 1.5 CAL)      PRN Meds: albuterol, fluticasone, LORazepam, [DISCONTINUED] ondansetron **OR** ondansetron (ZOFRAN) IV, sodium chloride flush  Physical Exam  Constitutional: He is easily aroused.  He appears ill.  HENT:  Head: Normocephalic and atraumatic.  Pulmonary/Chest: No accessory muscle usage. No tachypnea. No respiratory distress.  Audible secretions  Neurological: He is easily aroused.  Drowsy  Skin: Skin is warm and dry.  Psychiatric: His speech is delayed. He is inattentive.  Nursing note and vitals reviewed.          Vital Signs: BP 105/78 (BP Location: Left Arm)   Pulse 92   Temp (!) 97.3 F (36.3 C) (Oral)   Resp 20   Ht _0  (1.778 m)   Wt 48.7 kg (107 lb 5.8 oz)   SpO2 98%   BMI 15.41 kg/m  SpO2: SpO2: 98 % O2 Device: O2 Device: Room Air O2 Flow Rate: O2 Flow Rate (L/min): 2 L/min  Intake/output summary:   Intake/Output Summary (Last 24 hours) at 04/01/2018 3382 Last data filed at 03/31/2018 1757 Gross per 24 hour  Intake -  Output 900 ml  Net -900 ml   LBM: Last BM Date: (P) 04/01/18 Baseline Weight: Weight: 45.9 kg (101 lb 3.1 oz) Most recent weight: Weight: 48.7 kg (107 lb 5.8 oz)       Palliative Assessment/Data: PPS 30%   Flowsheet Rows     Most Recent Value  Intake Tab  Referral Department  Hospitalist  Unit at Time of Referral  Oncology Unit  Date Notified  03/25/18  Palliative Care Type  New Palliative care  Reason for referral  Clarify Goals of Care  Date of Admission  03/14/18  Date first seen by Palliative Care  03/27/18  # of days Palliative referral response time  2 Day(s)  # of days IP prior to  Palliative referral  11  Clinical Assessment  Psychosocial & Spiritual Assessment  Palliative Care Outcomes      Patient Active Problem List   Diagnosis Date Noted  . Palliative care by specialist   . Goals of care, counseling/discussion   . C. difficile colitis   . Altered mental status   . Dysphagia   . Thrombocytopenia ([place]) 03/16/2018  . Acute metabolic encephalopathy 50/53/9767  . Watery diarrhea 03/16/2018  . Microcytic anemia 09/10/2017  . Acute respiratory failure with hypoxia ([place])   . Hypokalemia 03/02/2017  . Symptomatic anemia 03/02/2017  . Hyponatremia 03/02/2017  . Right hip pain 03/02/2017  . Lactic acidosis   . History of peptic ulcer disease 10/06/2016  . Poor dentition 10/06/2016  . Iron deficiency anemia due to chronic blood loss 09/17/2016  . Alcoholic gastritis with hemorrhage   . Marijuana abuse   . Severe protein-calorie malnutrition ([place]) 01/02/2015  . ILD (interstitial lung disease) ([place]) 10/24/2011  . Pulmonary nodule 10/24/2011  . Cachexia ([place]) 10/24/2011  . Benign neoplasm of colon 10/03/2011  . Symptomatic Anemia  10/01/2011  . Tobacco abuse 10/01/2011  . Alcohol abuse 10/01/2011  . Dysphagia, unspecified(787.20) 10/01/2011    Palliative Care Assessment & Plan   Patient Profile: 62 y.o. male  with past medical history of recurrent severe anemia (hx AVM) alcohol abuse admitted on 03/14/2018 with weakness and low Hgb and found to have sepsis from c-diff colitis.  Continues to have acute encephalopathy.  Concern for aspiration as well.  Palliative consulted for goals of care.     Assessment: Acute toxic encephalopathy Sepsis secondary to cdiff colitis Symptomatic anemia ETOH abuse Hepatomegaly Coagulopathy Thrombocytopenia Severe protein calorie malnutrition Dysphagia  Recommendations/Plan:  Continue FULL code/FULL scope. IR APP to call wife today to give expert opinion on why he is  not a candidate for IR PEG tube placement.    Understanding high risk for surgery, wife does confirm that she would NOT pursue feeding tube placement by surgical team.   Wife is not ready to "give up on him" and "ship him to hospice."   Will need further goals of care decisions. PMT to follow. Wife works during the week and difficult to meet with [place] during PMT hours. Wife will be available over the weekend.   Goals of Care and Additional Recommendations:  Limitations on Scope of Treatment: Full Scope Treatment  Code Status: FULL   Code Status Orders  (From admission, onward)        Start     Ordered   03/14/18 2253  Full code  Continuous     03/14/18 2252    Code Status History    Date Active Date Inactive Code Status Order ID Comments User Context   09/10/2017 0102 09/14/2017 1959 Full Code [id number]  Arnell Asal, NP ED   03/02/2017 2314 03/04/2017 2156 Full Code [id number]  Ivor Costa, MD ED   09/17/2016 2152 09/21/2016 1818 Full Code [id number]  Etta Quill, DO ED   08/23/2015 1006 08/26/2015 1701 Full Code [id number]  Bonnielee Haff, MD Inpatient   08/21/2015 0513 08/21/2015 0540 Full Code [id number]  Ivor Costa, MD ED   01/01/2015 2218 01/05/2015 2232 Full Code [id number]  Deneise Lever, MD ED       Prognosis:   Unable to determine: poor prognosis with adult failure to thrive secondary to severe protein calorie malnutrition, high aspiration risk with copious secretions, recurrent anemia, AVM's, ETOH abuse. High risk for decompensation.  Discharge Planning:  To Be Determined  Care plan was discussed with patient, wife, RN, Dr. Eyvonne Mechanic, IR PA  Thank you for allowing the Palliative Medicine Team to assist in the care of this patient.   Time In: 0740 Time Out: 0815 Total Time 60mn Prolonged Time Billed no      Greater than 50%  of this time was spent counseling and coordinating care related to the above assessment and plan.  MIhor Dow FNP-C Palliative Medicine Team  Phone: [phone]:  [phone] Please contact Palliative Medicine Team phone at [phone] questions and concerns.

## 2018-04-01 NOTE — Progress Notes (Signed)
PROGRESS NOTE [facility]   Jeston Junkins [place]   LPF:[id number] DOB: [birth year]  DOA: 03/14/2018 PCP: Denita Lung, MD   Brief Narrative:  James [name]  is a 62 y.o.malepast medical history recurrent severe anemia requiring multiple transfusions due to AVMs with chronic blood loss, alcohol abuse who came in for weakness, near syncope and was found to have a hemoglobin of 2. He was noted to be confused in the ED with Temp of 91.2, BP 92/54, WBC 43.000. He was admitted for blood transfusions, started on IV antibiotic cefepime. Due to diarrhea, C Diff was checked which returned positive. He was started on PO vanco via NGT. Due to acute encephalopathy, MRI was completed which revealed bilateral subdural hematomas vs intracranial hypotension vs pachymeningitis. Neurology was consulted and they have recommended LP, which has not been completed due to patient's agitation. Hospitalization complicated due to patient's altered mentation, aspiration risk, poor PO intake.   Subjective: Patient seen and examined, he is more lethargic today, however response to verbal commands   Assessment & Plan: Symptomatic anemia - stable  Initially presented with hemoglobin of 2, history of AVM.  FOBT was negative.  Patient has been transfused a total of 6 units of PRBCs.  Hemoglobin remained stable at this time.  Monitor CBC  Sepsis due to C. difficile colitis Sepsis physiology has resolved, patient finished oral vancomycin course  Acute toxic encephalopathy - remains lethargic  Unclear etiology, initial thought to be delirium due to alcohol withdrawal however did not significantly improve after withdrawal window.  MRI was completed on 7/11 which revealed diffuse smooth dural thickening over the cerebral convexities.  No significant mass-effect.  Findings probably represent subdural hematoma or intracranial hypotension.  Neurology was consulted and LP was recommended however unable to complete due to lack of  cooperation/agitation.  Ammonia levels borderline up, patient jaundice.  Neurology on 7/18 recommended to hold on LP as patient has some clinical improvement. Also could consider repeating CT head without contrast or MRI if clinical changes. Will repeat CT head today. Will discuss with family regarding transition to hospice if CT head does not show any acute process.   Severe protein calorie malnutrition/dysphagia and aspiration risk Plan was to place PEG tube per IR, however unable to place due to ascites, his stoma is seen posterior his distal transverse colon and splenic flexure and with history of gastric AVM and portal gastropathy there is increased risk of bleeding.  Recommendation to get a gastrostomy tube by general surgery.  Case discussed with wife at home at this time will not wish to pursue any open surgery, therefore will hold on general surgery evaluation.  Patient was confused as why this was not able to be placed, IR has explained in details and appreciate their help.  Wife will discuss with family if hospice care will be the next step.  Patient without NG tube as he pulled it out and not receiving nutrition at this time  Tobacco abuse Nicotine patch   Alcohol abuse with withdrawal Out of window for alcohol withdrawal.  Hepatomegaly with thrombocytopenia Secondary to alcohol abuse  Hypokalemia/hypomagnesemia Repleted via IV, check BMP and Mag in AM   DVT prophylaxis: SCDs Code Status: Full code Family Communication: No family at bedside Disposition Plan: Unclear at this time   Consultants:   IR   Palliative   Neurology   Antimicrobials: Anti-infectives (From admission, onward)   Start     Dose/Rate Route Frequency Ordered Stop   03/22/18 1600  metroNIDAZOLE (FLAGYL) tablet 500 mg  Status:  Discontinued     500 mg Oral Every 8 hours 03/22/18 1325 03/23/18 0809   03/21/18 0000  vancomycin (VANCOCIN) 50 mg/mL oral solution 125 mg     125 mg Oral Every 6 hours 03/20/18  2136 03/28/18 2359   03/21/18 0000  vancomycin (VANCOCIN) 500 mg in sodium chloride irrigation 0.9 % 100 mL ENEMA     500 mg Rectal Every 6 hours 03/20/18 2136 03/28/18 2359   03/19/18 2000  vancomycin (VANCOCIN) 500 mg in sodium chloride irrigation 0.9 % 100 mL ENEMA  Status:  Discontinued     500 mg Rectal Every 6 hours 03/19/18 1626 03/20/18 2136   03/19/18 1630  metroNIDAZOLE (FLAGYL) IVPB 500 mg  Status:  Discontinued     500 mg 100 mL/hr over 60 Minutes Intravenous Every 8 hours 03/19/18 1626 03/22/18 1325   03/17/18 1000  vancomycin (VANCOCIN) 50 mg/mL oral solution 125 mg  Status:  Discontinued     125 mg Oral 4 times daily 03/17/18 0739 03/20/18 2136   03/15/18 0800  ceFEPIme (MAXIPIME) 1 g in sodium chloride 0.9 % 100 mL IVPB  Status:  Discontinued     1 g 200 mL/hr over 30 Minutes Intravenous Every 8 hours 03/15/18 0603 03/17/18 0737   03/15/18 0130  ceFEPIme (MAXIPIME) 2 g in sodium chloride 0.9 % 100 mL IVPB     2 g 200 mL/hr over 30 Minutes Intravenous  Once 03/15/18 0117 03/15/18 0220         Objective: Vitals:   03/31/18 2240 04/01/18 0017 04/01/18 0249 04/01/18 0644  BP: 119/64 (!) 141/90  105/78  Pulse: (!) 102 (!) 104  92  Resp: 20 (!) 24  20  Temp: 97.8 F (36.6 C)   (!) 97.3 F (36.3 C)  TempSrc: Oral   Oral  SpO2: 94% 99%  98%  Weight:   48.7 kg (107 lb 5.8 oz)   Height:        Intake/Output Summary (Last 24 hours) at 04/01/2018 1545 Last data filed at 04/01/2018 1312 Gross per 24 hour  Intake 843 ml  Output 900 ml  Net -57 ml   Filed Weights   03/30/18 0233 03/31/18 0536 04/01/18 0249  Weight: 49.6 kg (109 lb 5.6 oz) 49.6 kg (109 lb 5.6 oz) 48.7 kg (107 lb 5.8 oz)    Examination:  General: Lethargic, minimally responsive  HEENT: Scleral icteric  Cardiovascular: RRR, S1/S2  Respiratory: CTA  Abdominal: Soft, NT, ND, bowel sounds + Extremities: no edema, no cyanosis Neurology: Lethargic, slow response to verbal stimuli, follow simple  commands    Data Reviewed: I have personally reviewed following labs and imaging studies  CBC: Recent Labs  Lab 03/28/18 0500 03/29/18 0500 03/30/18 0620 03/31/18 0644 04/01/18 0437  WBC 8.5 8.2 8.2 9.8 9.6  NEUTROABS  --   --   --   --  7.1  HGB 8.1* 8.4* 8.3* 8.3* 8.5*  HCT 25.2* 25.5* 25.9* 25.4* 25.7*  MCV 84.8 83.9 85.5 84.9 84.3  PLT 132* 127* 111* 115* 427*   Basic Metabolic Panel: Recent Labs  Lab 03/28/18 0500 03/29/18 0500 03/30/18 0620 03/31/18 0644 04/01/18 0437  NA 134* 134* 134* 133* 135  K 3.0* 3.2* 3.4* 3.5 3.2*  CL 109 107 107 108 109  CO2 21* 21* 21* 18* 21*  GLUCOSE 208* 76 80 75 75  BUN <5* <5* <5* <5* <5*  CREATININE <0.30* <0.30* <0.30* <0.30* <0.30*  CALCIUM 7.2* 7.5* 7.5* 7.4* 7.5*  MG 1.6* 1.4* 1.5* 1.6* 1.5*   GFR: CrCl cannot be calculated (This lab value cannot be used to calculate CrCl because it is not a number: <0.30). Liver Function Tests: No results for input(s): AST, ALT, ALKPHOS, BILITOT, PROT, ALBUMIN in the last 168 hours. No results for input(s): LIPASE, AMYLASE in the last 168 hours. Recent Labs  Lab 04/01/18 1112  AMMONIA 34   Coagulation Profile: No results for input(s): INR, PROTIME in the last 168 hours. Cardiac Enzymes: No results for input(s): CKTOTAL, CKMB, CKMBINDEX, TROPONINI in the last 168 hours. BNP (last 3 results) No results for input(s): PROBNP in the last 8760 hours. HbA1C: No results for input(s): HGBA1C in the last 72 hours. CBG: Recent Labs  Lab 04/01/18 0433 04/01/18 0641 04/01/18 0728 04/01/18 0811 04/01/18 1146  GLUCAP 68* 73 68* 157* 99   Lipid Profile: No results for input(s): CHOL, HDL, LDLCALC, TRIG, CHOLHDL, LDLDIRECT in the last 72 hours. Thyroid Function Tests: No results for input(s): TSH, T4TOTAL, FREET4, T3FREE, THYROIDAB in the last 72 hours. Anemia Panel: No results for input(s): VITAMINB12, FOLATE, FERRITIN, TIBC, IRON, RETICCTPCT in the last 72 hours. Sepsis Labs: No  results for input(s): PROCALCITON, LATICACIDVEN in the last 168 hours.  No results found for this or any previous visit (from the past 240 hour(s)).    Radiology Studies: Ct Abdomen Wo Contrast  Result Date: 03/31/2018 CLINICAL DATA:  Assessment for possible percutaneous gastrostomy tube placement. EXAM: CT ABDOMEN WITHOUT CONTRAST TECHNIQUE: Multidetector CT imaging of the abdomen was performed following the standard protocol without IV contrast. COMPARISON:  CT of the abdomen and pelvis on 09/09/2017 FINDINGS: Lower chest: Lung bases demonstrate small bilateral pleural effusions and bibasilar atelectasis. Hepatobiliary: The liver shows evidence of probable cirrhosis. The gallbladder is mildly distended and contains some layering calcified gallstones. No biliary ductal dilatation identified. Pancreas: Unremarkable. No pancreatic ductal dilatation or surrounding inflammatory changes. Spleen: The spleen is mildly enlarged. Adrenals/Urinary Tract: No adrenal masses. The kidneys demonstrate no evidence of hydronephrosis or visible calculi. Stomach/Bowel: The stomach shows persistent wall thickening, especially involving the proximal to mid stomach. The patient does have a known history of gastric vascular malformations and portal gastropathy. On the current CT, the proximal stomach is posterior to the left lobe of the liver and the mid to distal stomach is posterior to the splenic flexure and distal transverse colon. No free air identified. There are some mildly dilated fluid-filled small bowel loops in the abdomen. Vascular/Lymphatic: No significant vascular findings are present. No enlarged abdominal or pelvic lymph nodes. Other: Small amount of ascites is present around the liver, spleen and elsewhere in the peritoneal cavity. Musculoskeletal: No acute or significant osseous findings. IMPRESSION: 1. Gastric anatomy is very high risk for percutaneous gastrostomy tube placement due to positioning behind the  colon as well as the presence of ascites and history of both portal gastropathy and gastric vascular malformations. Combination of risks is felt to be prohibitive for safe gastrostomy tube placement currently. 2. Evidence of cirrhosis and ascites.  Associated mild splenomegaly. 3. Mildly dilated fluid-filled small bowel in the visualized abdomen may be consistent with component of ileus or enteritis. Electronically Signed   By: Aletta Edouard M.D.   On: 03/31/2018 14:00      Scheduled Meds: . folic acid  1 mg Intravenous Daily  . nicotine  21 mg Transdermal Daily  . scopolamine  1 patch Transdermal Q72H  . sodium chloride flush  10-40 mL Intracatheter Q12H  . thiamine injection  100 mg Intravenous Daily   Continuous Infusions: . dextrose 5 % and 0.45% NaCl 75 mL/hr at 04/01/18 0437  . feeding supplement (OSMOLITE 1.5 CAL)       LOS: 18 days    Time spent: Total of 25 minutes spent with pt, greater than 50% of which was spent in discussion of  treatment, counseling and coordination of care   Chipper Oman, MD Pager: Text Page via [email or web address]   If 7PM-7AM, please contact night-coverage [email or web address] 04/01/2018, 3:45 PM   Note - This record has been created using [employer]. Chart creation errors have been sought, but may not always have been located. Such creation errors do not reflect on the standard of medical care.

## 2018-04-01 NOTE — Progress Notes (Signed)
Called by RN, Pt nasotracheal suctioned via right nare for copious amount of thick tan secretions.

## 2018-04-01 NOTE — Progress Notes (Signed)
Hypoglycemic Event  CBG: 61  Treatment: D50 IV 50 mL  Symptoms: None  Follow-up CBG: Time:0236 CBG Result:81  Possible Reasons for Event: Unknown  Comments/MD notified:none    James [name], [place]

## 2018-04-01 NOTE — Progress Notes (Signed)
Hypoglycemic Event  CBG: 68  Treatment: D50 IV 50 mL  Symptoms: None  Follow-up CBG: CJAR:0110 CBG Result:157  Possible Reasons for Event: Inadequate meal intake  Comments/MD notified:Will notify MD    Jyl Heinz

## 2018-04-01 NOTE — Progress Notes (Signed)
Hypoglycemic Event  CBG: 55  Treatment: 25 mg D50 IV  Symptoms: asymptomatic  Follow-up CBG: Time:2052 CBG Result:136  Possible Reasons for Event:  Inadequate meal intake  Comments/MD notified: MD notified    James [name]

## 2018-04-01 NOTE — Telephone Encounter (Signed)
Received call from Ihor Dow, NP with palliative care.  IR was requested for possible image-guided percutaneous gastrostomy tube. This procedure was denied, as James [name] is not a candidate for procedure. Reasons denied per Gareth Eagle, PA-C note: "James [name] is NOT a candidate for IR placed g-tube due to ascites, his stomach is posterior to his distal transverse colon and splenic flexure, and he has a history of gastric AVM and portal gastropathy, which increases risk of bleeding."  Jinny Blossom states that James [name]'s wife, James [name], has questions regarding James [name]'s denial for procedure. Megan requests I speak with James [name] to further explain reasoning.  Spoke with James [name]'s wife, James [name], and informed her of reasons listed above. James [name] states that she now better understands the reasons, and will explain them to her family.  All questions answered and concerns addressed. James [name] conveys understanding and agrees with plan.  Bea Graff Avyn Coate, PA-C 04/01/2018, 3:09 PM

## 2018-04-01 NOTE — Progress Notes (Signed)
Hypoglycemic Event  CBG: 24  Treatment: D50 IV 50 mL  Symptoms: None  Follow-up CBG: [id number] CBG Result:143  Possible Reasons for Event: Inadequate meal intake  Comments/MD notified:Dr. Maylene Roes on department and notified     Wilkie Aye [facility]

## 2018-04-02 LAB — CBC
HEMATOCRIT: 24.6 % — AB (ref 39.0–52.0)
Hemoglobin: 8.1 g/dL — ABNORMAL LOW (ref 13.0–17.0)
MCH: 27.9 pg (ref 26.0–34.0)
MCHC: 32.9 g/dL (ref 30.0–36.0)
MCV: 84.8 fL (ref 78.0–100.0)
Platelets: 134 10*3/uL — ABNORMAL LOW (ref 150–400)
RBC: 2.9 MIL/uL — ABNORMAL LOW (ref 4.22–5.81)
RDW: 22.8 % — AB (ref 11.5–15.5)
WBC: 9.2 10*3/uL (ref 4.0–10.5)

## 2018-04-02 LAB — COMPREHENSIVE METABOLIC PANEL
ALBUMIN: 1.4 g/dL — AB (ref 3.5–5.0)
ALT: 23 U/L (ref 0–44)
AST: 37 U/L (ref 15–41)
Alkaline Phosphatase: 93 U/L (ref 38–126)
Anion gap: 3 — ABNORMAL LOW (ref 5–15)
CHLORIDE: 109 mmol/L (ref 98–111)
CO2: 21 mmol/L — ABNORMAL LOW (ref 22–32)
Calcium: 7.1 mg/dL — ABNORMAL LOW (ref 8.9–10.3)
Creatinine, Ser: 0.3 mg/dL — ABNORMAL LOW (ref 0.61–1.24)
GFR calc Af Amer: >60 mL/min (ref >60)
GLUCOSE: 81 mg/dL (ref 70–99)
POTASSIUM: 2.9 mmol/L — AB (ref 3.5–5.1)
Sodium: 133 mmol/L — ABNORMAL LOW (ref 135–145)
Total Bilirubin: 6.9 mg/dL — ABNORMAL HIGH (ref 0.3–1.2)
Total Protein: 4.6 g/dL — ABNORMAL LOW (ref 6.5–8.1)

## 2018-04-02 LAB — GLUCOSE, CAPILLARY
GLUCOSE-CAPILLARY: 71 mg/dL (ref 70–99)
GLUCOSE-CAPILLARY: 72 mg/dL (ref 70–99)
GLUCOSE-CAPILLARY: 77 mg/dL (ref 70–99)
GLUCOSE-CAPILLARY: 82 mg/dL (ref 70–99)
Glucose-Capillary: 67 mg/dL — ABNORMAL LOW (ref 70–99)
Glucose-Capillary: 118 mg/dL — ABNORMAL HIGH (ref 70–99)

## 2018-04-02 LAB — AMMONIA: AMMONIA: 44 umol/L — AB (ref 9–35)

## 2018-04-02 MED ORDER — DEXTROSE 50 % IV SOLN
INTRAVENOUS | Status: AC
  Administered 2018-04-02: 50 mL
  Filled 2018-04-02: qty 50

## 2018-04-02 NOTE — Progress Notes (Signed)
Daily Progress Note   Patient Name: James [name]       Date: 04/02/2018 DOB: December 30, 1955  Age: 62 y.o. MRN#: [id number] Attending Physician: Patrecia Pour, Christean Grief, MD Primary Care Physician: Denita Lung, MD Admit Date: 03/14/2018  Reason for Consultation/Follow-up: Establishing goals of care  Subjective: Patient is awake but not alert, not oriented. He is unable to participate in conversations, he has his legs dangling off the side of the bed.   See below:  Length of Stay: 19  Current Medications: Scheduled Meds:  . folic acid  1 mg Intravenous Daily  . nicotine  21 mg Transdermal Daily  . scopolamine  1 patch Transdermal Q72H  . sodium chloride flush  10-40 mL Intracatheter Q12H  . thiamine injection  100 mg Intravenous Daily    Continuous Infusions: . dextrose 5 % and 0.45% NaCl 75 mL/hr at 04/02/18 0718  . feeding supplement (OSMOLITE 1.5 CAL)      PRN Meds: albuterol, fluticasone, LORazepam, [DISCONTINUED] ondansetron **OR** ondansetron (ZOFRAN) IV, sodium chloride flush  Physical Exam  Constitutional: He is easily aroused. He appears ill.  HENT:  Head: Normocephalic and atraumatic.  Pulmonary/Chest: No accessory muscle usage. No tachypnea. No respiratory distress.  Audible secretions  Neurological: He is easily aroused.  Drowsy  Skin: Skin is warm and dry.  Psychiatric: His speech is delayed. He is inattentive.  Nursing note and vitals reviewed.          Vital Signs: BP (!) 98/51 (BP Location: Left Arm)   Pulse (!) 102   Temp 98 F (36.7 C) (Oral)   Resp 20   Ht 5\' 10"  (1.778 m)   Wt 50.8 kg (111 lb 15.9 oz)   SpO2 97%   BMI 16.07 kg/m  SpO2: SpO2: 97 % O2 Device: O2 Device: Room Air O2 Flow Rate: O2 Flow Rate (L/min): 2 L/min  Intake/output summary:     Intake/Output Summary (Last 24 hours) at 04/02/2018 1107 Last data filed at 04/02/2018 0919 Gross per 24 hour  Intake 843 ml  Output 700 ml  Net 143 ml   LBM: Last BM Date: 04/01/18 Baseline Weight: Weight: 45.9 kg (101 lb 3.1 oz) Most recent weight: Weight: 50.8 kg (111 lb 15.9 oz)       Palliative Assessment/Data: PPS 30%   Flowsheet Rows  Most Recent Value  Intake Tab  Referral Department  Hospitalist  Unit at Time of Referral  Oncology Unit  Date Notified  03/25/18  Palliative Care Type  New Palliative care  Reason for referral  Clarify Goals of Care  Date of Admission  03/14/18  Date first seen by Palliative Care  03/27/18  # of days Palliative referral response time  2 Day(s)  # of days IP prior to Palliative referral  11  Clinical Assessment  Psychosocial & Spiritual Assessment  Palliative Care Outcomes      Patient Active Problem List   Diagnosis Date Noted  . At risk for aspiration   . Palliative care by specialist   . Goals of care, counseling/discussion   . C. difficile colitis   . Altered mental status   . Dysphagia   . Thrombocytopenia ([place]) 03/16/2018  . Acute metabolic encephalopathy 31/51/7616  . Watery diarrhea 03/16/2018  . Microcytic anemia 09/10/2017  . Acute respiratory failure with hypoxia ([place])   . Hypokalemia 03/02/2017  . Symptomatic anemia 03/02/2017  . Hyponatremia 03/02/2017  . Right hip pain 03/02/2017  . Lactic acidosis   . History of peptic ulcer disease 10/06/2016  . Poor dentition 10/06/2016  . Iron deficiency anemia due to chronic blood loss 09/17/2016  . Alcoholic gastritis with hemorrhage   . Marijuana abuse   . Severe protein-calorie malnutrition ([place]) 01/02/2015  . ILD (interstitial lung disease) ([place]) 10/24/2011  . Pulmonary nodule 10/24/2011  . Cachexia ([place]) 10/24/2011  . Benign neoplasm of colon 10/03/2011  . Symptomatic Anemia  10/01/2011  . Tobacco abuse 10/01/2011  . Alcohol abuse 10/01/2011  .  Dysphagia, unspecified(787.20) 10/01/2011    Palliative Care Assessment & Plan   Patient Profile: 62 y.o. male  with past medical history of recurrent severe anemia (hx AVM) alcohol abuse admitted on 03/14/2018 with weakness and low Hgb and found to have sepsis from c-diff colitis.  Continues to have acute encephalopathy.  Concern for aspiration as well.  Palliative consulted for goals of care.     Assessment: Acute toxic encephalopathy Sepsis secondary to cdiff colitis Symptomatic anemia ETOH abuse Hepatomegaly Coagulopathy Thrombocytopenia Severe protein calorie malnutrition Dysphagia  Recommendations/Plan:  I received a call from patient's wife [name] this morning. She requested that she be given "all the facts, without any sugar coating." Wife stated that, she has observed the patient be more lethargic, more confused, more not himself for the past 2 days here at the hospital. She tells me that she is worried that he might never get better and asks what ought to be done if he is "shutting down."  I reviewed over the phone with Mrs [name] as well as the patient's son about the serious acute and underlying illnesses the patient is experiencing. We discussed about offering him a mode of care that will focus exclusively on his comfort, on avoiding suffering, regardless of how short or long his prognosis is at this point.   Issues specific to artificial nutrition and hydration addressed in detail. We reviewed that there is usually no thirst or hunger as patient's appear to enter into the dying stages. Wife is thankful for the information she has received from IR about why the patient is not a candidate for IR placed G tube. She doesn't want him to undergo surgery.   I then brought up hospice philosophy of care, reviewed with her in detail about they type of care that can be provided in a residential hospice setting. She is  agreeable.   She asks me for life expectancy, discussed frankly and  compassionately with her that the patient has a limited prognosis, likely less than 2 weeks at this point. Re iterated to her about focusing on making sure the patient is kept as comfortable as possible, regardless of the duration. She is agreeable.   Wife asks for a letter from the hospital stating the patient is seriously ill. She tells me that she is trying to get his affairs in order. She is trying to contact the [place], she is trying to provide his correct address to various places.  A letter to this effect has been prepared, a copy has been placed on the patient's rider chart, she knows to ask for this letter when she arrives at the hospital later this evening.   She also asks me if she ought to inform the family that he is seriously ill and not going to recover. drowsy and not participatory in conversation. I concur with her, she will contact extended family today.   We gave the patient's son a chance to voice his concerns, or ask further questions, over the phone, he denied having any questions.   Wife states that she is beginning to look into funeral arrangements for the patient. I offered her supportive care and active listening. Encouraged self care for her. She admitted that she has been very stressed and hasn't been able to focus on herself.    PLAN: Wife now agreeable to comfort measures and hospice philosophy of care, CSW consulted to help facilitate residential hospice transfer. Discussed with [facility] MD Dr Quincy Simmonds.   Code Status: FULL   Code Status Orders  (From admission, onward)        Start     Ordered   03/14/18 2253  Full code  Continuous     03/14/18 2252    Code Status History    Date Active Date Inactive Code Status Order ID Comments User Context   09/10/2017 0102 09/14/2017 1959 Full Code [id number]  Arnell Asal, NP ED   03/02/2017 2314 03/04/2017 2156 Full Code [id number]  Ivor Costa, MD ED   09/17/2016 2152 09/21/2016 1818 Full Code [id number]  Etta Quill, DO ED    08/23/2015 1006 08/26/2015 1701 Full Code [id number]  Bonnielee Haff, MD Inpatient   08/21/2015 0513 08/21/2015 0540 Full Code [id number]  Ivor Costa, MD ED   01/01/2015 2218 01/05/2015 2232 Full Code [id number]  Deneise Lever, MD ED       Prognosis:   less than 2 weeks: poor prognosis with adult failure to thrive secondary to severe protein calorie malnutrition, high aspiration risk with copious secretions, recurrent anemia, AVM's, ETOH abuse. High risk for decompensation.  Discharge Planning:  Hospice facility   Care plan was discussed with patient, wife,  Dr. Quincy Simmonds,   Thank you for allowing the Palliative Medicine Team to assist in the care of this patient.   Time In: 10 Time Out: 10.35 Total Time 70min Prolonged Time Billed no      Greater than 50%  of this time was spent counseling and coordinating care related to the above assessment and plan.  Loistine Chance MD [phone]  Palliative Medicine Team  Phone: [phone] Fax: [phone]  Please contact Palliative Medicine Team phone at [phone] for questions and concerns.

## 2018-04-02 NOTE — Progress Notes (Signed)
Nutrition Brief Note  Chart reviewed. Pt now transitioning to comfort care.  No further nutrition interventions warranted at this time.   Xzavion Doswell, MS, RD, LDN [facility] Clinical Dietitian Pager: [phone] After Hours Pager: [phone]   

## 2018-04-02 NOTE — Progress Notes (Signed)
Pt's wife discussed prognosis with providers and has requested pt be referred for hospice care. Residential hospice deemed appropriate - wife requesting [employer] [facility].  Made referral and will follow.  Sharren Bridge, MSW, LCSW Clinical Social Work 04/02/2018 [phone]

## 2018-04-02 NOTE — Progress Notes (Signed)
Hypoglycemic Event  CBG: 67   Treatment: 1/2 amp dextrose   Symptoms: AMS, baseline   Follow-up CBG: Time: 1645   CBG Result: 118  Possible Reasons for Event: EOL care   Comments/MD notified: Quincy Simmonds notified per protocol. EOL care. Pt resting comfortably. Will continue to monitor.   Rueben Bash

## 2018-04-02 NOTE — Progress Notes (Signed)
PT Cancellation Note  Patient Details Name: James [name] MRN: [id number] DOB: Aug 04, 1956   Cancelled Treatment:    Reason Eval/Treat Not Completed: Other (comment) Per chart review, patient is transitioning to comfort care at this point due to poor health status and prognosis.   Discharging PT at this point- if family should express desire to continue skilled PT care, or if patient status changes and he becomes medically appropriate, please put in new MD order to restart Physical Therapy.    Deniece Ree PT, DPT, CBIS  Supplemental Physical Therapist [facility]   Pager [phone]

## 2018-04-02 NOTE — Progress Notes (Addendum)
[facility] RN note.  Received request from Sharren Bridge, [place] for family interest in [facility]. Chart reviewed and patient is eligible for transfer. Spoke with wife, [name] to confirm interest and explain services by telephone.  Unfortunately [facility] is not able to offer a room today.  CSW is aware [facility] liaison will follow up with CSW and family tomorrow or sooner if room becomes available. Please do not hesitate to call with questions.  Thank you.   Farrel Gordon, RN, [facility] Liaison  [place] are on AMION

## 2018-04-02 NOTE — Progress Notes (Signed)
PROGRESS NOTE [facility]   [name] [place]   CBJ:[id number] DOB: April 02, 1956  DOA: 03/14/2018 PCP: Denita Lung, MD   Brief Narrative:  James [name]  is a 62 y.o.malepast medical history recurrent severe anemia requiring multiple transfusions due to AVMs with chronic blood loss, alcohol abuse who came in for weakness, near syncope and was found to have a hemoglobin of 2. He was noted to be confused in the ED with Temp of 91.2, BP 92/54, WBC 43.000. He was admitted for blood transfusions, started on IV antibiotic cefepime. Due to diarrhea, C Diff was checked which returned positive. He was started on PO vanco via NGT. Due to acute encephalopathy, MRI was completed which revealed bilateral subdural hematomas vs intracranial hypotension vs pachymeningitis. Neurology was consulted and they have recommended LP, which has not been completed due to patient's agitation. Hospitalization complicated due to patient's altered mentation, aspiration risk, poor PO intake.   Subjective: Patient seen and examined, continues to be lethargic.  Palliative care discussed with case with wife and recommended comfort care and hospice facility.  Assessment & Plan: Symptomatic anemia - stable  Initially presented with hemoglobin of 2, history of AVM.  FOBT was negative.  Patient has been transfused a total of 6 units of PRBCs.  Hemoglobin has been stable.  No active problem patient now hospice.  Sepsis due to C. difficile colitis - resolved  Sepsis physiology has resolved, patient finished oral vancomycin course  Acute toxic encephalopathy - remains lethargic  Unclear etiology, initial thought to be delirium due to alcohol withdrawal however did not significantly improve after withdrawal window.  MRI was completed on 7/11 which revealed diffuse smooth dural thickening over the cerebral convexities.  No significant mass-effect.  Findings probably represent subdural hematoma or intracranial hypotension.   Neurology was consulted and LP was recommended however unable to complete due to lack of cooperation/agitation. Neurology on 7/18 recommended to hold on LP as patient has some clinical improvement. Also could consider repeating CT head without contrast or MRI if clinical changes. CT was repeated just today and did not show any other acute abnormalities, ammonia is trending.  Family has decided to make patient comfort care, will not check blood work and will keep patient comfortable  Severe protein calorie malnutrition/dysphagia and aspiration risk Plan was to place PEG tube per IR, however unable to place due to ascites, his stoma is seen posterior his distal transverse colon and splenic flexure and with history of gastric AVM and portal gastropathy there is increased risk of bleeding.  General surgical evaluation was recommended however this was not advised as patient was too weak for surgical procedure.  Now on comfort measures  Tobacco abuse Nicotine patch   Alcohol abuse with withdrawal Out of window for alcohol withdrawal.  Hepatomegaly with thrombocytopenia Secondary to alcohol abuse  Persistent hypokalemia/hypomagnesemia Due to poor oral intake, was repleted via IV.  DVT prophylaxis: SCDs Code Status: Full code Family Communication: No family at bedside Disposition Plan: Unclear at this time   Consultants:   IR   Palliative   Neurology   Antimicrobials: Anti-infectives (From admission, onward)   Start     Dose/Rate Route Frequency Ordered Stop   03/22/18 1600  metroNIDAZOLE (FLAGYL) tablet 500 mg  Status:  Discontinued     500 mg Oral Every 8 hours 03/22/18 1325 03/23/18 0809   03/21/18 0000  vancomycin (VANCOCIN) 50 mg/mL oral solution 125 mg     125 mg Oral Every 6 hours  03/20/18 2136 03/28/18 2359   03/21/18 0000  vancomycin (VANCOCIN) 500 mg in sodium chloride irrigation 0.9 % 100 mL ENEMA     500 mg Rectal Every 6 hours 03/20/18 2136 03/28/18 2359   03/19/18 2000   vancomycin (VANCOCIN) 500 mg in sodium chloride irrigation 0.9 % 100 mL ENEMA  Status:  Discontinued     500 mg Rectal Every 6 hours 03/19/18 1626 03/20/18 2136   03/19/18 1630  metroNIDAZOLE (FLAGYL) IVPB 500 mg  Status:  Discontinued     500 mg 100 mL/hr over 60 Minutes Intravenous Every 8 hours 03/19/18 1626 03/22/18 1325   03/17/18 1000  vancomycin (VANCOCIN) 50 mg/mL oral solution 125 mg  Status:  Discontinued     125 mg Oral 4 times daily 03/17/18 0739 03/20/18 2136   03/15/18 0800  ceFEPIme (MAXIPIME) 1 g in sodium chloride 0.9 % 100 mL IVPB  Status:  Discontinued     1 g 200 mL/hr over 30 Minutes Intravenous Every 8 hours 03/15/18 0603 03/17/18 0737   03/15/18 0130  ceFEPIme (MAXIPIME) 2 g in sodium chloride 0.9 % 100 mL IVPB     2 g 200 mL/hr over 30 Minutes Intravenous  Once 03/15/18 0117 03/15/18 0220         Objective: Vitals:   04/01/18 0249 04/01/18 0644 04/01/18 2100 04/02/18 0538  BP:  105/78 (!) 117/55 (!) 98/51  Pulse:  92 (!) 112 (!) 102  Resp:  20 (!) 24 20  Temp:  (!) 97.3 F (36.3 C)  98 F (36.7 C)  TempSrc:  Oral  Oral  SpO2:  98% 100% 97%  Weight: 48.7 kg (107 lb 5.8 oz)   50.8 kg (111 lb 15.9 oz)  Height:        Intake/Output Summary (Last 24 hours) at 04/02/2018 1507 Last data filed at 04/02/2018 0919 Gross per 24 hour  Intake 0 ml  Output 700 ml  Net -700 ml   Filed Weights   03/31/18 0536 04/01/18 0249 04/02/18 0538  Weight: 49.6 kg (109 lb 5.6 oz) 48.7 kg (107 lb 5.8 oz) 50.8 kg (111 lb 15.9 oz)    Examination: No changes on exam from 7/25  General: Lethargic, minimally responsive  HEENT: Scleral icteric  Cardiovascular: RRR, S1/S2  Respiratory: CTA  Abdominal: Soft, NT, ND, bowel sounds + Extremities: no edema, no cyanosis Neurology: Lethargic, slow response to verbal stimuli, follow simple commands    Data Reviewed: I have personally reviewed following labs and imaging studies  CBC: Recent Labs  Lab 03/29/18 0500  03/30/18 0620 03/31/18 0644 04/01/18 0437 04/02/18 0500  WBC 8.2 8.2 9.8 9.6 9.2  NEUTROABS  --   --   --  7.1  --   HGB 8.4* 8.3* 8.3* 8.5* 8.1*  HCT 25.5* 25.9* 25.4* 25.7* 24.6*  MCV 83.9 85.5 84.9 84.3 84.8  PLT 127* 111* 115* 114* 782*   Basic Metabolic Panel: Recent Labs  Lab 03/28/18 0500 03/29/18 0500 03/30/18 0620 03/31/18 0644 04/01/18 0437 04/02/18 0500  NA 134* 134* 134* 133* 135 133*  K 3.0* 3.2* 3.4* 3.5 3.2* 2.9*  CL 109 107 107 108 109 109  CO2 21* 21* 21* 18* 21* 21*  GLUCOSE 208* 76 80 75 75 81  BUN <5* <5* <5* <5* <5* <5*  CREATININE <0.30* <0.30* <0.30* <0.30* <0.30* 0.30*  CALCIUM 7.2* 7.5* 7.5* 7.4* 7.5* 7.1*  MG 1.6* 1.4* 1.5* 1.6* 1.5*  --    GFR: Estimated Creatinine Clearance: 70.6 mL/min (  A) (by C-G formula based on SCr of 0.3 mg/dL (L)). Liver Function Tests: Recent Labs  Lab 04/02/18 0500  AST 37  ALT 23  ALKPHOS 93  BILITOT 6.9*  PROT 4.6*  ALBUMIN 1.4*   No results for input(s): LIPASE, AMYLASE in the last 168 hours. Recent Labs  Lab 04/01/18 1112 04/02/18 0500  AMMONIA 34 44*   Coagulation Profile: No results for input(s): INR, PROTIME in the last 168 hours. Cardiac Enzymes: No results for input(s): CKTOTAL, CKMB, CKMBINDEX, TROPONINI in the last 168 hours. BNP (last 3 results) No results for input(s): PROBNP in the last 8760 hours. HbA1C: No results for input(s): HGBA1C in the last 72 hours. CBG: Recent Labs  Lab 04/01/18 2052 04/02/18 0044 04/02/18 0524 04/02/18 0747 04/02/18 1212  GLUCAP 136* 71 77 82 72   Lipid Profile: No results for input(s): CHOL, HDL, LDLCALC, TRIG, CHOLHDL, LDLDIRECT in the last 72 hours. Thyroid Function Tests: No results for input(s): TSH, T4TOTAL, FREET4, T3FREE, THYROIDAB in the last 72 hours. Anemia Panel: No results for input(s): VITAMINB12, FOLATE, FERRITIN, TIBC, IRON, RETICCTPCT in the last 72 hours. Sepsis Labs: No results for input(s): PROCALCITON, LATICACIDVEN in the last  168 hours.  No results found for this or any previous visit (from the past 240 hour(s)).    Radiology Studies: Ct Head Wo Contrast  Result Date: 04/01/2018 CLINICAL DATA:  Altered level of consciousness. EXAM: CT HEAD WITHOUT CONTRAST TECHNIQUE: Contiguous axial images were obtained from the base of the skull through the vertex without intravenous contrast. COMPARISON:  CT scan of March 14, 2018. FINDINGS: Brain: Mild diffuse cortical atrophy is noted. Mild chronic ischemic white matter disease is noted. No mass effect or midline shift is noted. Ventricular size is within normal limits. There is no evidence of mass lesion, hemorrhage or acute infarction. Vascular: No hyperdense vessel or unexpected calcification. Skull: Normal. Negative for fracture or focal lesion. Sinuses/Orbits: No acute finding. Other: None. IMPRESSION: Mild diffuse cortical atrophy. Mild chronic ischemic white matter disease. No acute intracranial abnormality seen. Electronically Signed   By: Marijo Conception, M.D.   On: 04/01/2018 19:09      Scheduled Meds: . folic acid  1 mg Intravenous Daily  . nicotine  21 mg Transdermal Daily  . scopolamine  1 patch Transdermal Q72H  . sodium chloride flush  10-40 mL Intracatheter Q12H  . thiamine injection  100 mg Intravenous Daily   Continuous Infusions: . dextrose 5 % and 0.45% NaCl 75 mL/hr at 04/02/18 0718  . feeding supplement (OSMOLITE 1.5 CAL)       LOS: 19 days    Time spent: Total of 25 minutes spent with pt, greater than 50% of which was spent in discussion of  treatment, counseling and coordination of care   Chipper Oman, MD Pager: Text Page via [email or web address]   If 7PM-7AM, please contact night-coverage [email or web address] 04/02/2018, 3:07 PM   Note - This record has been created using [employer]. Chart creation errors have been sought, but may not always have been located. Such creation errors do not reflect on the standard of medical care.

## 2018-04-03 LAB — GLUCOSE, CAPILLARY
GLUCOSE-CAPILLARY: 64 mg/dL — AB (ref 70–99)
GLUCOSE-CAPILLARY: 68 mg/dL — AB (ref 70–99)
GLUCOSE-CAPILLARY: 70 mg/dL (ref 70–99)
GLUCOSE-CAPILLARY: 71 mg/dL (ref 70–99)
GLUCOSE-CAPILLARY: 83 mg/dL (ref 70–99)
GLUCOSE-CAPILLARY: 84 mg/dL (ref 70–99)
Glucose-Capillary: 72 mg/dL (ref 70–99)

## 2018-04-03 NOTE — Progress Notes (Addendum)
PROGRESS NOTE [facility]   Gerardo Caiazzo [place]   TJQ:[id number] DOB: 1956/06/01  DOA: 03/14/2018 PCP: Denita Lung, MD   Brief Narrative:  [name]  is a 62 y.o.malepast medical history recurrent severe anemia requiring multiple transfusions due to AVMs with chronic blood loss, alcohol abuse who came in for weakness, near syncope and was found to have a hemoglobin of 2. He was noted to be confused in the ED with Temp of 91.2, BP 92/54, WBC 43.000. He was admitted for blood transfusions, started on IV antibiotic cefepime. Due to diarrhea, C Diff was checked which returned positive. He was started on PO vanco via NGT. Due to acute encephalopathy, MRI was completed which revealed bilateral subdural hematomas vs intracranial hypotension vs pachymeningitis. Neurology was consulted and they have recommended LP, which has not been completed due to patient's agitation. Hospitalization complicated due to patient's altered mentation, aspiration risk, poor PO intake.   Subjective: Patient is resting in bed and appears to be weaker, since more jaundiced anicteric.  Responsive but disoriented.  Assessment & Plan: Symptomatic anemia - stable Sepsis due to C. difficile colitis - resolved  Acute toxic encephalopathy - remains lethargic Severe protein calorie malnutrition/dysphagia and aspiration risk Tobacco abuse Alcohol abuse with withdrawal Alcohol Cirrhosis/Hepatomegaly with thrombocytopenia Persistent hypokalemia/hypomagnesemia  Meeting was held with palliative care team (Dr. Rowe Pavy) patient condition was discussed in current status which is not reversible.  Patient with the diagnosis mentioned above.  Currently not improving and deconditioning with no nutrition.  Abdominal anatomy not favoring PEG placement due to poor window and gastric AVMs.  He was discussed that patient will benefit from hospice care as he is likely to be entering in multiorgan failure and impending death.  Poor  prognosis.  Family understand current situation and decided that they will pursue comfort measures to avoid patient suffering.  Family are okay to continue DNR/DNI.  They will be touring become placed today and decide between residential hospice versus home with hospice at the time of discharge.  Later this afternoon discussed case with social worker and reported to me that family have decided for hospice facility, however no bed available at this time.  Will discharge in a.m. if bed available  DVT prophylaxis: SCDs Code Status: Full code Family Communication: No family at bedside Disposition Plan: Hospice facility  Consultants:   IR   Palliative   Neurology   Antimicrobials: Anti-infectives (From admission, onward)   Start     Dose/Rate Route Frequency Ordered Stop   03/22/18 1600  metroNIDAZOLE (FLAGYL) tablet 500 mg  Status:  Discontinued     500 mg Oral Every 8 hours 03/22/18 1325 03/23/18 0809   03/21/18 0000  vancomycin (VANCOCIN) 50 mg/mL oral solution 125 mg     125 mg Oral Every 6 hours 03/20/18 2136 03/28/18 2359   03/21/18 0000  vancomycin (VANCOCIN) 500 mg in sodium chloride irrigation 0.9 % 100 mL ENEMA     500 mg Rectal Every 6 hours 03/20/18 2136 03/28/18 2359   03/19/18 2000  vancomycin (VANCOCIN) 500 mg in sodium chloride irrigation 0.9 % 100 mL ENEMA  Status:  Discontinued     500 mg Rectal Every 6 hours 03/19/18 1626 03/20/18 2136   03/19/18 1630  metroNIDAZOLE (FLAGYL) IVPB 500 mg  Status:  Discontinued     500 mg 100 mL/hr over 60 Minutes Intravenous Every 8 hours 03/19/18 1626 03/22/18 1325   03/17/18 1000  vancomycin (VANCOCIN) 50 mg/mL oral solution 125 mg  Status:  Discontinued     125 mg Oral 4 times daily 03/17/18 0739 03/20/18 2136   03/15/18 0800  ceFEPIme (MAXIPIME) 1 g in sodium chloride 0.9 % 100 mL IVPB  Status:  Discontinued     1 g 200 mL/hr over 30 Minutes Intravenous Every 8 hours 03/15/18 0603 03/17/18 0737   03/15/18 0130  ceFEPIme  (MAXIPIME) 2 g in sodium chloride 0.9 % 100 mL IVPB     2 g 200 mL/hr over 30 Minutes Intravenous  Once 03/15/18 0117 03/15/18 0220         Objective: Vitals:   04/02/18 2025 04/03/18 0210 04/03/18 0425 04/03/18 1152  BP: 115/61  100/62 (!) 102/59  Pulse: 72  96 96  Resp: 18  20 16   Temp: 98.7 F (37.1 C)  98 F (36.7 C) 98.6 F (37 C)  TempSrc: Oral  Axillary Oral  SpO2: 98%  99% 100%  Weight:  55.6 kg (122 lb 9.2 oz)    Height:        Intake/Output Summary (Last 24 hours) at 04/03/2018 1615 Last data filed at 04/03/2018 0600 Gross per 24 hour  Intake 2910 ml  Output 850 ml  Net 2060 ml   Filed Weights   04/01/18 0249 04/02/18 0538 04/03/18 0210  Weight: 48.7 kg (107 lb 5.8 oz) 50.8 kg (111 lb 15.9 oz) 55.6 kg (122 lb 9.2 oz)    Examination:   General: Ill looking, icteric and jaundiced Cardiovascular: RRR, S1/S2 + Respiratory: Shallow breathing. Extremities: Trace edema Neurology: Delayed and inattentive, lethargic  Data Reviewed: I have personally reviewed following labs and imaging studies  CBC: Recent Labs  Lab 03/29/18 0500 03/30/18 0620 03/31/18 0644 04/01/18 0437 04/02/18 0500  WBC 8.2 8.2 9.8 9.6 9.2  NEUTROABS  --   --   --  7.1  --   HGB 8.4* 8.3* 8.3* 8.5* 8.1*  HCT 25.5* 25.9* 25.4* 25.7* 24.6*  MCV 83.9 85.5 84.9 84.3 84.8  PLT 127* 111* 115* 114* 099*   Basic Metabolic Panel: Recent Labs  Lab 03/28/18 0500 03/29/18 0500 03/30/18 0620 03/31/18 0644 04/01/18 0437 04/02/18 0500  NA 134* 134* 134* 133* 135 133*  K 3.0* 3.2* 3.4* 3.5 3.2* 2.9*  CL 109 107 107 108 109 109  CO2 21* 21* 21* 18* 21* 21*  GLUCOSE 208* 76 80 75 75 81  BUN <5* <5* <5* <5* <5* <5*  CREATININE <0.30* <0.30* <0.30* <0.30* <0.30* 0.30*  CALCIUM 7.2* 7.5* 7.5* 7.4* 7.5* 7.1*  MG 1.6* 1.4* 1.5* 1.6* 1.5*  --    GFR: Estimated Creatinine Clearance: 77.2 mL/min (A) (by C-G formula based on SCr of 0.3 mg/dL (L)). Liver Function Tests: Recent Labs  Lab  04/02/18 0500  AST 37  ALT 23  ALKPHOS 93  BILITOT 6.9*  PROT 4.6*  ALBUMIN 1.4*   No results for input(s): LIPASE, AMYLASE in the last 168 hours. Recent Labs  Lab 04/01/18 1112 04/02/18 0500  AMMONIA 34 44*   Coagulation Profile: No results for input(s): INR, PROTIME in the last 168 hours. Cardiac Enzymes: No results for input(s): CKTOTAL, CKMB, CKMBINDEX, TROPONINI in the last 168 hours. BNP (last 3 results) No results for input(s): PROBNP in the last 8760 hours. HbA1C: No results for input(s): HGBA1C in the last 72 hours. CBG: Recent Labs  Lab 04/02/18 2356 04/03/18 0430 04/03/18 0725 04/03/18 1148 04/03/18 1558  GLUCAP 68* 83 84 72 71   Lipid Profile: No results for input(s): CHOL, HDL,  LDLCALC, TRIG, CHOLHDL, LDLDIRECT in the last 72 hours. Thyroid Function Tests: No results for input(s): TSH, T4TOTAL, FREET4, T3FREE, THYROIDAB in the last 72 hours. Anemia Panel: No results for input(s): VITAMINB12, FOLATE, FERRITIN, TIBC, IRON, RETICCTPCT in the last 72 hours. Sepsis Labs: No results for input(s): PROCALCITON, LATICACIDVEN in the last 168 hours.  No results found for this or any previous visit (from the past 240 hour(s)).    Radiology Studies: Ct Head Wo Contrast  Result Date: 04/01/2018 CLINICAL DATA:  Altered level of consciousness. EXAM: CT HEAD WITHOUT CONTRAST TECHNIQUE: Contiguous axial images were obtained from the base of the skull through the vertex without intravenous contrast. COMPARISON:  CT scan of March 14, 2018. FINDINGS: Brain: Mild diffuse cortical atrophy is noted. Mild chronic ischemic white matter disease is noted. No mass effect or midline shift is noted. Ventricular size is within normal limits. There is no evidence of mass lesion, hemorrhage or acute infarction. Vascular: No hyperdense vessel or unexpected calcification. Skull: Normal. Negative for fracture or focal lesion. Sinuses/Orbits: No acute finding. Other: None. IMPRESSION: Mild  diffuse cortical atrophy. Mild chronic ischemic white matter disease. No acute intracranial abnormality seen. Electronically Signed   By: Marijo Conception, M.D.   On: 04/01/2018 19:09    Scheduled Meds: . folic acid  1 mg Intravenous Daily  . nicotine  21 mg Transdermal Daily  . scopolamine  1 patch Transdermal Q72H  . sodium chloride flush  10-40 mL Intracatheter Q12H  . thiamine injection  100 mg Intravenous Daily   Continuous Infusions: . dextrose 5 % and 0.45% NaCl 75 mL/hr at 04/03/18 1013  . feeding supplement (OSMOLITE 1.5 CAL)       LOS: 20 days    Time spent: Total of 25 minutes spent with pt, greater than 50% of which was spent in discussion of  treatment, counseling and coordination of care  Chipper Oman, MD Pager: Text Page via [email or web address]   If 7PM-7AM, please contact night-coverage [email or web address] 04/03/2018, 4:15 PM   Note - This record has been created using [employer]. Chart creation errors have been sought, but may not always have been located. Such creation errors do not reflect on the standard of medical care.

## 2018-04-03 NOTE — Progress Notes (Signed)
LCSW notified by [facility] that family wants patient to remain a full code.   Family wants to speak with attending to make a realistic plan.   Disposition TBD.   LCSW will continue to follow for dc needs.   Carolin Coy [place]

## 2018-04-03 NOTE — Progress Notes (Signed)
LCSW attempted to reach family to confirm plan.   No family in room and no answer at number listed on facesheet.   LCSW followed up with [facility]. Family toured facility and are agreeable to [facility]. Per Olivia Mackie, no bed available today. [facility] will follow up in the AM with bed availability.   LCSW will continue to follow.

## 2018-04-03 NOTE — Progress Notes (Signed)
Daily Progress Note   Patient Name: James [name]       Date: 04/03/2018 DOB: 1956/06/08  Age: 62 y.o. MRN#: [id number] Attending Physician: James Pour, Christean Grief, MD Primary Care Physician: James Lung, MD Admit Date: 03/14/2018  Reason for Consultation/Follow-up: Establishing goals of care  Subjective: Patient is resting in bed, he appears weaker He appears more icteric He appears to have more third spacing/anasarca in appearance.   Son, wife, daughter and several other family members are at the bedside  A goals of care conversation was requested to re discuss hospice services, code status and to discuss about best possible disposition options.   A family meeting ensued, along with Dr James Simmonds [facility] MD.   See below   See below:  Length of Stay: 20  Current Medications: Scheduled Meds:  . folic acid  1 mg Intravenous Daily  . nicotine  21 mg Transdermal Daily  . scopolamine  1 patch Transdermal Q72H  . sodium chloride flush  10-40 mL Intracatheter Q12H  . thiamine injection  100 mg Intravenous Daily    Continuous Infusions: . dextrose 5 % and 0.45% NaCl 75 mL/hr at 04/03/18 1013  . feeding supplement (OSMOLITE 1.5 CAL)      PRN Meds: albuterol, fluticasone, LORazepam, [DISCONTINUED] ondansetron **OR** ondansetron (ZOFRAN) IV, sodium chloride flush  Physical Exam  Constitutional: He is easily aroused. He appears ill.  HENT:  Head: Normocephalic and atraumatic.  Pulmonary/Chest: No accessory muscle usage. No tachypnea. No respiratory distress.  Audible secretions  Neurological: He is easily aroused.  Drowsy  Skin: Skin is warm and dry.  Psychiatric: His speech is delayed. He is inattentive.  Nursing note and vitals reviewed.          Vital Signs: BP 100/62 (BP  Location: Left Arm)   Pulse 96   Temp 98 F (36.7 C) (Axillary)   Resp 20   Ht 5\' 10"  (1.778 m)   Wt 55.6 kg (122 lb 9.2 oz)   SpO2 99%   BMI 17.59 kg/m  SpO2: SpO2: 99 % O2 Device: O2 Device: Room Air O2 Flow Rate: O2 Flow Rate (L/min): 2 L/min  Intake/output summary:   Intake/Output Summary (Last 24 hours) at 04/03/2018 1137 Last data filed at 04/03/2018 0600 Gross per 24 hour  Intake 2910 ml  Output 850  ml  Net 2060 ml   LBM: Last BM Date: 04/03/18 Baseline Weight: Weight: 45.9 kg (101 lb 3.1 oz) Most recent weight: Weight: 55.6 kg (122 lb 9.2 oz)       Palliative Assessment/Data: PPS 30%   Flowsheet Rows     Most Recent Value  Intake Tab  Referral Department  Hospitalist  Unit at Time of Referral  Oncology Unit  Date Notified  03/25/18  Palliative Care Type  New Palliative care  Reason for referral  Clarify Goals of Care  Date of Admission  03/14/18  Date first seen by Palliative Care  03/27/18  # of days Palliative referral response time  2 Day(s)  # of days IP prior to Palliative referral  11  Clinical Assessment  Psychosocial & Spiritual Assessment  Palliative Care Outcomes      Patient Active Problem List   Diagnosis Date Noted  . At risk for aspiration   . Palliative care by specialist   . Goals of care, counseling/discussion   . C. difficile colitis   . Altered mental status   . Dysphagia   . Thrombocytopenia ([place]) 03/16/2018  . Acute metabolic encephalopathy 82/95/6213  . Watery diarrhea 03/16/2018  . Microcytic anemia 09/10/2017  . Acute respiratory failure with hypoxia ([place])   . Hypokalemia 03/02/2017  . Symptomatic anemia 03/02/2017  . Hyponatremia 03/02/2017  . Right hip pain 03/02/2017  . Lactic acidosis   . History of peptic ulcer disease 10/06/2016  . Poor dentition 10/06/2016  . Iron deficiency anemia due to chronic blood loss 09/17/2016  . Alcoholic gastritis with hemorrhage   . Marijuana abuse   . Severe protein-calorie  malnutrition ([place]) 01/02/2015  . 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 Pulmonary nodule 10/24/2011  . Cachexia ([place]) 10/24/2011  . Benign neoplasm of colon 10/03/2011  . Symptomatic Anemia  10/01/2011  . Tobacco abuse 10/01/2011  . Alcohol abuse 10/01/2011  . Dysphagia, unspecified(787.20) 10/01/2011    Palliative Care Assessment & Plan   Patient Profile: 62 y.o. male  with past medical history of recurrent severe anemia (hx AVM) alcohol abuse admitted on 03/14/2018 with weakness and low Hgb and found to have sepsis from c-diff colitis.  Continues to have acute encephalopathy.  Concern for aspiration as well.  Palliative consulted for goals of care.     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   Recommendations/Plan:  I arrived at the bedside and a family meeting was held along with Dr James Simmonds [facility] MD.  Wife, son, daughter, sister and several other family members were in attendance.  We re discussed about the patient's current condition.  Patient has anemia, ETOH use, history of colon polyps, portal HTN gastropathy, GI bleed, 40 pound weight loss in January 2019. Currently, in this hospitalization, patient has C diff colitis, acute encephalopathy, CT showing cirrhosis ascites gastric AVMs.   Abdominal anatomy not favoring PEG placement, IR has directly discussed with patient's wife.   On 04-02-18, after having received a call from patient's wife James [name] requesting information about patient's current condition and prognosis, the decision was made to proceed with DNR DNI and residential hospice.   We reviewed again, about the patient's frailty, his ongoing acute and chronic conditions, his high likelihood of multi organ failure and impending death, prognosis likely  not more than 2 weeks at this point.  Discussed frankly and compassionately with family at bedside about focusing on ensuring comfort measures and to focus on making sure the patient avoids suffering, regardless of his life expectancy or his estimated prognosis.   Plan: DNR DNI Family to [facility] today. Family to decide between home with hospice versus residential hospice at time of discharge. We discussed extensively about pros and cons of home with hospice. Family doesn't want hospice home in [place], since they all live in [place].  All of their questions and concerns addressed to the best of my ability. Appreciate Dr James Simmonds [facility] MD presence, input and recommendations.    Prognosis:   less than 2 weeks: poor prognosis with adult failure to thrive secondary to severe protein calorie malnutrition, high aspiration risk with copious secretions, recurrent anemia, AVM's, ETOH abuse. High risk for decompensation.  Discharge Planning:  Hospice facility versus home with hospice. Family to tour residential hospice [facility] and CSW to follow up with them.   Care plan was discussed with patient, wife,  Dr. Quincy Simmonds, patient's children and several other family members.   Thank you for allowing the Palliative Medicine Team to assist in the care of this patient.   Time In: 11 Time Out: 11.45 Total Time 45 Prolonged Time Billed no      Greater than 50%  of this time was spent counseling and coordinating care related to the above assessment and plan.  Loistine Chance MD [phone]  Palliative Medicine Team  Phone: [phone] Fax: [phone]  Please contact Palliative Medicine Team phone at [phone] for questions and concerns.

## 2018-04-04 LAB — GLUCOSE, CAPILLARY
GLUCOSE-CAPILLARY: 80 mg/dL (ref 70–99)
Glucose-Capillary: 67 mg/dL — ABNORMAL LOW (ref 70–99)
Glucose-Capillary: 68 mg/dL — ABNORMAL LOW (ref 70–99)

## 2018-04-04 NOTE — Progress Notes (Signed)
Patient discharging to [facility]. CSW faxed dc summary to [facility]. PTAR contacted, patient's family notified ([place] left voicemail for Keiji Melland [phone]). Packet complete, Patient's RN can call report to [phone]. CSW signing off, no other needs identified at this time.  Abundio Miu, [place] Social Worker [facility] Cell#: [phone]

## 2018-04-04 NOTE — Discharge Summary (Signed)
Physician Discharge Summary  James [name]  HYI:[id number]  DOB: [birth year]  DOA: 03/14/2018 PCP: Denita Lung, MD  Admit date: 03/14/2018 Discharge date: 04/04/2018  Admitted From: Home  Disposition: [place]    Discharge Condition: Comfort care  CODE STATUS: DNR  Diet recommendation: Comfort feeds   Brief/Interim Summary: For full details see H&P/Progress note, but in brief, RONDELL [name] is a  62 y.o.malew/ past medical history recurrent severe anemia requiring multiple transfusions due to AVMs with chronic blood loss, alcohol abuse who came in for weakness, near syncope and was found to have a hemoglobin of 2. He was noted to be confused in the ED with Temp of 91.2, BP 92/54, WBC 43.000. He was admitted for blood transfusions, started on IV antibiotic cefepime. Due to diarrhea, C Diff was checked which returned positive. He was started on PO vanco via NGT. Due to acute encephalopathy, MRI was completed which revealed bilateral subdural hematomas vs intracranial hypotension vs pachymeningitis. Neurology was consulted and they have recommended LP, which was note completed due to patient's agitation. Hospitalization complicated due to patient's altered mentation/agitatation, aspiration risk, poor PO intake.  Discharge Diagnoses/[name] Course:  Symptomatic anemia - stable  Initially presented with hemoglobin of 2, history of AVM. FOBT was negative.  Patient has been transfused a total of 6 units of PRBCs.  Hemoglobin has been stable.  No active problem patient now hospice.  Sepsis due to C. difficile colitis - resolved  Sepsis physiology has resolved, patient finished oral vancomycin course  Acute toxic/metabolic encephalopathy - remains lethargic  Unclear etiology, initial thought to be delirium due to alcohol withdrawal however did not significantly improve after withdrawal window.  MRI was completed on 7/11 which revealed diffuse smooth dural thickening over the cerebral  convexities.  No significant mass-effect.  Findings probably represent subdural hematoma or intracranial hypotension vs pachymeningitis. Moderate parenchymal volume loss.   Neurology was consulted and LP was recommended however unable to complete due to lack of cooperation/agitation. Neurology on 7/18 recommended to hold on LP as patient had some clinical improvement and to consider repeating CT head without contrast or MRI if clinical changes. On 7/25 patient was found to be more lethargic, CT was repeated did not show any other acute abnormalities. Ammonia levels were noted to be trending up however since family decide for comfort measures Lactulose was not added. Etiology remained unclear, however it was felt that this was multifactorial in setting of sepsis with hepatic cirrhosis and alcohol induce encephalopathy perhaps ? Wernicke's. Despite treatment and efforts patient never recovered and with signs of multiorgan failure, family decide to make patient comfort care.   Severe protein calorie malnutrition/dysphagia and aspiration risk Plan was to place PEG tube per IR, however unable to place due to ascites, his stoma is seen posterior his distal transverse colon and splenic flexure and with history of gastric AVM and portal gastropathy there is increased risk of bleeding.  General surgical evaluation was recommended however this was not advised as patient was poor candidate for surgical procedure. Ok to get comfort feeds per hospice protocol.  Alcoholic cirrhosis with ascites  CT scan show signs of cirrhosis, LFT were ok, however bili and ammonia trended up.   Tobacco abuse Nicotine patch   Alcohol abuse with withdrawal Out of window for alcohol withdrawal.  Hepatomegaly with thrombocytopenia Secondary to alcohol abuse  Persistent hypokalemia/hypomagnesemia Due to poor oral intake, was repleted via IV.  Discharge Instructions  You were cared for by a  hospitalist during your [name]  stay. If you have any questions about your discharge medications or the care you received while you were in the [name] after you are discharged, you can call the unit and asked to speak with the hospitalist on call if the hospitalist that took care of you is not available. Once you are discharged, your primary care physician will handle any further medical issues. Please note that NO REFILLS for any discharge medications will be authorized once you are discharged, as it is imperative that you return to your primary care physician (or establish a relationship with a primary care physician if you do not have one) for your aftercare needs so that they can reassess your need for medications and monitor your lab values.  Discharge Instructions    Call MD for:  difficulty breathing, headache or visual disturbances   Complete by:  As directed    Call MD for:  extreme fatigue   Complete by:  As directed    Call MD for:  hives   Complete by:  As directed    Call MD for:  persistant dizziness or light-headedness   Complete by:  As directed    Call MD for:  persistant nausea and vomiting   Complete by:  As directed    Call MD for:  redness, tenderness, or signs of infection (pain, swelling, redness, odor or green/yellow discharge around incision site)   Complete by:  As directed    Call MD for:  severe uncontrolled pain   Complete by:  As directed    Call MD for:  temperature >100.4   Complete by:  As directed    Diet - low sodium heart healthy   Complete by:  As directed    Increase activity slowly   Complete by:  As directed      Allergies as of 04/04/2018   No Known Allergies     Medication List    STOP taking these medications   azithromycin 250 MG tablet Commonly known as:  ZITHROMAX   cefpodoxime 200 MG tablet Commonly known as:  VANTIN   docusate sodium 100 MG capsule Commonly known as:  COLACE   ferrous sulfate 325 (65 FE) MG tablet   folic acid 1 MG tablet Commonly known  as:  FOLVITE   nicotine 21 mg/24hr patch Commonly known as:  NICODERM CQ - dosed in mg/24 hours   pantoprazole 40 MG tablet Commonly known as:  PROTONIX   thiamine 100 MG tablet       No Known Allergies  Consultations:  Neurology   IR  Palliative Care    Procedures/Studies: Ct Abdomen Wo Contrast  Result Date: 03/31/2018 CLINICAL DATA:  Assessment for possible percutaneous gastrostomy tube placement. EXAM: CT ABDOMEN WITHOUT CONTRAST TECHNIQUE: Multidetector CT imaging of the abdomen was performed following the standard protocol without IV contrast. COMPARISON:  CT of the abdomen and pelvis on 09/09/2017 FINDINGS: Lower chest: Lung bases demonstrate small bilateral pleural effusions and bibasilar atelectasis. Hepatobiliary: The liver shows evidence of probable cirrhosis. The gallbladder is mildly distended and contains some layering calcified gallstones. No biliary ductal dilatation identified. Pancreas: Unremarkable. No pancreatic ductal dilatation or surrounding inflammatory changes. Spleen: The spleen is mildly enlarged. Adrenals/Urinary Tract: No adrenal masses. The kidneys demonstrate no evidence of hydronephrosis or visible calculi. Stomach/Bowel: The stomach shows persistent wall thickening, especially involving the proximal to mid stomach. The patient does have a known history of gastric vascular malformations and portal gastropathy. On the current  CT, the proximal stomach is posterior to the left lobe of the liver and the mid to distal stomach is posterior to the splenic flexure and distal transverse colon. No free air identified. There are some mildly dilated fluid-filled small bowel loops in the abdomen. Vascular/Lymphatic: No significant vascular findings are present. No enlarged abdominal or pelvic lymph nodes. Other: Small amount of ascites is present around the liver, spleen and elsewhere in the peritoneal cavity. Musculoskeletal: No acute or significant osseous findings.  IMPRESSION: 1. Gastric anatomy is very high risk for percutaneous gastrostomy tube placement due to positioning behind the colon as well as the presence of ascites and history of both portal gastropathy and gastric vascular malformations. Combination of risks is felt to be prohibitive for safe gastrostomy tube placement currently. 2. Evidence of cirrhosis and ascites.  Associated mild splenomegaly. 3. Mildly dilated fluid-filled small bowel in the visualized abdomen may be consistent with component of ileus or enteritis. Electronically Signed   By: Aletta Edouard M.D.   On: 03/31/2018 14:00   Dg Chest 2 View  Result Date: 03/14/2018 CLINICAL DATA:  Weakness for 3-4 days, fell today, history interstitial lung disease, former smoker EXAM: CHEST - 2 VIEW COMPARISON:  09/11/2017 FINDINGS: Normal heart size, mediastinal contours, and pulmonary vascularity. Lungs hyperinflated. Improved perihilar infiltrates versus prior study. Minimal residual accentuation of LEFT perihilar markings versus RIGHT. No definite acute infiltrate, pleural effusion or pneumothorax. Osseous structures unremarkable. IMPRESSION: Hyperinflated lungs with improved pulmonary infiltrates since previous exam. Electronically Signed   By: Lavonia Dana M.D.   On: 03/14/2018 20:29   Dg Abd 1 View  Result Date: 03/18/2018 CLINICAL DATA:  NG tube placement EXAM: ABDOMEN - 1 VIEW COMPARISON:  03/18/2018, earlier the same day. FINDINGS: NG tube is looped in the stomach. Bowel gas pattern is within normal limits. Lung bases show diffuse interstitial opacity with patchy airspace disease in the retrocardiac left base. IMPRESSION: 1. NG tube tip is in the stomach. 2. Diffuse interstitial and left basilar airspace disease. Electronically Signed   By: Misty Stanley M.D.   On: 03/18/2018 18:31   Ct Head Wo Contrast  Result Date: 04/01/2018 CLINICAL DATA:  Altered level of consciousness. EXAM: CT HEAD WITHOUT CONTRAST TECHNIQUE: Contiguous axial images  were obtained from the base of the skull through the vertex without intravenous contrast. COMPARISON:  CT scan of March 14, 2018. FINDINGS: Brain: Mild diffuse cortical atrophy is noted. Mild chronic ischemic white matter disease is noted. No mass effect or midline shift is noted. Ventricular size is within normal limits. There is no evidence of mass lesion, hemorrhage or acute infarction. Vascular: No hyperdense vessel or unexpected calcification. Skull: Normal. Negative for fracture or focal lesion. Sinuses/Orbits: No acute finding. Other: None. IMPRESSION: Mild diffuse cortical atrophy. Mild chronic ischemic white matter disease. No acute intracranial abnormality seen. Electronically Signed   By: Marijo Conception, M.D.   On: 04/01/2018 19:09   Ct Head Wo Contrast  Result Date: 03/14/2018 CLINICAL DATA:  Altered level of consciousness, seizure activity 1 hour ago EXAM: CT HEAD WITHOUT CONTRAST TECHNIQUE: Contiguous axial images were obtained from the base of the skull through the vertex without intravenous contrast. Sagittal and coronal MPR images reconstructed from axial data set. COMPARISON:  Earlier study of 03/14/2018 FINDINGS: Brain: Generalized atrophy. Normal ventricular morphology. No midline shift or mass effect. Minimal small vessel chronic ischemic changes of deep cerebral white matter. No intracranial hemorrhage, mass lesion, or evidence acute infarction. No extra-axial fluid collections.  Vascular: Atherosclerotic calcification of internal carotid arteries at skull base Skull: Intact calvaria. Wall thickening of the frontal and LEFT maxillary sinuses likely related to chronic sinusitis. Sinuses/Orbits: Air-fluid level LEFT maxillary sinus question sinusitis. Remaining visualized paranasal sinuses and mastoid air cells clear Other: N/A IMPRESSION: Atrophy with minimal small vessel chronic ischemic changes of deep cerebral white matter. No acute intracranial abnormalities. Question LEFT maxillary  sinusitis. If patient has persistent or recurrent seizures, consider MR assessment. Electronically Signed   By: Lavonia Dana M.D.   On: 03/14/2018 23:26   Ct Head Wo Contrast  Result Date: 03/14/2018 CLINICAL DATA:  Minor head trauma, fell today, weakness for 3-4 days, former smoker, history alcohol abuse EXAM: CT HEAD WITHOUT CONTRAST TECHNIQUE: Contiguous axial images were obtained from the base of the skull through the vertex without intravenous contrast. COMPARISON:  None FINDINGS: Brain: Generalized atrophy. Normal ventricular morphology. No midline shift or mass effect. Small vessel chronic ischemic changes of deep cerebral white matter. No intracranial hemorrhage, mass lesion, evidence of acute infarction, or extra-axial fluid collection. Vascular: Atherosclerotic calcifications of internal carotid arteries at skull base Skull: Demineralized but intact Sinuses/Orbits: Significant osseous thickening at the frontal sinus and LEFT maxillary sinus question related to chronic sinusitis. Air-fluid level LEFT maxillary sinus. Remaining visualized paranasal sinuses and mastoid air cells clear. Other: N/A IMPRESSION: Atrophy with small vessel chronic ischemic changes of deep cerebral white matter. No acute intracranial abnormalities. Question LEFT maxillary sinusitis. Electronically Signed   By: Lavonia Dana M.D.   On: 03/14/2018 20:13   Mr Brain Wo Contrast  Result Date: 03/18/2018 CLINICAL DATA:  62 y/o  M; encephalopathy. EXAM: MRI HEAD WITHOUT CONTRAST TECHNIQUE: Multiplanar, multiecho pulse sequences of the brain and surrounding structures were obtained without intravenous contrast. COMPARISON:  03/14/2018 CT head FINDINGS: Brain: Severe motion degradation of multiple sequences. No gross reduced diffusion to suggest acute or early subacute infarct. Small chronic infarct within the left hemi pons. Moderate diffuse brain parenchymal volume loss. No herniation. Diffuse smooth pachymeningeal thickening with T1  isointense and T2 hyperintense signal and incomplete FLAIR suppression greater over the right cerebral hemisphere than left. Vascular: Normal flow voids. Skull and upper cervical spine: Normal marrow signal. Sinuses/Orbits: Negative. Other: None. IMPRESSION: 1. Motion degraded study. 2. New diffuse smooth dural thickening over the cerebral convexities. No significant mass effect. Findings probably represent thin subdural hematomas or intracranial hypotension, less likely pachymeningitis. 3. Small chronic left hemi pons infarction. Moderate brain parenchymal volume loss. These results will be called to the ordering clinician or representative by the Radiologist Assistant, and communication documented in the PACS or zVision Dashboard. Electronically Signed   By: Kristine Garbe M.D.   On: 03/18/2018 21:09   Dg Chest Port 1 View  Result Date: 03/19/2018 CLINICAL DATA:  Pneumonia.  History of smoking. EXAM: PORTABLE CHEST 1 VIEW COMPARISON:  Chest x-rays dated 03/17/2018, 03/14/2018 and 09/09/2017. FINDINGS: The diffuse bilateral interstitial prominence is stable compared to the most recent chest x-ray of 03/17/2018, new compared to the earlier chest x-rays, presumed interstitial edema. No evidence of consolidating pneumonia. No pleural effusion or pneumothorax seen. Heart size and mediastinal contours are stable. No acute or suspicious osseous finding. IMPRESSION: No interval change. Persistent bilateral interstitial prominence, presumably edema. No evidence of consolidating pneumonia. Electronically Signed   By: Franki Cabot M.D.   On: 03/19/2018 13:51   Dg Chest Port 1 View  Result Date: 03/17/2018 CLINICAL DATA:  Status post removal of nasogastric tube from the trachea  and bronchus. EXAM: PORTABLE CHEST 1 VIEW COMPARISON:  PA and lateral chest x-ray of March 14, 2018 FINDINGS: The interstitial markings of both lungs are diffusely increased. This is new. Near confluent density is noted in the  infrahilar regions. There is no pleural effusion or pneumothorax. The heart is top-normal in size. The pulmonary vascularity is not clearly engorged. IMPRESSION: Bilateral interstitial edema possibly related to endotracheal intubation by the nasogastric tube. Small alveolar opacities in both lower lobes may reflect developing pneumonia. Electronically Signed   By: David  Martinique M.D.   On: 03/17/2018 15:32   Dg Abd Portable 1v  Result Date: 03/18/2018 CLINICAL DATA:  Nasogastric tube placement EXAM: PORTABLE ABDOMEN - 1 VIEW COMPARISON:  Portable exam at 1417 hrs compared to 03/17/2018 FINDINGS: Nasogastric tube is coiled in the stomach at the distal antrum with the tip reflected to the proximal stomach above the extent of the image. Normal bowel gas pattern. Bones demineralized. Extensive atherosclerotic calcifications. IMPRESSION: Nasogastric tube is coiled at the distal stomach with tip reflected into the proximal stomach cranial to the superior extent of this abdominal radiograph. Electronically Signed   By: Lavonia Dana M.D.   On: 03/18/2018 14:56   Dg Abd Portable 1v  Result Date: 03/17/2018 CLINICAL DATA:  Nasogastric tube placement. EXAM: PORTABLE ABDOMEN - 1 VIEW COMPARISON:  None. FINDINGS: The bowel gas pattern is normal. Distal tip of nasogastric tube is seen in the right lower lobe bronchus. No abnormal calcifications are noted. IMPRESSION: Distal tip of nasogastric tube seen in right lower lobe bronchus. I spoke to the patient's nurse who stated they were already aware of the misplacement and head already removed the tube. Electronically Signed   By: Marijo Conception, M.D.   On: 03/17/2018 15:20   Dg Addison Bailey G Tube Plc W/fl W/rad  Result Date: 03/19/2018 CLINICAL DATA:  Dysphagia.  NG tube required for clinical care. EXAM: NASO G TUBE PLACEMENT WITH FL AND WITH RAD CONTRAST:  None. FLUOROSCOPY TIME:  Fluoroscopy Time:  0 minutes and 11 seconds. Radiation Exposure Index (if provided by the  fluoroscopic device): 2.1 mGy Number of Acquired Spot Images: 0 COMPARISON:  03/17/2018. FINDINGS: Patient arrived in radiology quite agitated. Multiple technologists were required to assist with patient positioning. During attempted tube placement, the patient was bearing down, performing Valsalva maneuver, and moving extensively. Despite multiple attempts using both nostrils, the NG tube only entered the trachea. NG tube could not be successfully maneuvered into the esophagus. IMPRESSION: Unsuccessful attempt at fluoroscopically guided NG tube placement. Electronically Signed   By: Misty Stanley M.D.   On: 03/19/2018 16:55   Dg Addison Bailey G Tube Plc W/fl W/rad  Result Date: 03/18/2018 CLINICAL DATA:  Encounter for NG placement. EXAM: NASO G TUBE PLACEMENT WITH FL AND WITH RAD FLUOROSCOPY TIME:  Fluoroscopy Time:  0 minutes 59 seconds Radiation Exposure Index (if provided by the fluoroscopic device): Number of Acquired Spot Images: 0 COMPARISON:  03/17/2018 FINDINGS: The patient was unresponsive upon arriving in radiology. Informed consent could not be obtained. 33 French NG tube was placed to the right nares. Gastric placement was difficult. Multiple placements into the right and left bronchus were encountered via intermittent fluoroscopy. With the neck in flexion, the tube was eventually manipulated into the esophagus and stomach. Final images demonstrate the NG tube coiled in the stomach. IMPRESSION: NG tube placement into the stomach.  Difficult placement. Electronically Signed   By: Franchot Gallo M.D.   On: 03/18/2018 07:04  Dg Swallowing Func-speech Pathology  Result Date: 03/23/2018 Objective Swallowing Evaluation: Type of Study: MBS-Modified Barium Swallow Study  Patient Details Name: ELZIE KNISLEY MRN: [id number] Date of Birth: [birth year] Today's Date: 03/23/2018 Time: SLP Start Time (ACUTE ONLY): 0845 -SLP Stop Time (ACUTE ONLY): 0912 SLP Time Calculation (min) (ACUTE ONLY): 27 min Past Medical  History: Past Medical History: Diagnosis Date . Alcohol abuse  . Anemia due to GI blood loss 09/2011  microcytic.  transfused for Hgb 3.5, MCV in 50s.  . Aortic insufficiency  . Benign neoplasm of colon  . Black stools 08/21/2015 . Cachexia ([place])  . Chest pain 08/21/2015 . Dysphagia  . ED (erectile dysfunction)  . Edema  . GERD (gastroesophageal reflux disease)  . ILD (interstitial lung disease) ([place])  . Iron deficiency anemia, unspecified  . Pulmonary nodule  . Reflux esophagitis  . Smoker  . Tobacco abuse  . Weight loss, abnormal  Past Surgical History: Past Surgical History: Procedure Laterality Date . COLONOSCOPY  10/03/2011  Procedure: COLONOSCOPY;  Surgeon: Scarlette Shorts, MD;  Location: [place];  Service: Endoscopy;  Laterality: N/A; . COLONOSCOPY N/A 09/13/2017  Procedure: COLONOSCOPY;  Surgeon: Carol Ada, MD;  Location: [place];  Service: Endoscopy;  Laterality: N/A; . COLONOSCOPY WITH PROPOFOL N/A 08/22/2015  Procedure: COLONOSCOPY WITH PROPOFOL;  Surgeon: Wilford Corner, MD;  Location: [facility] ENDOSCOPY;  Service: Endoscopy;  Laterality: N/A; . ENTEROSCOPY N/A 09/13/2017  Procedure: ENTEROSCOPY;  Surgeon: Carol Ada, MD;  Location: [name] ENDOSCOPY;  Service: Endoscopy;  Laterality: N/A; . ESOPHAGOGASTRODUODENOSCOPY  10/02/2011  Procedure: ESOPHAGOGASTRODUODENOSCOPY (EGD);  Surgeon: Scarlette Shorts, MD;  Location: [name] ENDOS[facility];  Service: Endoscopy;  Laterality: N/A; . ESOPHAGOGASTRODUODENOSCOPY N/A 01/05/2015  Procedure: ESOPHAGOGASTRODUODENOSCOPY (EGD);  Surgeon: Inda Castle, MD;  Location: [place];  Service: Endoscopy;  Laterality: N/A; . ESOPHAGOGASTRODUODENOSCOPY (EGD) WITH PROPOFOL N/A 08/22/2015  Procedure: ESOPHAGOGASTRODUODENOSCOPY (EGD) WITH PROPOFOL;  Surgeon: Wilford Corner, MD;  Location: [name] ENDOSCOPY;  Service: Endoscopy;  Laterality: N/A; . ESOPHAGOGASTRODUODENOSCOPY (EGD) WITH PROPOFOL N/A 09/19/2016  Procedure: ESOPHAGOGASTRODUODENOSCOPY (EGD) WITH PROPOFOL;  Surgeon: Carol Ada, MD;   Location: [facility] ENDOSCOPY;  Service: Endoscopy;  Laterality: N/A; . GIVENS CAPSULE STUDY N/A 09/19/2016  Procedure: GIVENS CAPSULE STUDY;  Surgeon: Carol Ada, MD;  Location: [place];  Service: Endoscopy;  Laterality: N/A; . GIVENS CAPSULE STUDY N/A 09/11/2017  Procedure: GIVENS CAPSULE STUDY;  Surgeon: Carol Ada, MD;  Location: [place];  Service: Endoscopy;  Laterality: N/A; HPI: 62 yo male adm to [facility] with symptomatic anemia, PMH + for etoh use, cachexia, former smoker, Hemoglobin 2 upon admit.  Swallow eval ordered due to pt having more difficulty.  Pt cxr showed persistent prominence ? edema, no consolidative pna.  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  Dysphagia presents as decreased oral coordination resulting in piecemealing and delayed transiting.  Phayrngeal swallow characterized by decreased epiglottic deflection and laryngeal elevation resulting in residuals t/o pharynx.  Solids/puree residuals worse than liquids.  Chin tuck posture decreased accumulation of residuals with solids as well as consuming liquids.  He does NOT sense residuals however which increases his risk.  Pt did not cough or clear his throat during tested.  No aspiration despite cues to consume thin liquids sequentially with head neutral.  Pt piecemeals and swallows multiple times with each  bolus protecting his airway.  Therapeutic intervention including determining strategies to compensate for dysphagia and reinforcing using teach back and video screen.  Recommend continue soft/thin with precautions.  SLP Visit Diagnosis Dysphagia, oropharyngeal phase (R13.12) Attention and concentration deficit following -- Frontal lobe and executive function deficit  following -- Impact on safety and function Moderate aspiration risk   CH[facility] IP TREATMENT RECOMMENDATION 03/23/2018 Treatment Recommendations Therapy as outlined in treatment plan below   Prognosis 03/23/2018 Prognosis for Safe Diet Advancement Fair Barriers to Reach Goals Other (Comment) Barriers/Prognosis Comment -- [facility] IP DIET RECOMMENDATION 03/23/2018 SLP Diet Recommendations Dysphagia 3 (Mech soft) solids;Thin liquid Liquid Administration via Cup;Straw Medication Administration Whole meds with liquid Compensations Slow rate;Small sips/bites;Other (Comment);Chin tuck;Follow solids with liquid;Multiple dry swallows after each bite/sip Postural Changes Seated upright at 90 degrees;Remain semi-upright after after feeds/meals (Comment)   CHL IP OTHER RECOMMENDATIONS 03/23/2018 Recommended Consults -- Oral Care Recommendations Oral care BID Other Recommendations --   CHL IP FOLLOW UP RECOMMENDATIONS 03/23/2018 Follow up Recommendations (No Data)   CHL IP FREQUENCY AND DURATION 03/23/2018 Speech Therapy Frequency (ACUTE ONLY) min 1 x/week Treatment Duration 1 week      CHL IP ORAL PHASE 03/23/2018 Oral Phase Impaired Oral - Pudding Teaspoon -- Oral - Pudding Cup -- Oral - Honey Teaspoon -- Oral - Honey Cup -- Oral - Nectar Teaspoon -- Oral - Nectar Cup Weak lingual manipulation Oral - Nectar Straw -- Oral - Thin Teaspoon Weak lingual manipulation;Reduced posterior propulsion Oral - Thin Cup Weak lingual manipulation;Reduced posterior propulsion;Piecemeal swallowing Oral - Thin Straw Weak lingual manipulation;Reduced posterior propulsion;Piecemeal swallowing Oral - Puree Weak lingual manipulation;Reduced posterior propulsion;Piecemeal swallowing Oral - Mech Soft -- Oral - Regular Weak lingual manipulation;Reduced posterior propulsion;Impaired mastication;Delayed oral transit Oral - Multi-Consistency -- Oral - Pill Weak lingual manipulation;Reduced posterior propulsion;Delayed oral transit Oral Phase - Comment chin tuck posture  tested with pill with puree  CHL IP PHARYNGEAL PHASE 03/23/2018 Pharyngeal Phase Impaired Pharyngeal- Pudding Teaspoon -- Pharyngeal -- Pharyngeal- Pudding Cup -- Pharyngeal -- Pharyngeal- Honey Teaspoon -- Pharyngeal -- Pharyngeal- Honey Cup -- Pharyngeal -- Pharyngeal- Nectar Teaspoon -- Pharyngeal -- Pharyngeal- Nectar Cup Pharyngeal residue - pyriform;Pharyngeal residue - valleculae Pharyngeal -- Pharyngeal- Nectar Straw -- Pharyngeal -- Pharyngeal- Thin Teaspoon Pharyngeal residue - valleculae;Pharyngeal residue - pyriform Pharyngeal -- Pharyngeal- Thin Cup Pharyngeal residue - valleculae;Pharyngeal residue - pyriform Pharyngeal -- Pharyngeal- Thin Straw Pharyngeal residue - valleculae;Pharyngeal residue - pyriform Pharyngeal -- Pharyngeal- Puree Reduced tongue base retraction;Pharyngeal residue - valleculae;Pharyngeal residue - posterior pharnyx Pharyngeal -- Pharyngeal- Mechanical Soft -- Pharyngeal -- Pharyngeal- Regular Reduced tongue base retraction;Pharyngeal residue - valleculae Pharyngeal -- Pharyngeal- Multi-consistency -- Pharyngeal -- Pharyngeal- Pill Reduced tongue base retraction;Pharyngeal residue - valleculae Pharyngeal -- Pharyngeal Comment no aspiration or penetration despite pt being cued to sequentially swallow thin liquids sequentially  CHL IP CERVICAL ESOPHAGEAL PHASE 03/23/2018 Cervical Esophageal Phase Impaired Pudding Teaspoon -- Pudding Cup -- Honey Teaspoon -- Honey Cup -- Nectar Teaspoon -- Nectar Cup -- Nectar Straw -- Thin Teaspoon -- Thin Cup -- Thin Straw -- Puree -- Mechanical Soft -- Regular -- Multi-consistency -- Pill -- Cervical Esophageal Comment barium tablet given with pudding appeared to lodge at distal esophagus without pt awareness, thin barium did not clear it however water intake faciliated clearance, radiologist not present to confirm findings, again pt not sensate to residuals [name], MS [name] SLP [phone] No flowsheet data found. [name] 03/23/2018,  4:18 PM  [place] Ekg Site Rite  Result Date: 03/19/2018 If Site Rite image not attached, placement could not be confirmed due to current cardiac rhythm.    Discharge Exam: Vitals:   04/03/18 2111 04/04/18 0604  BP: (!) 100/53 (!) 94/49  Pulse: 93 98  Resp: 18 18  Temp: 99.1 F (37.3 C) 98 F (36.7 C)  SpO2: 100% 92%   Vitals:   04/03/18 1152 04/03/18 2111 04/04/18 0254 04/04/18 0604  BP: (!) 102/59 (!) 100/53  (!) 94/49  Pulse: 96 93  98  Resp: 16 18  18   Temp: 98.6 F (37 C) 99.1 F (37.3 C)  98 F (36.7 C)  TempSrc: Oral Oral  Oral  SpO2: 100% 100%  92%  Weight:   51.7 kg (113 lb 15.7 oz)   Height:        General: Lethargic, disoriented    The results of significant diagnostics from this hospitalization (including imaging, microbiology, ancillary and laboratory) are listed below for reference.     Microbiology: No results found for this or any previous visit (from the past 240 hour(s)).   Labs: BNP (last 3 results) No results for input(s): BNP in the last 8760 hours. Basic Metabolic Panel: Recent Labs  Lab 03/29/18 0500 03/30/18 0620 03/31/18 0644 04/01/18 0437 04/02/18 0500  NA 134* 134* 133* 135 133*  K 3.2* 3.4* 3.5 3.2* 2.9*  CL 107 107 108 109 109  CO2 21* 21* 18* 21* 21*  GLUCOSE 76 80 75 75 81  BUN <5* <5* <5* <5* <5*  CREATININE <0.30* <0.30* <0.30* <0.30* 0.30*  CALCIUM 7.5* 7.5* 7.4* 7.5* 7.1*  MG 1.4* 1.5* 1.6* 1.5*  --    Liver Function Tests: Recent Labs  Lab 04/02/18 0500  AST 37  ALT 23  ALKPHOS 93  BILITOT 6.9*  PROT 4.6*  ALBUMIN 1.4*   No results for input(s): LIPASE, AMYLASE in the last 168 hours. Recent Labs  Lab 04/01/18 1112 04/02/18 0500  AMMONIA 34 44*   CBC: Recent Labs  Lab 03/29/18 0500 03/30/18 0620 03/31/18 0644 04/01/18 0437 04/02/18 0500  WBC 8.2 8.2 9.8 9.6 9.2  NEUTROABS  --   --   --  7.1  --   HGB 8.4* 8.3* 8.3* 8.5* 8.1*  HCT 25.5* 25.9* 25.4* 25.7* 24.6*  MCV 83.9 85.5 84.9 84.3  84.8  PLT 127* 111* 115* 114* 134*   Cardiac Enzymes: No results for input(s): CKTOTAL, CKMB, CKMBINDEX, TROPONINI in the last 168 hours. BNP: Invalid input(s): POCBNP CBG: Recent Labs  Lab 04/03/18 1558 04/03/18 2000 04/03/18 2359 04/04/18 0417 04/04/18 0734  GLUCAP 71 64* 68* 67* 80   D-Dimer No results for input(s): DDIMER in the last 72 hours. Hgb A1c No results for input(s): HGBA1C in the last 72 hours. Lipid Profile No results for input(s): CHOL, HDL, LDLCALC, TRIG, CHOLHDL, LDLDIRECT in the last 72 hours. Thyroid function studies No results for input(s): TSH, T4TOTAL, T3FREE, THYROIDAB in the last 72 hours.  Invalid input(s): FREET3 Anemia work up No results for input(s): VITAMINB12, FOLATE, FERRITIN, TIBC, IRON, RETICCTPCT in the last 72 hours. Urinalysis    Component Value Date/Time   COLORURINE YELLOW 03/14/2018 1947   APPEARANCEUR CLEAR 03/14/2018 1947   LABSPEC 1.009 03/14/2018 1947   PHURINE 5.0 03/14/2018 1947   GLUCOSEU NEGATIVE 03/14/2018 1947   HGBUR SMALL (A) 03/14/2018 [place] NEGATIVE 03/14/2018 1947   KETONESUR 20 (A) 03/14/2018 1947   PROTEINUR NEGATIVE 03/14/2018 1947   NITRITE NEGATIVE  03/14/2018 [place] 03/14/2018 1947   Sepsis Labs Invalid input(s): PROCALCITONIN,  WBC,  LACTICIDVEN Microbiology No results found for this or any previous visit (from the past 240 hour(s)).   Time coordinating discharge: 32 minutes  SIGNED:  Chipper Oman, MD  [facility] 04/04/2018, 11:44 AM  Pager please text page via  [email or web address]  Note - This record has been created using [employer]. Chart creation errors have been sought, but may not always have been located. Such creation errors do not reflect on the standard of medical care.

## 2018-04-04 NOTE — Progress Notes (Signed)
[facility] 04/04/2018 @ 1101 AM   Met with James [name]) patient's spouse and niece to confirm interest and explain services. Wife agreeable to transfer today. CSW Joelene Millin) aware. Registration paper work completed. Dr. Orpah Melter to assume care per family request. Please fax discharge summary to [phone]. RN please call report to [phone]. Please arrange transport for patient to arrive as early in day as possible.  Thank you.  Raina Mina, RN, BSN [facility] Liaison [phone]  [facility] Liaison are on AMION.

## 2018-04-08 DEATH — deceased

## 2019-03-23 IMAGING — CT CT HEAD W/O CM
3 of 7 series · 15 of 47 positions shown, 18 images · non-contrast
Comparison: CT scan of March 14, 2018.

CLINICAL DATA: Altered level of consciousness.

EXAM:
CT HEAD WITHOUT CONTRAST
TECHNIQUE: Contiguous axial images were obtained from the base of the skull
through the vertex without intravenous contrast.

[Series 5: head wo · axial · 0.48mm/px · z∈[+1272,+1392]mm · 9 of 31 slices shown, 12 images]
[im 4/31  brain]
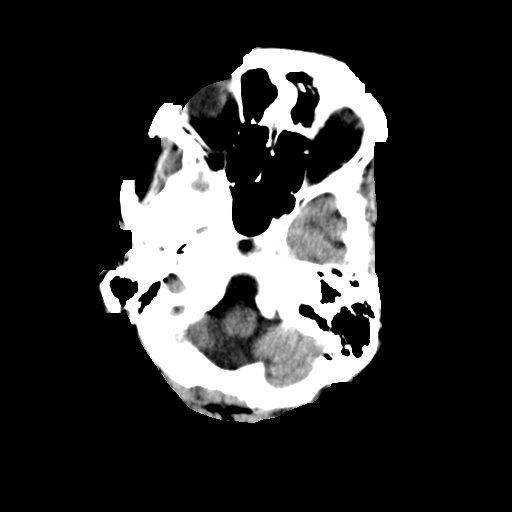
[im 4/31  bone]
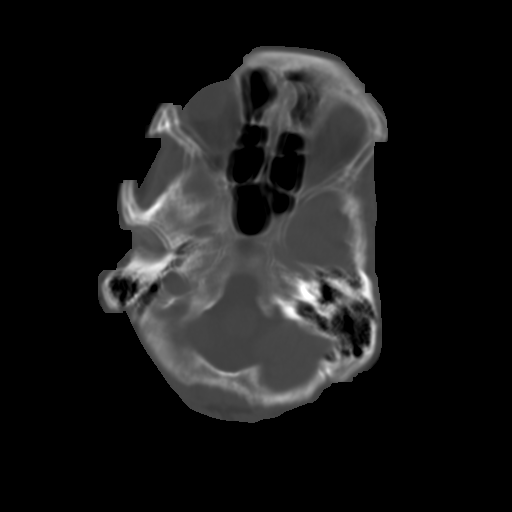
[im 7/31  brain]
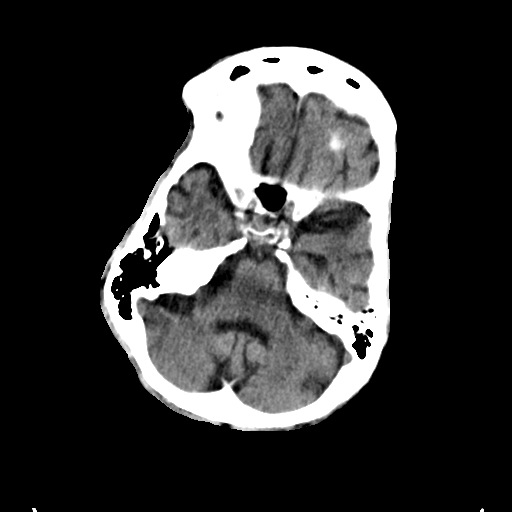
[im 10/31  brain]
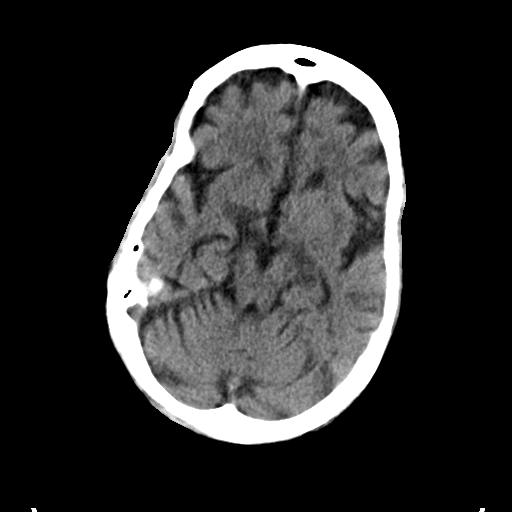
[im 13/31  brain]
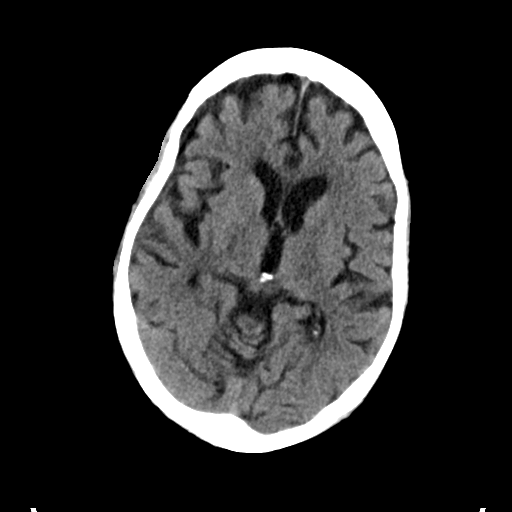
[im 16/31  brain]
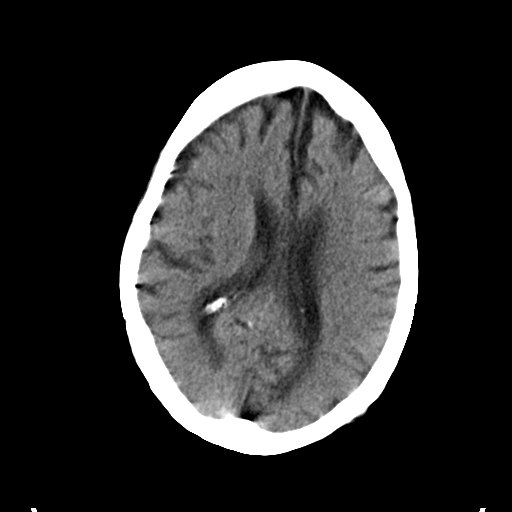
[im 16/31  bone]
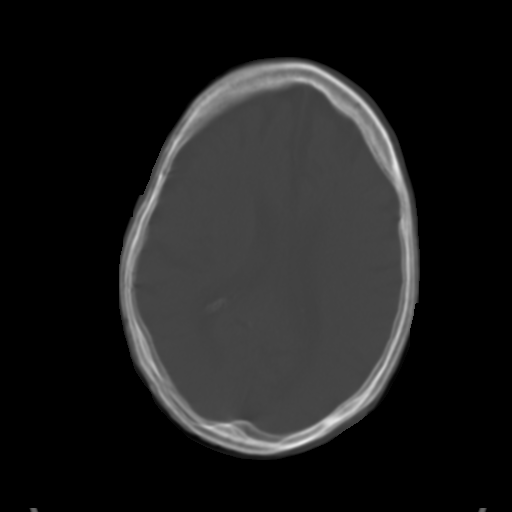
[im 19/31  brain]
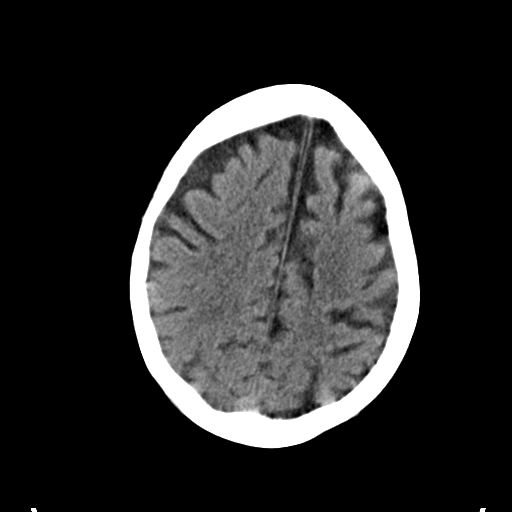
[im 22/31  brain]
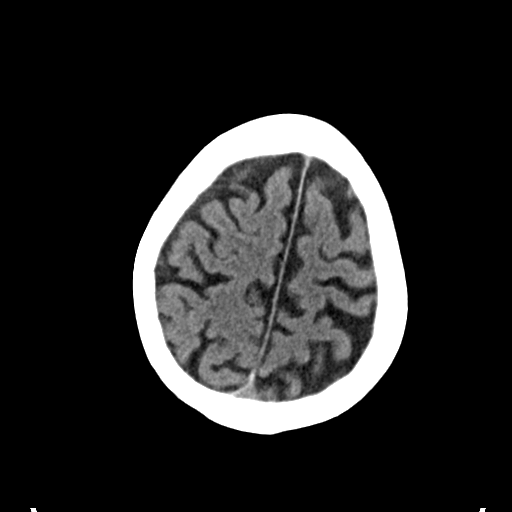
[im 25/31  brain]
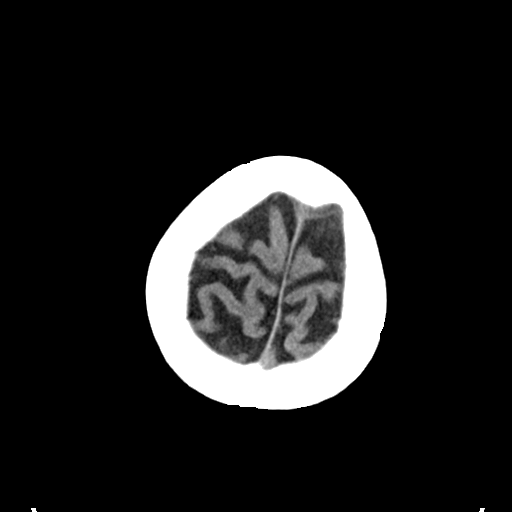
[im 28/31  brain]
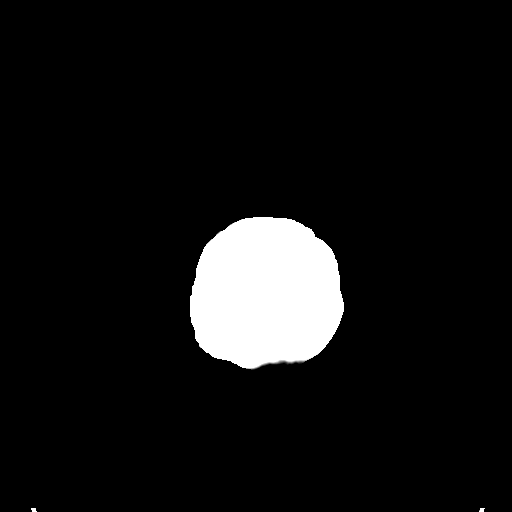
[im 28/31  bone]
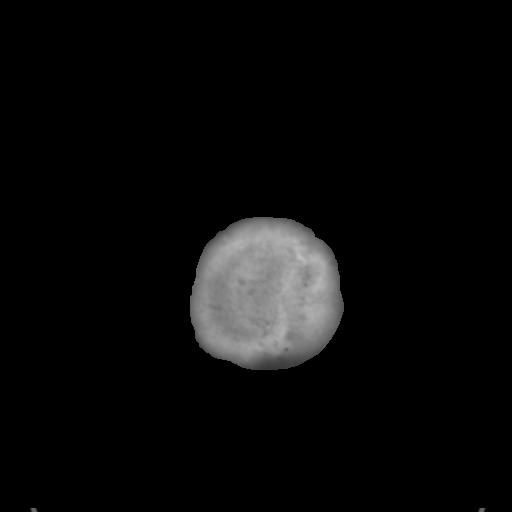

[Series 6: coronal soft tissue · coronal · 0.29mm/px · 3 of 67 slices shown]
[im 17/67  brain]
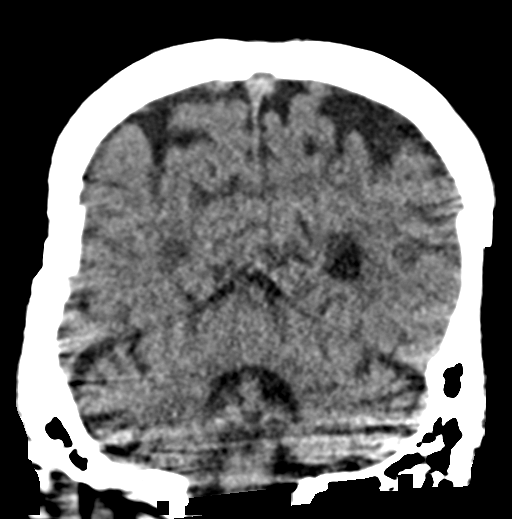
[im 34/67  brain]
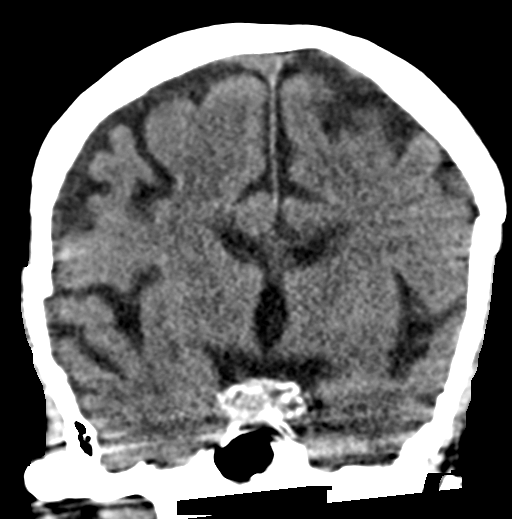
[im 50/67  brain]
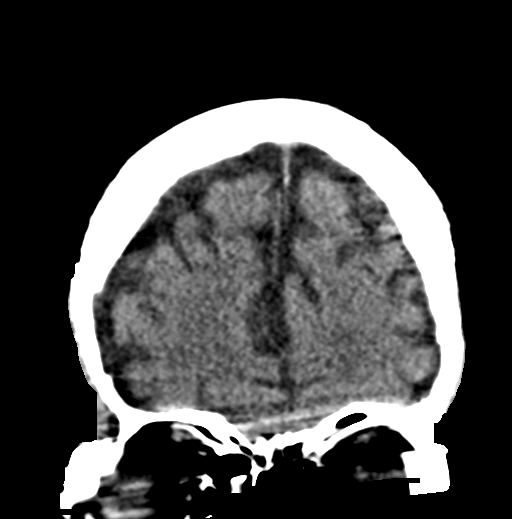

[Series 7: sagittal soft tissue · sagittal · 0.30mm/px · 3 of 47 slices shown]
[im 13/47  brain]
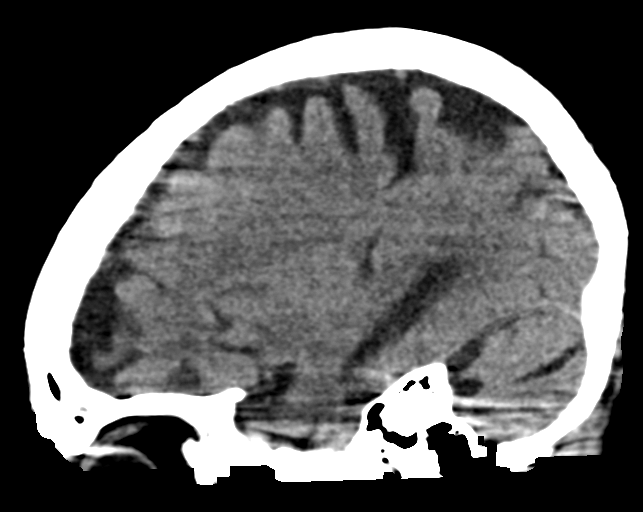
[im 26/47  brain]
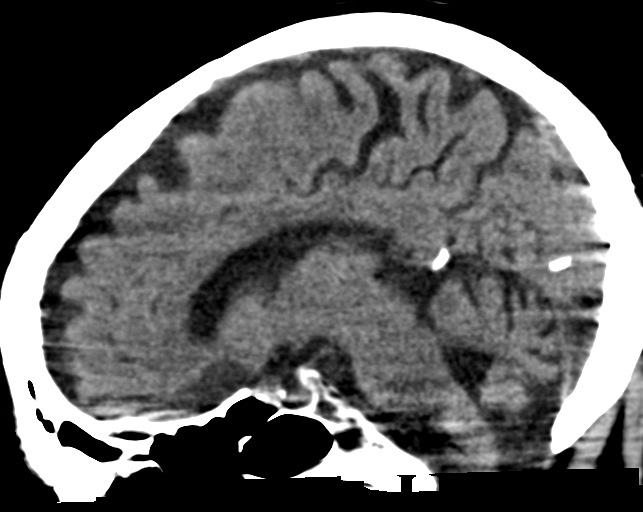
[im 39/47  brain]
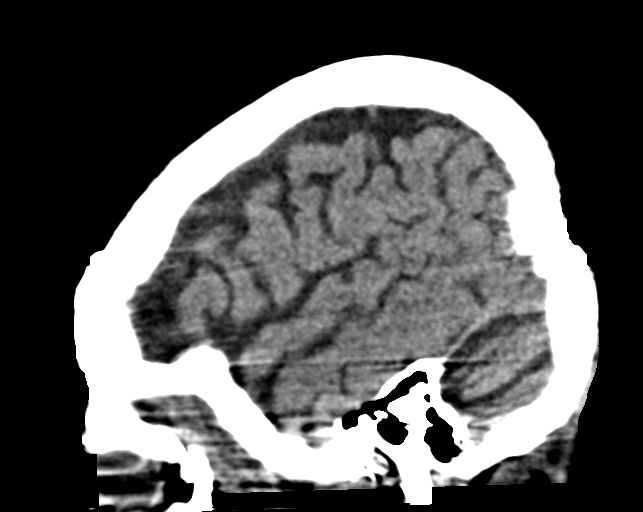

[15 of 47 positions shown; findings below may reference images not displayed]

FINDINGS: Brain: Mild diffuse cortical atrophy is noted. Mild chronic ischemic
white matter disease is noted. No mass effect or midline shift is
noted. Ventricular size is within normal limits. There is no
evidence of mass lesion, hemorrhage or acute infarction.

Vascular: No hyperdense vessel or unexpected calcification.

Skull: Normal. Negative for fracture or focal lesion.

Sinuses/Orbits: No acute finding.

Other: None.
IMPRESSION: Mild diffuse cortical atrophy. Mild chronic ischemic white matter
disease. No acute intracranial abnormality seen.
